# Patient Record
Sex: Female | Born: 1955
Health system: Southern US, Community
[De-identification: ages and names within clinical notes are randomized; demographics above are authoritative.]

## PROBLEM LIST (undated history)

## (undated) DIAGNOSIS — C50919 Malignant neoplasm of unspecified site of unspecified female breast: Secondary | ICD-10-CM

## (undated) DIAGNOSIS — K219 Gastro-esophageal reflux disease without esophagitis: Secondary | ICD-10-CM

## (undated) DIAGNOSIS — M5030 Other cervical disc degeneration, unspecified cervical region: Secondary | ICD-10-CM

## (undated) DIAGNOSIS — R251 Tremor, unspecified: Secondary | ICD-10-CM

## (undated) DIAGNOSIS — R0600 Dyspnea, unspecified: Secondary | ICD-10-CM

## (undated) DIAGNOSIS — Z972 Presence of dental prosthetic device (complete) (partial): Secondary | ICD-10-CM

## (undated) DIAGNOSIS — I499 Cardiac arrhythmia, unspecified: Secondary | ICD-10-CM

## (undated) DIAGNOSIS — E7800 Pure hypercholesterolemia, unspecified: Secondary | ICD-10-CM

## (undated) DIAGNOSIS — F32A Depression, unspecified: Secondary | ICD-10-CM

## (undated) DIAGNOSIS — F329 Major depressive disorder, single episode, unspecified: Secondary | ICD-10-CM

## (undated) DIAGNOSIS — F419 Anxiety disorder, unspecified: Secondary | ICD-10-CM

## (undated) DIAGNOSIS — Z8719 Personal history of other diseases of the digestive system: Secondary | ICD-10-CM

## (undated) DIAGNOSIS — G709 Myoneural disorder, unspecified: Secondary | ICD-10-CM

## (undated) DIAGNOSIS — R112 Nausea with vomiting, unspecified: Secondary | ICD-10-CM

## (undated) DIAGNOSIS — Z9889 Other specified postprocedural states: Secondary | ICD-10-CM

## (undated) DIAGNOSIS — I10 Essential (primary) hypertension: Secondary | ICD-10-CM

## (undated) DIAGNOSIS — C801 Malignant (primary) neoplasm, unspecified: Secondary | ICD-10-CM

## (undated) DIAGNOSIS — R519 Headache, unspecified: Secondary | ICD-10-CM

## (undated) DIAGNOSIS — M1990 Unspecified osteoarthritis, unspecified site: Secondary | ICD-10-CM

## (undated) DIAGNOSIS — E039 Hypothyroidism, unspecified: Secondary | ICD-10-CM

## (undated) DIAGNOSIS — D649 Anemia, unspecified: Secondary | ICD-10-CM

## (undated) HISTORY — PX: ABDOMINAL HYSTERECTOMY: SHX81

## (undated) HISTORY — PX: OTHER SURGICAL HISTORY: SHX169

## (undated) HISTORY — DX: Tremor, unspecified: R25.1

## (undated) HISTORY — DX: Anxiety disorder, unspecified: F41.9

## (undated) HISTORY — PX: WISDOM TOOTH EXTRACTION: SHX21

## (undated) HISTORY — PX: UPPER GI ENDOSCOPY: SHX6162

## (undated) HISTORY — PX: CARPAL TUNNEL RELEASE: SHX101

## (undated) HISTORY — DX: Other cervical disc degeneration, unspecified cervical region: M50.30

## (undated) HISTORY — PX: COLONOSCOPY: SHX174

## (undated) HISTORY — PX: EYE SURGERY: SHX253

---
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
## 1998-05-21 ENCOUNTER — Ambulatory Visit (HOSPITAL_COMMUNITY): Admission: RE | Admit: 1998-05-21 | Discharge: 1998-05-21 | Payer: Self-pay | Admitting: Gastroenterology

## 2001-05-02 ENCOUNTER — Ambulatory Visit (HOSPITAL_COMMUNITY): Admission: RE | Admit: 2001-05-02 | Discharge: 2001-05-02 | Payer: Self-pay | Admitting: *Deleted

## 2001-12-01 ENCOUNTER — Ambulatory Visit (HOSPITAL_COMMUNITY): Admission: RE | Admit: 2001-12-01 | Discharge: 2001-12-01 | Payer: Self-pay | Admitting: Gastroenterology

## 2003-11-24 HISTORY — PX: OTHER SURGICAL HISTORY: SHX169

## 2003-12-18 ENCOUNTER — Encounter: Admission: RE | Admit: 2003-12-18 | Discharge: 2003-12-18 | Payer: Self-pay | Admitting: Endocrinology

## 2004-04-01 ENCOUNTER — Encounter: Admission: RE | Admit: 2004-04-01 | Discharge: 2004-04-01 | Payer: Self-pay | Admitting: Specialist

## 2004-05-05 ENCOUNTER — Encounter: Admission: RE | Admit: 2004-05-05 | Discharge: 2004-05-05 | Payer: Self-pay | Admitting: Specialist

## 2004-06-13 ENCOUNTER — Encounter: Admission: RE | Admit: 2004-06-13 | Discharge: 2004-06-13 | Payer: Self-pay | Admitting: Specialist

## 2004-08-08 ENCOUNTER — Observation Stay (HOSPITAL_COMMUNITY): Admission: RE | Admit: 2004-08-08 | Discharge: 2004-08-09 | Payer: Self-pay | Admitting: Specialist

## 2004-08-08 ENCOUNTER — Encounter (INDEPENDENT_AMBULATORY_CARE_PROVIDER_SITE_OTHER): Payer: Self-pay | Admitting: Specialist

## 2005-04-02 ENCOUNTER — Encounter: Admission: RE | Admit: 2005-04-02 | Discharge: 2005-04-02 | Payer: Self-pay | Admitting: Specialist

## 2005-09-08 ENCOUNTER — Encounter: Admission: RE | Admit: 2005-09-08 | Discharge: 2005-09-08 | Payer: Self-pay | Admitting: Gastroenterology

## 2006-01-14 ENCOUNTER — Emergency Department (HOSPITAL_COMMUNITY): Admission: EM | Admit: 2006-01-14 | Discharge: 2006-01-14 | Payer: Self-pay | Admitting: Family Medicine

## 2006-09-28 ENCOUNTER — Encounter: Admission: RE | Admit: 2006-09-28 | Discharge: 2006-09-28 | Payer: Self-pay | Admitting: Specialist

## 2006-11-22 ENCOUNTER — Encounter: Admission: RE | Admit: 2006-11-22 | Discharge: 2006-11-22 | Payer: Self-pay | Admitting: Specialist

## 2006-11-23 HISTORY — PX: OTHER SURGICAL HISTORY: SHX169

## 2007-06-24 ENCOUNTER — Ambulatory Visit (HOSPITAL_BASED_OUTPATIENT_CLINIC_OR_DEPARTMENT_OTHER): Admission: RE | Admit: 2007-06-24 | Discharge: 2007-06-24 | Payer: Self-pay | Admitting: Specialist

## 2007-09-30 ENCOUNTER — Inpatient Hospital Stay (HOSPITAL_COMMUNITY): Admission: RE | Admit: 2007-09-30 | Discharge: 2007-10-04 | Payer: Self-pay | Admitting: Specialist

## 2008-07-04 ENCOUNTER — Encounter: Payer: Self-pay | Admitting: Pulmonary Disease

## 2008-07-26 ENCOUNTER — Ambulatory Visit: Payer: Self-pay | Admitting: Pulmonary Disease

## 2008-07-26 DIAGNOSIS — E039 Hypothyroidism, unspecified: Secondary | ICD-10-CM | POA: Insufficient documentation

## 2008-07-26 DIAGNOSIS — I10 Essential (primary) hypertension: Secondary | ICD-10-CM | POA: Insufficient documentation

## 2008-07-26 DIAGNOSIS — Z8669 Personal history of other diseases of the nervous system and sense organs: Secondary | ICD-10-CM | POA: Insufficient documentation

## 2008-07-26 DIAGNOSIS — R0602 Shortness of breath: Secondary | ICD-10-CM | POA: Insufficient documentation

## 2008-07-26 DIAGNOSIS — E785 Hyperlipidemia, unspecified: Secondary | ICD-10-CM | POA: Insufficient documentation

## 2008-08-03 ENCOUNTER — Ambulatory Visit: Payer: Self-pay | Admitting: Pulmonary Disease

## 2008-08-15 ENCOUNTER — Telehealth: Payer: Self-pay | Admitting: Pulmonary Disease

## 2008-08-28 ENCOUNTER — Ambulatory Visit: Payer: Self-pay

## 2008-08-28 ENCOUNTER — Encounter: Payer: Self-pay | Admitting: Pulmonary Disease

## 2008-09-03 ENCOUNTER — Ambulatory Visit: Payer: Self-pay | Admitting: Pulmonary Disease

## 2009-03-05 ENCOUNTER — Ambulatory Visit (HOSPITAL_COMMUNITY): Admission: RE | Admit: 2009-03-05 | Discharge: 2009-03-05 | Payer: Self-pay | Admitting: Specialist

## 2009-05-03 ENCOUNTER — Ambulatory Visit (HOSPITAL_BASED_OUTPATIENT_CLINIC_OR_DEPARTMENT_OTHER): Admission: RE | Admit: 2009-05-03 | Discharge: 2009-05-03 | Payer: Self-pay | Admitting: Specialist

## 2010-07-15 ENCOUNTER — Ambulatory Visit: Payer: Self-pay | Admitting: Cardiology

## 2010-07-16 ENCOUNTER — Encounter: Admission: RE | Admit: 2010-07-16 | Discharge: 2010-07-16 | Payer: Self-pay | Admitting: Cardiology

## 2010-07-16 ENCOUNTER — Ambulatory Visit: Payer: Self-pay | Admitting: Cardiology

## 2010-07-16 ENCOUNTER — Ambulatory Visit (HOSPITAL_COMMUNITY): Admission: RE | Admit: 2010-07-16 | Discharge: 2010-07-16 | Payer: Self-pay | Admitting: Cardiology

## 2010-11-18 ENCOUNTER — Ambulatory Visit
Admission: RE | Admit: 2010-11-18 | Discharge: 2010-11-18 | Payer: Self-pay | Source: Home / Self Care | Attending: Specialist | Admitting: Specialist

## 2010-12-14 ENCOUNTER — Encounter: Payer: Self-pay | Admitting: Gastroenterology

## 2010-12-23 NOTE — Assessment & Plan Note (Signed)
Summary: ov to discuss test results/mg   Referred by:  Dr. Adrian Prince PCP:  Dr. Adrian Prince  Chief Complaint:  Follow-up on ECHO and PFT.Marland Kitchen  History of Present Illness: The pt comes in today for f/u of her recent studies for evaluation of dyspnea.  Her pfts revealed no obstruction, mild restriction that I suspect is due to her obesity, and a decrease in dlco that corrected with alveolar volume adjustment.  Her cxr did show cardiomegaly, and mild increase in bronchovascular markings.  Her echo did show mild LAE, mild mitral regurgitation, normal lv fxn.  I have gone over the studies in great detail with her, and answered all of her questions.      Prior Medications Reviewed Using: Patient Recall  Current Allergies: No known allergies       Vital Signs:  Patient Profile:   55 Years Old Female Weight:      237.6 pounds O2 Sat:      98 % O2 treatment:    Room Air Temp:     98.1 degrees F oral Pulse rate:   74 / minute BP sitting:   114 / 80  (left arm)  Vitals Entered By: Michel Bickers CMA (September 03, 2008 1:37 PM)                 Physical Exam  General:     overweight female in nad      Impression & Recommendations:  Problem # 1:  DYSPNEA (ICD-786.05) I really cannot find anything obvious from a cardiopulmonary standpoint to explain her doe.  I really think it is likely related to her centripetal obesity and deconditioning.  At the same time, I have explained to the patient that testing does not always find the answer.  I have outlined two options.  The first is to take the next 2-3 mos and work on weight reduction and exercise program.  The other option is to proceed with formal cardiopulmonary stress testing.  The pt, at this point, would like to work on the former since nothing specific turned up on her testing.  However, she will contact us if things worsen or if she doesn't see improvement with appropriate behavioral changes.   Patient Instructions: 1)   work on weight loss and conditioning program.  Please call if your condition worsens, or if you do not see a response.   ]  Appended Document: Orders Update    Clinical Lists Changes  Orders: Added new Service order of Est. Patient Level III (16109) - Signed

## 2010-12-23 NOTE — Progress Notes (Signed)
Summary: LMTCBx2-results  Phone Note Call from Patient Call back at [phone]   Caller: Patient Call For: Mancel Lardizabal Summary of Call: results of pfts Initial call taken by: Rickard Patience,  August 15, 2008 10:19 AM  Follow-up for Phone Call        Pt had PFT's on 08-03-08 and would like results called to her as promised. Michel Bickers [facility]  August 15, 2008 10:29 AM  Additional Follow-up for Phone Call Additional follow up Details #1::        Let pt know that her breathing studies only show small lung volumes related to her weight.  Nothing to explain the degree of shortness of breath that she is describing.  I would like to do echo  to look at her mitral valve prolapse to see if there has been increased leaking??  Will set up echo with pcc who will call her. Additional Follow-up by: Barbaraann Share MD,  August 15, 2008 7:38 PM    Additional Follow-up for Phone Call Additional follow up Details #2::    Attempted TCB to notify pt of PFT results.  LMTCB with spouse. Cloyde Reams RN  August 16, 2008 9:25 AM  Attempted TCB.  Husband gave cell # [phone] attempted TC LMOM TCB. Cloyde Reams RN  August 16, 2008 5:11 PM   pt aware of results and knows she will be hearing from pcc soon Follow-up by: Philipp Deputy CMA,  August 17, 2008 9:53 AM

## 2010-12-23 NOTE — Assessment & Plan Note (Signed)
Summary: consult for dyspnea   Visit Type:  Initial Consult Referred by:  Dr. Adrian Prince PCP:  Dr. Adrian Prince  Chief Complaint:  SOB.  History of Present Illness: the patient is a 55 year old female who I've been asked to see for dyspnea. She is been experiencing dyspnea on exertion for the last 3-4 months, but does not feel that it is progressive in nature. She also has described some shortness of breath that can occur at rest with conversation. her shortness of breath is measured by dyspnea on exertion at approximately one block at a moderate pace. She will also get winded vacuuming one room of her house, or bringing groceries in from the car. She has an occasional cough which is primarily dry, but does not believe it is overly significant. She's not had significant lower extremity edema. The patient has never smoked, and denies childhood asthma. Overall, she is sedentary, and her weight has increased "only a few pounds" over the last one year. However, the patient admits that she never weighs herself. She denies any cardiac issues, and had a recent nuclear stress test last week that was apparently normal. She does have a history of mitral valve prolapse, but has not had a recent echocardiogram.     Current Allergies: No known allergies   Past Medical History:    Hyperlipidemia    Hypertension    Hypothyroidism    History of mitral valve prolapse  Past Surgical History:    Knee Arthroscopy    Total Knee Arthroplasty    Hysterectomy 1996    Neck and Back surgery 1994/2005       Family History:    Father had hx of heart dz  Social History:    Married, Actor    Never smoker    No ETOH   Risk Factors:  Tobacco use:  never   Review of Systems      See HPI   Vital Signs:  Patient Profile:   55 Years Old Female Weight:      224.50 pounds O2 Sat:      98 % O2 treatment:    Room Air Temp:     97.9 degrees F oral Pulse rate:   55 /  minute BP sitting:   130 / 82  (left arm)  Vitals Entered By: Vernie Murders (July 26, 2008 11:18 AM)                 Physical Exam  General:     obese female in no acute distress Eyes:     PERRLA and EOMI.   Nose:     patent without discharge Mouth:     clear Neck:     no JVD, thyromegaly, or lymphadenopathy. Lungs:     clear to auscultation Heart:     regular rate and rhythm, no MRG Abdomen:     soft and nontender, bowel sounds present Extremities:     no significant edema noted, pulses intact distally Neurologic:     alert and oriented, moves all 4 extremities.      Impression & Recommendations:  Problem # 1:  DYSPNEA (ICD-786.05) dyspnea of unknown eat origin. It is unclear whether the patient may have an occult cardiopulmonary issues, or whether this is related to her centripetal obesity and deconditioning. She does have a history of mitral valve prolapse, and I wonder if there has been increased leaking from this. I would like to start by getting  a plain chest x-ray, and also schedule her for full pulmonary function studies. I will see her back after the results are available.  Medications Added to Medication List This Visit: 1)  Bystolic 5 Mg Tabs (Nebivolol hcl) .Marland Kitchen.. 1 once daily   Patient Instructions: 1)  will schedule for breathing tests and cxr. 2)  will call with results.   ]

## 2010-12-23 NOTE — Miscellaneous (Signed)
Summary: Orders Update pft charges  Clinical Lists Changes  Orders: Added new Service order of Carbon Monoxide diffusing w/capacity (94720) - Signed Added new Service order of Lung Volumes (94240) - Signed Added new Service order of Spirometry (Pre & Post) (94060) - Signed 

## 2011-02-02 LAB — POCT I-STAT 4, (NA,K, GLUC, HGB,HCT)
Glucose, Bld: 100 mg/dL — ABNORMAL HIGH (ref 70–99)
HCT: 36 % (ref 36.0–46.0)
Hemoglobin: 12.2 g/dL (ref 12.0–15.0)
Potassium: 4.1 mEq/L (ref 3.5–5.1)
Sodium: 145 mEq/L (ref 135–145)

## 2011-02-03 ENCOUNTER — Other Ambulatory Visit: Payer: Self-pay | Admitting: Gastroenterology

## 2011-03-02 LAB — POCT I-STAT 4, (NA,K, GLUC, HGB,HCT)
Glucose, Bld: 96 mg/dL (ref 70–99)
HCT: 39 % (ref 36.0–46.0)
Hemoglobin: 13.3 g/dL (ref 12.0–15.0)
Potassium: 3.8 mEq/L (ref 3.5–5.1)
Sodium: 140 mEq/L (ref 135–145)

## 2011-04-07 NOTE — Op Note (Signed)
NAME:  Kathryn [name], TRICE NO.:  [id number]   MEDICAL RECORD NO.:  [id number]          PATIENT TYPE:  AMB   LOCATION:  [facility]                         FACILITY:  [facility]   PHYSICIAN:  Jene Every, M.D.    DATE OF BIRTH:  08/08/1956   DATE OF PROCEDURE:  06/24/2007  DATE OF DISCHARGE:                               OPERATIVE REPORT   PREOPERATIVE DIAGNOSIS:  Degenerative joint disease and degenerative  meniscus tear of the medial compartment, left knee.   POSTOPERATIVE DIAGNOSIS:  Degenerative joint disease and degenerative  meniscus tear of the medial compartment, left knee.   PROCEDURE PERFORMED:  Left knee arthroscopy, chondroplasty of the  patellofemoral sulcus, medial femoral condyle, lateral tibial plateau  and partial medial meniscectomy.   ANESTHESIA:  General.   SURGEON:  Jene Every, M.D.   ASSISTANT:  No assistant.   BRIEF HISTORY AND INDICATION:  This is a 55 year old with osteoarthritic  symptoms of the left knee.  Despite conservative treatment including  corticosteroid injection and activity modification, she had some  mechanical symptoms, indicating a possible meniscal pathology.  She was  indicated for arthroscopic debridement and discussed the risks and  benefits of the operation to include bleeding, infection, damage to  neurovascular structures, no change in symptoms, worsening symptoms,  need for repeat debridement and total knee arthroplasty in the future,  etc.   TECHNIQUE:  With the patient in supine position, after the induction of  adequate general anesthesia and 1 g of Kefzol, the left lower extremity  was prepped and draped in the usual sterile fashion.  A lateral  parapatellar portal and superomedial parapatellar portal were fashioned  with a #11 blade.  Ingress cannula was atraumatically placed.  Irrigant  was utilized to insufflate the joint.  Under direct visualization, a  medial parapatellar portal was fashioned with a #11 blade  after  localization with an 18-gauge needle, sparing the medial meniscus.  Inspection revealed extensive grade 3 changes of the patellofemoral  joint.  A shaver was introduced and utilized to perform a chondroplasty  of the patella and femoral sulcus.  There was normal tracking.  The  medial compartment revealed extensive grade 3 changes of the femoral  condyle.  There were loose cartilaginous flap tears which were debrided  with a 4.2 Cuda shaver.  As there was a degenerative tear of the  posterior third of the medial meniscus, a basket was introduced and  utilized to perform a partial medial meniscectomy to a stable base and  further contoured with a 4.2 Cuda shaver.  The remaining meniscus was  stable to probe palpation.  ACL had mild degenerative changes as well as  the PCL.  The lateral compartment revealed grade 3 changes of the tibial  plateau and the patella and of the medial femoral condyle.  Collene Mares was  introduced and utilized to perform a chondroplasty of the compartment  tear.  The meniscus was stable to probe palpation without tearing.  Gutters were unremarkable.  The knee was copiously lavaged and  reexamined the medial compartment; I felt that there was no specific  grade 4  changes to flexion and extension and visualization and probing  the femoral condyle and the tibial plateau.  Next then, the knee was  copiously lavaged and all instrumentation was removed.  Portals were  closed with 4-0 nylon simple sutures.  Marcaine 0.25% with epinephrine was infiltrated in the joint.  Wound was  dressed sterilely.  She was awoken without difficulty and transported to  the recovery room in satisfactory condition.   The patient tolerated the procedure well with no known complications.      Jene Every, M.D.  Electronically Signed     JB/MEDQ  D:  06/24/2007  T:  06/25/2007  Job:  [id number]

## 2011-04-07 NOTE — Op Note (Signed)
NAME:  Kathryn [name], KIEHN NO.:  [id number]   MEDICAL RECORD NO.:  [id number]          PATIENT TYPE:  AMB   LOCATION:  [facility]                         FACILITY:  [facility]   PHYSICIAN:  Jene Every, M.D.    DATE OF BIRTH:  07/19/1956   DATE OF PROCEDURE:  05/03/2009  DATE OF DISCHARGE:                               OPERATIVE REPORT   PREOPERATIVE DIAGNOSIS:  Cyclops lesion, right knee.   POSTOPERATIVE DIAGNOSIS:  Cyclops lesion, right knee.   PROCEDURE:  1. Examination under anesthesia.  2. Right knee arthroscopy.  3. Extensive debridement of scar tissue.  4. Synovectomy.   SURGEON:  Jene Every, M.D.   ANESTHESIA:  General.   BRIEF HISTORY/INDICATIONS:  This 55 year old, status post knee  replacement having locking, popping and giving.  Suspect a cyclops  lesion.  The patellofemoral joint x-ray showed no evidence of loosening.  There was no swelling.  She is indicated for arthroscopic evaluation and  debridement of the suspected cyclops lesion.  The risks and benefits  were discussion, including bleeding, infection, no change in symptoms,  worsening symptoms, need for repeat debridement or open debridement in  the future.   DESCRIPTION OF PROCEDURE:  With the patient in the supine position,  after being intubated and administered general anesthesia, and 2 grams  of Kefzol, the right lower extremity was prepped and draped in the usual  sterile fashion.  The superomedial portal was fashioned with a #11  blade.  The ingress cannula atraumatically placed, taking care not to  scratch the knee prosthesis.  A lateral and a medial parapatellar portal  were fashioned with a #11 blade.  This was in her pre-existing portal  markers.  Insertion of an arthroscopic camera, and a cannula for the  shaver.  Just prior to that, flexion and extension revealed some popping  in the subpatellar region.  Inspection of the suprapatellar pouch  revealed extensive fibrosis and scar  tissue. When I first introduced the  cannula, I got no fluid out of that.  There were plica noted there.  All  these were shaved with a 3.5 coude shaver.  The suprapatellar pouch, the  superior, medial and lateral aspects of the patella inferiorly, with  slight flexion and extension.  There were a couple of bands that seemed  to be inter-positioning between the prosthesis polyethylene insert and  the femoral component with flexion and extension.  This was debrided as  well.  The polyethylene insert was unremarkable.  I examined up inferior  to the patella.  Scar tissue was debrided and care was taken not to  injure the patellar tendon. I placed a #18 gauge needle beneath the  patellar tendon and stayed beneath that at all times.  The small lesion  in the inferior aspect seemed to be consistent with that, seen with a  cyclops-type lesion.  This was debrided.  Again meticulous care was  taken not to involve the patellar ligament.  After debridement flexion  and extension revealed no popping or crepitus noted.  The knee was  copiously irrigated.  We checked the gutters.  They  were unremarkable as  well.  All instrumentation was removed.  The portals were closed with #4-  0 nylon simple sutures.  Then 0.25% Marcaine with epinephrine was  infiltrated in the joint wounds.  The patient was dressed sterilely.   She was awoken without difficulty and was transported to the recovery  room in satisfactory condition.  The patient tolerated the procedure  well.  There were no complications.      Jene Every, M.D.  Electronically Signed     JB/MEDQ  D:  05/03/2009  T:  05/03/2009  Job:  [id number]

## 2011-04-07 NOTE — H&P (Signed)
NAME:  Kathryn [name], FARNER NO.:  [id number]   MEDICAL RECORD NO.:  [id number]         PATIENT TYPE:  LINP   LOCATION:                               FACILITY:  [facility]   PHYSICIAN:  Jene Every, M.D.    DATE OF BIRTH:  [birth year]   DATE OF ADMISSION:  09/30/2007  DATE OF DISCHARGE:                              HISTORY & PHYSICAL   CHIEF COMPLAINTS:  Right knee pain.   HISTORY:  Ms. Danko is well-known to our practice.  She has had a  longstanding history of off and on left knee pain.  She has undergone  multiple arthroscopies with short-term relief of her symptoms.  However,  over the past couple of months, she has noted worsening knee pain that  is disabling for her.  MRI study does show a complex tear of the  posterior horn of the medial meniscus as well as tricompartmental  degenerative changes.  We did treat the patient conservatively with  Hyalgan injections as well as a physical therapy, but unfortunately, she  had persistent pain, and it is felt at this time the patient will  benefit from a total knee arthroplasty.  The risks and benefits of this  were discussed with the patient.  She does wish to proceed.   MEDICAL HISTORY:  1. Gastroesophageal reflux disease.  2. Hypothyroidism.  3. Hypercholesterolemia.  4. Hypertension.   CURRENT MEDICATIONS:  1. Hydrocodone 5/325 p.r.n. pain.  2. Synthroid 125 mcg one p.o. daily.  3. Crestor 10 mg one p.o. daily.  4. Aciphex 20 mg one p.o. daily.  5. Bistolic 5 mg one p.o. daily.   ALLERGIES:  None.   PAST SURGICAL HISTORY:  1. Left knee arthroscopy times two.  2. Right knee arthroscopy in 2002.  3. Previous neck surgery in 1994.  4. Hysterectomy in 1996.  5. Right carpal tunnel release in 1998.  6. Left carpal tunnel release in 2000.  7. Lumbar decompression in 2005.   FAMILY HISTORY:  The patient's father is deceased at age 65 of coronary  artery disease.  Mother is 57.  She has history of diabetes as  well as  CVA.   SOCIAL HISTORY:  The patient is married.  She worse as a Print production planner.  She denies any tobacco or alcohol consumption.  She has two  children.  Husband will be her caregiver following surgery.  She lives  in a one-story home with three outdoor steps.  Primary care physician is  Dr. Adrian Prince.   REVIEW OF SYSTEMS:  GENERAL:  The patient denies any fever, chills,  night sweats or bleeding tendencies.  CNS: No blurred or double vision,  seizure, headache or paralysis.  RESPIRATORY: No shortness of breath,  productive cough or hemoptysis.  CARDIOVASCULAR:  No chest pain, angina  or orthopnea.  GU: No dysuria, hematuria or discharge.  GI: No nausea,  vomiting, diarrhea, constipation, melena or bloody stools.  MUSCULOSKELETAL:  As pertinent in the HPI.   PHYSICAL EXAMINATION:  VITAL SIGNS:  Pulse 70, respirations 12, BP  146/90 in the right arm.  GENERAL:  This well-nourished female seen  upright in no acute distress.  HEENT: Atraumatic, normocephalic.  Pupils  equal round and reactive to light.  EOMs intact.  NECK:  Supple.  No lymphadenopathy.  CHEST:  Clear to auscultation bilaterally.  No rhonchi, wheezes or  rales.  BREASTS/GU:  Not pertinent to HPI.  HEART:  Regular rate and rhythm without murmurs, gallops or rubs.  ABDOMEN:  Soft, nontender, nondistended.  Bowel sounds time four.  SKIN:  No rashes or lesions are noted.  LOWER EXTREMITY:  The patient has mild effusion in the right knee.  She  is tender along the medial joint line.  There is mild crepitus on exam.  She is also having pain in the left knee which shows mild diffusion with  tenderness in the medial joint line.   IMPRESSION:  End-stage tricompartmental arthritis, right knee.   PLAN:  The patient will be admitted to [facility] to undergo  right total knee arthroplasty.      Roma Schanz, P.A.      Jene Every, M.D.  Electronically Signed    CS/MEDQ  D:  09/23/2007   T:  09/23/2007  Job:  [id number]

## 2011-04-07 NOTE — Op Note (Signed)
NAMERISHIKA, MCCOLLOM NO.:  [id number]   MEDICAL RECORD NO.:  [id number]          PATIENT TYPE:  INP   LOCATION:  1616                         FACILITY:  [facility]   PHYSICIAN:  Jene Every, M.D.    DATE OF BIRTH:  26-Jul-1956   DATE OF PROCEDURE:  DATE OF DISCHARGE:                               OPERATIVE REPORT   PREOPERATIVE DIAGNOSIS:  Degenerative joint disease, right knee.   POSTOPERATIVE DIAGNOSIS:  Degenerative joint disease, right knee.   PROCEDURE:  Right total knee arthroplasty, DePuy rotating platform, 3  femur, 3 tibia, 12.5-mm insert, 35 patella.   ANESTHESIA:  General.   SURGEON:  Jene Every, M.D.   ASSISTANT:  Roma Schanz, P.A.   BRIEF HISTORY AND INDICATIONS:  This is a 55 year old female with end-  stage osteoarthrosis of the right knee status post multiple arthroscopic  debridements, persistent refractory pain.  Significant joint space  narrowing was noted medially and laterally.  Operative intervention was  indicated to replace the degenerated joint.  The risks and benefits were  discussed, including bleeding, infection, damage to neurovascular  structures, suboptimal range of motion, component failure, instability,  arthrofibrosis, need for manipulation and revision, DVT, PE, anesthetic  complications, etc.   TECHNIQUE:  With the patient in the supine position after induction of  adequate general anesthesia, 2 grams Kefzol, the right lower extremity  was prepped and draped and exsanguinated in the usual sterile fashion.  Thigh tourniquet inflated to 325 mmHg.   Midline anterior incision was made over the knee.  The subcutaneous  tissue was dissected.  Electrocautery was used to supplement hemostasis.  Median patellar arthrotomy was performed.  Clear synovial fluid was  evacuated.  The patella was everted and the knee was flexed.  Tricompartmental severe osteoporosis was noted with significant  osteophytic spurring.  It was  removed with the rongeur.  Step drill was  utilized to enter the femoral canal.  This was irrigated.  The T-cannula  was introduced.  A 5-degree right intramedullary guide was inserted.  The distal femoral cutting block was then applied.  10 mm was chosen.  The soft tissues were well-protected.  The oscillating saw was utilized  to perform the distal femoral cut.  We then sized the femur, exposing  the anterior cortex.  This sized to a 3.  This was then secured, pinned,  and the distal femoral cutting block was applied.  We used the  appropriate 3-degree rotation.  With this clamp in a curved  _crego_________, protecting the posterior elements, we performed the  anterior and posterior cuts and then the chamfer cuts.  The [facility] rongeur  was utilized to remove the residual osteophytes.  Prior to this, a  remnant of the ACL was removed and the portion of the anterolateral  remaining menisci.  Following this, we flexed the and subluxed the tibia  with a Crego and removed the remaining portion of the medial and lateral  menisci, cauterizing the geniculate.  There was extreme attenuation of  the popliteus tendon.  Next, the tibia was sized basically to a 3.  The  external  alignment guide was placed just medial to the tibial tubercle 4  mm off the defect which was laterally and parallel to the tibia and the  distal portion bisecting the ankle.  This was then pinned and again the  posterior elements well-protected with McHales.  We used an oscillating  saw to perform the tibial cut.  Next, the flexion/extension block was  evaluated.  I felt that it was optimal with the 12.5 mm spacer block in  extension and in flexion.  There was an equivalent gap noted and no  instability with varus valgus stressing at 90 or  at 0 degrees.  We then  returned to the tibia with the knee flexed.  We sized the tibia which  was optimal at the 3 in the appropriate rotation.  We tried a 4 which  seemed to be too large  and overhanging.  The 3 was then selected which  seemed to have good peripheral cortical fit in the appropriate rotation.  This was then pinned, and the central drill was utilized and then the  punch guide was utilized as well.  We then used the box cut guide for  the distal femur and lined it with the tibial tray as well.  She had a  slightly hypertrophic area on the medial femoral condyle.  We dissected  the notch in the intramedullary canal.  We pinned this, and then again  protecting the posterior elements using the small saw to perform the  anterior posterior cut, and then leaving the saw as protection, used an  oscillating saw to perform the cephalad caudad cut.  The block was then  removed.  This was rasped.  We put a trial femur in which fit  satisfactorily.  We put a trial insert in the tibia.  She had full  extension, full flexion.  She had no instability, no anterior drawer.  The patella was then everted and measured at 25 mm in thickness.  We  selected the 35.  We then used the patellar planing after removing  osteophytes from the patella and removed 8.5 mm thickness of the  patella.  The remaining thickness of the patella was 16.  We then placed  the plate, medializing the drill holes for the patella, drilling them.  We then trialed the patella and it was found to seat satisfactorily, and  with the other component, tracked appropriately with the tibia and the  femoral components.  A minor release of the lateral tissues was  performed.  Next, all of the trials were then removed and the posterior  area inspected.  There were small loose bodies noted in the posterior  aspect of the joint.  These were removed, approximately a centimeter in  diameter.  We irrigated the knee with pulsatile lavage.  Following this,  we placed down a clean drape, flexed the knee, and placed our McHale's,  and dried the bony surfaces.  Then, cement was mixed in the appropriate  fashion.  We then  injected it down to the tibia, pressing it into the  interstitium of the bone.  We placed the 3 tibial component and impacted  it in place, with redundant cement removed.  We placed the insert, and  then we placed the femoral component after cementing it, putting cement  on the runners and putting Gelfoam up into the intramedullary canal.  This was impacted in place and redundant cement removed.  We placed a  12.5 insert, inserted it and brought it out  into full extension, axial  load applied and redundant cement removed.  Until a curing of the cement  occurred, we clamped the patella with the patellar clamp, redundant  cement removed.  After appropriate curing of the cement, the insert was  then removed.  We removed all redundant cement with an osteotome and  pickups.  The wound copiously irrigated.  We then flexed the knee, used  a Crego, placed 12.5 permanent insert.  We had full extension, full  flexion, no evidence of instability in varus or valgus stressing at 0  and 30 degrees.  Good tracking of the patella was noted.  We placed a  Hemovac and brought it out through a lateral stab wound in the skin.  We  then copiously irrigated with pulsatile lavage.  We repaired the  parapatellar arthrotomy with #1 Vicryl interrupted figure-of-eight  sutures.  The subcutaneous tissue was reapproximated with 0 and 2-0  Vicryl simple sutures.  The skin was reapproximated with staples.  She  was closed in slight flexion.  After closure, we held the thigh up.  She  had 90 degrees against gravity.  Again, full extension, full flexion,  negative anterior drawer with knee in flexion.  The wound was dressed  sterilely and secured with an Ace bandage.  After 2 hours, the  tourniquet was released.  There was adequate vascularization of the  lower extremity appreciated.   The patient tolerated the procedure well.  There were no complications.      Jene Every, M.D.  Electronically Signed      JB/MEDQ  D:  09/30/2007  T:  09/30/2007  Job:  [id number]

## 2011-04-10 NOTE — Op Note (Signed)
NAME:  Kathryn [name], UNREIN NO.:  [id number]   MEDICAL RECORD NO.:  [id number]                   PATIENT TYPE:  AMB   LOCATION:  DAY                                  FACILITY:  [facility]   PHYSICIAN:  Jene Every, M.D.                 DATE OF BIRTH:  [birth year]   DATE OF PROCEDURE:  08/08/2004  DATE OF DISCHARGE:                                 OPERATIVE REPORT   PREOPERATIVE DIAGNOSIS:  Spinal stenosis, L4-5, left.   POSTOPERATIVE DIAGNOSES:  1.  Spinal stenosis, L4-5, left.  2.  Synovial cyst, lipoma, left L4-5 facet.   PROCEDURE PERFORMED:  Lateral recess decompression, partial medial  hemifacetectomy, L4-5.  Excision of synovial cyst, lipoma.  Foraminotomy of  L4-5.   ANESTHESIA:  General.   ASSISTANT:  Roma Schanz, P.A.   BRIEF HISTORY/INDICATIONS:  This is a 55 year old with left lower extremity  radicular pain in the L5 nerve root distribution and positive neural tension  signs and EHL weakness.  MRI indicating mild degenerative changes at L4-5  and L5-S1.  Due to the persistence of her symptoms, we obtained a flexion  and extension lateral CT myelogram which demonstrated in the lateral  position, L5 nerve root cut-off.  Operative intervention is indicated for  decompression of that lateral recess.  Risks and benefits were discussed,  including bleeding, infection, damage to vascular structures, CSF leakage,  epidural fibrosis, adjacent segment disease, need for fusion in the future,  no change in symptoms, etc.   TECHNIQUE:  After being placed in the supine position, after adequate  general anesthesia and 1 gm of Kefzol, she was placed prone on the Andrews  frame while all bony prominences were well padded.  The lumbar area was  prepped and draped in the usual sterile fashion.  An 18 gauge spinal needle  was utilized to localize the 4-5 interspace and confirmed with x-ray.  An  incision was made from the spinous process of L4-5.   Subcutaneous tissue was  dissected.  Electrocautery was utilized to achieve hemostasis.  The dorsal  lumbar fascia was identified and divided in line with the skin incision.  The paraspinous muscle was elevated from the lamina at L4-5.  The McCullough  retractor was placed.  The operating microscope was draped and brought into  the surgical field.  Noting first at the L4-5 facet was a fairly large mass  containing a synovial cyst and lipoma, this was removed with pituitary,  incorporating a good portion of the facet.  This was sent to pathology for  appropriate analysis.  Next, detached the ligamentum flavum on the cephalad  edge of 4 and the caudad edge of 5, utilizing a 2-3 mm Kerrison.  We  detached the cephalad edge of the ligamentum, utilizing this hemilaminotomy.  The caudad edge was performed as well, noting the lateral recess was  ligamentum flavum hypertrophy and a spur off the  medial portion of the  superior articulating facet of 5.  This was decompressed with a 2 mm and a 3  mm Kerrison, performing lateral recess decompression and a foraminotomy.  Bipolar electrocautery was then utilized to achieve hemostasis.  This is  felt to be the intervening pathology.  After the lateral recess was  decompressed and then mobilizing the L5 nerve root medially, there was an  extensive epidural venous plexus, and electrocautery was then utilized to  achieve hemostasis.  Full examination of the disk revealed no evidence of  herniated material.  I checked also beneath the thecal sac and as well in  the axilla of the 5 nerve root.  A probe was then placed in the foramen at 4  and 5 found to be widely patent following decompression.  There was good  excursion of the 5 root medial to the pedicle without compression.   The wound was then copiously irrigated with inspection being done.  No CSF  leakage or active bleeding.  Thrombin-soaked Gelfoam was placed in the  laminectomy defect.  Pelvic  retractors were removed.  Paraspinous muscles  were inspected.  No evidence of active bleeding.  Dorsal lumbar fascia  was  reapproximated with 0 Vicryl interrupted figure-of-eight suture.  Subcutaneous tissue was reapproximated with 2-0 Vicryl simple sutures.  The  skin was reapproximated with a 4-0 subcuticular Prolene.  The wound was  reinforced with Steri-Strips.  Sterile dressing was applied.  She was placed  supine on the hospital bed, extubated without difficulty, and transported to  the recovery room in satisfactory condition.   The patient tolerated the procedure well with no complications.  Minimal  blood loss.   SPECIMENS:  Synovial cyst from the left L4-5 facet.      JB/MEDQ  D:  08/08/2004  T:  08/08/2004  Job:  [id number]

## 2011-04-10 NOTE — Discharge Summary (Signed)
[name], [name] NO.:  [id number]   MEDICAL RECORD NO.:  [id number]          PATIENT TYPE:  INP   LOCATION:  1616                         FACILITY:  [facility]   PHYSICIAN:  Jene Every, M.D.    DATE OF BIRTH:  [birth year]   DATE OF ADMISSION:  09/30/2007  DATE OF DISCHARGE:  10/04/2007                               DISCHARGE SUMMARY   ADMISSION DIAGNOSES:  1. Degenerative joint disease right knee.  2. Gastroesophageal reflux disease.  3. Hypothyroidism.  4. Hypercholesterolemia.  5. Hypertension.   DISCHARGE DIAGNOSES:  1. Degenerative joint disease right knee.  2. Gastroesophageal reflux disease.  3. Hypothyroidism.  4. Hypercholesterolemia.  5. Hypertension.  6. Status post right total knee arthroplasty.  7. Asymptomatic postoperative anemia.   HISTORY OF PRESENT ILLNESS:  Ms. Kathryn [name] is a well-known patient to our  practice.  She has a longstanding history of off and on right knee pain.  She has undergone multiple arthroscopies with short-term relief of her  symptoms.  However, she has developed worsening pain over the past  several months.  MRI studies do show complex tear of the posterior horn  of the medial meniscus as well as tricompartmental degenerative changes.  We initially tried to treat this conservatively with Hyalgan as well as  physical therapy, but unfortunately the patient had disabling symptoms.  Therefore, we elected to proceed with a total knee arthroplasty.   CONSULTATIONS:  PT, OT, case management.   PROCEDURES:  The patient was taken to the OR on September 30, 2007 and  underwent a right total knee arthroplasty.  Surgeon Dr. Jene Every,  assistant Roma Schanz, P.A.C.  Anesthesia general.  Complications  none.   LABORATORY VALUES:  Preoperative H&H showed hemoglobin 12, hematocrit  35.  These were followed throughout the hospital course.  White cell  count remained normal.  Hemoglobin did drop to a level of 8.5,  hematocrit 25.5.  However, the patient remained asymptomatic.  At time  of discharge hemoglobin 8.8, hematocrit 25.4.  Coagulation studies done  preoperatively within normal range.  The patient was placed on Coumadin  postoperatively.  At time of discharge she was therapeutic with INR 2.  Routine chemistries done preoperatively were all within normal range,  slightly elevated glucose at 109.  These were monitored throughout the  hospital course.  Sodium, potassium remain normal at time of discharge.  Sodium 137, potassium 3.9, glucose slightly elevated at 110, BUN  decreased at 5, creatinine remained normal at 0.69.  Blood type is A  negative.  Preoperative EKG showed normal sinus rhythm, incomplete right  bundle branch block, nonspecific ST abnormality.  Preoperative chest x-  ray showed moderate cardiomegaly.  No active lung disease.   HOSPITAL COURSE:  The patient was admitted, taken to the OR, and  underwent the above stated procedure without difficulty.  When Hemovac  drain was placed intraoperatively she was transferred to the PACU.  Placed on PCA for pain relief and then transferred to the orthopedic  floor to continue postoperative care.  Postop day #1 the patient was  doing fairly well.  Pain  in lower extremity as expected.  No chest pain.  Coumadin was continued.  Calf soft, nontender with no evidence of DVT.  CPM was continued.  Postop day #2 the patient continued to do fairly  well.  Hemovac was removed. Hemoglobin was 9.8.  However, the patient  denied any nausea or dizziness.  Physical therapy was continued.  The  patient continued to do fairly well.  Hemoglobin did drop on postop day  #3 to 8.5, but again the patient was asymptomatic.  Incision was healing  well without evidence of infection.  Calves remained soft, nontender.  PT, OT was continued.  Coumadin was continued per pharmacy.  Currently  INR 1.7.  On postop day #5 the patient was doing well.  Pain was  controlled.   She was advancing well with her therapy.  Hemoglobin had  improved to 8.8, hematocrit 25.4.  Again the patient's vital signs were  stable.  She was afebrile.  She denied any nausea or dizziness.  Incision was clean, dry, and intact.  Motor neurovascularly intact to  the lower extremity, calf soft, nontender.  It was felt the patient was  stable to be discharged home.   DISPOSITION:  The patient discharged home with home health PT/OT.  She  is to follow with Dr. Shelle Iron in approximately 10-14 days, to change her  dressing daily, and to keep this area clean and dry.   ACTIVITY:  She is to increase activity slowly.  She is to walk with her  walker, continue to use her knee immobilizer until she can straight leg  raise to help her to go upstairs.  No driving for a week.  She may  shower in approximately 72 hours.  She is to elevate her right lower  extremity six times a day for 20 minutes at a time, to control swelling.   DISCHARGE MEDICATIONS:  Includes all home medications, Vitamin C 500 mg  daily, Coumadin 5 mg one daily, Vicodin 1-2 p.o. q.4-6 p.r.n. pain,  Robaxin 500 mg one p.o. q.6-8 p.r.n. spasm, iron supplement t.i.d.   Home health is to check PT/INR as well as hemoglobin.   DIET:  Is as tolerated.   CONDITION ON DISCHARGE:  Stable.   FINAL DIAGNOSIS:  Degenerative joint disease, status post right total  knee arthroplasty.      Roma Schanz, P.A.      Jene Every, M.D.  Electronically Signed    CS/MEDQ  D:  10/28/2007  T:  10/28/2007  Job:  [id number]

## 2011-04-10 NOTE — Procedures (Signed)
[facility]  Patient:    Kathryn [name], Kathryn [name] Visit Number: [id number] MRN: [id number]          Service Type: END Location: ENDO Attending Physician:  Orland Mustard Dictated by:   Llana Aliment. Randa Evens, M.D. Proc. Date: 11/25/01 Admit Date:  12/01/2001   CC:         Jeannett Senior A. Evlyn Kanner, M.D.   Procedure Report  PROCEDURE PERFORMED:  Colonoscopy.  ENDOSCOPIST:  Llana Aliment. Randa Evens, M.D.  MEDICATIONS USED:  Fentanyl 140 mcg, Versed 10 mg IV.  INSTRUMENT:  INDICATIONS:  Heme positive stool in a young woman with a positive family history of colon polyps.  DESCRIPTION OF PROCEDURE:  The procedure had been explained to the patient and consent obtained.  With the patient in the left lateral decubitus position, a digital exam was performed and the pediatric Olympus video colonoscope was inserted and advanced under direct visualization.  The prep was marginal.  We were able to see quite well.  A very small polyp possibly could have been missed.  We were able to advance to the cecum and the ileocecal valve.  The ileocecal valve and appendiceal orifice were seen well.  The scope was withdrawn.  The cecum, ascending colon, hepatic flexure, transverse colon, splenic flexure, descending and sigmoid colon were seen well. No polyps or other lesions were seen.  Scope withdrawn, there was no evidence of polyps in the rectum or any proctitis.  The patient tolerated the procedure well. ASSESSMENT:  Heme positive stool no obvious finding on colonoscopy in this woman with a positive family history of colon polyps.  PLAN:  Will see back in the office in two months and recheck her stool due to her family history, recommend repeating in five years. Dictated by:   Llana Aliment. Randa Evens, M.D. Attending Physician:  Orland Mustard DD:  12/01/01 TD:  12/01/01 Job: [id number]

## 2011-05-13 ENCOUNTER — Other Ambulatory Visit: Payer: Self-pay | Admitting: Gastroenterology

## 2011-09-01 LAB — BASIC METABOLIC PANEL
BUN: 11
BUN: 5 — ABNORMAL LOW
BUN: 7
BUN: 8
CO2: 26
CO2: 28
CO2: 29
CO2: 30
Calcium: 8.3 — ABNORMAL LOW
Calcium: 8.4
Calcium: 8.8
Calcium: 9.3
Chloride: 101
Chloride: 102
Chloride: 103
Chloride: 103
Creatinine, Ser: 0.69
Creatinine, Ser: 0.82
Creatinine, Ser: 0.86
Creatinine, Ser: 0.88
GFR calc Af Amer: >60
GFR calc Af Amer: >60
GFR calc Af Amer: >60
GFR calc Af Amer: >60
GFR calc non Af Amer: >60
GFR calc non Af Amer: >60
GFR calc non Af Amer: >60
GFR calc non Af Amer: >60
Glucose, Bld: 109 — ABNORMAL HIGH
Glucose, Bld: 110 — ABNORMAL HIGH
Glucose, Bld: 116 — ABNORMAL HIGH
Glucose, Bld: 149 — ABNORMAL HIGH
Potassium: 3.7
Potassium: 3.8
Potassium: 3.9
Potassium: 4
Sodium: 137
Sodium: 137
Sodium: 138
Sodium: 141

## 2011-09-01 LAB — CBC
HCT: 25.4 — ABNORMAL LOW
HCT: 25.5 — ABNORMAL LOW
HCT: 27.5 — ABNORMAL LOW
HCT: 28.9 — ABNORMAL LOW
Hemoglobin: 8.5 — ABNORMAL LOW
Hemoglobin: 8.8 — ABNORMAL LOW
Hemoglobin: 9.1 — ABNORMAL LOW
Hemoglobin: 9.8 — ABNORMAL LOW
MCHC: 33.2
MCHC: 33.4
MCHC: 33.8
MCHC: 34.6
MCV: 83.6
MCV: 84.7
MCV: 85.1
MCV: 85.3
Platelets: 183
Platelets: 199
Platelets: 202
Platelets: 256
RBC: 3.01 — ABNORMAL LOW
RBC: 3.04 — ABNORMAL LOW
RBC: 3.22 — ABNORMAL LOW
RBC: 3.4 — ABNORMAL LOW
RDW: 15.6 — ABNORMAL HIGH
RDW: 16.2 — ABNORMAL HIGH
RDW: 16.2 — ABNORMAL HIGH
RDW: 16.3 — ABNORMAL HIGH
WBC: 10.1
WBC: 6.4
WBC: 7.6
WBC: 9.4

## 2011-09-01 LAB — PROTIME-INR
INR: 1.1
INR: 1.5
INR: 1.7 — ABNORMAL HIGH
INR: 2 — ABNORMAL HIGH
Prothrombin Time: 14.1
Prothrombin Time: 18.2 — ABNORMAL HIGH
Prothrombin Time: 20.8 — ABNORMAL HIGH
Prothrombin Time: 22.9 — ABNORMAL HIGH

## 2011-09-01 LAB — ABO/RH: ABO/RH(D): A NEG

## 2011-09-01 LAB — TYPE AND SCREEN
ABO/RH(D): A NEG
Antibody Screen: NEGATIVE

## 2011-09-01 LAB — HEMOGLOBIN AND HEMATOCRIT, BLOOD
HCT: 35 — ABNORMAL LOW
Hemoglobin: 12

## 2011-09-07 LAB — POCT HEMOGLOBIN-HEMACUE
Hemoglobin: 13.8
Operator id: 268271

## 2011-11-26 ENCOUNTER — Other Ambulatory Visit: Payer: Self-pay | Admitting: Otolaryngology

## 2012-01-01 ENCOUNTER — Encounter (HOSPITAL_COMMUNITY): Payer: Self-pay | Admitting: Pharmacy Technician

## 2012-01-01 NOTE — H&P (Signed)
Chief Complaint:  left knee pain.  Subjective: Patient is admitted for left total knee arthroplasty.  Patient is a 56 y.o. female who has been seen by Dr. Shelle Iron for ongoing hip pain.  They have been followed and found to have continued progressive pain.  They have been treated conservatively in the past including medications.  Despite conservative measures, they continue to have pain.  X-rays show that the patient has bone on bone OA of the left knee.  It is felt that they would benefit from undergoing surgical intervention.  Risks and benefits have been discussed with the patient and they elect to proceed with surgery.  The patient has no contraindications to the upcoming procedure such as ongoing infection or progressive neurological disease.  Allergies: NKDA    Medications: Synthroid ( Tablet, Oral) Active. Sucralfate (1GM Tablet, Oral) Active. Bystolic (10MG  Tablet, Oral) Active. Pristiq (50MG  Tablet ER 24HR, Oral) Active. Norco (5-325MG  Tablet, 1 (one) Oral every 6-8 hours as needed for pain, Taken starting 11/09/2011) Active   Past Medical History: chronic Ulcerative Colitis Gastroesophageal Reflux Disease High blood pressure Hypercholesterolemia Hyperthyroidism  Past Surgical History:  Arthroscopic Knee Surgery - Left. and rt Neck Disc Surgery Hysterectomy (not due to cancer) - Complete Carpal Tunnel Surgery - Both Arthroscopic Shoulder Surgery - Left Discectomy. l- spine Total Knee Replacement - Right Arthroscopy of Knee. bilateral Arthroscopy of Shoulder. left Carpal Tunnel Repair. bilateral Hysterectomy. complete (non-cancerous) Spinal Fusion. neck and lower back Spinal Surgery Total Knee Replacement. right  Family History: Cerebrovascular Accident. mother Congestive Heart Failure. mother, father, grandmother mothers side and grandfather fathers side Heart Disease. grandfather mothers side Hypertension. mother Osteoporosis. mother  Social  History: Tobacco use. Never smoker. never smoker Alcohol use. current drinker; drinks wine; only occasionally per week Children. 2 Current work status. working full time Drug/Alcohol Rehab (Currently). no Exercise. Exercises never; does other Illicit drug use. no Living situation. live with spouse Marital status. married Most recent primary occupation. Funeral Director Number of flights of stairs before winded. 1 Pain Contract. no Previously in rehab. no Tobacco / smoke exposure. no  Review of Systems A comprehensive review of systems was negative.  Physical Exam:  Vitals: Pulse:76 Respirations:10 Blood Pressure:150/100  GENERAL: Patient is a 55 y.o. female, well-nourished, well-developed, no acute distress. Alert, oriented, cooperative. HENT:  Normocephalic, atraumatic. Pupils round and reactive. EOMs intact. NECK:  Supple, no bruits. CHEST:  Clear to anterior and posterior chest walls. No rhonchi, rales, wheezes. HEART:  Regular, rate and rhythm.  No murmurs.  S1 and S2 noted. ABDOMEN:  Soft, nontender, bowel sounds present. RECTAL/BREAST/GENITALIA:  Not done, not pertinent to present illness. EXTREMITIES:  Mild effusion left knee, ROM-10 to 90, TTP MJL, moderate crepitus on exam    Assessment/Plan: End stage arthritis, left knee  The patient is being admitted to [facility] to undergo a left total knee arthroplasty.  Surgery will be performed by Dr. Jene Every.  Risks and benefits have been discussed with the patient and they elect to proceed wth the procedure.

## 2012-01-05 ENCOUNTER — Ambulatory Visit (HOSPITAL_COMMUNITY)
Admission: RE | Admit: 2012-01-05 | Discharge: 2012-01-05 | Disposition: A | Discharge status: Home / Self Care | Payer: 59 | Source: Ambulatory Visit | Attending: Specialist | Admitting: Specialist

## 2012-01-05 ENCOUNTER — Encounter (HOSPITAL_COMMUNITY)
Admission: RE | Admit: 2012-01-05 | Discharge: 2012-01-05 | Disposition: A | Discharge status: Home / Self Care | Payer: 59 | Source: Ambulatory Visit | Attending: Specialist | Admitting: Specialist

## 2012-01-05 ENCOUNTER — Other Ambulatory Visit: Payer: Self-pay

## 2012-01-05 ENCOUNTER — Ambulatory Visit (HOSPITAL_COMMUNITY)
Admission: RE | Admit: 2012-01-05 | Discharge: 2012-01-05 | Disposition: A | Discharge status: Home / Self Care | Payer: 59 | Source: Ambulatory Visit | Attending: Otolaryngology | Admitting: Otolaryngology

## 2012-01-05 ENCOUNTER — Encounter (HOSPITAL_COMMUNITY): Payer: Self-pay

## 2012-01-05 DIAGNOSIS — I771 Stricture of artery: Secondary | ICD-10-CM | POA: Insufficient documentation

## 2012-01-05 DIAGNOSIS — M1710 Unilateral primary osteoarthritis, unspecified knee: Secondary | ICD-10-CM | POA: Insufficient documentation

## 2012-01-05 DIAGNOSIS — Z01812 Encounter for preprocedural laboratory examination: Secondary | ICD-10-CM | POA: Insufficient documentation

## 2012-01-05 DIAGNOSIS — Z01810 Encounter for preprocedural cardiovascular examination: Secondary | ICD-10-CM | POA: Insufficient documentation

## 2012-01-05 DIAGNOSIS — M25469 Effusion, unspecified knee: Secondary | ICD-10-CM | POA: Insufficient documentation

## 2012-01-05 HISTORY — DX: Essential (primary) hypertension: I10

## 2012-01-05 HISTORY — DX: Gastro-esophageal reflux disease without esophagitis: K21.9

## 2012-01-05 HISTORY — DX: Other specified postprocedural states: Z98.890

## 2012-01-05 HISTORY — DX: Other specified postprocedural states: R11.2

## 2012-01-05 HISTORY — DX: Personal history of other diseases of the digestive system: Z87.19

## 2012-01-05 HISTORY — DX: Pure hypercholesterolemia, unspecified: E78.00

## 2012-01-05 LAB — URINALYSIS, ROUTINE W REFLEX MICROSCOPIC
Bilirubin Urine: NEGATIVE
Glucose, UA: NEGATIVE mg/dL
Hgb urine dipstick: NEGATIVE
Ketones, ur: NEGATIVE mg/dL
Leukocytes, UA: NEGATIVE
Nitrite: NEGATIVE
Protein, ur: NEGATIVE mg/dL
Specific Gravity, Urine: 1.018 (ref 1.005–1.030)
Urobilinogen, UA: 0.2 mg/dL (ref 0.0–1.0)
pH: 6.5 (ref 5.0–8.0)

## 2012-01-05 LAB — CBC
HCT: 36 % (ref 36.0–46.0)
Hemoglobin: 11.8 g/dL — ABNORMAL LOW (ref 12.0–15.0)
MCH: 28.2 pg (ref 26.0–34.0)
MCHC: 32.8 g/dL (ref 30.0–36.0)
MCV: 86.1 fL (ref 78.0–100.0)
Platelets: 268 10*3/uL (ref 150–400)
RBC: 4.18 MIL/uL (ref 3.87–5.11)
RDW: 15 % (ref 11.5–15.5)
WBC: 5.7 10*3/uL (ref 4.0–10.5)

## 2012-01-05 LAB — DIFFERENTIAL
Basophils Absolute: 0 10*3/uL (ref 0.0–0.1)
Basophils Relative: 0 % (ref 0–1)
Eosinophils Absolute: 0.1 10*3/uL (ref 0.0–0.7)
Eosinophils Relative: 2 % (ref 0–5)
Lymphocytes Relative: 23 % (ref 12–46)
Lymphs Abs: 1.3 10*3/uL (ref 0.7–4.0)
Monocytes Absolute: 0.3 10*3/uL (ref 0.1–1.0)
Monocytes Relative: 6 % (ref 3–12)
Neutro Abs: 3.9 10*3/uL (ref 1.7–7.7)
Neutrophils Relative %: 69 % (ref 43–77)

## 2012-01-05 LAB — PROTIME-INR
INR: 0.91 (ref 0.00–1.49)
Prothrombin Time: 12.5 seconds (ref 11.6–15.2)

## 2012-01-05 LAB — SURGICAL PCR SCREEN
MRSA, PCR: NEGATIVE
Staphylococcus aureus: POSITIVE — AB

## 2012-01-05 NOTE — Patient Instructions (Addendum)
20 MAISEY DEANDRADE  01/05/2012   Your procedure is scheduled on: 01-14-2012  Report to [facility] at  0530 AM.  Call this number if you have problems the morning of surgery: [phone]   Remember:   Do not eat food or drink liquids:After Midnight.  Take these medicines the morning of surgery with A SIP OF WATER: synthroid. bystolic   Do not wear jewelry...  Do not wear lotions, powders, or perfumes. Do not wear deodorant.  .  Do not bring valuables to the hospital.  Contacts, dentures or bridgework may not be worn into surgery.  Leave suitcase in the car. After surgery it may be brought to your room.  For patients admitted to the hospital, checkout time is 11:00 AM the day of discharge.     Special Instructions: CHG Shower Use Special Wash: 1/2 bottle night before surgery and 1/2 bottle morning of surgery.neck down avoid private area, do not shave 2 days before showers   Please read over the following fact sheets that you were given: MRSA Information  Jasmine December Kiree Dejarnette rn [facility] pre op nurse phone number [phone] call if needed

## 2012-01-05 NOTE — H&P (Signed)
ok 

## 2012-01-05 NOTE — Pre-Procedure Instructions (Addendum)
bmet and liver fuction 12-25-11 dr Evlyn Kanner on chart Echo 07-16-10 dr Patty Sermons on chart

## 2012-01-13 NOTE — Anesthesia Preprocedure Evaluation (Signed)
Anesthesia Evaluation  Patient identified by MRN, date of birth, ID band Patient awake    Reviewed: Allergy & Precautions, H&P , NPO status , Patient's Chart, lab work & pertinent test results, reviewed documented beta blocker date and time   History of Anesthesia Complications (+) PONV  Airway Mallampati: II TM Distance: >3 FB Neck ROM: full    Dental No notable dental hx.    Pulmonary neg pulmonary ROS, shortness of breath and with exertion,  clear to auscultation  Pulmonary exam normal       Cardiovascular hypertension, Pt. on home beta blockers regular Normal    Neuro/Psych Negative Neurological ROS  Negative Psych ROS   GI/Hepatic negative GI ROS, Neg liver ROS, hiatal hernia, GERD-  ,  Endo/Other  Negative Endocrine ROSHypothyroidism   Renal/GU negative Renal ROS  Genitourinary negative   Musculoskeletal   Abdominal   Peds  Hematology negative hematology ROS (+)   Anesthesia Other Findings   Reproductive/Obstetrics negative OB ROS                           Anesthesia Physical Anesthesia Plan  ASA: II  Anesthesia Plan: General   Post-op Pain Management:    Induction: Intravenous  Airway Management Planned: Oral ETT  Additional Equipment:   Intra-op Plan:   Post-operative Plan: Extubation in OR  Informed Consent: I have reviewed the patients History and Physical, chart, labs and discussed the procedure including the risks, benefits and alternatives for the proposed anesthesia with the patient or authorized representative who has indicated his/her understanding and acceptance.   Dental Advisory Given  Plan Discussed with: CRNA and Surgeon  Anesthesia Plan Comments:         Anesthesia Quick Evaluation

## 2012-01-14 ENCOUNTER — Encounter (HOSPITAL_COMMUNITY): Payer: Self-pay | Admitting: Anesthesiology

## 2012-01-14 ENCOUNTER — Encounter (HOSPITAL_COMMUNITY)
Admission: RE | Disposition: A | Discharge status: Home Health | Payer: Self-pay | Source: Ambulatory Visit | Attending: Specialist

## 2012-01-14 ENCOUNTER — Inpatient Hospital Stay (HOSPITAL_COMMUNITY)
Admission: RE | Admit: 2012-01-14 | Discharge: 2012-01-17 | DRG: 470 | Disposition: A | Discharge status: Home Health | Payer: 59 | Source: Ambulatory Visit | Attending: Specialist | Admitting: Specialist

## 2012-01-14 ENCOUNTER — Encounter (HOSPITAL_COMMUNITY): Payer: Self-pay | Admitting: *Deleted

## 2012-01-14 ENCOUNTER — Inpatient Hospital Stay (HOSPITAL_COMMUNITY): Payer: 59

## 2012-01-14 ENCOUNTER — Inpatient Hospital Stay (HOSPITAL_COMMUNITY): Payer: 59 | Admitting: Anesthesiology

## 2012-01-14 DIAGNOSIS — Z981 Arthrodesis status: Secondary | ICD-10-CM

## 2012-01-14 DIAGNOSIS — E7800 Pure hypercholesterolemia, unspecified: Secondary | ICD-10-CM | POA: Diagnosis present

## 2012-01-14 DIAGNOSIS — D649 Anemia, unspecified: Secondary | ICD-10-CM | POA: Diagnosis not present

## 2012-01-14 DIAGNOSIS — K219 Gastro-esophageal reflux disease without esophagitis: Secondary | ICD-10-CM | POA: Diagnosis present

## 2012-01-14 DIAGNOSIS — M1710 Unilateral primary osteoarthritis, unspecified knee: Principal | ICD-10-CM | POA: Diagnosis present

## 2012-01-14 DIAGNOSIS — E039 Hypothyroidism, unspecified: Secondary | ICD-10-CM | POA: Diagnosis present

## 2012-01-14 DIAGNOSIS — Z96659 Presence of unspecified artificial knee joint: Secondary | ICD-10-CM

## 2012-01-14 DIAGNOSIS — I10 Essential (primary) hypertension: Secondary | ICD-10-CM | POA: Diagnosis present

## 2012-01-14 HISTORY — PX: TOTAL KNEE ARTHROPLASTY: SHX125

## 2012-01-14 LAB — TYPE AND SCREEN
ABO/RH(D): A NEG
Antibody Screen: NEGATIVE

## 2012-01-14 SURGERY — ARTHROPLASTY, KNEE, TOTAL
Anesthesia: General | Site: Knee | Laterality: Left | Wound class: Clean

## 2012-01-14 MED ORDER — MENTHOL 3 MG MT LOZG
1.0000 | LOZENGE | OROMUCOSAL | Status: DC | PRN
Start: 2012-01-14 — End: 2012-01-17
  Filled 2012-01-14: qty 9

## 2012-01-14 MED ORDER — CISATRACURIUM BESYLATE 2 MG/ML IV SOLN
INTRAVENOUS | Status: DC | PRN
  Administered 2012-01-14: 8 mg via INTRAVENOUS

## 2012-01-14 MED ORDER — OXYCODONE-ACETAMINOPHEN 5-325 MG PO TABS
1.0000 | ORAL_TABLET | ORAL | Status: DC
  Administered 2012-01-14: 1 via ORAL
  Administered 2012-01-14 – 2012-01-15 (×3): 2 via ORAL
  Filled 2012-01-14 (×2): qty 2
  Filled 2012-01-14: qty 1
  Filled 2012-01-14 (×2): qty 2

## 2012-01-14 MED ORDER — ROPIVACAINE HCL 5 MG/ML IJ SOLN
INTRAMUSCULAR | Status: DC | PRN
  Administered 2012-01-14: 30 mL

## 2012-01-14 MED ORDER — MIDAZOLAM HCL 5 MG/5ML IJ SOLN
INTRAMUSCULAR | Status: DC | PRN
  Administered 2012-01-14 (×2): 1 mg via INTRAVENOUS

## 2012-01-14 MED ORDER — DOCUSATE SODIUM 100 MG PO CAPS
100.0000 mg | ORAL_CAPSULE | Freq: Two times a day (BID) | ORAL | Status: DC
  Administered 2012-01-14 – 2012-01-17 (×5): 100 mg via ORAL
  Filled 2012-01-14 (×10): qty 1

## 2012-01-14 MED ORDER — BISACODYL 5 MG PO TBEC: 5.0000 mg | DELAYED_RELEASE_TABLET | Freq: Every day | ORAL | Status: DC | PRN

## 2012-01-14 MED ORDER — ONDANSETRON HCL 4 MG/2ML IJ SOLN
INTRAMUSCULAR | Status: DC | PRN
  Administered 2012-01-14: 4 mg via INTRAVENOUS

## 2012-01-14 MED ORDER — PROMETHAZINE HCL 25 MG/ML IJ SOLN: 6.2500 mg | INTRAMUSCULAR | Status: DC | PRN

## 2012-01-14 MED ORDER — POLYETHYLENE GLYCOL 3350 17 G PO PACK
17.0000 g | PACK | Freq: Every day | ORAL | Status: DC | PRN
  Filled 2012-01-14: qty 1

## 2012-01-14 MED ORDER — VENLAFAXINE HCL ER 75 MG PO CP24
75.0000 mg | ORAL_CAPSULE | Freq: Every day | ORAL | Status: DC
  Administered 2012-01-14 – 2012-01-16 (×3): 75 mg via ORAL
  Filled 2012-01-14 (×5): qty 1

## 2012-01-14 MED ORDER — HYDROMORPHONE HCL PF 1 MG/ML IJ SOLN
INTRAMUSCULAR | Status: DC | PRN
  Administered 2012-01-14 (×3): .4 mg via INTRAVENOUS

## 2012-01-14 MED ORDER — ACETAMINOPHEN 10 MG/ML IV SOLN
INTRAVENOUS | Status: DC | PRN
  Administered 2012-01-14: 1000 mg via INTRAVENOUS

## 2012-01-14 MED ORDER — LACTATED RINGERS IV SOLN: INTRAVENOUS | Status: DC

## 2012-01-14 MED ORDER — PHENOL 1.4 % MT LIQD: 1.0000 | OROMUCOSAL | Status: DC | PRN

## 2012-01-14 MED ORDER — SUCRALFATE 1 G PO TABS
1.0000 g | ORAL_TABLET | Freq: Four times a day (QID) | ORAL | Status: DC
  Administered 2012-01-14 – 2012-01-17 (×12): 1 g via ORAL
  Filled 2012-01-14 (×19): qty 1

## 2012-01-14 MED ORDER — LACTATED RINGERS IV SOLN
INTRAVENOUS | Status: DC
  Administered 2012-01-14 (×3): via INTRAVENOUS

## 2012-01-14 MED ORDER — CEFAZOLIN SODIUM-DEXTROSE 2-3 GM-% IV SOLR
2.0000 g | Freq: Four times a day (QID) | INTRAVENOUS | Status: AC
  Administered 2012-01-14 – 2012-01-15 (×3): 2 g via INTRAVENOUS
  Filled 2012-01-14 (×3): qty 50

## 2012-01-14 MED ORDER — DEXAMETHASONE SODIUM PHOSPHATE 4 MG/ML IJ SOLN
INTRAMUSCULAR | Status: DC | PRN
  Administered 2012-01-14: 10 mg via INTRAVENOUS

## 2012-01-14 MED ORDER — VENLAFAXINE HCL ER 75 MG PO CP24
75.0000 mg | ORAL_CAPSULE | Freq: Every day | ORAL | Status: DC
  Filled 2012-01-14: qty 1

## 2012-01-14 MED ORDER — EPHEDRINE SULFATE 50 MG/ML IJ SOLN
INTRAMUSCULAR | Status: DC | PRN
  Administered 2012-01-14: 10 mg via INTRAVENOUS
  Administered 2012-01-14: 5 mg via INTRAVENOUS
  Administered 2012-01-14: 10 mg via INTRAVENOUS
  Administered 2012-01-14: 5 mg via INTRAVENOUS

## 2012-01-14 MED ORDER — ACETAMINOPHEN 325 MG PO TABS
650.0000 mg | ORAL_TABLET | Freq: Four times a day (QID) | ORAL | Status: DC | PRN
  Administered 2012-01-15: 650 mg via ORAL
  Filled 2012-01-14: qty 2

## 2012-01-14 MED ORDER — HYDROMORPHONE HCL PF 1 MG/ML IJ SOLN
0.5000 mg | INTRAMUSCULAR | Status: DC | PRN
  Administered 2012-01-14 – 2012-01-16 (×6): 1 mg via INTRAVENOUS
  Filled 2012-01-14 (×6): qty 1

## 2012-01-14 MED ORDER — KETAMINE HCL 10 MG/ML IJ SOLN
INTRAMUSCULAR | Status: DC | PRN
  Administered 2012-01-14: 10 mg via INTRAVENOUS
  Administered 2012-01-14 (×2): 5 mg via INTRAVENOUS
  Administered 2012-01-14: 10 mg via INTRAVENOUS
  Administered 2012-01-14: 20 mg via INTRAVENOUS

## 2012-01-14 MED ORDER — NEBIVOLOL HCL 10 MG PO TABS
10.0000 mg | ORAL_TABLET | Freq: Every day | ORAL | Status: DC
  Administered 2012-01-15 – 2012-01-17 (×3): 10 mg via ORAL
  Filled 2012-01-14 (×4): qty 1

## 2012-01-14 MED ORDER — METHOCARBAMOL 500 MG PO TABS
500.0000 mg | ORAL_TABLET | Freq: Four times a day (QID) | ORAL | Status: DC | PRN
  Administered 2012-01-14 – 2012-01-17 (×7): 500 mg via ORAL
  Filled 2012-01-14 (×8): qty 1

## 2012-01-14 MED ORDER — HYDROMORPHONE HCL PF 1 MG/ML IJ SOLN
0.2500 mg | INTRAMUSCULAR | Status: DC | PRN
Start: 2012-01-14 — End: 2012-01-14

## 2012-01-14 MED ORDER — FENTANYL CITRATE 0.05 MG/ML IJ SOLN
INTRAMUSCULAR | Status: DC | PRN
  Administered 2012-01-14 (×2): 50 ug via INTRAVENOUS

## 2012-01-14 MED ORDER — LIDOCAINE HCL (CARDIAC) 20 MG/ML IV SOLN
INTRAVENOUS | Status: DC | PRN
  Administered 2012-01-14: 100 mg via INTRAVENOUS

## 2012-01-14 MED ORDER — ONDANSETRON HCL 4 MG/2ML IJ SOLN
4.0000 mg | Freq: Four times a day (QID) | INTRAMUSCULAR | Status: DC | PRN
  Administered 2012-01-15: 4 mg via INTRAVENOUS
  Filled 2012-01-14: qty 2

## 2012-01-14 MED ORDER — METOCLOPRAMIDE HCL 5 MG/ML IJ SOLN: 5.0000 mg | Freq: Three times a day (TID) | INTRAMUSCULAR | Status: DC | PRN

## 2012-01-14 MED ORDER — LEVOTHYROXINE SODIUM 175 MCG PO TABS
175.0000 ug | ORAL_TABLET | Freq: Every day | ORAL | Status: DC
  Administered 2012-01-15 – 2012-01-17 (×3): 175 ug via ORAL
  Filled 2012-01-14 (×4): qty 1

## 2012-01-14 MED ORDER — RIVAROXABAN 10 MG PO TABS
10.0000 mg | ORAL_TABLET | Freq: Every day | ORAL | Status: DC
  Administered 2012-01-15 – 2012-01-17 (×3): 10 mg via ORAL
  Filled 2012-01-14 (×4): qty 1

## 2012-01-14 MED ORDER — SUCCINYLCHOLINE CHLORIDE 20 MG/ML IJ SOLN
INTRAMUSCULAR | Status: DC | PRN
  Administered 2012-01-14: 100 mg via INTRAVENOUS

## 2012-01-14 MED ORDER — CEFAZOLIN SODIUM-DEXTROSE 2-3 GM-% IV SOLR
2.0000 g | Freq: Once | INTRAVENOUS | Status: AC
  Administered 2012-01-14: 2 g via INTRAVENOUS

## 2012-01-14 MED ORDER — BUPIVACAINE-EPINEPHRINE 0.5% -1:200000 IJ SOLN
INTRAMUSCULAR | Status: DC | PRN
  Administered 2012-01-14: 8 mL

## 2012-01-14 MED ORDER — DROPERIDOL 2.5 MG/ML IJ SOLN
INTRAMUSCULAR | Status: DC | PRN
  Administered 2012-01-14: 0.625 mg via INTRAVENOUS

## 2012-01-14 MED ORDER — ONDANSETRON HCL 4 MG PO TABS
4.0000 mg | ORAL_TABLET | Freq: Four times a day (QID) | ORAL | Status: DC | PRN
  Filled 2012-01-14: qty 1

## 2012-01-14 MED ORDER — METOCLOPRAMIDE HCL 10 MG PO TABS: 5.0000 mg | ORAL_TABLET | Freq: Three times a day (TID) | ORAL | Status: DC | PRN

## 2012-01-14 MED ORDER — ACETAMINOPHEN 650 MG RE SUPP: 650.0000 mg | Freq: Four times a day (QID) | RECTAL | Status: DC | PRN

## 2012-01-14 MED ORDER — DIPHENHYDRAMINE HCL 12.5 MG/5ML PO ELIX: 12.5000 mg | ORAL_SOLUTION | ORAL | Status: DC | PRN

## 2012-01-14 MED ORDER — SODIUM CHLORIDE 0.9 % IR SOLN
Status: DC | PRN
  Administered 2012-01-14: 08:00:00

## 2012-01-14 MED ORDER — SODIUM CHLORIDE 0.9 % IV SOLN
INTRAVENOUS | Status: DC
  Administered 2012-01-14: 15:00:00 via INTRAVENOUS
  Administered 2012-01-15 (×2): 20 mL/h via INTRAVENOUS
  Administered 2012-01-15: 01:00:00 via INTRAVENOUS

## 2012-01-14 MED ORDER — SUFENTANIL CITRATE 50 MCG/ML IV SOLN
INTRAVENOUS | Status: DC | PRN
  Administered 2012-01-14: 20 ug via INTRAVENOUS
  Administered 2012-01-14 (×3): 10 ug via INTRAVENOUS

## 2012-01-14 MED ORDER — PROPOFOL 10 MG/ML IV EMUL
INTRAVENOUS | Status: DC | PRN
  Administered 2012-01-14: 190 mg via INTRAVENOUS

## 2012-01-14 MED ORDER — SODIUM CHLORIDE 0.9 % IR SOLN
Status: DC | PRN
  Administered 2012-01-14: 3000 mL

## 2012-01-14 MED ORDER — METHOCARBAMOL 100 MG/ML IJ SOLN
500.0000 mg | Freq: Four times a day (QID) | INTRAVENOUS | Status: DC | PRN
  Administered 2012-01-15: 500 mg via INTRAVENOUS
  Filled 2012-01-14: qty 5

## 2012-01-14 SURGICAL SUPPLY — 61 items
BAG SPEC THK2 15X12 ZIP CLS (MISCELLANEOUS) ×1
BAG ZIPLOCK 12X15 (MISCELLANEOUS) ×2 IMPLANT
BANDAGE ELASTIC 4 VELCRO ST LF (GAUZE/BANDAGES/DRESSINGS) ×2 IMPLANT
BANDAGE ELASTIC 6 VELCRO ST LF (GAUZE/BANDAGES/DRESSINGS) ×2 IMPLANT
BANDAGE ESMARK 6X9 LF (GAUZE/BANDAGES/DRESSINGS) ×1 IMPLANT
BLADE SAG 18X100X1.27 (BLADE) ×2 IMPLANT
BLADE SAW SGTL 13.0X1.19X90.0M (BLADE) ×2 IMPLANT
BNDG CMPR 9X6 STRL LF SNTH (GAUZE/BANDAGES/DRESSINGS) ×1
BNDG ESMARK 6X9 LF (GAUZE/BANDAGES/DRESSINGS) ×2
CEMENT HV SMART SET (Cement) ×4 IMPLANT
CHLORAPREP W/TINT 26ML (MISCELLANEOUS) IMPLANT
CLOTH BEACON ORANGE TIMEOUT ST (SAFETY) ×2 IMPLANT
CUFF TOURN SGL QUICK 34 (TOURNIQUET CUFF) ×2
CUFF TRNQT CYL 34X4X40X1 (TOURNIQUET CUFF) ×1 IMPLANT
DECANTER SPIKE VIAL GLASS SM (MISCELLANEOUS) ×2 IMPLANT
DRAPE LG THREE QUARTER DISP (DRAPES) ×2 IMPLANT
DRAPE ORTHO SPLIT 77X108 STRL (DRAPES) ×4
DRAPE POUCH INSTRU U-SHP 10X18 (DRAPES) ×2 IMPLANT
DRAPE SURG ORHT 6 SPLT 77X108 (DRAPES) ×2 IMPLANT
DRAPE U-SHAPE 47X51 STRL (DRAPES) ×2 IMPLANT
DRSG ADAPTIC 3X8 NADH LF (GAUZE/BANDAGES/DRESSINGS) ×2 IMPLANT
DRSG PAD ABDOMINAL 8X10 ST (GAUZE/BANDAGES/DRESSINGS) ×2 IMPLANT
DURAPREP 26ML APPLICATOR (WOUND CARE) ×2 IMPLANT
ELECT REM PT RETURN 9FT ADLT (ELECTROSURGICAL) ×2
ELECTRODE REM PT RTRN 9FT ADLT (ELECTROSURGICAL) ×1 IMPLANT
EVACUATOR 1/8 PVC DRAIN (DRAIN) ×2 IMPLANT
FACESHIELD LNG OPTICON STERILE (SAFETY) ×10 IMPLANT
GLOVE BIO SURGEON STRL SZ 6.5 (GLOVE) ×2 IMPLANT
GLOVE BIOGEL PI IND STRL 8 (GLOVE) ×1 IMPLANT
GLOVE BIOGEL PI INDICATOR 8 (GLOVE) ×1
GLOVE ECLIPSE 6.5 STRL STRAW (GLOVE) ×2 IMPLANT
GLOVE INDICATOR 6.5 STRL GRN (GLOVE) ×2 IMPLANT
GLOVE SURG SS PI 8.0 STRL IVOR (GLOVE) ×4 IMPLANT
GOWN PREVENTION PLUS LG XLONG (DISPOSABLE) ×2 IMPLANT
GOWN PREVENTION PLUS XLARGE (GOWN DISPOSABLE) ×2 IMPLANT
GOWN STRL REIN XL XLG (GOWN DISPOSABLE) ×2 IMPLANT
HANDPIECE INTERPULSE COAX TIP (DISPOSABLE) ×2
IMMOBILIZER KNEE 20 (SOFTGOODS) ×2
IMMOBILIZER KNEE 20 THIGH 36 (SOFTGOODS) ×1 IMPLANT
KIT BASIN OR (CUSTOM PROCEDURE TRAY) ×2 IMPLANT
MANIFOLD NEPTUNE II (INSTRUMENTS) ×2 IMPLANT
NS IRRIG 1000ML POUR BTL (IV SOLUTION) ×1 IMPLANT
PACK TOTAL JOINT (CUSTOM PROCEDURE TRAY) ×2 IMPLANT
PADDING CAST COTTON 6X4 STRL (CAST SUPPLIES) ×2 IMPLANT
POSITIONER SURGICAL ARM (MISCELLANEOUS) ×2 IMPLANT
SET HNDPC FAN SPRY TIP SCT (DISPOSABLE) ×1 IMPLANT
SLEEVE SURGEON STRL (DRAPES) ×1 IMPLANT
SPONGE GAUZE 4X4 12PLY (GAUZE/BANDAGES/DRESSINGS) ×1 IMPLANT
SPONGE SURGIFOAM ABS GEL 100 (HEMOSTASIS) ×2 IMPLANT
STAPLER VISISTAT (STAPLE) ×2 IMPLANT
SUCTION FRAZIER 12FR DISP (SUCTIONS) ×2 IMPLANT
SUT BONE WAX W31G (SUTURE) ×2 IMPLANT
SUT VIC AB 1 CT1 27 (SUTURE) ×6
SUT VIC AB 1 CT1 27XBRD ANTBC (SUTURE) ×4 IMPLANT
SUT VIC AB 2-0 CT1 27 (SUTURE) ×6
SUT VIC AB 2-0 CT1 TAPERPNT 27 (SUTURE) ×3 IMPLANT
SYR 30ML LL (SYRINGE) ×2 IMPLANT
TOWER CARTRIDGE SMART MIX (DISPOSABLE) ×2 IMPLANT
TRAY FOLEY CATH 14FRSI W/METER (CATHETERS) ×2 IMPLANT
WATER STERILE IRR 1500ML POUR (IV SOLUTION) ×2 IMPLANT
WRAP KNEE MAXI GEL POST OP (GAUZE/BANDAGES/DRESSINGS) ×2 IMPLANT

## 2012-01-14 NOTE — Anesthesia Postprocedure Evaluation (Signed)
  Anesthesia Post-op Note  Patient: Kathryn [name]  Procedure(s) Performed: Procedure(s) (LRB): TOTAL KNEE ARTHROPLASTY (Left)  Patient Location: PACU  Anesthesia Type: GA combined with regional for post-op pain  Level of Consciousness: awake and alert   Airway and Oxygen Therapy: Patient Spontanous Breathing  Post-op Pain: mild  Post-op Assessment: Post-op Vital signs reviewed, Patient's Cardiovascular Status Stable, Respiratory Function Stable, Patent Airway and No signs of Nausea or vomiting  Post-op Vital Signs: stable  Complications: No apparent anesthesia complications

## 2012-01-14 NOTE — Preoperative (Signed)
Beta Blockers   Reason not to administer Beta Blockers:Not Applicable 

## 2012-01-14 NOTE — Transfer of Care (Signed)
Immediate Anesthesia Transfer of Care Note  Patient: Kathryn [name]  Procedure(s) Performed: Procedure(s) (LRB): TOTAL KNEE ARTHROPLASTY (Left)  Patient Location: PACU  Anesthesia Type: GA combined with regional for post-op pain  Level of Consciousness: awake, alert  and oriented  Airway & Oxygen Therapy: Patient Spontanous Breathing and Patient connected to face mask oxygen  Post-op Assessment: Report given to PACU RN and Post -op Vital signs reviewed and stable  Post vital signs: Reviewed and stable  Complications: No apparent anesthesia complications

## 2012-01-14 NOTE — Brief Op Note (Signed)
01/14/2012  9:50 AM  PATIENT:  Kathryn [name]  56 y.o. female  PRE-OPERATIVE DIAGNOSIS:  Degenerative Joint Disease on the Left Knee  POST-OPERATIVE DIAGNOSIS:  Degenerative Joint Disease on the Left knee  PROCEDURE:  Procedure(s) (LRB): TOTAL KNEE ARTHROPLASTY (Left)  SURGEON:  Surgeon(s) and Role:    * Javier Docker, MD - Primary  PHYSICIAN ASSISTANT:   ASSISTANTS: strader   ANESTHESIA:   general  EBL:  Total I/O In: 2000 [I.V.:2000] Out: 325 [Urine:275; Blood:50]  BLOOD ADMINISTERED:none  DRAINS: (1) Hemovact drain(s) in the 1 with  Suction Open   LOCAL MEDICATIONS USED:  MARCAINE     SPECIMEN:  No Specimen  DISPOSITION OF SPECIMEN:  N/A  COUNTS:  YES  TOURNIQUET:   Total Tourniquet Time Documented: Thigh (Left) - 101 minutes  DICTATION: .Other Dictation: Dictation Number [id number]  PLAN OF CARE: Admit to inpatient   PATIENT DISPOSITION:  PACU - hemodynamically stable.   Delay start of Pharmacological VTE agent (>24hrs) due to surgical blood loss or risk of bleeding: no

## 2012-01-14 NOTE — Anesthesia Procedure Notes (Addendum)
Anesthesia Regional Block:  Femoral nerve block  Pre-Anesthetic Checklist: ,, timeout performed, Correct Patient, Correct Site, Correct Laterality, Correct Procedure, Correct Position, site marked, Risks and benefits discussed,  Surgical consent,  Pre-op evaluation,  At surgeon's request and post-op pain management   Prep: chloraprep       Needles:  Injection technique: Single-shot  Needle Type: Stimiplex          Additional Needles:  Procedures: ultrasound guided Femoral nerve block Narrative:  Start time: 01/14/2012 7:05 AM End time: 01/14/2012 7:15 AM Injection made incrementally with aspirations every 5 mL.  Performed by: Personally  Anesthesiologist: Gaetano Hawthorne MD  Additional Notes: Patient tolerated the procedure well without complications  Femoral nerve block Procedure Name: Intubation Date/Time: 01/14/2012 7:31 AM Performed by: Lurlean Leyden, Revan Gendron L. Patient Re-evaluated:Patient Re-evaluated prior to inductionOxygen Delivery Method: Circle system utilized Preoxygenation: Pre-oxygenation with 100% oxygen Intubation Type: IV induction Ventilation: Mask ventilation without difficulty and Oral airway inserted - appropriate to patient size Laryngoscope Size: Miller and 2 Grade View: Grade I Tube type: Oral Tube size: 7.5 mm Number of attempts: 1 Airway Equipment and Method: Stylet Placement Confirmation: ETT inserted through vocal cords under direct vision,  breath sounds checked- equal and bilateral and positive ETCO2 Secured at: 22 cm Tube secured with: Tape Dental Injury: Teeth and Oropharynx as per pre-operative assessment

## 2012-01-14 NOTE — Interval H&P Note (Signed)
History and Physical Interval Note:  01/14/2012 7:18 AM  Kathryn [name]  has presented today for surgery, with the diagnosis of Degenerative Joint Disease on the Left  The various methods of treatment have been discussed with the patient and family. After consideration of risks, benefits and other options for treatment, the patient has consented to  Procedure(s) (LRB): TOTAL KNEE ARTHROPLASTY (Left) as a surgical intervention .  The patients' history has been reviewed, patient examined, no change in status, stable for surgery.  I have reviewed the patients' chart and labs.  Questions were answered to the patient's satisfaction.     Audon Heymann C

## 2012-01-15 DIAGNOSIS — M1710 Unilateral primary osteoarthritis, unspecified knee: Secondary | ICD-10-CM

## 2012-01-15 LAB — BASIC METABOLIC PANEL
BUN: 10 mg/dL (ref 6–23)
CO2: 29 mEq/L (ref 19–32)
Calcium: 8.5 mg/dL (ref 8.4–10.5)
Chloride: 97 mEq/L (ref 96–112)
Creatinine, Ser: 0.66 mg/dL (ref 0.50–1.10)
GFR calc Af Amer: >90 mL/min (ref >90)
GFR calc non Af Amer: >90 mL/min (ref >90)
Glucose, Bld: 145 mg/dL — ABNORMAL HIGH (ref 70–99)
Potassium: 3.9 mEq/L (ref 3.5–5.1)
Sodium: 135 mEq/L (ref 135–145)

## 2012-01-15 LAB — CBC
HCT: 28.3 % — ABNORMAL LOW (ref 36.0–46.0)
Hemoglobin: 9.3 g/dL — ABNORMAL LOW (ref 12.0–15.0)
MCH: 28.6 pg (ref 26.0–34.0)
MCHC: 32.9 g/dL (ref 30.0–36.0)
MCV: 87.1 fL (ref 78.0–100.0)
Platelets: 238 10*3/uL (ref 150–400)
RBC: 3.25 MIL/uL — ABNORMAL LOW (ref 3.87–5.11)
RDW: 14.8 % (ref 11.5–15.5)
WBC: 8.8 10*3/uL (ref 4.0–10.5)

## 2012-01-15 MED ORDER — OXYCODONE HCL 5 MG PO TABS
5.0000 mg | ORAL_TABLET | ORAL | Status: DC
  Administered 2012-01-15 – 2012-01-16 (×7): 10 mg via ORAL
  Administered 2012-01-16 (×3): 5 mg via ORAL
  Administered 2012-01-16: 10 mg via ORAL
  Administered 2012-01-16: 5 mg via ORAL
  Administered 2012-01-16: 10 mg via ORAL
  Administered 2012-01-17: 5 mg via ORAL
  Administered 2012-01-17 (×2): 10 mg via ORAL
  Administered 2012-01-17: 5 mg via ORAL
  Administered 2012-01-17: 10 mg via ORAL
  Filled 2012-01-15 (×2): qty 2
  Filled 2012-01-15 (×2): qty 1
  Filled 2012-01-15: qty 2
  Filled 2012-01-15: qty 1
  Filled 2012-01-15: qty 2
  Filled 2012-01-15: qty 1
  Filled 2012-01-15: qty 2
  Filled 2012-01-15: qty 1
  Filled 2012-01-15 (×3): qty 2
  Filled 2012-01-15 (×2): qty 1
  Filled 2012-01-15: qty 2
  Filled 2012-01-15: qty 1
  Filled 2012-01-15 (×3): qty 2

## 2012-01-15 MED FILL — Ropivacaine HCl Inj 5 MG/ML: INTRAMUSCULAR | Qty: 30 | Status: AC

## 2012-01-15 NOTE — Evaluation (Signed)
Physical Therapy Evaluation Patient Details Name: RENIA MIKELSON MRN: [id number] DOB: 04-12-1956 Today's Date: 01/15/2012  Problem List:  Patient Active Problem List  Diagnoses  . HYPOTHYROIDISM  . HYPERLIPIDEMIA  . HYPERTENSION  . DYSPNEA  . CARPAL TUNNEL SYNDROME, HX OF  . Osteoarthritis of knee    Past Medical History:  Past Medical History  Diagnosis Date  . Hypertension   . Elevated cholesterol   . GERD (gastroesophageal reflux disease)   . H/O hiatal hernia   . PONV (postoperative nausea and vomiting)     felt cut with one of past knee surgeries   Past Surgical History:  Past Surgical History  Procedure Date  . Right total knee replacement  2008  . Left knee arthroscopy  last done 2008    x 3  . Abdominal hysterectomy yrs ago  . Left shoulder arthroscopy yrs ago  . Carpal tunnel release yrs ago  . Lower back surgery 2005  . Cervical neck fusion yrs ago    C 3 and C 4     PT Assessment/Plan/Recommendation PT Assessment Clinical Impression Statement: Pt with L TKR presents with decreased L LE strength/ROM and ltd functional mobility.  Pt will benefit from skilled PT intervention to maximize IND for d/c home PT Recommendation/Assessment: Patient will need skilled PT in the acute care venue PT Problem List: Decreased strength;Decreased range of motion;Decreased activity tolerance;Decreased mobility;Decreased knowledge of use of DME;Pain PT Therapy Diagnosis : Difficulty walking PT Plan PT Frequency: 7X/week PT Treatment/Interventions: DME instruction;Gait training;Stair training;Functional mobility training;Therapeutic activities;Therapeutic exercise;Patient/family education PT Recommendation Recommendations for Other Services: OT consult Follow Up Recommendations: Home health PT Equipment Recommended: None recommended by PT PT Goals  Acute Rehab PT Goals PT Goal Formulation: With patient Time For Goal Achievement: 7 days Pt will go Supine/Side to Sit: with  supervision PT Goal: Supine/Side to Sit - Progress: Goal set today Pt will go Sit to Supine/Side: with supervision PT Goal: Sit to Supine/Side - Progress: Goal set today Pt will go Sit to Stand: with supervision PT Goal: Sit to Stand - Progress: Goal set today Pt will go Stand to Sit: with supervision PT Goal: Stand to Sit - Progress: Goal set today Pt will Ambulate: 51 - 150 feet;with supervision;with rolling walker PT Goal: Ambulate - Progress: Goal set today Pt will Go Up / Down Stairs: 3-5 stairs;with least restrictive assistive device;with min assist PT Goal: Up/Down Stairs - Progress: Goal set today  PT Evaluation Precautions/Restrictions  Precautions Precautions: Knee Required Braces or Orthoses: Yes Knee Immobilizer: Discontinue once straight leg raise with < 10 degree lag Restrictions Weight Bearing Restrictions: No Other Position/Activity Restrictions: WBAT Prior Functioning  Home Living Lives With: Spouse Receives Help From: Family Type of Home: House Home Layout: One level Home Access: Stairs to enter Entrance Stairs-Rails: Can reach both Entrance Stairs-Number of Steps: 3 Home Adaptive Equipment: Walker - rolling Prior Function Level of Independence: Independent with basic ADLs;Independent with gait;Independent with transfers Able to Take Stairs?: Yes Driving: Yes Cognition Cognition Arousal/Alertness: Awake/alert Overall Cognitive Status: Appears within functional limits for tasks assessed Orientation Level: Oriented X4 Sensation/Coordination Coordination Gross Motor Movements are Fluid and Coordinated: Yes Extremity Assessment RUE Assessment RUE Assessment: Within Functional Limits LUE Assessment LUE Assessment: Within Functional Limits RLE Assessment RLE Assessment: Within Functional Limits LLE Assessment LLE Assessment: Exceptions to WFL (2/5 quads, -10 - 40 AAROM) Mobility (including Balance) Bed Mobility Bed Mobility: Yes Supine to Sit: 3: Mod  assist Supine to Sit Details (indicate  cue type and reason): cues for use of UEs and R LE to self assist Transfers Transfers: Yes Sit to Stand: 4: Min assist;3: Mod assist;With upper extremity assist;From bed Sit to Stand Details (indicate cue type and reason): cues for use of UEs and for LE position Stand to Sit: 4: Min assist;With upper extremity assist;With armrests;To chair/3-in-1 Stand to Sit Details: cues for use of UEs and for LE position Ambulation/Gait Ambulation/Gait: Yes Ambulation/Gait Assistance: 4: Min assist Ambulation/Gait Assistance Details (indicate cue type and reason): cues for posture, sequence, and position from RW Ambulation Distance (Feet): 50 Feet Assistive device: Rolling walker Gait Pattern: Step-to pattern    Exercise  Total Joint Exercises Ankle Circles/Pumps: AROM;10 reps;Supine;Both Quad Sets: AROM;10 reps;Supine;Both Heel Slides: AAROM;10 reps;Left;Supine Straight Leg Raises: AAROM;10 reps;Supine;Left End of Session PT - End of Session Equipment Utilized During Treatment: Left lower extremity prosthesis Activity Tolerance: Patient tolerated treatment well Patient left: in chair;with call bell in reach;with family/visitor present Nurse Communication: Mobility status for transfers;Mobility status for ambulation General Behavior During Session: [facility] for tasks performed Cognition: [facility] for tasks performed  Maycie Luera 01/15/2012, 12:02 PM

## 2012-01-15 NOTE — Op Note (Signed)
Kathryn [name], Kathryn [name] NO.:  [id number]  MEDICAL RECORD NO.:  [id number]  LOCATION:  1617                         FACILITY:  [facility]  PHYSICIAN:  Kathryn [name], M.D.    DATE OF BIRTH:  29-May-1956  DATE OF PROCEDURE:  01/14/2012 DATE OF DISCHARGE:                              OPERATIVE REPORT   PREOPERATIVE DIAGNOSES:  Degenerative joint disease and end-stage osteoarthrosis of the left knee.  POSTOPERATIVE DIAGNOSES:  Degenerative joint disease and end-stage osteoarthrosis of the left knee.  PROCEDURE:  Left total knee arthroplasty.  ANESTHESIA:  General.  ASSISTANT:  Kathryn [name], P.A.  HISTORY:  This is a 56 year old with end-stage bone-on-bone arthritis of the left knee.  Fracture, rest, activity modification, corticosteroid injection, arthroscopic debridement, and bone-on-bone arthritis by x- rays was indicated for replacement of the degenerated joint to improve the ambulatory capacity.  Risks and benefits were discussed including bleeding, infection, damage to neurovascular structures, DVT, PE, anesthetic complications, need for revision, arthrofibrosis, and manipulation technique and placed in the supine position.  After induction of adequate general anesthesia, 2 g Kefzol to the left lower extremity.  The left lower extremity was prepped, draped, and exsanguinated in usual sterile fashion.  Thigh tourniquet inflated to 275 mmHg.  Knee flexed.  Midline incision made over the knee.  Full- thickness flaps and medial parapatellar arthrotomy.  Soft tissue elevated medially.  The patella was everted.  Tricompartmental osteoporosis was noted bone-on-bone.  Leksell was utilized and removed osteophytes and remnants of the medial lateral menisci to the ACL.  Step drill utilized to enter the femoral canal, 11 of this femur was utilized.  Oscillating saw performed.  We incised the distal femur to 4, she had 3 on the contralateral side.  __________ degrees of  external rotation.  The anterior, posterior, and chamfer cuts were performed. Soft tissue protected posteriorly at all times.  Attention turned towards the tibia was subluxed.  External alignment guide utilized bisecting the ankle parallel to the tibial shaft and on the high side which was laterally and oscillating saw was used to perform this cut. We sized the flexion extension gaps with a 10 and 12 and the 12 seemed to fit better.  She had equal flexion-extension gaps, however.  We turned our attention back towards completing the tibia and it was then subluxed and the keels were protected with soft tissues at all times and trialed with 3 maximized our surface area at the tibial tubercle, pinned, drilled centrally and punch guide utilized.  Then we turned our attention towards completing the femur.  Box cut was then performed after utilizing the jig in line with the femoral canal bisecting the condyles.  This was then performed and after this was removed we then put a trial femur, tibia, 10 mm and 12 mm insert.  The 12 fit better. We then everted the patella and measured at 24 to 5, 35 was used on the other side, we moved eight and a half to a 15, planed the patella and drilled our PEG holes utilizing the patella and placed a trial patella. Flexion, extension, and normal patellofemoral tracking.  All trials were utilized to protect posteriorly and we checked  posteriorly.  Popliteus was intact and cauterized the geniculate vessels.  No residual soft tissue interposing was noted, used pulsatile lavage and the joint flexed and the knee dried all surfaces, mixed with cement in proper fashion to the back table and injected into the proximal tibia, digitally pressurized the canal back at the 3 tray and redundant cement removed. We cemented the distal femur.  Redundant cement removed.  Placed the trial 10 and then a 12 allowing curing of the cement, cemented the patella and after curing of the  cement the 12 fit better with flexion, extension, stable in flexion, extension, negative anterior drawer.  Good stability, varus valgus stressing, 0 to 30 degrees.  Redundant cement was then removed meticulously.  Copiously irrigated the wound.  Placed a 12, begin full flexion, extension, good stability, varus valgus stressing, 0 to 30 degrees.  Good patellofemoral tracking.  Hemovac was placed and brought out through a lateral stab wound in the skin. Marcaine with epinephrine was infiltrated in the joint.  We used a permanent 12 insert.  Repaired the patellar arthrotomy, 1 Vicryl in a figure-of-eight sutures, subcutaneous with 0 and 2-0 Vicryl simple sutures.  Skin was reapproximated with staples.  Wound was dressed sterilely.  The flexion at gravity gets 90 degrees.  Good stability. Tourniquet was deflated and there was adequate vascularization in the lower extremity appreciated.  The patient tolerated the procedure well.  No complications.  TOURNIQUET TIME:  One hour and 40 minutes.     Kathryn [name], M.D.     Kathryn [name]  D:  01/14/2012  T:  01/15/2012  Job:  [id number]

## 2012-01-15 NOTE — Progress Notes (Signed)
01/15/12 Nursing 0630 Freddie Breech PA called re patient has reached limit on acetominophen with scheduled percocets. Order received to dc percocet and switch to oxy 5-10 mg q 3 hours

## 2012-01-15 NOTE — Progress Notes (Signed)
Subjective: 1 Day Post-Op Procedure(s) (LRB): TOTAL KNEE ARTHROPLASTY (Left) Patient reports pain as 5 on 0-10 scale.  Denies CP or SOB  Patient has complaints of pain as expected but otherwise is doing well  We will start therapy today. Plan is to go home after hospital stay.  Objective: Vital signs in last 24 hours: Temp:  [97.6 F (36.4 C)-98.5 F (36.9 C)] 97.9 F (36.6 C) (02/22 0639) Pulse Rate:  [60-69] 69  (02/22 0639) Resp:  [11-14] 14  (02/22 0639) BP: (94-138)/(56-82) 94/56 mmHg (02/22 0639) SpO2:  [95 %-100 %] 97 % (02/22 0639) Weight:  [100.245 kg (221 lb)] 100.245 kg (221 lb) (02/21 1249)  Intake/Output from previous day:  Intake/Output Summary (Last 24 hours) at 01/15/12 0806 Last data filed at 01/15/12 0715  Gross per 24 hour  Intake 3443.33 ml  Output   2085 ml  Net 1358.33 ml    Intake/Output this shift: Total I/O In: 133.3 [I.V.:133.3] Out: -   Labs: Results for orders placed during the hospital encounter of 01/14/12  TYPE AND SCREEN      Component Value Range   ABO/RH(D) A NEG     Antibody Screen NEG     Sample Expiration 01/17/2012    CBC      Component Value Range   WBC 8.8  4.0 - 10.5 (K/uL)   RBC 3.25 (*) 3.87 - 5.11 (MIL/uL)   Hemoglobin 9.3 (*) 12.0 - 15.0 (g/dL)   HCT 16.1 (*) 09.6 - 46.0 (%)   MCV 87.1  78.0 - 100.0 (fL)   MCH 28.6  26.0 - 34.0 (pg)   MCHC 32.9  30.0 - 36.0 (g/dL)   RDW 04.5  40.9 - 81.1 (%)   Platelets 238  150 - 400 (K/uL)  BASIC METABOLIC PANEL      Component Value Range   Sodium 135  135 - 145 (mEq/L)   Potassium 3.9  3.5 - 5.1 (mEq/L)   Chloride 97  96 - 112 (mEq/L)   CO2 29  19 - 32 (mEq/L)   Glucose, Bld 145 (*) 70 - 99 (mg/dL)   BUN 10  6 - 23 (mg/dL)   Creatinine, Ser 9.14  0.50 - 1.10 (mg/dL)   Calcium 8.5  8.4 - 78.2 (mg/dL)   GFR calc non Af Amer >90  >90 (mL/min)   GFR calc Af Amer >90  >90 (mL/min)    Exam - Neurologically intact Neurovascular intact Sensation intact  distally Dorsiflexion/Plantar flexion intact Incision: dressing C/D/I Compartment soft Dressing - clean Motor function intact - moving foot and toes well on exam.  Hemovac pulled without difficulty.  Assessment/Plan: 1 Day Post-Op Procedure(s) (LRB): TOTAL KNEE ARTHROPLASTY (Left)  Advance diet Up with therapy Past Medical History  Diagnosis Date  . Hypertension   . Elevated cholesterol   . GERD (gastroesophageal reflux disease)   . H/O hiatal hernia   . PONV (postoperative nausea and vomiting)     felt cut with one of past knee surgeries    DVT Prophylaxis - Xarelto Protocol Weight-Bearing as tolerated to left leg Will KVO IV Dressing change tomorrow D/c planning for Sunday No vaccines.  Michaeline Eckersley R. 01/15/2012, 8:06 AM

## 2012-01-15 NOTE — Progress Notes (Signed)
Physical Therapy Treatment Patient Details Name: Kathryn [name] MRN: [id number] DOB: 1956-05-10 Today's Date: 01/15/2012  PT Assessment/Plan  PT - Assessment/Plan Comments on Treatment Session: Pt with sudden onset pain and nausea limiting activity tolerance. PT Plan: Discharge plan remains appropriate PT Frequency: 7X/week Recommendations for Other Services: OT consult Follow Up Recommendations: Home health PT Equipment Recommended: None recommended by PT PT Goals  Acute Rehab PT Goals PT Goal Formulation: With patient Time For Goal Achievement: 7 days Pt will go Supine/Side to Sit: with supervision PT Goal: Supine/Side to Sit - Progress: Goal set today Pt will go Sit to Supine/Side: with supervision PT Goal: Sit to Supine/Side - Progress: Goal set today Pt will go Sit to Stand: with supervision PT Goal: Sit to Stand - Progress: Goal set today Pt will go Stand to Sit: with supervision PT Goal: Stand to Sit - Progress: Goal set today Pt will Ambulate: 51 - 150 feet;with supervision;with rolling walker PT Goal: Ambulate - Progress: Goal set today Pt will Go Up / Down Stairs: 3-5 stairs;with least restrictive assistive device;with min assist PT Goal: Up/Down Stairs - Progress: Goal set today  PT Treatment Precautions/Restrictions  Precautions Precautions: Knee Required Braces or Orthoses: Yes Knee Immobilizer: Discontinue once straight leg raise with < 10 degree lag Restrictions Weight Bearing Restrictions: No Other Position/Activity Restrictions: WBAT Mobility (including Balance) Bed Mobility Sit to Supine: 1: +2 Total assist Sit to Supine - Details (indicate cue type and reason): cues for sequence; increased assist secondary to pt pain level. - pt 60% Transfers Sit to Stand: 1: +2 Total assist Sit to Stand Details (indicate cue type and reason): pt 70% - increased assist  secondary to pain level - cues for use of UEs and for LE managemen Stand to Sit: 1: +2 Total  assist Stand to Sit Details: pt 70% - increased assist  secondary to pain level - cues for use of UEs and for LE managemen Ambulation/Gait Ambulation/Gait Assistance: 1: +2 Total assist Ambulation/Gait Assistance Details (indicate cue type and reason): chair to bed, unable to WB on L LE secondary to pain.  Cues for posture, position from RW Ambulation Distance (Feet): 3 Feet Assistive device: Rolling walker Gait Pattern: Step-to pattern    Exercise    End of Session PT - End of Session Equipment Utilized During Treatment: Left knee immobilizer Activity Tolerance: Patient limited by pain Patient left: in bed;with call bell in reach;with family/visitor present Nurse Communication: Mobility status for transfers;Mobility status for ambulation General Behavior During Session: [facility] for tasks performed Cognition: [facility] for tasks performed  Sylvester Salonga 01/15/2012, 1:41 PM

## 2012-01-16 LAB — CBC
HCT: 25.2 % — ABNORMAL LOW (ref 36.0–46.0)
Hemoglobin: 8.2 g/dL — ABNORMAL LOW (ref 12.0–15.0)
MCH: 28.8 pg (ref 26.0–34.0)
MCHC: 32.5 g/dL (ref 30.0–36.0)
MCV: 88.4 fL (ref 78.0–100.0)
Platelets: 201 10*3/uL (ref 150–400)
RBC: 2.85 MIL/uL — ABNORMAL LOW (ref 3.87–5.11)
RDW: 15.2 % (ref 11.5–15.5)
WBC: 8.2 10*3/uL (ref 4.0–10.5)

## 2012-01-16 NOTE — Evaluation (Signed)
Occupational Therapy Evaluation Patient Details Name: Kathryn [name] MRN: [id number] DOB: 31-Jan-1956 Today's Date: 01/16/2012  Problem List:  Patient Active Problem List  Diagnoses  . HYPOTHYROIDISM  . HYPERLIPIDEMIA  . HYPERTENSION  . DYSPNEA  . CARPAL TUNNEL SYNDROME, HX OF  . Osteoarthritis of knee    Past Medical History:  Past Medical History  Diagnosis Date  . Hypertension   . Elevated cholesterol   . GERD (gastroesophageal reflux disease)   . H/O hiatal hernia   . PONV (postoperative nausea and vomiting)     felt cut with one of past knee surgeries   Past Surgical History:  Past Surgical History  Procedure Date  . Right total knee replacement  2008  . Left knee arthroscopy  last done 2008    x 3  . Abdominal hysterectomy yrs ago  . Left shoulder arthroscopy yrs ago  . Carpal tunnel release yrs ago  . Lower back surgery 2005  . Cervical neck fusion yrs ago    C 3 and C 4     OT Assessment/Plan/Recommendation OT Assessment Clinical Impression Statement: This 56 year old female was admitted for L TKA.  She  was independent with ADLs prior to admission and needs min A currently.  Husband will assist her at home.  Reviewed bathroom transfers and pt/husband verbalize understanding.  No further OT needs at this time. OT Recommendation/Assessment: Patient does not need any further OT services OT Recommendation Follow Up Recommendations: No OT follow up Equipment Recommended: None recommended by OT OT Goals    OT Evaluation Precautions/Restrictions  Precautions Precautions: Knee Required Braces or Orthoses: Yes Knee Immobilizer: Discontinue once straight leg raise with < 10 degree lag Restrictions Weight Bearing Restrictions: No Other Position/Activity Restrictions: WBAT Prior Functioning Home Living Bathroom Shower/Tub: Walk-in shower Bathroom Toilet: Standard (has grab bar next to it) Prior Function Level of Independence: Independent with basic  ADLs;Independent with gait;Independent with transfers ADL ADL Grooming: Performed;Supervision/safety Where Assessed - Grooming: Standing at sink Upper Body Bathing: Simulated;Set up Where Assessed - Upper Body Bathing: Sitting, chair;Supported Lower Body Bathing: Simulated;Minimal assistance Where Assessed - Lower Body Bathing: Sit to stand from chair Upper Body Dressing: Simulated;Minimal assistance;Other (comment) (iv) Where Assessed - Upper Body Dressing: Sitting, chair;Supported Lower Body Dressing: Simulated;Minimal assistance Where Assessed - Lower Body Dressing: Sit to stand from chair Toilet Transfer: Performed;Minimal assistance Toilet Transfer Method: Proofreader: Comfort height toilet;Grab bars Toileting - Clothing Manipulation: Simulated;Minimal assistance;Other (comment) (min guard) Where Assessed - Toileting Clothing Manipulation: Standing Toileting - Hygiene: Performed;Set up Where Assessed - Toileting Hygiene: Sit on 3-in-1 or toilet Tub/Shower Transfer: Performed;Minimal assistance (min guard) Tub/Shower Transfer Method: Ambulating Equipment Used: Rolling walker Ambulation Related to ADLs: min guard ADL Comments: husband will assist.  Feels he can assist from standard toilet with grab bar Vision/Perception  Vision - History Baseline Vision: No visual deficits Cognition Cognition Overall Cognitive Status: Appears within functional limits for tasks assessed Orientation Level: Oriented X4 Sensation/Coordination   Extremity Assessment RUE Assessment RUE Assessment: Within Functional Limits LUE Assessment LUE Assessment: Within Functional Limits Mobility  Transfers Sit to Stand: 4: Min assist Stand to Sit: 4: Min assist Exercises   End of Session OT - End of Session Equipment Utilized During Treatment: Left knee immobilizer Activity Tolerance: Patient limited by fatigue Patient left: in chair;with call bell in reach;with  family/visitor present General Behavior During Session: [facility] for tasks performed Cognition: [facility] for tasks performed [employer], OTR/L [phone] 01/16/2012  Axil Copeman  01/16/2012, 1:57 PM

## 2012-01-16 NOTE — Progress Notes (Signed)
Physical Therapy Treatment Patient Details Name: Kathryn [name] MRN: [id number] DOB: 1956/05/29 Today's Date: 01/16/2012  PT Assessment/Plan  PT - Assessment/Plan Comments on Treatment Session: OOB deferred at pt request secondary to fatigue PT Plan: Discharge plan remains appropriate PT Frequency: 7X/week Recommendations for Other Services: OT consult Follow Up Recommendations: Home health PT Equipment Recommended: None recommended by OT PT Goals  Acute Rehab PT Goals Time For Goal Achievement: 7 days  PT Treatment Precautions/Restrictions  Precautions Precautions: Knee Required Braces or Orthoses: Yes Knee Immobilizer: Discontinue once straight leg raise with < 10 degree lag Restrictions Weight Bearing Restrictions: No Other Position/Activity Restrictions: WBAT Mobility (including Balance) Transfers Sit to Stand: 4: Min assist Stand to Sit: 4: Min assist    Exercise  Total Joint Exercises Ankle Circles/Pumps: AROM;20 reps;Supine;Both Quad Sets: AAROM;15 reps;Supine;Both Heel Slides: AAROM;Left;Supine;20 reps Straight Leg Raises: AAROM;20 reps;Supine;Left End of Session PT - End of Session Equipment Utilized During Treatment: Left knee immobilizer Activity Tolerance: Patient limited by fatigue;Patient limited by pain Patient left: in bed;with call bell in reach;with family/visitor present General Behavior During Session: [facility] for tasks performed Cognition: [facility] for tasks performed  Kathryn [name] 01/16/2012, 4:03 PM

## 2012-01-16 NOTE — Progress Notes (Signed)
Physical Therapy Treatment Patient Details Name: Kathryn [name] MRN: [id number] DOB: 19-Nov-1956 Today's Date: 01/16/2012  PT Assessment/Plan  PT - Assessment/Plan Comments on Treatment Session: OOB deferred at pt request secondary to fatigue PT Plan: Discharge plan remains appropriate PT Frequency: 7X/week Recommendations for Other Services: OT consult Follow Up Recommendations: Home health PT Equipment Recommended: None recommended by OT PT Goals  Acute Rehab PT Goals PT Goal Formulation: With patient Time For Goal Achievement: 7 days Pt will go Supine/Side to Sit: with supervision PT Goal: Supine/Side to Sit - Progress: Progressing toward goal Pt will go Sit to Supine/Side: with supervision PT Goal: Sit to Supine/Side - Progress: Progressing toward goal Pt will go Sit to Stand: with supervision PT Goal: Sit to Stand - Progress: Progressing toward goal Pt will go Stand to Sit: with supervision PT Goal: Stand to Sit - Progress: Progressing toward goal Pt will Ambulate: 51 - 150 feet;with supervision;with rolling walker PT Goal: Ambulate - Progress: Progressing toward goal Pt will Go Up / Down Stairs: 3-5 stairs;with least restrictive assistive device;with min assist PT Goal: Up/Down Stairs - Progress: Progressing toward goal  PT Treatment Precautions/Restrictions  Precautions Precautions: Knee Required Braces or Orthoses: Yes Knee Immobilizer: Discontinue once straight leg raise with < 10 degree lag Restrictions Weight Bearing Restrictions: No Other Position/Activity Restrictions: WBAT Mobility (including Balance) Bed Mobility Supine to Sit: 4: Min assist Supine to Sit Details (indicate cue type and reason): cues for sequence; assist with L LE Sit to Supine: 4: Min assist;HOB flat Sit to Supine - Details (indicate cue type and reason): cues for sequence, min assist for L LE Transfers Sit to Stand: 4: Min assist Sit to Stand Details (indicate cue type and reason): cues  for use of UEs and LE position Stand to Sit: 4: Min assist Stand to Sit Details: cues for use of UEs and LE position Ambulation/Gait Ambulation/Gait Assistance: 4: Min assist Ambulation/Gait Assistance Details (indicate cue type and reason): cues for sequence, posture and position from RW Ambulation Distance (Feet): 168 Feet Assistive device: Rolling walker Gait Pattern: Step-to pattern    Exercise  Total Joint Exercises Ankle Circles/Pumps: AROM;20 reps;Supine;Both Quad Sets: AAROM;15 reps;Supine;Both Heel Slides: AAROM;Left;Supine;20 reps Straight Leg Raises: AAROM;20 reps;Supine;Left End of Session PT - End of Session Equipment Utilized During Treatment: Left knee immobilizer Activity Tolerance: Patient tolerated treatment well Patient left: in bed;with call bell in reach;with family/visitor present Nurse Communication: Mobility status for transfers;Mobility status for ambulation General Behavior During Session: [facility] for tasks performed Cognition: [facility] for tasks performed  Kathryn [name] 01/16/2012, 4:18 PM

## 2012-01-16 NOTE — Progress Notes (Signed)
Physical Therapy Treatment Patient Details Name: Kathryn [name] MRN: [id number] DOB: Mar 25, 1956 Today's Date: 01/16/2012  PT Assessment/Plan  PT - Assessment/Plan Comments on Treatment Session: OOB deferred at pt request secondary to fatigue PT Plan: Discharge plan remains appropriate PT Frequency: 7X/week Recommendations for Other Services: OT consult Follow Up Recommendations: Home health PT Equipment Recommended: None recommended by OT PT Goals  Acute Rehab PT Goals PT Goal Formulation: With patient Time For Goal Achievement: 7 days Pt will go Supine/Side to Sit: with supervision PT Goal: Supine/Side to Sit - Progress: Progressing toward goal Pt will go Sit to Supine/Side: with supervision PT Goal: Sit to Supine/Side - Progress: Progressing toward goal Pt will go Sit to Stand: with supervision PT Goal: Sit to Stand - Progress: Progressing toward goal Pt will go Stand to Sit: with supervision PT Goal: Stand to Sit - Progress: Progressing toward goal Pt will Ambulate: 51 - 150 feet;with supervision;with rolling walker PT Goal: Ambulate - Progress: Progressing toward goal Pt will Go Up / Down Stairs: 3-5 stairs;with least restrictive assistive device;with min assist PT Goal: Up/Down Stairs - Progress: Progressing toward goal  PT Treatment Precautions/Restrictions  Precautions Precautions: Knee Required Braces or Orthoses: Yes Knee Immobilizer: Discontinue once straight leg raise with < 10 degree lag Restrictions Weight Bearing Restrictions: No Other Position/Activity Restrictions: WBAT Mobility (including Balance) Bed Mobility Supine to Sit: 4: Min assist Supine to Sit Details (indicate cue type and reason): cues for sequence; assist with L LE Transfers Sit to Stand: 4: Min assist Sit to Stand Details (indicate cue type and reason): cues for use of UEs and LE position Stand to Sit: 4: Min assist Stand to Sit Details: cues for use of UEs and LE  position Ambulation/Gait Ambulation/Gait Assistance: 4: Min assist Ambulation/Gait Assistance Details (indicate cue type and reason): cues for posture, sequence, and position from RW Ambulation Distance (Feet): 120 Feet Assistive device: Rolling walker Gait Pattern: Step-to pattern    End of Session PT - End of Session Equipment Utilized During Treatment: Left knee immobilizer Activity Tolerance: Patient tolerated treatment well Patient left: in chair;with call bell in reach;with family/visitor present Nurse Communication: Mobility status for transfers;Mobility status for ambulation General Behavior During Session: [facility] for tasks performed Cognition: [facility] for tasks performed  Kathryn [name] 01/16/2012, 4:11 PM

## 2012-01-16 NOTE — Progress Notes (Signed)
Subjective: 2 Days Post-Op Procedure(s) (LRB): TOTAL KNEE ARTHROPLASTY (Left) Patient reports pain as 3 on 0-10 scale.   Denies CP or SOB.  Voiding without difficulty. Positive flatus. Objective: Vital signs in last 24 hours: Temp:  [97 F (36.1 C)-99.1 F (37.3 C)] 98.1 F (36.7 C) (02/23 1610) Pulse Rate:  [71-77] 72  (02/23 0633) Resp:  [16] 16  (02/23 9604) BP: (106-126)/(67-80) 123/75 mmHg (02/23 0633) SpO2:  [94 %-99 %] 99 % (02/23 5409)  Intake/Output from previous day: 02/22 0701 - 02/23 0700 In: 1741 [P.O.:960; I.V.:779; IV Piggyback:2] Out: 950 [Urine:950] Intake/Output this shift:     Basename 01/16/12 0445 01/15/12 0418  HGB 8.2* 9.3*    Basename 01/16/12 0445 01/15/12 0418  WBC 8.2 8.8  RBC 2.85* 3.25*  HCT 25.2* 28.3*  PLT 201 238    Basename 01/15/12 0418  NA 135  K 3.9  CL 97  CO2 29  BUN 10  CREATININE 0.66  GLUCOSE 145*  CALCIUM 8.5   No results found for this basename: LABPT:2,INR:2 in the last 72 hours  Neurologically intact ABD soft Intact pulses distally Dorsiflexion/Plantar flexion intact No cellulitis present Compartment soft  Assessment/Plan: 2 Days Post-Op Procedure(s) (LRB): TOTAL KNEE ARTHROPLASTY (Left) Advance diet Up with therapy. moniter H/H. Doing well.  Aiana Nordquist C 01/16/2012, 7:37 AM

## 2012-01-17 LAB — CBC
HCT: 23.8 % — ABNORMAL LOW (ref 36.0–46.0)
Hemoglobin: 7.8 g/dL — ABNORMAL LOW (ref 12.0–15.0)
MCH: 28.7 pg (ref 26.0–34.0)
MCHC: 32.8 g/dL (ref 30.0–36.0)
MCV: 87.5 fL (ref 78.0–100.0)
Platelets: 178 10*3/uL (ref 150–400)
RBC: 2.72 MIL/uL — ABNORMAL LOW (ref 3.87–5.11)
RDW: 15 % (ref 11.5–15.5)
WBC: 7.3 10*3/uL (ref 4.0–10.5)

## 2012-01-17 MED ORDER — CENTRUM PO CHEW: 1.0000 | CHEWABLE_TABLET | Freq: Every day | ORAL | Status: AC

## 2012-01-17 MED ORDER — OXYCODONE HCL 5 MG PO TABS: 5.0000 mg | ORAL_TABLET | ORAL | Status: AC

## 2012-01-17 MED ORDER — RIVAROXABAN 10 MG PO TABS: 10.0000 mg | ORAL_TABLET | Freq: Every day | ORAL | Status: DC

## 2012-01-17 MED ORDER — METHOCARBAMOL 500 MG PO TABS: 500.0000 mg | ORAL_TABLET | Freq: Four times a day (QID) | ORAL | Status: AC | PRN

## 2012-01-17 NOTE — Progress Notes (Signed)
Patient discharged to home. DC instructions given with spouse at bedside. No concerns voiced. Left unit in wheelchair pushed by secretary. Left in good condition. Spouse is taking patient home. Vwilliams,rn.

## 2012-01-17 NOTE — Progress Notes (Addendum)
Physical Therapy Treatment Patient Details Name: Kathryn [name] MRN: [id number] DOB: Dec 01, 1955 Today's Date: 01/17/2012  PT Assessment/Plan  PT - Assessment/Plan Comments on Treatment Session: The patient is progressing well with gait, mobility and ther ex despite low Hgb levels toady (7.8 last reading).  She had no reports of lightheadedness with gait.   PT Plan: Discharge plan remains appropriate;Frequency remains appropriate PT Frequency: 7X/week Recommendations for Other Services:Follow Up Recommendations: Home health PT Equipment Recommended: None recommended by PT PT Goals  Acute Rehab PT Goals PT Goal: Supine/Side to Sit - Progress: Progressing toward goal PT Goal: Sit to Stand - Progress: Progressing toward goal PT Goal: Stand to Sit - Progress: Progressing toward goal PT Goal: Ambulate - Progress: Progressing toward goal  PT Treatment Precautions/Restrictions  Precautions Precautions: Knee Required Braces or Orthoses: Yes Knee Immobilizer: Discontinue once straight leg raise with < 10 degree lag Restrictions Weight Bearing Restrictions: Yes Other Position/Activity Restrictions: WBAT Mobility (including Balance) Bed Mobility Supine to Sit: 4: Min assist;HOB elevated (Comment degrees) (HOB 20 degrees) Supine to Sit Details (indicate cue type and reason): min assist of left leg only.  Verbal cues for 1/2 bridge technique Transfers Sit to Stand: 4: Min assist;With upper extremity assist;From bed;From elevated surface Sit to Stand Details (indicate cue type and reason): cues for safe hand placement.  Min assist to steady trunk an RW during transition Stand to Sit: 4: Min assist;With armrests;To elevated surface;To chair/3-in-1 Stand to Sit Details: cues to reach back for armrests.  Min assist to kick out left leg.   Ambulation/Gait Ambulation/Gait Assistance: 4: Min assist Ambulation/Gait Assistance Details (indicate cue type and reason): min guard assist for safety.     Ambulation Distance (Feet): 200 Feet Assistive device: Rolling walker Gait Pattern: Step-to pattern;Antalgic    Exercise  Total Joint Exercises Ankle Circles/Pumps: AROM;15 reps;Both Quad Sets: AROM;Left;10 reps;Limitations Quad Sets Limitations: 5 second holds Towel Squeeze: AROM;Both;10 reps Short Arc QuadBarbaraann [name];Left;10 reps Heel Slides: AAROM;10 reps;Left Hip ABduction/ADduction: AAROM;Left;10 reps Straight Leg Raises: AAROM;10 reps;Left End of Session PT - End of Session Equipment Utilized During Treatment: Left knee immobilizer;Gait belt Activity Tolerance: Patient tolerated treatment well Patient left: in chair;with call bell in reach General Behavior During Session: [facility] for tasks performed Cognition: [facility] for tasks performed  Kathryn [name] B. Jabier Deese, PT, DPT [phone] 01/17/2012, 11:11 AM

## 2012-01-17 NOTE — Progress Notes (Signed)
Subjective: 3 Days Post-Op Procedure(s) (LRB): TOTAL KNEE ARTHROPLASTY (Left) Patient reports pain as 5 on 0-10 scale.   Denies CP or SOB. Voiding without difficulty. Patient has complaints of knee pain as expected.  She denies any dizziness or nausea  Objective: Vital signs in last 24 hours: Temp:  [98.3 F (36.8 C)-98.4 F (36.9 C)] 98.4 F (36.9 C) (02/24 0618) Pulse Rate:  [71-76] 71  (02/24 0618) Resp:  [16] 16  (02/24 0618) BP: (113-123)/(73-76) 113/73 mmHg (02/24 0618) SpO2:  [92 %-99 %] 99 % (02/24 0618)  Intake/Output from previous day:  Intake/Output Summary (Last 24 hours) at 01/17/12 0724 Last data filed at 01/17/12 0500  Gross per 24 hour  Intake    680 ml  Output   1850 ml  Net  -1170 ml    Intake/Output this shift:    Labs: Results for orders placed during the hospital encounter of 01/14/12  TYPE AND SCREEN      Component Value Range   ABO/RH(D) A NEG     Antibody Screen NEG     Sample Expiration 01/17/2012    CBC      Component Value Range   WBC 8.8  4.0 - 10.5 (K/uL)   RBC 3.25 (*) 3.87 - 5.11 (MIL/uL)   Hemoglobin 9.3 (*) 12.0 - 15.0 (g/dL)   HCT 13.0 (*) 86.5 - 46.0 (%)   MCV 87.1  78.0 - 100.0 (fL)   MCH 28.6  26.0 - 34.0 (pg)   MCHC 32.9  30.0 - 36.0 (g/dL)   RDW 78.4  69.6 - 29.5 (%)   Platelets 238  150 - 400 (K/uL)  BASIC METABOLIC PANEL      Component Value Range   Sodium 135  135 - 145 (mEq/L)   Potassium 3.9  3.5 - 5.1 (mEq/L)   Chloride 97  96 - 112 (mEq/L)   CO2 29  19 - 32 (mEq/L)   Glucose, Bld 145 (*) 70 - 99 (mg/dL)   BUN 10  6 - 23 (mg/dL)   Creatinine, Ser 2.84  0.50 - 1.10 (mg/dL)   Calcium 8.5  8.4 - 13.2 (mg/dL)   GFR calc non Af Amer >90  >90 (mL/min)   GFR calc Af Amer >90  >90 (mL/min)  CBC      Component Value Range   WBC 8.2  4.0 - 10.5 (K/uL)   RBC 2.85 (*) 3.87 - 5.11 (MIL/uL)   Hemoglobin 8.2 (*) 12.0 - 15.0 (g/dL)   HCT 44.0 (*) 10.2 - 46.0 (%)   MCV 88.4  78.0 - 100.0 (fL)   MCH 28.8  26.0 - 34.0 (pg)     MCHC 32.5  30.0 - 36.0 (g/dL)   RDW 72.5  36.6 - 44.0 (%)   Platelets 201  150 - 400 (K/uL)  CBC      Component Value Range   WBC 7.3  4.0 - 10.5 (K/uL)   RBC 2.72 (*) 3.87 - 5.11 (MIL/uL)   Hemoglobin 7.8 (*) 12.0 - 15.0 (g/dL)   HCT 34.7 (*) 42.5 - 46.0 (%)   MCV 87.5  78.0 - 100.0 (fL)   MCH 28.7  26.0 - 34.0 (pg)   MCHC 32.8  30.0 - 36.0 (g/dL)   RDW 95.6  38.7 - 56.4 (%)   Platelets 178  150 - 400 (K/uL)    Exam - Neurologically intact Neurovascular intact Sensation intact distally Dorsiflexion/Plantar flexion intact Incision: no drainage Compartment soft Dressing/Incision - clean, dry Motor function intact -  moving foot and toes well on exam.   Assessment/Plan: 3 Days Post-Op Procedure(s) (LRB): TOTAL KNEE ARTHROPLASTY (Left)  Plan for discharge tomorrow Past Medical History  Diagnosis Date  . Hypertension   . Elevated cholesterol   . GERD (gastroesophageal reflux disease)   . H/O hiatal hernia   . PONV (postoperative nausea and vomiting)     felt cut with one of past knee surgeries    DVT Prophylaxis - Xarelto / Coumadin Protocol Weight-Bearing as tolerated to left leg Will repeat H/H in am if down or becomes symptomatic then will transfuse D/c O2 PT/OT  Nancie Bocanegra R. 01/17/2012, 7:24 AM

## 2012-01-17 NOTE — Progress Notes (Signed)
Physical Therapy Treatment Patient Details Name: Kathryn [name] MRN: [id number] DOB: 1955-12-15 Today's Date: 01/17/2012  PT Assessment/Plan  PT - Assessment/Plan Comments on Treatment Session: The patient successfully completed steps with husband educated on technique PT Plan: Discharge plan remains appropriate;Frequency remains appropriate PT Frequency: 7X/week Follow Up Recommendations: Home health PT Equipment Recommended: None recommended by PT PT Goals  Acute Rehab PT Goals PT Goal: Sit to Supine/Side - Progress: Progressing toward goal Pt will go Sit to Stand: with modified independence PT Goal: Sit to Stand - Progress: Updated due to goal met Pt will go Stand to Sit: with modified independence PT Goal: Stand to Sit - Progress: Updated due to goals met Pt will Ambulate: with modified independence;with rolling walker PT Goal: Ambulate - Progress: Updated due to goal met Pt will Go Up / Down Stairs: 3-5 stairs;with supervision;with rail(s) PT Goal: Up/Down Stairs - Progress: Updated due to goal met  PT Treatment Precautions/Restrictions  Precautions Precautions: Knee Required Braces or Orthoses: Yes Knee Immobilizer: Discontinue once straight leg raise with < 10 degree lag Restrictions Weight Bearing Restrictions: Yes Other Position/Activity Restrictions: WBAT Mobility (including Balance) Bed Mobility Sit to Supine: 4: Min assist;HOB flat;With rail Sit to Supine - Details (indicate cue type and reason): min assist to help her get her left leg into the bed.   Transfers Sit to Stand: 5: Supervision;From chair/3-in-1;With armrests Sit to Stand Details (indicate cue type and reason): supervision for safety Stand to Sit: 5: Supervision;With upper extremity assist;To bed;To elevated surface Stand to Sit Details: supervision for safety Ambulation/Gait Ambulation/Gait Assistance: 5: Supervision Ambulation/Gait Assistance Details (indicate cue type and reason): supervision for  safety Ambulation Distance (Feet): 150 Feet Assistive device: Rolling walker Gait Pattern: Step-to pattern;Antalgic Stairs: Yes Stairs Assistance: 4: Min assist Stairs Assistance Details (indicate cue type and reason): min assist for safety and balance  Stair Management Technique: Two rails;Step to pattern;Forwards Number of Stairs: 10  Height of Stairs: 6  (4 and 6" steps)    End of Session PT - End of Session Equipment Utilized During Treatment: Left knee immobilizer;Gait belt (RW) Activity Tolerance: Patient tolerated treatment well Patient left: in bed;with call bell in reach;with family/visitor present (husband) General Behavior During Session: [facility] for tasks performed Cognition: [facility] for tasks performed  Leola Fiore B. Ellarae Nevitt, PT, DPT [phone] 01/17/2012, 4:38 PM

## 2012-01-17 NOTE — Progress Notes (Signed)
CM spoke with pt concerning d/c planning. Per pt choice [facility] to provide  HHPT. Pt has DME present in room. Pt's husband to assist in home care. CM spoke with intake rep at [facility] at [phone], start of care is 01/19/12.   Leonie Green [phone]

## 2012-01-18 NOTE — Discharge Summary (Signed)
Patient ID: Kathryn [name] MRN: [id number] DOB/AGE: 1956/02/11 56 y.o.  Admit date: 01/14/2012 Discharge date: 01/18/2012  Admission Diagnoses:  Active Problems:  Osteoarthritis of knee  Past Medical History  Diagnosis Date  . Hypertension   . Elevated cholesterol   . GERD (gastroesophageal reflux disease)   . H/O hiatal hernia   . PONV (postoperative nausea and vomiting)     felt cut with one of past knee surgeries   Discharge Diagnoses:  Same s/p left TKA Asymptomatic post operative blood loss anemia   Surgeries: Procedure(s): TOTAL KNEE ARTHROPLASTY on 01/14/2012    Discharged Condition: Improved  Hospital Course: Kathryn [name] is an 56 y.o. female who was admitted 01/14/2012 for operative treatment of OA of the left knee. Patient has severe unremitting pain that affects sleep, daily activities, and work/hobbies. After pre-op clearance the patient was taken to the operating room on 01/14/2012 and underwent  Procedure(s): TOTAL KNEE ARTHROPLASTY.    Patient was given perioperative antibiotics: Anti-infectives     Start     Dose/Rate Route Frequency Ordered Stop   01/14/12 1400   ceFAZolin (ANCEF) IVPB 2 g/50 mL premix        2 g 100 mL/hr over 30 Minutes Intravenous Every 6 hours 01/14/12 1233 01/15/12 0144   01/14/12 0800   polymyxin B 500,000 Units, bacitracin 50,000 Units in sodium chloride irrigation 0.9 % 500 mL irrigation  Status:  Discontinued          As needed 01/14/12 0800 01/14/12 0951   01/14/12 0600   ceFAZolin (ANCEF) IVPB 2 g/50 mL premix        2 g 100 mL/hr over 30 Minutes Intravenous  Once 01/14/12 0550 01/14/12 0740           Patient was given sequential compression devices, early ambulation, and chemoprophylaxis to prevent DVT.  Patient benefited maximally from hospital stay and there were no complications.    Recent vital signs: Patient Vitals for the past 24 hrs:  BP Temp Temp src Pulse Resp SpO2  01/17/12 1435 - - - - - 96 %  01/17/12  1430 100/64 mmHg 98.1 F (36.7 C) Oral 74  16  88 %     Recent laboratory studies:  Basename 01/17/12 0441 01-20-12 0445  WBC 7.3 8.2  HGB 7.8* 8.2*  HCT 23.8* 25.2*  PLT 178 201  NA -- --  K -- --  CL -- --  CO2 -- --  BUN -- --  CREATININE -- --  GLUCOSE -- --  INR -- --  CALCIUM -- --     Discharge Medications:   Medication List  As of 01/18/2012 12:09 PM   STOP taking these medications         HYDROcodone-acetaminophen 5-325 MG per tablet         TAKE these medications         acetaminophen 325 MG tablet   Commonly known as: TYLENOL   Take 650 mg by mouth every 6 (six) hours as needed. Pain      desvenlafaxine 50 MG 24 hr tablet   Commonly known as: PRISTIQ   Take 50 mg by mouth at bedtime.      levothyroxine 175 MCG tablet   Commonly known as: SYNTHROID, LEVOTHROID   Take 175 mcg by mouth daily before breakfast.      methocarbamol 500 MG tablet   Commonly known as: ROBAXIN   Take 1 tablet (500 mg total) by mouth every 6 (  six) hours as needed.      multivitamin-iron-minerals-folic acid chewable tablet   Chew 1 tablet by mouth daily.      nebivolol 10 MG tablet   Commonly known as: BYSTOLIC   Take 10 mg by mouth daily before breakfast.      oxyCODONE 5 MG immediate release tablet   Commonly known as: Oxy IR/ROXICODONE   Take 1-2 tablets (5-10 mg total) by mouth every 3 (three) hours.      rivaroxaban 10 MG Tabs tablet   Commonly known as: XARELTO   Take 1 tablet (10 mg total) by mouth daily with breakfast.      sucralfate 1 G tablet   Commonly known as: CARAFATE   Take 1 g by mouth 4 (four) times daily.            Diagnostic Studies: Dg Chest 2 View  01/05/2012  *RADIOLOGY REPORT*  Clinical Data: Preoperative assessment for left knee replacement  CHEST - 2 VIEW  Comparison: 07/16/2010  Findings: Enlargement of cardiac silhouette. Tortuous aorta. Mediastinal contours and pulmonary vascularity otherwise normal. Lungs clear. No pleural effusion  or pneumothorax. Minimal end plate spur formation thoracic spine. Bones appear demineralized. Prior resection versus resorption of distal left clavicle unchanged since 2008.  IMPRESSION: Enlargement of cardiac silhouette. No acute abnormalities.  Original Report Authenticated By: Lollie Marrow, M.D.   Dg Knee 1-2 Views Left  01/05/2012  *RADIOLOGY REPORT*  Clinical Data: Preoperative assessment for left total knee replacement  LEFT KNEE - 1-2 VIEW  Comparison: None  Findings: Osseous demineralization. Diffuse joint space narrowing and marginal spur formation. Minimal lateral subluxation of tibia. Patellar spur at quadriceps tendon insertion. No acute fracture, dislocation or bone destruction. Knee joint effusion present.  IMPRESSION: Osseous demineralization. Tricompartmental osteoarthritic changes with associated knee joint effusion.  Original Report Authenticated By: Lollie Marrow, M.D.   X-ray Knee Left Port  01/14/2012  *RADIOLOGY REPORT*  Clinical Data: Postop left knee arthroplasty.  PORTABLE LEFT KNEE - 1-2 VIEW  Comparison: 01/05/2012.  Findings: There has been interval left knee arthroplasty.  Surgical drain and skin staples are in place.  Subcutaneous and joint air and fluid are noted.  IMPRESSION: Postoperative changes of left knee arthroplasty with expected postoperative findings.  Original Report Authenticated By: Reyes Ivan, M.D.    Disposition: Home-Health Care Svc  Discharge Orders    Future Orders Please Complete By Expires   Diet - low sodium heart healthy      Call MD / Call 911      Comments:   If you experience chest pain or shortness of breath, CALL 911 and be transported to the hospital emergency room.  If you develope a fever above 101 F, pus (white drainage) or increased drainage or redness at the wound, or calf pain, call your surgeon's office.   Constipation Prevention      Comments:   Drink plenty of fluids.  Prune juice may be helpful.  You may use a stool  softener, such as Colace (over the counter) 100 mg twice a day.  Use MiraLax (over the counter) for constipation as needed.   Increase activity slowly as tolerated      Weight Bearing as taught in Physical Therapy      Comments:   Use a walker or crutches as instructed.   Discharge instructions      Comments:   Use knee immobilizer until can SLR x 10 Elevate above heart level at least 6 x  a day for 20 minutes Daily dressing change  Keep wound clean and dry OK to shower        Follow-up Information    Follow up with BEANE,JEFFREY C, MD in 2 weeks.   Contact information:   [facility] [address] [place] [address] [phone]           Signed: Liam Graham. 01/18/2012, 12:09 PM

## 2012-01-25 ENCOUNTER — Encounter (HOSPITAL_COMMUNITY): Payer: Self-pay | Admitting: Specialist

## 2012-08-30 ENCOUNTER — Encounter: Payer: Self-pay | Admitting: Cardiology

## 2013-01-25 ENCOUNTER — Encounter: Payer: Self-pay | Admitting: Cardiology

## 2015-02-27 ENCOUNTER — Encounter: Payer: Self-pay | Admitting: Neurology

## 2015-02-27 ENCOUNTER — Ambulatory Visit (INDEPENDENT_AMBULATORY_CARE_PROVIDER_SITE_OTHER): Payer: 59 | Admitting: Neurology

## 2015-02-27 VITALS — BP 110/80 | HR 76 | Ht 66.0 in | Wt 230.0 lb

## 2015-02-27 DIAGNOSIS — F445 Conversion disorder with seizures or convulsions: Secondary | ICD-10-CM

## 2015-02-27 DIAGNOSIS — G4089 Other seizures: Secondary | ICD-10-CM

## 2015-02-27 DIAGNOSIS — F444 Conversion disorder with motor symptom or deficit: Secondary | ICD-10-CM

## 2015-02-27 NOTE — Progress Notes (Signed)
Subjective:   Kathryn [name] was seen in consultation in the movement disorder clinic at the request of Sheela Stack, MD.  The evaluation is for tremor.  This patient is accompanied in the office by her spouse who supplements the history.   The patient is a 59 y.o. right handed female with a history of tremor.  It has been going on for several months.  Its at rest and with use of the hands.   It is intermittent and comes multiple times throughout the day; it will last several minutes.   She will try to sit on her hands to hide it.  It is mostly the right hand.  If she looks at it, she cannot stop it.  No new medications that started with the onset.   There is a family hx of tremor in her mother but she didn't have a diagnosis.  The xanax does seem to help this.  Affected by caffeine:  Doesn't know (doesn't drink caffeine) Affected by alcohol:  Doesn't know (doesn't drink alcohol) Affected by stress:  Unknown (she thought so, but husband not so convinced) Affected by fatigue:  No. Spills soup if on spoon:  Yes.   (she thinks that she would but it is intermittent) Spills glass of liquid if full:  Yes.   Affects ADL's (tying shoes, brushing teeth, etc):  It would if she was shaking.  Pt states that she has had panic attacks for years and she brings in a video of these.   She thrashes around in these videos and pt states that she doesn't remember doing this (no recollection of the event) but her husband states that if he brings her a bag to hyperventilate in the bag, it lessens the event.  She will awaken with a headache.  Pt states that she will have a tingling that comes over her and up her arms and up her back and she will then know an event will happen.  She had to pull over driving one day as she knew that an event was going to come on.  The events happen up to two in one day and then may not happen for a month.  She has only had 3 this year.    No Known Allergies  Outpatient  Encounter Prescriptions as of 02/27/2015  Medication Sig  . acetaminophen (TYLENOL) 325 MG tablet Take 650 mg by mouth every 6 (six) hours as needed. Pain  . ALPRAZolam (XANAX) 0.25 MG tablet Take 0.25 mg by mouth 2 (two) times daily as needed.   Marland Kitchen CRANBERRY PO Take by mouth daily.  Marland Kitchen desvenlafaxine (PRISTIQ) 50 MG 24 hr tablet Take 50 mg by mouth at bedtime.  Marland Kitchen levothyroxine (SYNTHROID, LEVOTHROID) 175 MCG tablet Take 175 mcg by mouth daily before breakfast.  . metoprolol succinate (TOPROL-XL) 50 MG 24 hr tablet Take 50 mg by mouth daily. Take with or immediately following a meal.  . topiramate (TOPAMAX) 25 MG tablet Take 25 mg by mouth daily.   . vitamin B-12 (CYANOCOBALAMIN) 1000 MCG tablet Take 1,000 mcg by mouth daily.  . [DISCONTINUED] nebivolol (BYSTOLIC) 10 MG tablet Take 10 mg by mouth daily before breakfast.  . [DISCONTINUED] rivaroxaban (XARELTO) 10 MG TABS tablet Take 1 tablet (10 mg total) by mouth daily with breakfast.  . [DISCONTINUED] sucralfate (CARAFATE) 1 G tablet Take 1 g by mouth 4 (four) times daily.    Past Medical History  Diagnosis Date  . Hypertension   . Elevated cholesterol   .  GERD (gastroesophageal reflux disease)   . H/O hiatal hernia   . PONV (postoperative nausea and vomiting)     felt cut with one of past knee surgeries  . Tremor   . Anxiety     Past Surgical History  Procedure Laterality Date  . Right total knee replacement   2008  . Left knee arthroscopy   last done 2008    x 3  . Abdominal hysterectomy  yrs ago  . Left shoulder arthroscopy  yrs ago  . Carpal tunnel release  yrs ago  . Lower back surgery  2005  . Cervical neck fusion  yrs ago    C 3 and C 4   . Total knee arthroplasty  01/14/2012    Procedure: TOTAL KNEE ARTHROPLASTY;  Surgeon: Johnn Hai, MD;  Location: [facility];  Service: Orthopedics;  Laterality: Left;  preop femoral nerve block    History   Social History  . Marital Status: Married    Spouse Name: N/A  . Number of  Children: N/A  . Years of Education: N/A   Occupational History  . Not on file.   Social History Main Topics  . Smoking status: Never Smoker   . Smokeless tobacco: Never Used  . Alcohol Use: No  . Drug Use: No  . Sexual Activity: Not on file   Other Topics Concern  . Not on file   Social History Narrative    Family Status  Relation Status Death Age  . Mother Deceased     CHF  . Father Deceased     CHF  . Brother Alive     heart disease  . Brother Alive     heart disease  . Daughter Alive     healthy  . Daughter Alive     healthy    Review of Systems A complete 10 system ROS was obtained and was negative apart from what is mentioned.   Objective:   VITALS:   Filed Vitals:   02/27/15 0923  BP: 110/80  Pulse: 76  Height: 5\' 6"  (1.676 m)  Weight: 230 lb (104.327 kg)   Gen:  Appears stated age and in NAD. HEENT:  Normocephalic, atraumatic. The mucous membranes are moist. The superficial temporal arteries are without ropiness or tenderness. Cardiovascular: Regular rate and rhythm. Lungs: Clear to auscultation bilaterally. Neck: There are no carotid bruits noted bilaterally.  NEUROLOGICAL:  Orientation:  The patient is alert and oriented x 3.  Recent and remote memory are intact.  Attention span and concentration are normal.  Able to name objects and repeat without trouble.  Fund of knowledge is appropriate Cranial nerves: There is good facial symmetry. The pupils are equal round and reactive to light bilaterally. Fundoscopic exam reveals clear disc margins bilaterally. Extraocular muscles are intact and visual fields are full to confrontational testing. Speech is fluent and clear. Soft palate rises symmetrically and there is no tongue deviation. Hearing is intact to conversational tone. Tone: Tone is good throughout. Sensation: Sensation is intact to light touch and pinprick throughout (facial, trunk, extremities). Vibration is intact at the bilateral big toe.  There is no extinction with double simultaneous stimulation. There is no sensory dermatomal level identified. Coordination:  The patient has no dysdiadichokinesia or dysmetria. Motor: Strength is 5/5 in the bilateral upper and lower extremities.  Shoulder shrug is equal bilaterally.  There is no pronator drift.  There are no fasciculations noted. DTR's: Deep tendon reflexes are 2-2+/4 at the bilateral biceps,  triceps, brachioradialis, patella and achilles.  Plantar responses are downgoing bilaterally. Gait and Station: The patient is able to ambulate without difficulty. The patient is able to heel toe walk without any difficulty. The patient has some difficulty ambulating in a tandem fashion. The patient is able to stand in the Romberg position.   MOVEMENT EXAM: Tremor:  There is only tremor noted when pouring water from one glass to another and it was both hands.  She spills water.  There is no tremor with FNF.  No tremor with distraction procedures.     Assessment/Plan:   1.  Based on the video that the patient showed me, I suspect that she has psychogenic pseudoseizure.  She had very little tremor today, but psychogenic tremor may also be in the list of differential diagnosis.  -I did reassure the patient that I saw no evidence of any type of neurodegenerative tremor.  There was no evidence of Parkinson's disease.  I saw no evidence of essential tremor.  -I had a very lengthy discussion regarding psychogenic pseudoseizure.  Given that she had no recollection of the event and thrashed around in a nonphysiologic manner, I suspect that these do represent psychogenic pseudoseizure.  I talked to the patient and her husband about this.  They have noticed some help with having her hyperventilate and a bag when she has these events, but she does not even recall having the bag over her mouth.  These do not physically look like seizure.  We talked about the role that stress could play.  These often do not  arise from superficial stress.  We talked about the fact that these generally do not respond to oral medications and oftentimes require counseling.  Because patients are not aware of the nature of the events, however, we generally recommend outpatient follow seizure and safety, however.  This was discussed today.  Much greater than 50% of the 80 minute visit was spent in counseling.  Told her that she does not need f/u with a neurologist but should seek out a counselor/psychologist.  She admits to a lot of recent stress but these events started long before this.

## 2015-10-04 ENCOUNTER — Emergency Department (HOSPITAL_COMMUNITY)
Admission: EM | Admit: 2015-10-04 | Discharge: 2015-10-04 | Disposition: A | Discharge status: Home / Self Care | Payer: 59 | Attending: Emergency Medicine | Admitting: Emergency Medicine

## 2015-10-04 ENCOUNTER — Encounter (HOSPITAL_COMMUNITY): Payer: Self-pay | Admitting: Emergency Medicine

## 2015-10-04 DIAGNOSIS — M545 Low back pain, unspecified: Secondary | ICD-10-CM

## 2015-10-04 DIAGNOSIS — Z8719 Personal history of other diseases of the digestive system: Secondary | ICD-10-CM | POA: Diagnosis not present

## 2015-10-04 DIAGNOSIS — Z79899 Other long term (current) drug therapy: Secondary | ICD-10-CM | POA: Insufficient documentation

## 2015-10-04 DIAGNOSIS — F419 Anxiety disorder, unspecified: Secondary | ICD-10-CM | POA: Insufficient documentation

## 2015-10-04 DIAGNOSIS — I10 Essential (primary) hypertension: Secondary | ICD-10-CM | POA: Diagnosis not present

## 2015-10-04 DIAGNOSIS — Z8639 Personal history of other endocrine, nutritional and metabolic disease: Secondary | ICD-10-CM | POA: Diagnosis not present

## 2015-10-04 DIAGNOSIS — Z9889 Other specified postprocedural states: Secondary | ICD-10-CM | POA: Insufficient documentation

## 2015-10-04 MED ORDER — DIAZEPAM 5 MG PO TABS
5.0000 mg | ORAL_TABLET | Freq: Once | ORAL | Status: AC
  Administered 2015-10-04: 5 mg via ORAL
  Filled 2015-10-04: qty 1

## 2015-10-04 MED ORDER — KETOROLAC TROMETHAMINE 60 MG/2ML IM SOLN
60.0000 mg | Freq: Once | INTRAMUSCULAR | Status: AC
  Administered 2015-10-04: 60 mg via INTRAMUSCULAR
  Filled 2015-10-04: qty 2

## 2015-10-04 NOTE — ED Provider Notes (Signed)
CSN: [id number]     Arrival date & time 10/04/15  0532 History   First MD Initiated Contact with Patient 10/04/15 0602     Chief Complaint  Patient presents with  . Back Pain     (Consider location/radiation/quality/duration/timing/severity/associated sxs/prior Treatment) HPI Kathryn [name] is a 59 y.o. female with history of chronic low back pain comes in for evaluation of acute back pain. Patient reports over the past 3 weeks she has had worsening low back pain. She denies any injuries or trauma. She reports her discomfort is typical of her chronic low back pain is localized to her right lower back and buttock. She is tried multiple NSAIDs, hydrocodone without relief of her symptoms. Currently rates discomfort as 8.5/10. Pain characterized as sharp and stabbing. She denies any fevers, chills, numbness or weakness, urinary symptoms, pelvic pain, abdominal pain, dizziness, shortness of breath, chest pain, history of cancer. She reports she has a follow-up appointment with her PCP on Tuesday, but wanted to come to the ED today for pain relief.  Past Medical History  Diagnosis Date  . Hypertension   . Elevated cholesterol   . GERD (gastroesophageal reflux disease)   . H/O hiatal hernia   . PONV (postoperative nausea and vomiting)     felt cut with one of past knee surgeries  . Tremor   . Anxiety    Past Surgical History  Procedure Laterality Date  . Right total knee replacement   2008  . Left knee arthroscopy   last done 2008    x 3  . Abdominal hysterectomy  yrs ago  . Left shoulder arthroscopy  yrs ago  . Carpal tunnel release  yrs ago  . Lower back surgery  2005  . Cervical neck fusion  yrs ago    C 3 and C 4   . Total knee arthroplasty  01/14/2012    Procedure: TOTAL KNEE ARTHROPLASTY;  Surgeon: Johnn Hai, MD;  Location: [facility];  Service: Orthopedics;  Laterality: Left;  preop femoral nerve block   No family history on file. Social History  Substance Use Topics  .  Smoking status: Never Smoker   . Smokeless tobacco: Never Used  . Alcohol Use: No   OB History    No data available     Review of Systems A 10 point review of systems was completed and was negative except for pertinent positives and negatives as mentioned in the history of present illness     Allergies  Review of patient's allergies indicates no known allergies.  Home Medications   Prior to Admission medications   Medication Sig Start Date End Date Taking? Authorizing Provider  acetaminophen (TYLENOL) 325 MG tablet Take 650 mg by mouth every 6 (six) hours as needed for mild pain.    Yes Historical Provider, MD  ALPRAZolam (XANAX) 0.25 MG tablet Take 0.25 mg by mouth 2 (two) times daily as needed for anxiety.  12/31/14  Yes Historical Provider, MD  Aspirin-Salicylamide-Caffeine (BC HEADACHE POWDER PO) Take 1 Package by mouth daily as needed (pain headache).   Yes Historical Provider, MD  CRANBERRY PO Take 1 tablet by mouth daily.    Yes Historical Provider, MD  DULoxetine (CYMBALTA) 60 MG capsule Take 60 mg by mouth at bedtime.   Yes Historical Provider, MD  levothyroxine (SYNTHROID, LEVOTHROID) 175 MCG tablet Take 175 mcg by mouth. Every except Sunday   Yes Historical Provider, MD  metoprolol succinate (TOPROL-XL) 50 MG 24 hr tablet Take 50  mg by mouth daily. Take with or immediately following a meal.   Yes Historical Provider, MD  topiramate (TOPAMAX) 25 MG tablet Take 25 mg by mouth daily.  01/10/15  Yes Historical Provider, MD  vitamin B-12 (CYANOCOBALAMIN) 1000 MCG tablet Take 1,000 mcg by mouth daily.   Yes Historical Provider, MD   BP 139/79 mmHg  Pulse 70  Temp(Src) 97.2 F (36.2 C) (Oral)  Resp 20  SpO2 97% Physical Exam  Constitutional:  Awake, alert, nontoxic appearance.  HENT:  Head: Atraumatic.  Eyes: Right eye exhibits no discharge. Left eye exhibits no discharge.  Neck: Neck supple.  Cardiovascular: Normal rate, regular rhythm, normal heart sounds and intact  distal pulses.   Pulmonary/Chest: Effort normal. She exhibits no tenderness.  Abdominal: Soft. There is no tenderness. There is no rebound.  Musculoskeletal: She exhibits no tenderness.  Baseline ROM, no obvious new focal weakness. Tenderness diffusely throughout right paraspinal lumbar musculature as well as right buttock over SI joint. No redness, swelling, other lesions or deformities. No bony crepitus or bony tenderness.  Neurological:  Mental status and motor strength appears baseline for patient and situation. Sensation intact to light touch. Gait somewhat antalgic but no ataxia.  Skin: No rash noted.  Psychiatric: She has a normal mood and affect.  Nursing note and vitals reviewed.   ED Course  Procedures (including critical care time) Labs Review Labs Reviewed - No data to display  Imaging Review No results found. I have personally reviewed and evaluated these images and lab results as part of my medical decision-making.   EKG Interpretation None     Meds given in ED:  Medications  ketorolac (TORADOL) injection 60 mg (60 mg Intramuscular Given 10/04/15 0635)  diazepam (VALIUM) tablet 5 mg (5 mg Oral Given 10/04/15 0636)    New Prescriptions   No medications on file   Filed Vitals:   10/04/15 0540  BP: 139/79  Pulse: 70  Temp: 97.2 F (36.2 C)  Resp: 20   MDM  Shamrock is a 59 y.o. female who presents to the ED for back pain. Patient reports this is an acute exacerbation of her chronic back pain. Denies any other medical symptoms. No pathologic back pain red flags. States she has an appointment with her PCP on Tuesday, came today for pain relief. On arrival, patient appears well, is hemodynamically stable with normal vital signs. She has a nonfocal neuro exam. Remainder of physical exam is consistent with musculoskeletal back pain. No evidence of other acute or emergent pathology. Patient treated in the ED with IM Toradol, Valium. Discussed follow-up with  PCP for regularly scheduled appointment on Tuesday. Patient agrees with this plan. She voices no other questions or concerns at this time. Overall, patient appears well, nontoxic, hemodynamically stable and is appropriate for discharge.  Final diagnoses:  Right-sided low back pain without sciatica        Comer Locket, PA-C 10/04/15 [place], DO 10/04/15 0700

## 2015-10-04 NOTE — ED Notes (Signed)
Pt complains of chronic back pain for three weeks, has an appt with Dr Maxie Better on Tuesday but states she woke up with severe pain this am that is radiating down her right leg

## 2015-10-04 NOTE — ED Notes (Signed)
Pt c/o low back pain that is worse with movement. She denies recent injury. She reports this pain has been on going x few weeks and she has an appt with her PCP but this am it is unbearable.

## 2015-10-04 NOTE — Discharge Instructions (Signed)
You were treated in the ED today for your low back pain. It is important to follow up with your doctor for regularly scheduled appointment on Tuesday for reevaluation and further management of your symptoms. Return to ED for any new or worsening symptoms. This includes, but is not limited to, fevers, chills, numbness or weakness, loss of bowel or bladder function, difficulties walking.  Back Pain, Adult Back pain is very common in adults.The cause of back pain is rarely dangerous and the pain often gets better over time.The cause of your back pain may not be known. Some common causes of back pain include:  Strain of the muscles or ligaments supporting the spine.  Wear and tear (degeneration) of the spinal disks.  Arthritis.  Direct injury to the back. For many people, back pain may return. Since back pain is rarely dangerous, most people can learn to manage this condition on their own. HOME CARE INSTRUCTIONS Watch your back pain for any changes. The following actions may help to lessen any discomfort you are feeling:  Remain active. It is stressful on your back to sit or stand in one place for long periods of time. Do not sit, drive, or stand in one place for more than 30 minutes at a time. Take short walks on even surfaces as soon as you are able.Try to increase the length of time you walk each day.  Exercise regularly as directed by your health care provider. Exercise helps your back heal faster. It also helps avoid future injury by keeping your muscles strong and flexible.  Do not stay in bed.Resting more than 1-2 days can delay your recovery.  Pay attention to your body when you bend and lift. The most comfortable positions are those that put less stress on your recovering back. Always use proper lifting techniques, including:  Bending your knees.  Keeping the load close to your body.  Avoiding twisting.  Find a comfortable position to sleep. Use a firm mattress and lie on your  side with your knees slightly bent. If you lie on your back, put a pillow under your knees.  Avoid feeling anxious or stressed.Stress increases muscle tension and can worsen back pain.It is important to recognize when you are anxious or stressed and learn ways to manage it, such as with exercise.  Take medicines only as directed by your health care provider. Over-the-counter medicines to reduce pain and inflammation are often the most helpful.Your health care provider may prescribe muscle relaxant drugs.These medicines help dull your pain so you can more quickly return to your normal activities and healthy exercise.  Apply ice to the injured area:  Put ice in a plastic bag.  Place a towel between your skin and the bag.  Leave the ice on for 20 minutes, 2-3 times a day for the first 2-3 days. After that, ice and heat may be alternated to reduce pain and spasms.  Maintain a healthy weight. Excess weight puts extra stress on your back and makes it difficult to maintain good posture. SEEK MEDICAL CARE IF:  You have pain that is not relieved with rest or medicine.  You have increasing pain going down into the legs or buttocks.  You have pain that does not improve in one week.  You have night pain.  You lose weight.  You have a fever or chills. SEEK IMMEDIATE MEDICAL CARE IF:   You develop new bowel or bladder control problems.  You have unusual weakness or numbness in your arms  or legs.  You develop nausea or vomiting.  You develop abdominal pain.  You feel faint.   This information is not intended to replace advice given to you by your health care provider. Make sure you discuss any questions you have with your health care provider.   Document Released: 11/09/2005 Document Revised: 11/30/2014 Document Reviewed: 03/13/2014 Elsevier Interactive Patient Education [employer].

## 2015-11-24 HISTORY — PX: BREAST LUMPECTOMY: SHX2

## 2016-06-15 DIAGNOSIS — E784 Other hyperlipidemia: Secondary | ICD-10-CM | POA: Diagnosis not present

## 2016-06-15 DIAGNOSIS — E038 Other specified hypothyroidism: Secondary | ICD-10-CM | POA: Diagnosis not present

## 2016-06-15 DIAGNOSIS — R040 Epistaxis: Secondary | ICD-10-CM | POA: Diagnosis not present

## 2016-07-05 DIAGNOSIS — I10 Essential (primary) hypertension: Secondary | ICD-10-CM | POA: Diagnosis not present

## 2016-07-05 DIAGNOSIS — K529 Noninfective gastroenteritis and colitis, unspecified: Secondary | ICD-10-CM | POA: Diagnosis not present

## 2016-07-05 DIAGNOSIS — I4891 Unspecified atrial fibrillation: Secondary | ICD-10-CM | POA: Diagnosis not present

## 2016-07-05 DIAGNOSIS — E860 Dehydration: Secondary | ICD-10-CM | POA: Diagnosis not present

## 2016-07-05 DIAGNOSIS — E861 Hypovolemia: Secondary | ICD-10-CM | POA: Diagnosis not present

## 2016-07-05 DIAGNOSIS — I472 Ventricular tachycardia: Secondary | ICD-10-CM | POA: Diagnosis not present

## 2016-07-05 DIAGNOSIS — E876 Hypokalemia: Secondary | ICD-10-CM | POA: Diagnosis not present

## 2016-07-05 DIAGNOSIS — I499 Cardiac arrhythmia, unspecified: Secondary | ICD-10-CM | POA: Diagnosis not present

## 2016-07-05 DIAGNOSIS — D509 Iron deficiency anemia, unspecified: Secondary | ICD-10-CM | POA: Diagnosis not present

## 2016-07-05 DIAGNOSIS — R509 Fever, unspecified: Secondary | ICD-10-CM | POA: Diagnosis not present

## 2016-07-05 DIAGNOSIS — I480 Paroxysmal atrial fibrillation: Secondary | ICD-10-CM | POA: Diagnosis not present

## 2016-07-05 DIAGNOSIS — F329 Major depressive disorder, single episode, unspecified: Secondary | ICD-10-CM | POA: Diagnosis not present

## 2016-07-05 DIAGNOSIS — I471 Supraventricular tachycardia: Secondary | ICD-10-CM | POA: Diagnosis not present

## 2016-07-05 DIAGNOSIS — N390 Urinary tract infection, site not specified: Secondary | ICD-10-CM | POA: Diagnosis not present

## 2016-07-05 DIAGNOSIS — N3000 Acute cystitis without hematuria: Secondary | ICD-10-CM | POA: Diagnosis not present

## 2016-07-05 DIAGNOSIS — E039 Hypothyroidism, unspecified: Secondary | ICD-10-CM | POA: Diagnosis not present

## 2016-07-05 DIAGNOSIS — R197 Diarrhea, unspecified: Secondary | ICD-10-CM | POA: Diagnosis not present

## 2016-07-05 DIAGNOSIS — E785 Hyperlipidemia, unspecified: Secondary | ICD-10-CM | POA: Diagnosis not present

## 2016-07-05 DIAGNOSIS — I517 Cardiomegaly: Secondary | ICD-10-CM | POA: Diagnosis not present

## 2016-07-05 DIAGNOSIS — A419 Sepsis, unspecified organism: Secondary | ICD-10-CM | POA: Diagnosis not present

## 2016-07-05 DIAGNOSIS — E872 Acidosis: Secondary | ICD-10-CM | POA: Diagnosis not present

## 2016-07-06 DIAGNOSIS — K529 Noninfective gastroenteritis and colitis, unspecified: Secondary | ICD-10-CM | POA: Diagnosis not present

## 2016-07-06 DIAGNOSIS — A419 Sepsis, unspecified organism: Secondary | ICD-10-CM | POA: Diagnosis not present

## 2016-07-06 DIAGNOSIS — E861 Hypovolemia: Secondary | ICD-10-CM | POA: Diagnosis not present

## 2016-07-06 DIAGNOSIS — N390 Urinary tract infection, site not specified: Secondary | ICD-10-CM | POA: Diagnosis not present

## 2016-07-06 DIAGNOSIS — I471 Supraventricular tachycardia: Secondary | ICD-10-CM | POA: Diagnosis not present

## 2016-07-06 DIAGNOSIS — I517 Cardiomegaly: Secondary | ICD-10-CM | POA: Diagnosis not present

## 2016-07-06 DIAGNOSIS — R197 Diarrhea, unspecified: Secondary | ICD-10-CM | POA: Diagnosis not present

## 2016-07-06 DIAGNOSIS — E876 Hypokalemia: Secondary | ICD-10-CM | POA: Diagnosis not present

## 2016-07-07 DIAGNOSIS — E876 Hypokalemia: Secondary | ICD-10-CM | POA: Diagnosis not present

## 2016-07-07 DIAGNOSIS — E861 Hypovolemia: Secondary | ICD-10-CM | POA: Diagnosis not present

## 2016-07-07 DIAGNOSIS — N390 Urinary tract infection, site not specified: Secondary | ICD-10-CM | POA: Diagnosis not present

## 2016-07-07 DIAGNOSIS — A419 Sepsis, unspecified organism: Secondary | ICD-10-CM | POA: Diagnosis not present

## 2016-07-07 DIAGNOSIS — K529 Noninfective gastroenteritis and colitis, unspecified: Secondary | ICD-10-CM | POA: Diagnosis not present

## 2016-07-07 DIAGNOSIS — I471 Supraventricular tachycardia: Secondary | ICD-10-CM | POA: Diagnosis not present

## 2016-07-08 DIAGNOSIS — N390 Urinary tract infection, site not specified: Secondary | ICD-10-CM | POA: Diagnosis not present

## 2016-07-08 DIAGNOSIS — I499 Cardiac arrhythmia, unspecified: Secondary | ICD-10-CM | POA: Diagnosis not present

## 2016-07-08 DIAGNOSIS — I472 Ventricular tachycardia: Secondary | ICD-10-CM | POA: Diagnosis not present

## 2016-07-08 DIAGNOSIS — I471 Supraventricular tachycardia: Secondary | ICD-10-CM | POA: Diagnosis not present

## 2016-07-08 DIAGNOSIS — A419 Sepsis, unspecified organism: Secondary | ICD-10-CM | POA: Diagnosis not present

## 2016-10-22 ENCOUNTER — Other Ambulatory Visit: Payer: Self-pay | Admitting: Endocrinology

## 2016-10-22 DIAGNOSIS — Z1231 Encounter for screening mammogram for malignant neoplasm of breast: Secondary | ICD-10-CM

## 2016-10-23 DIAGNOSIS — N6452 Nipple discharge: Secondary | ICD-10-CM | POA: Diagnosis not present

## 2016-10-23 DIAGNOSIS — Z6839 Body mass index (BMI) 39.0-39.9, adult: Secondary | ICD-10-CM | POA: Diagnosis not present

## 2016-10-23 DIAGNOSIS — C801 Malignant (primary) neoplasm, unspecified: Secondary | ICD-10-CM

## 2016-10-23 HISTORY — DX: Malignant (primary) neoplasm, unspecified: C80.1

## 2016-10-27 ENCOUNTER — Other Ambulatory Visit: Payer: Self-pay | Admitting: Endocrinology

## 2016-10-27 DIAGNOSIS — N6452 Nipple discharge: Secondary | ICD-10-CM

## 2016-11-02 ENCOUNTER — Ambulatory Visit
Admission: RE | Admit: 2016-11-02 | Discharge: 2016-11-02 | Disposition: A | Discharge status: Home / Self Care | Payer: BLUE CROSS/BLUE SHIELD | Source: Ambulatory Visit | Attending: Endocrinology | Admitting: Endocrinology

## 2016-11-02 ENCOUNTER — Other Ambulatory Visit: Payer: Self-pay | Admitting: Endocrinology

## 2016-11-02 DIAGNOSIS — N6452 Nipple discharge: Secondary | ICD-10-CM

## 2016-11-02 DIAGNOSIS — IMO0002 Reserved for concepts with insufficient information to code with codable children: Secondary | ICD-10-CM

## 2016-11-02 DIAGNOSIS — R229 Localized swelling, mass and lump, unspecified: Principal | ICD-10-CM

## 2016-11-02 DIAGNOSIS — N6313 Unspecified lump in the right breast, lower outer quadrant: Secondary | ICD-10-CM | POA: Diagnosis not present

## 2016-11-03 ENCOUNTER — Other Ambulatory Visit: Payer: Self-pay | Admitting: Endocrinology

## 2016-11-03 DIAGNOSIS — R229 Localized swelling, mass and lump, unspecified: Principal | ICD-10-CM

## 2016-11-03 DIAGNOSIS — IMO0002 Reserved for concepts with insufficient information to code with codable children: Secondary | ICD-10-CM

## 2016-11-04 ENCOUNTER — Ambulatory Visit
Admission: RE | Admit: 2016-11-04 | Discharge: 2016-11-04 | Disposition: A | Discharge status: Home / Self Care | Payer: BLUE CROSS/BLUE SHIELD | Source: Ambulatory Visit | Attending: Endocrinology | Admitting: Endocrinology

## 2016-11-04 DIAGNOSIS — R229 Localized swelling, mass and lump, unspecified: Principal | ICD-10-CM

## 2016-11-04 DIAGNOSIS — N6313 Unspecified lump in the right breast, lower outer quadrant: Secondary | ICD-10-CM | POA: Diagnosis not present

## 2016-11-04 DIAGNOSIS — IMO0002 Reserved for concepts with insufficient information to code with codable children: Secondary | ICD-10-CM

## 2016-11-04 DIAGNOSIS — D241 Benign neoplasm of right breast: Secondary | ICD-10-CM | POA: Diagnosis not present

## 2016-11-04 DIAGNOSIS — N62 Hypertrophy of breast: Secondary | ICD-10-CM | POA: Diagnosis not present

## 2016-11-04 DIAGNOSIS — N6310 Unspecified lump in the right breast, unspecified quadrant: Secondary | ICD-10-CM | POA: Diagnosis not present

## 2016-11-23 HISTORY — PX: BREAST EXCISIONAL BIOPSY: SUR124

## 2016-11-25 ENCOUNTER — Other Ambulatory Visit: Payer: Self-pay | Admitting: General Surgery

## 2016-11-25 DIAGNOSIS — N6310 Unspecified lump in the right breast, unspecified quadrant: Secondary | ICD-10-CM | POA: Diagnosis not present

## 2016-12-01 ENCOUNTER — Other Ambulatory Visit: Payer: Self-pay | Admitting: General Surgery

## 2016-12-01 DIAGNOSIS — N6310 Unspecified lump in the right breast, unspecified quadrant: Secondary | ICD-10-CM

## 2016-12-10 ENCOUNTER — Encounter (HOSPITAL_BASED_OUTPATIENT_CLINIC_OR_DEPARTMENT_OTHER): Payer: Self-pay | Admitting: *Deleted

## 2016-12-14 ENCOUNTER — Ambulatory Visit
Admission: RE | Admit: 2016-12-14 | Discharge: 2016-12-14 | Disposition: A | Discharge status: Home / Self Care | Payer: BLUE CROSS/BLUE SHIELD | Source: Ambulatory Visit | Attending: General Surgery | Admitting: General Surgery

## 2016-12-14 ENCOUNTER — Other Ambulatory Visit: Payer: Self-pay

## 2016-12-14 ENCOUNTER — Encounter (HOSPITAL_BASED_OUTPATIENT_CLINIC_OR_DEPARTMENT_OTHER)
Admission: RE | Admit: 2016-12-14 | Discharge: 2016-12-14 | Disposition: A | Discharge status: Home / Self Care | Payer: BLUE CROSS/BLUE SHIELD | Source: Ambulatory Visit | Attending: General Surgery | Admitting: General Surgery

## 2016-12-14 DIAGNOSIS — Z9889 Other specified postprocedural states: Secondary | ICD-10-CM | POA: Diagnosis not present

## 2016-12-14 DIAGNOSIS — N6452 Nipple discharge: Secondary | ICD-10-CM | POA: Diagnosis not present

## 2016-12-14 DIAGNOSIS — N6011 Diffuse cystic mastopathy of right breast: Secondary | ICD-10-CM | POA: Diagnosis not present

## 2016-12-14 DIAGNOSIS — I4891 Unspecified atrial fibrillation: Secondary | ICD-10-CM | POA: Diagnosis not present

## 2016-12-14 DIAGNOSIS — E7800 Pure hypercholesterolemia, unspecified: Secondary | ICD-10-CM | POA: Diagnosis not present

## 2016-12-14 DIAGNOSIS — Z90710 Acquired absence of both cervix and uterus: Secondary | ICD-10-CM | POA: Diagnosis not present

## 2016-12-14 DIAGNOSIS — K219 Gastro-esophageal reflux disease without esophagitis: Secondary | ICD-10-CM | POA: Diagnosis not present

## 2016-12-14 DIAGNOSIS — Z8249 Family history of ischemic heart disease and other diseases of the circulatory system: Secondary | ICD-10-CM | POA: Diagnosis not present

## 2016-12-14 DIAGNOSIS — E039 Hypothyroidism, unspecified: Secondary | ICD-10-CM | POA: Diagnosis not present

## 2016-12-14 DIAGNOSIS — N62 Hypertrophy of breast: Secondary | ICD-10-CM | POA: Diagnosis not present

## 2016-12-14 DIAGNOSIS — I10 Essential (primary) hypertension: Secondary | ICD-10-CM | POA: Diagnosis not present

## 2016-12-14 DIAGNOSIS — I4510 Unspecified right bundle-branch block: Secondary | ICD-10-CM | POA: Diagnosis not present

## 2016-12-14 DIAGNOSIS — M1990 Unspecified osteoarthritis, unspecified site: Secondary | ICD-10-CM | POA: Diagnosis not present

## 2016-12-14 DIAGNOSIS — N6081 Other benign mammary dysplasias of right breast: Secondary | ICD-10-CM | POA: Diagnosis not present

## 2016-12-14 DIAGNOSIS — F419 Anxiety disorder, unspecified: Secondary | ICD-10-CM | POA: Diagnosis not present

## 2016-12-14 DIAGNOSIS — F329 Major depressive disorder, single episode, unspecified: Secondary | ICD-10-CM | POA: Diagnosis not present

## 2016-12-14 DIAGNOSIS — Z8261 Family history of arthritis: Secondary | ICD-10-CM | POA: Diagnosis not present

## 2016-12-14 DIAGNOSIS — Z8371 Family history of colonic polyps: Secondary | ICD-10-CM | POA: Diagnosis not present

## 2016-12-14 DIAGNOSIS — N6310 Unspecified lump in the right breast, unspecified quadrant: Secondary | ICD-10-CM

## 2016-12-14 DIAGNOSIS — Z79899 Other long term (current) drug therapy: Secondary | ICD-10-CM | POA: Diagnosis not present

## 2016-12-14 DIAGNOSIS — Z823 Family history of stroke: Secondary | ICD-10-CM | POA: Diagnosis not present

## 2016-12-14 DIAGNOSIS — R928 Other abnormal and inconclusive findings on diagnostic imaging of breast: Secondary | ICD-10-CM | POA: Diagnosis present

## 2016-12-14 DIAGNOSIS — Z90722 Acquired absence of ovaries, bilateral: Secondary | ICD-10-CM | POA: Diagnosis not present

## 2016-12-14 DIAGNOSIS — D241 Benign neoplasm of right breast: Secondary | ICD-10-CM | POA: Diagnosis not present

## 2016-12-14 NOTE — Progress Notes (Signed)
Pt arrived for EKG. Also gave pt Boost drink with instruction to refrigerate when home and drink DOS no later than 0415. Pt verbalized understanding.

## 2016-12-15 ENCOUNTER — Encounter (HOSPITAL_BASED_OUTPATIENT_CLINIC_OR_DEPARTMENT_OTHER)
Admission: RE | Disposition: A | Discharge status: Home / Self Care | Payer: Self-pay | Source: Ambulatory Visit | Attending: General Surgery

## 2016-12-15 ENCOUNTER — Ambulatory Visit
Admission: RE | Admit: 2016-12-15 | Discharge: 2016-12-15 | Disposition: A | Discharge status: Home / Self Care | Payer: BLUE CROSS/BLUE SHIELD | Source: Ambulatory Visit | Attending: General Surgery | Admitting: General Surgery

## 2016-12-15 ENCOUNTER — Ambulatory Visit (HOSPITAL_BASED_OUTPATIENT_CLINIC_OR_DEPARTMENT_OTHER)
Admission: RE | Admit: 2016-12-15 | Discharge: 2016-12-15 | Disposition: A | Discharge status: Home / Self Care | Payer: BLUE CROSS/BLUE SHIELD | Source: Ambulatory Visit | Attending: General Surgery | Admitting: General Surgery

## 2016-12-15 ENCOUNTER — Ambulatory Visit (HOSPITAL_BASED_OUTPATIENT_CLINIC_OR_DEPARTMENT_OTHER): Payer: BLUE CROSS/BLUE SHIELD | Admitting: Anesthesiology

## 2016-12-15 ENCOUNTER — Encounter (HOSPITAL_BASED_OUTPATIENT_CLINIC_OR_DEPARTMENT_OTHER): Payer: Self-pay

## 2016-12-15 DIAGNOSIS — N6011 Diffuse cystic mastopathy of right breast: Secondary | ICD-10-CM | POA: Diagnosis not present

## 2016-12-15 DIAGNOSIS — Z79899 Other long term (current) drug therapy: Secondary | ICD-10-CM | POA: Insufficient documentation

## 2016-12-15 DIAGNOSIS — Z90722 Acquired absence of ovaries, bilateral: Secondary | ICD-10-CM | POA: Insufficient documentation

## 2016-12-15 DIAGNOSIS — I10 Essential (primary) hypertension: Secondary | ICD-10-CM | POA: Diagnosis not present

## 2016-12-15 DIAGNOSIS — F419 Anxiety disorder, unspecified: Secondary | ICD-10-CM | POA: Diagnosis not present

## 2016-12-15 DIAGNOSIS — Z8261 Family history of arthritis: Secondary | ICD-10-CM | POA: Insufficient documentation

## 2016-12-15 DIAGNOSIS — N6310 Unspecified lump in the right breast, unspecified quadrant: Secondary | ICD-10-CM

## 2016-12-15 DIAGNOSIS — K219 Gastro-esophageal reflux disease without esophagitis: Secondary | ICD-10-CM | POA: Insufficient documentation

## 2016-12-15 DIAGNOSIS — F329 Major depressive disorder, single episode, unspecified: Secondary | ICD-10-CM | POA: Insufficient documentation

## 2016-12-15 DIAGNOSIS — K449 Diaphragmatic hernia without obstruction or gangrene: Secondary | ICD-10-CM | POA: Diagnosis not present

## 2016-12-15 DIAGNOSIS — E7800 Pure hypercholesterolemia, unspecified: Secondary | ICD-10-CM | POA: Insufficient documentation

## 2016-12-15 DIAGNOSIS — Z823 Family history of stroke: Secondary | ICD-10-CM | POA: Insufficient documentation

## 2016-12-15 DIAGNOSIS — Z90710 Acquired absence of both cervix and uterus: Secondary | ICD-10-CM | POA: Insufficient documentation

## 2016-12-15 DIAGNOSIS — M1990 Unspecified osteoarthritis, unspecified site: Secondary | ICD-10-CM | POA: Insufficient documentation

## 2016-12-15 DIAGNOSIS — I4510 Unspecified right bundle-branch block: Secondary | ICD-10-CM | POA: Insufficient documentation

## 2016-12-15 DIAGNOSIS — E039 Hypothyroidism, unspecified: Secondary | ICD-10-CM | POA: Insufficient documentation

## 2016-12-15 DIAGNOSIS — N6452 Nipple discharge: Secondary | ICD-10-CM | POA: Diagnosis not present

## 2016-12-15 DIAGNOSIS — N6081 Other benign mammary dysplasias of right breast: Secondary | ICD-10-CM | POA: Diagnosis not present

## 2016-12-15 DIAGNOSIS — Z8249 Family history of ischemic heart disease and other diseases of the circulatory system: Secondary | ICD-10-CM | POA: Insufficient documentation

## 2016-12-15 DIAGNOSIS — Z9889 Other specified postprocedural states: Secondary | ICD-10-CM | POA: Diagnosis not present

## 2016-12-15 DIAGNOSIS — N62 Hypertrophy of breast: Secondary | ICD-10-CM | POA: Insufficient documentation

## 2016-12-15 DIAGNOSIS — I4891 Unspecified atrial fibrillation: Secondary | ICD-10-CM | POA: Insufficient documentation

## 2016-12-15 DIAGNOSIS — D241 Benign neoplasm of right breast: Secondary | ICD-10-CM | POA: Diagnosis not present

## 2016-12-15 DIAGNOSIS — Z8371 Family history of colonic polyps: Secondary | ICD-10-CM | POA: Insufficient documentation

## 2016-12-15 HISTORY — DX: Hypothyroidism, unspecified: E03.9

## 2016-12-15 HISTORY — PX: BREAST LUMPECTOMY WITH RADIOACTIVE SEED LOCALIZATION: SHX6424

## 2016-12-15 SURGERY — BREAST LUMPECTOMY WITH RADIOACTIVE SEED LOCALIZATION
Anesthesia: General | Site: Breast | Laterality: Right

## 2016-12-15 MED ORDER — LACTATED RINGERS IV SOLN
INTRAVENOUS | Status: DC
  Administered 2016-12-15 (×2): via INTRAVENOUS

## 2016-12-15 MED ORDER — LIDOCAINE 2% (20 MG/ML) 5 ML SYRINGE
INTRAMUSCULAR | Status: DC | PRN
  Administered 2016-12-15: 100 mg via INTRAVENOUS

## 2016-12-15 MED ORDER — OXYCODONE HCL 5 MG/5ML PO SOLN: 5.0000 mg | Freq: Once | ORAL | Status: DC | PRN

## 2016-12-15 MED ORDER — ONDANSETRON HCL 4 MG/2ML IJ SOLN
INTRAMUSCULAR | Status: AC
  Filled 2016-12-15: qty 2

## 2016-12-15 MED ORDER — HYDROMORPHONE HCL 1 MG/ML IJ SOLN: 0.2500 mg | INTRAMUSCULAR | Status: DC | PRN

## 2016-12-15 MED ORDER — GABAPENTIN 300 MG PO CAPS
300.0000 mg | ORAL_CAPSULE | ORAL | Status: AC
  Administered 2016-12-15: 300 mg via ORAL

## 2016-12-15 MED ORDER — LACTATED RINGERS IV SOLN: INTRAVENOUS | Status: DC

## 2016-12-15 MED ORDER — EPHEDRINE 5 MG/ML INJ
INTRAVENOUS | Status: AC
  Filled 2016-12-15: qty 10

## 2016-12-15 MED ORDER — BUPIVACAINE-EPINEPHRINE (PF) 0.5% -1:200000 IJ SOLN
INTRAMUSCULAR | Status: AC
  Filled 2016-12-15: qty 30

## 2016-12-15 MED ORDER — DEXAMETHASONE SODIUM PHOSPHATE 4 MG/ML IJ SOLN
INTRAMUSCULAR | Status: DC | PRN
  Administered 2016-12-15: 10 mg via INTRAVENOUS

## 2016-12-15 MED ORDER — MEPERIDINE HCL 25 MG/ML IJ SOLN: 6.2500 mg | INTRAMUSCULAR | Status: DC | PRN

## 2016-12-15 MED ORDER — SCOPOLAMINE 1 MG/3DAYS TD PT72
1.0000 | MEDICATED_PATCH | Freq: Once | TRANSDERMAL | Status: AC | PRN
  Administered 2016-12-15: 1 via TRANSDERMAL

## 2016-12-15 MED ORDER — FENTANYL CITRATE (PF) 100 MCG/2ML IJ SOLN
50.0000 ug | INTRAMUSCULAR | Status: DC | PRN
  Administered 2016-12-15: 100 ug via INTRAVENOUS

## 2016-12-15 MED ORDER — ONDANSETRON HCL 4 MG/2ML IJ SOLN
INTRAMUSCULAR | Status: DC | PRN
  Administered 2016-12-15: 4 mg via INTRAVENOUS

## 2016-12-15 MED ORDER — PROPOFOL 10 MG/ML IV BOLUS
INTRAVENOUS | Status: DC | PRN
  Administered 2016-12-15: 200 mg via INTRAVENOUS

## 2016-12-15 MED ORDER — EPHEDRINE SULFATE-NACL 50-0.9 MG/10ML-% IV SOSY
PREFILLED_SYRINGE | INTRAVENOUS | Status: DC | PRN
  Administered 2016-12-15 (×2): 10 mg via INTRAVENOUS

## 2016-12-15 MED ORDER — GABAPENTIN 300 MG PO CAPS
ORAL_CAPSULE | ORAL | Status: AC
  Filled 2016-12-15: qty 1

## 2016-12-15 MED ORDER — GLYCOPYRROLATE 0.2 MG/ML IV SOSY
PREFILLED_SYRINGE | INTRAVENOUS | Status: DC | PRN
  Administered 2016-12-15: .2 mg via INTRAVENOUS

## 2016-12-15 MED ORDER — ACETAMINOPHEN 500 MG PO TABS
1000.0000 mg | ORAL_TABLET | ORAL | Status: AC
  Administered 2016-12-15: 1000 mg via ORAL

## 2016-12-15 MED ORDER — TRAMADOL HCL 50 MG PO TABS: 50.0000 mg | ORAL_TABLET | Freq: Four times a day (QID) | ORAL | 1 refills | Status: DC | PRN

## 2016-12-15 MED ORDER — CEFAZOLIN SODIUM-DEXTROSE 2-4 GM/100ML-% IV SOLN
INTRAVENOUS | Status: AC
  Filled 2016-12-15: qty 100

## 2016-12-15 MED ORDER — BUPIVACAINE HCL (PF) 0.25 % IJ SOLN
INTRAMUSCULAR | Status: AC
  Filled 2016-12-15: qty 30

## 2016-12-15 MED ORDER — DEXAMETHASONE SODIUM PHOSPHATE 10 MG/ML IJ SOLN
INTRAMUSCULAR | Status: AC
  Filled 2016-12-15: qty 1

## 2016-12-15 MED ORDER — CEFAZOLIN SODIUM-DEXTROSE 2-4 GM/100ML-% IV SOLN
2.0000 g | INTRAVENOUS | Status: AC
  Administered 2016-12-15: 2 g via INTRAVENOUS

## 2016-12-15 MED ORDER — MIDAZOLAM HCL 2 MG/2ML IJ SOLN
INTRAMUSCULAR | Status: AC
  Filled 2016-12-15: qty 2

## 2016-12-15 MED ORDER — BUPIVACAINE HCL (PF) 0.25 % IJ SOLN
INTRAMUSCULAR | Status: DC | PRN
  Administered 2016-12-15: 20 mL

## 2016-12-15 MED ORDER — FENTANYL CITRATE (PF) 100 MCG/2ML IJ SOLN
INTRAMUSCULAR | Status: AC
  Filled 2016-12-15: qty 2

## 2016-12-15 MED ORDER — OXYCODONE HCL 5 MG PO TABS: 5.0000 mg | ORAL_TABLET | Freq: Once | ORAL | Status: DC | PRN

## 2016-12-15 MED ORDER — LIDOCAINE 2% (20 MG/ML) 5 ML SYRINGE
INTRAMUSCULAR | Status: AC
  Filled 2016-12-15: qty 5

## 2016-12-15 MED ORDER — SCOPOLAMINE 1 MG/3DAYS TD PT72
MEDICATED_PATCH | TRANSDERMAL | Status: AC
  Filled 2016-12-15: qty 1

## 2016-12-15 MED ORDER — PROMETHAZINE HCL 25 MG/ML IJ SOLN: 6.2500 mg | INTRAMUSCULAR | Status: DC | PRN

## 2016-12-15 MED ORDER — MIDAZOLAM HCL 2 MG/2ML IJ SOLN
1.0000 mg | INTRAMUSCULAR | Status: DC | PRN
  Administered 2016-12-15: 2 mg via INTRAVENOUS

## 2016-12-15 MED ORDER — ACETAMINOPHEN 500 MG PO TABS
ORAL_TABLET | ORAL | Status: AC
  Filled 2016-12-15: qty 2

## 2016-12-15 MED ORDER — PROPOFOL 10 MG/ML IV BOLUS
INTRAVENOUS | Status: AC
  Filled 2016-12-15: qty 20

## 2016-12-15 SURGICAL SUPPLY — 46 items
ADH SKN CLS APL DERMABOND .7 (GAUZE/BANDAGES/DRESSINGS) ×1
BINDER BREAST XXLRG (GAUZE/BANDAGES/DRESSINGS) ×1 IMPLANT
BLADE SURG 15 STRL LF DISP TIS (BLADE) ×1 IMPLANT
BLADE SURG 15 STRL SS (BLADE) ×2
CHLORAPREP W/TINT 26ML (MISCELLANEOUS) ×2 IMPLANT
CLIP TI WIDE RED SMALL 6 (CLIP) ×1 IMPLANT
COVER BACK TABLE 60X90IN (DRAPES) ×2 IMPLANT
COVER MAYO STAND STRL (DRAPES) ×2 IMPLANT
COVER PROBE W GEL 5X96 (DRAPES) ×2 IMPLANT
DERMABOND ADVANCED (GAUZE/BANDAGES/DRESSINGS) ×1
DERMABOND ADVANCED .7 DNX12 (GAUZE/BANDAGES/DRESSINGS) ×1 IMPLANT
DEVICE DUBIN W/COMP PLATE 8390 (MISCELLANEOUS) ×2 IMPLANT
DRAPE LAPAROSCOPIC ABDOMINAL (DRAPES) ×2 IMPLANT
DRAPE UTILITY XL STRL (DRAPES) ×2 IMPLANT
ELECT COATED BLADE 2.86 ST (ELECTRODE) ×2 IMPLANT
ELECT REM PT RETURN 9FT ADLT (ELECTROSURGICAL) ×2
ELECTRODE REM PT RTRN 9FT ADLT (ELECTROSURGICAL) ×1 IMPLANT
GLOVE BIO SURGEON STRL SZ7 (GLOVE) ×4 IMPLANT
GLOVE BIOGEL PI IND STRL 7.0 (GLOVE) IMPLANT
GLOVE BIOGEL PI IND STRL 7.5 (GLOVE) ×1 IMPLANT
GLOVE BIOGEL PI INDICATOR 7.0 (GLOVE) ×1
GLOVE BIOGEL PI INDICATOR 7.5 (GLOVE) ×2
GLOVE ECLIPSE 6.5 STRL STRAW (GLOVE) ×1 IMPLANT
GLOVE SURG SYN 7.5  E (GLOVE) ×1
GLOVE SURG SYN 7.5 E (GLOVE) ×1 IMPLANT
GLOVE SURG SYN 7.5 PF PI (GLOVE) IMPLANT
GOWN STRL REUS W/ TWL LRG LVL3 (GOWN DISPOSABLE) ×2 IMPLANT
GOWN STRL REUS W/TWL LRG LVL3 (GOWN DISPOSABLE) ×6
HEMOSTAT ARISTA ABSORB 3G PWDR (MISCELLANEOUS) ×1 IMPLANT
KIT MARKER MARGIN INK (KITS) ×2 IMPLANT
NDL HYPO 25X1 1.5 SAFETY (NEEDLE) ×1 IMPLANT
NEEDLE HYPO 25X1 1.5 SAFETY (NEEDLE) ×2 IMPLANT
NS IRRIG 1000ML POUR BTL (IV SOLUTION) ×1 IMPLANT
PACK BASIN DAY SURGERY FS (CUSTOM PROCEDURE TRAY) ×2 IMPLANT
PENCIL BUTTON HOLSTER BLD 10FT (ELECTRODE) ×2 IMPLANT
SLEEVE SCD COMPRESS KNEE MED (MISCELLANEOUS) ×2 IMPLANT
SPONGE LAP 4X18 X RAY DECT (DISPOSABLE) ×2 IMPLANT
STRIP CLOSURE SKIN 1/2X4 (GAUZE/BANDAGES/DRESSINGS) ×2 IMPLANT
SUT MON AB 5-0 PS2 18 (SUTURE) ×1 IMPLANT
SUT VIC AB 2-0 SH 27 (SUTURE) ×2
SUT VIC AB 2-0 SH 27XBRD (SUTURE) ×1 IMPLANT
SUT VIC AB 3-0 SH 27 (SUTURE) ×2
SUT VIC AB 3-0 SH 27X BRD (SUTURE) ×1 IMPLANT
SYR CONTROL 10ML LL (SYRINGE) ×2 IMPLANT
TOWEL OR 17X24 6PK STRL BLUE (TOWEL DISPOSABLE) ×2 IMPLANT
TOWEL OR NON WOVEN STRL DISP B (DISPOSABLE) ×2 IMPLANT

## 2016-12-15 NOTE — Anesthesia Preprocedure Evaluation (Addendum)
Anesthesia Evaluation  Patient identified by MRN, date of birth, ID band Patient awake    Reviewed: Allergy & Precautions, NPO status , Patient's Chart, lab work & pertinent test results, reviewed documented beta blocker date and time   History of Anesthesia Complications (+) PONV and history of anesthetic complications  Airway Mallampati: III  TM Distance: >3 FB Neck ROM: Full    Dental  (+) Chipped, Dental Advisory Given,    Pulmonary neg pulmonary ROS,    breath sounds clear to auscultation       Cardiovascular hypertension, Pt. on home beta blockers  Rhythm:Regular Rate:Normal     Neuro/Psych Anxiety negative neurological ROS     GI/Hepatic Neg liver ROS, hiatal hernia, GERD  ,  Endo/Other  Hypothyroidism   Renal/GU negative Renal ROS  negative genitourinary   Musculoskeletal  (+) Arthritis , Osteoarthritis,    Abdominal   Peds negative pediatric ROS (+)  Hematology negative hematology ROS (+)   Anesthesia Other Findings   Reproductive/Obstetrics negative OB ROS                            EKG: normal sinus rhythm.  Anesthesia Physical Anesthesia Plan  ASA: II  Anesthesia Plan: General   Post-op Pain Management:    Induction: Intravenous  Airway Management Planned: LMA  Additional Equipment:   Intra-op Plan:   Post-operative Plan: Extubation in OR  Informed Consent: I have reviewed the patients History and Physical, chart, labs and discussed the procedure including the risks, benefits and alternatives for the proposed anesthesia with the patient or authorized representative who has indicated his/her understanding and acceptance.   Dental advisory given  Plan Discussed with: CRNA  Anesthesia Plan Comments:         Anesthesia Quick Evaluation

## 2016-12-15 NOTE — Anesthesia Procedure Notes (Signed)
Procedure Name: LMA Insertion Date/Time: 12/15/2016 7:39 AM Performed by: Lyndee Leo Pre-anesthesia Checklist: Patient identified, Emergency Drugs available, Suction available and Patient being monitored Patient Re-evaluated:Patient Re-evaluated prior to inductionOxygen Delivery Method: Circle system utilized Preoxygenation: Pre-oxygenation with 100% oxygen Intubation Type: IV induction Ventilation: Mask ventilation without difficulty LMA: LMA inserted LMA Size: 3.0 Number of attempts: 1 Airway Equipment and Method: Bite block Placement Confirmation: positive ETCO2 Tube secured with: Tape Dental Injury: Teeth and Oropharynx as per pre-operative assessment

## 2016-12-15 NOTE — Interval H&P Note (Signed)
History and Physical Interval Note:  12/15/2016 7:19 AM  Kathryn [name]  has presented today for surgery, with the diagnosis of RIGHT BREAST MASS X2  The various methods of treatment have been discussed with the patient and family. After consideration of risks, benefits and other options for treatment, the patient has consented to  Procedure(s): RADIOACTIVE SEED X 2 GUIDED EXCISIONAL BREAST BIOPSY (Right) as a surgical intervention .  The patient's history has been reviewed, patient examined, no change in status, stable for surgery.  I have reviewed the patient's chart and labs.  Questions were answered to the patient's satisfaction.     Shalisa Mcquade

## 2016-12-15 NOTE — Op Note (Signed)
Preoperative diagnoses: Right breast nipple discharge, mammographic abnormality times two Postoperative diagnosis: Same as above Procedure:rightbreast seed guided excisional biopsy times two Surgeon: Dr. Serita Grammes Anesthesia: Gen. Estimated blood loss: Minimal Complications: None Drains: None Specimens:Right breast medial tissue marked with paint and one seed, right breast retreoareolar tissue with one seed marked with paint Sponge and needle count correct at completion Disposition to recovery stable  Indications: This is a 44 yof who presented with spontaneous right nipple discharge. She underwent mm that showed two separate lesions and one is a papilloma and the other was alh. We discussed removing both lesions with seed guidance as well as central ducts.  Both seeds were placed prior to beginning  Procedure: After informed consent was obtained she was then taken to the operating room. She was given antibiotics. Sequential compression devices were on her legs. She was placed under general anesthesia without complication. Her rightbreast was then prepped and draped in the standard sterile surgical fashion. A surgical timeout was then performed.  I located the radioactive seeds with the neoprobe. I infiltrated marcaine in the periareolar area and then made a periareolar incision to hide the scar. I then used the neoprobe to guide the excision of the first lateral seed and surrounding tissue.This was confirmed by the neoprobe. This was then taken for mammogram which confirmed removal of  one clip and the seed.  I located the other seed in the retroareolar breast and removed this as well. this was all the central ductal tissue. She did not have discharge today. This was confirmed by radiology. This was then sent to pathology. Hemostasis was observed.I closed the breast tissue with a 2-0 Vicryl. The dermis was closed with 3-0 Vicryl and the skin with 5-0 Monocryl.Dermabond and  steristrips were placed on the incision. A breast binder was placed. She was transferred to recovery stable

## 2016-12-15 NOTE — Discharge Instructions (Signed)
[facility] °Office Phone Number [phone] ° °POST OP INSTRUCTIONS ° °Always review your discharge instruction sheet given to you by the facility where your surgery was performed. ° °IF YOU HAVE DISABILITY OR FAMILY LEAVE FORMS, YOU MUST BRING THEM TO THE OFFICE FOR PROCESSING.  DO NOT GIVE THEM TO YOUR DOCTOR. ° °1. A prescription for pain medication may be given to you upon discharge.  Take your pain medication as prescribed, if needed.  If narcotic pain medicine is not needed, then you may take acetaminophen (Tylenol), naprosyn (Alleve) or ibuprofen (Advil) as needed. °2. Take your usually prescribed medications unless otherwise directed °3. If you need a refill on your pain medication, please contact your pharmacy.  They will contact our office to request authorization.  Prescriptions will not be filled after 5pm or on week-ends. °4. You should eat very light the first 24 hours after surgery, such as soup, crackers, pudding, etc.  Resume your normal diet the day after surgery. °5. Most patients will experience some swelling and bruising in the breast.  Ice packs and a good support bra will help.  Wear the breast binder provided or a sports bra for 72 hours day and night.  After that wear a sports bra during the day until you return to the office. Swelling and bruising can take several days to resolve.  °6. It is common to experience some constipation if taking pain medication after surgery.  Increasing fluid intake and taking a stool softener will usually help or prevent this problem from occurring.  A mild laxative (Milk of Magnesia or Miralax) should be taken according to package directions if there are no bowel movements after 48 hours. °7. Unless discharge instructions indicate otherwise, you may remove your bandages 48 hours after surgery and you may shower at that time.  You may have steri-strips (small skin tapes) in place directly over the incision.  These strips should be left on the  skin for 7-10 days and will come off on their own.  If your surgeon used skin glue on the incision, you may shower in 24 hours.  The glue will flake off over the next 2-3 weeks.  Any sutures or staples will be removed at the office during your follow-up visit. °8. ACTIVITIES:  You may resume regular daily activities (gradually increasing) beginning the next day.  Wearing a good support bra or sports bra minimizes pain and swelling.  You may have sexual intercourse when it is comfortable. °a. You may drive when you no longer are taking prescription pain medication, you can comfortably wear a seatbelt, and you can safely maneuver your car and apply brakes. °b. RETURN TO WORK:  ______________________________________________________________________________________ °9. You should see your doctor in the office for a follow-up appointment approximately two weeks after your surgery.  Your doctor’s nurse will typically make your follow-up appointment when she calls you with your pathology report.  Expect your pathology report 3-4 business days after your surgery.  You may call to check if you do not hear from us after three days. °10. OTHER INSTRUCTIONS: _______________________________________________________________________________________________ _____________________________________________________________________________________________________________________________________ °_____________________________________________________________________________________________________________________________________ °_____________________________________________________________________________________________________________________________________ ° °WHEN TO CALL DR WAKEFIELD: °1. Fever over 101.0 °2. Nausea and/or vomiting. °3. Extreme swelling or bruising. °4. Continued bleeding from incision. °5. Increased pain, redness, or drainage from the incision. ° °The clinic staff is available to answer your questions during regular  business hours.  Please don’t hesitate to call and ask to speak to one of the nurses for clinical concerns.  If   you have a medical emergency, go to the nearest emergency room or call 911.  A surgeon from [facility] is always on call at the hospital. ° °For further questions, please visit [email or web address] ° ° ° °Post Anesthesia Home Care Instructions ° °Activity: °Get plenty of rest for the remainder of the day. A responsible adult should stay with you for 24 hours following the procedure.  °For the next 24 hours, DO NOT: °-Drive a car °-Operate machinery °-Drink alcoholic beverages °-Take any medication unless instructed by your physician °-Make any legal decisions or sign important papers. ° °Meals: °Start with liquid foods such as gelatin or soup. Progress to regular foods as tolerated. Avoid greasy, spicy, heavy foods. If nausea and/or vomiting occur, drink only clear liquids until the nausea and/or vomiting subsides. Call your physician if vomiting continues. ° °Special Instructions/Symptoms: °Your throat may feel dry or sore from the anesthesia or the breathing tube placed in your throat during surgery. If this causes discomfort, gargle with warm salt water. The discomfort should disappear within 24 hours. ° °If you had a scopolamine patch placed behind your ear for the management of post- operative nausea and/or vomiting: ° °1. The medication in the patch is effective for 72 hours, after which it should be removed.  Wrap patch in a tissue and discard in the trash. Wash hands thoroughly with soap and water. °2. You may remove the patch earlier than 72 hours if you experience unpleasant side effects which may include dry mouth, dizziness or visual disturbances. °3. Avoid touching the patch. Wash your hands with soap and water after contact with the patch. °  ° °

## 2016-12-15 NOTE — Transfer of Care (Signed)
Immediate Anesthesia Transfer of Care Note  Patient: Kathryn [name]  Procedure(s) Performed: Procedure(s): RADIOACTIVE SEED X 2 GUIDED EXCISIONAL BREAST BIOPSY (Right)  Patient Location: PACU  Anesthesia Type:General  Level of Consciousness: awake, oriented, sedated and patient cooperative  Airway & Oxygen Therapy: Patient Spontanous Breathing and Patient connected to face mask oxygen  Post-op Assessment: Report given to RN and Post -op Vital signs reviewed and stable  Post vital signs: Reviewed and stable  Last Vitals:  Vitals:   12/15/16 0637  BP: (!) 148/86  Pulse: 73  Resp: 18  Temp: 36.7 C    Last Pain:  Vitals:   12/15/16 0637  TempSrc: Oral         Complications: No apparent anesthesia complications

## 2016-12-15 NOTE — Anesthesia Postprocedure Evaluation (Addendum)
Anesthesia Post Note  Patient: Kathryn [name]  Procedure(s) Performed: Procedure(s) (LRB): RADIOACTIVE SEED X 2 GUIDED EXCISIONAL BREAST BIOPSY (Right)  Patient location during evaluation: PACU Anesthesia Type: General Level of consciousness: awake and alert Pain management: pain level controlled Vital Signs Assessment: post-procedure vital signs reviewed and stable Respiratory status: spontaneous breathing, nonlabored ventilation, respiratory function stable and patient connected to nasal cannula oxygen Cardiovascular status: blood pressure returned to baseline and stable Postop Assessment: no signs of nausea or vomiting Anesthetic complications: no       Last Vitals:  Vitals:   12/15/16 0915 12/15/16 0941  BP:  (!) 165/97  Pulse: 75 83  Resp: 17 18  Temp:  37.1 C    Last Pain:  Vitals:   12/15/16 0941  TempSrc:   PainSc: 0-No pain                 [name]

## 2016-12-15 NOTE — H&P (Signed)
61 yof referred by Dr [name] for new right breast masses. she had spontaneous right nipple discharge that is mostly clear and was once possibly bloody. she has not had a mm in some time. no family history or no personal history. she underwent mm that shows b density breasts. compression views retroareolar show a discrete oval nodule with mild duct ectasia present. the left is negative. US showed a dilated duct with intraductal mass at 12 oclock measuring 4x5x3 mm. in 7 oclock retroareolar area there is 7x4x7 mm mass. [place] right axilla negative. US guided biopsy of both of these is done showing papilloma in both and alh in the 7 oclock lesion. she is here with her husband to discuss options.   Past Surgical History Illene Regulus, CMA; 11/25/2016 3:28 PM) Breast Biopsy  Right. Hysterectomy (not due to cancer) - Complete  Knee Surgery  Bilateral. Oral Surgery  Shoulder Surgery  Left. Spinal Surgery - Lower Back  Spinal Surgery - Neck   Diagnostic Studies History Illene Regulus, CMA; 11/25/2016 3:28 PM) Colonoscopy  5-10 years ago Mammogram  within last year Pap Smear  >5 years ago  Allergies Illene Regulus, CMA; 11/25/2016 3:29 PM) No Known Drug Allergies 11/25/2016  Medication History (Alisha Spillers, CMA; 11/25/2016 3:32 PM) Tylenol (325MG  Tablet, Oral) Active. ALPRAZolam (0.25MG  Tablet, Oral) Active. BC Headache Powder (650-32-195MG  Packet, Oral) Active. Cranberry Active. DULoxetine HCl (60MG  Capsule DR Part, Oral) Active. Levothyroxine Sodium (175MCG Tablet, Oral) Active. Metoprolol Succinate ER (50MG  Tablet ER 24HR, Oral) Active. Topiramate (25MG  Tablet, Oral) Active. Vitamin B12 (100MCG Tablet, Oral) Active. Medications Reconciled  Social History Illene Regulus, CMA; 11/25/2016 3:28 PM) Caffeine use  Carbonated beverages, Tea. No alcohol use  No drug use  Tobacco use  Never smoker.  Family History Illene Regulus, CMA; 11/25/2016 3:28  PM) Arthritis  Brother, Father, Mother. Cerebrovascular Accident  Brother, Mother. Colon Polyps  Mother. Heart Disease  Father, Mother. Hypertension  Brother, Father, Mother.  Pregnancy / Birth History Illene Regulus, CMA; 11/25/2016 3:28 PM) Age at menarche  25 years. Age of menopause  64-50 Gravida  2 Maternal age  50-20 Para  2  Other Problems Illene Regulus, CMA; 11/25/2016 3:28 PM) Anxiety Disorder  Arthritis  Atrial Fibrillation  Back Pain  Depression  Gastroesophageal Reflux Disease  High blood pressure  Hypercholesterolemia  Migraine Headache  Oophorectomy  Bilateral. Thyroid Disease     Review of Systems (Alisha Spillers CMA; 11/25/2016 3:28 PM) General Present- Appetite Loss and Fatigue. Not Present- Chills, Fever, Night Sweats, Weight Gain and Weight Loss. Skin Not Present- Change in Wart/Mole, Dryness, Hives, Jaundice, New Lesions, Non-Healing Wounds, Rash and Ulcer. HEENT Present- Nose Bleed, Ringing in the Ears and Wears glasses/contact lenses. Not Present- Earache, Hearing Loss, Hoarseness, Oral Ulcers, Seasonal Allergies, Sinus Pain, Sore Throat, Visual Disturbances and Yellow Eyes. Respiratory Not Present- Bloody sputum, Chronic Cough, Difficulty Breathing, Snoring and Wheezing. Breast Present- Breast Pain and Nipple Discharge. Not Present- Breast Mass and Skin Changes. Cardiovascular Present- Palpitations and Rapid Heart Rate. Not Present- Chest Pain, Difficulty Breathing Lying Down, Leg Cramps, Shortness of Breath and Swelling of Extremities. Gastrointestinal Not Present- Abdominal Pain, Bloating, Bloody Stool, Change in Bowel Habits, Chronic diarrhea, Constipation, Difficulty Swallowing, Excessive gas, Gets full quickly at meals, Hemorrhoids, Indigestion, Nausea, Rectal Pain and Vomiting. Female Genitourinary Present- Urgency. Not Present- Frequency, Nocturia, Painful Urination and Pelvic Pain. Musculoskeletal Present- Back Pain, Joint Pain  and Joint Stiffness. Not Present- Muscle Pain, Muscle Weakness and Swelling of Extremities. Neurological Present-  Headaches. Not Present- Decreased Memory, Fainting, Numbness, Seizures, Tingling, Tremor, Trouble walking and Weakness. Psychiatric Present- Anxiety and Depression. Not Present- Bipolar, Change in Sleep Pattern, Fearful and Frequent crying. Endocrine Not Present- Cold Intolerance, Excessive Hunger, Hair Changes, Heat Intolerance, Hot flashes and New Diabetes. Hematology Not Present- Blood Thinners, Easy Bruising, Excessive bleeding, Gland problems, HIV and Persistent Infections.  Vitals (Alisha Spillers CMA; 11/25/2016 3:29 PM) 11/25/2016 3:29 PM Weight: 223 lb Height: 66in Body Surface Area: 2.09 m Body Mass Index: 35.99 kg/m  Pulse: 74 (Regular)  BP: 142/80 (Sitting, Left Arm, Standard)       Physical Exam Rolm Bookbinder MD; 11/25/2016 8:03 PM) General Mental Status-Alert. Orientation-Oriented X3.  Chest and Lung Exam Chest and lung exam reveals -on auscultation, normal breath sounds, no adventitious sounds and normal vocal resonance.  Breast Nipples-No Discharge. Breast Lump-No Palpable Breast Mass.  Cardiovascular Cardiovascular examination reveals -normal heart sounds, regular rate and rhythm with no murmurs.  Lymphatic Head & Neck  General Head & Neck Lymphatics: Bilateral - Description - Normal. Axillary  General Axillary Region: Bilateral - Description - Normal. Note: no Farrell adenopathy   Assessment & Plan Rolm Bookbinder MD; 11/25/2016 8:04 PM) BREAST MASS, RIGHT (N63.10) Story: right breast seed x2 excisional biopsy she has pathologic dc (although cannot elicit today) with two separate lesions I think seed guided excision of both is best plan. we discussed upgrade of either lesion on final path. discussed seed placement, risks, recovery. will proceed asap

## 2016-12-16 ENCOUNTER — Encounter (HOSPITAL_BASED_OUTPATIENT_CLINIC_OR_DEPARTMENT_OTHER): Payer: Self-pay | Admitting: General Surgery

## 2017-01-05 DIAGNOSIS — E538 Deficiency of other specified B group vitamins: Secondary | ICD-10-CM | POA: Diagnosis not present

## 2017-01-05 DIAGNOSIS — D509 Iron deficiency anemia, unspecified: Secondary | ICD-10-CM | POA: Diagnosis not present

## 2017-01-05 DIAGNOSIS — I10 Essential (primary) hypertension: Secondary | ICD-10-CM | POA: Diagnosis not present

## 2017-01-05 DIAGNOSIS — E784 Other hyperlipidemia: Secondary | ICD-10-CM | POA: Diagnosis not present

## 2017-01-05 DIAGNOSIS — E038 Other specified hypothyroidism: Secondary | ICD-10-CM | POA: Diagnosis not present

## 2017-01-05 DIAGNOSIS — N6452 Nipple discharge: Secondary | ICD-10-CM | POA: Diagnosis not present

## 2017-01-05 DIAGNOSIS — Z0000 Encounter for general adult medical examination without abnormal findings: Secondary | ICD-10-CM | POA: Diagnosis not present

## 2017-01-08 ENCOUNTER — Telehealth: Payer: Self-pay

## 2017-01-08 NOTE — Telephone Encounter (Signed)
SENT TO SCHEDULING 

## 2017-01-20 ENCOUNTER — Telehealth: Payer: Self-pay | Admitting: Oncology

## 2017-01-20 ENCOUNTER — Encounter: Payer: Self-pay | Admitting: Oncology

## 2017-01-20 NOTE — Telephone Encounter (Signed)
Pt returned call to schedule an appt. Appt has been scheduled for the pt to see Dr. Jana Hakim on 3/20 at 4pm. Pt agreed to the appt date and time. Demographics verified. Letter mailed to the pt.

## 2017-02-03 ENCOUNTER — Other Ambulatory Visit: Payer: BLUE CROSS/BLUE SHIELD

## 2017-02-08 ENCOUNTER — Other Ambulatory Visit: Payer: Self-pay | Admitting: Adult Health

## 2017-02-08 DIAGNOSIS — N6099 Unspecified benign mammary dysplasia of unspecified breast: Secondary | ICD-10-CM

## 2017-02-09 ENCOUNTER — Other Ambulatory Visit (HOSPITAL_BASED_OUTPATIENT_CLINIC_OR_DEPARTMENT_OTHER): Payer: BLUE CROSS/BLUE SHIELD

## 2017-02-09 ENCOUNTER — Ambulatory Visit (HOSPITAL_BASED_OUTPATIENT_CLINIC_OR_DEPARTMENT_OTHER): Payer: BLUE CROSS/BLUE SHIELD | Admitting: Oncology

## 2017-02-09 DIAGNOSIS — Z90710 Acquired absence of both cervix and uterus: Secondary | ICD-10-CM

## 2017-02-09 DIAGNOSIS — Z79810 Long term (current) use of selective estrogen receptor modulators (SERMs): Secondary | ICD-10-CM

## 2017-02-09 DIAGNOSIS — N6099 Unspecified benign mammary dysplasia of unspecified breast: Secondary | ICD-10-CM | POA: Diagnosis not present

## 2017-02-09 DIAGNOSIS — D241 Benign neoplasm of right breast: Secondary | ICD-10-CM

## 2017-02-09 DIAGNOSIS — N6091 Unspecified benign mammary dysplasia of right breast: Secondary | ICD-10-CM

## 2017-02-09 LAB — COMPREHENSIVE METABOLIC PANEL
ALT: 17 U/L (ref 0–55)
AST: 15 U/L (ref 5–34)
Albumin: 3.8 g/dL (ref 3.5–5.0)
Alkaline Phosphatase: 85 U/L (ref 40–150)
Anion Gap: 11 mEq/L (ref 3–11)
BUN: 12.3 mg/dL (ref 7.0–26.0)
CO2: 26 mEq/L (ref 22–29)
Calcium: 9.6 mg/dL (ref 8.4–10.4)
Chloride: 105 mEq/L (ref 98–109)
Creatinine: 0.9 mg/dL (ref 0.6–1.1)
EGFR: 73 mL/min/{1.73_m2} — ABNORMAL LOW (ref >90)
Glucose: 103 mg/dl (ref 70–140)
Potassium: 3.9 mEq/L (ref 3.5–5.1)
Sodium: 142 mEq/L (ref 136–145)
Total Bilirubin: <0.22 mg/dL (ref 0.20–1.20)
Total Protein: 7 g/dL (ref 6.4–8.3)

## 2017-02-09 LAB — CBC WITH DIFFERENTIAL/PLATELET
BASO%: 0.7 % (ref 0.0–2.0)
Basophils Absolute: 0 10*3/uL (ref 0.0–0.1)
EOS%: 1.9 % (ref 0.0–7.0)
Eosinophils Absolute: 0.1 10*3/uL (ref 0.0–0.5)
HCT: 39 % (ref 34.8–46.6)
HGB: 13.3 g/dL (ref 11.6–15.9)
LYMPH%: 20.7 % (ref 14.0–49.7)
MCH: 30 pg (ref 25.1–34.0)
MCHC: 34 g/dL (ref 31.5–36.0)
MCV: 88 fL (ref 79.5–101.0)
MONO#: 0.3 10*3/uL (ref 0.1–0.9)
MONO%: 5 % (ref 0.0–14.0)
NEUT#: 4.6 10*3/uL (ref 1.5–6.5)
NEUT%: 71.7 % (ref 38.4–76.8)
Platelets: 267 10*3/uL (ref 145–400)
RBC: 4.43 10*6/uL (ref 3.70–5.45)
RDW: 14.1 % (ref 11.2–14.5)
WBC: 6.5 10*3/uL (ref 3.9–10.3)
lymph#: 1.3 10*3/uL (ref 0.9–3.3)

## 2017-02-09 MED ORDER — TAMOXIFEN CITRATE 20 MG PO TABS: 20.0000 mg | ORAL_TABLET | Freq: Every day | ORAL | 12 refills | Status: DC

## 2017-02-09 NOTE — Progress Notes (Signed)
[facility]  Telephone:(336) [phone] Fax:(336) [phone]     ID: Kathryn [name] DOB: Sep 19, 1956  MR#: [id number]  SNK#:[id number]  Patient Care Team: [name], MD as PCP - General (Endocrinology) Chauncey Cruel, MD as Consulting Physician (Oncology) Rolm Bookbinder, MD as Consulting Physician (General Surgery) Kristeen Miss, MD as Consulting Physician (Neurosurgery) Laurence Spates, MD as Consulting Physician (Gastroenterology) Chauncey Cruel, MD OTHER MD:  CHIEF COMPLAINT: atypical lobular hyperplasia  CURRENT TREATMENT: Prophylactic tamoxifen   HISTORY OF PRESENT ILLNESS:: Kathryn [name] noted a discharge from her right nipple approximately December 2016. Sometime in November 2017 it became more bloody in appearance. Note that the patient had not had a mammogram for more than 10 years.  She was referred to the breast Center where on 11/02/2016 she underwent bilateral diagnostic mammography with tomography and right breast ultrasonography. The breast density was category B. In the retroareolar right breast there was a discrete circumscribed oval nodule. There was also mild duct ectasia on the right behind the nipple. There were nosuspicious calcificationsin either breast in the left breast was unremarkable. On physical exam there was a small amount of clear discharge elicited from the right nipple. Ultrasound showed a dilated duct in the right breast 12:00 position as well as an intraductal mass, and a second intraductal mass in a dilated duct at the & oclock position>  Biopsy of these masses was obtained 11/04/2016, and this showed ([id number]) intraductal papilloma at 7:00 and atypical lobular hyperplasia with papilloma at the 12:00 position.  Accordingly on 12/15/2016 the patient proceeded to right lumpectomy 2, the more lateral sample showing ductal papilloma, with no evidence of malignancy, and the right retroareolar sample showing ductal papilloma with focal  atypical lobular hyperplasia  The patient's subsequent history is as detailed below  INTERVAL HISTORY: Kathryn [name] was evaluated in the breast clinic 02/09/2017 accompanied by her husband Kathryn [name].  REVIEW OF SYSTEMS: She has occasional headaches but no real problems with migraines. She denies any glaucoma, cataracts or other eye problems. She had a history of atrial fibrillation remotely, in the setting of a urinary tract infection, but cardiology workup at that time she tells me was negative. She does have a history of reflux problems and hypothyroidism. She has multiple joint pains particularly in her back and knees and of course is status post neck and low back as well as knee surgery. Aside from these issues a detailed review of systems today was noncontributory  PAST MEDICAL HISTORY: Past Medical History:  Diagnosis Date  . Anxiety   . Elevated cholesterol   . GERD (gastroesophageal reflux disease)   . H/O hiatal hernia   . Hypertension   . Hypothyroidism   . PONV (postoperative nausea and vomiting)    felt cut with one of past knee surgeries  . Tremor     PAST SURGICAL HISTORY: Past Surgical History:  Procedure Laterality Date  . ABDOMINAL HYSTERECTOMY  yrs ago  . BREAST LUMPECTOMY WITH RADIOACTIVE SEED LOCALIZATION Right 12/15/2016   Procedure: RADIOACTIVE SEED X 2 GUIDED EXCISIONAL BREAST BIOPSY;  Surgeon: Rolm Bookbinder, MD;  Location: [facility];  Service: General;  Laterality: Right;  . CARPAL TUNNEL RELEASE  yrs ago  . cervical neck fusion  yrs ago   C 3 and C 4   . left knee arthroscopy   last done 2008   x 3  . left shoulder arthroscopy  yrs ago  . lower back surgery  2005  . right total knee replacement  2008  . TOTAL KNEE ARTHROPLASTY  01/14/2012   Procedure: TOTAL KNEE ARTHROPLASTY;  Surgeon: Johnn Hai, MD;  Location: [facility];  Service: Orthopedics;  Laterality: Left;  preop femoral nerve block    FAMILY HISTORY No family history on file.    The patient's father died of the age of 23 and the patient's mother at the age of 47, both with congestive heart failure. The patient had 2 brothers, no sisters. There is no history of breast or ovarian cancer in the family to her knowledge.  GYNECOLOGIC HISTORY:  No LMP recorded. Patient has had a hysterectomy. Menarche age 37, first live birth age 47, she is [place] P2; she underwent hysterectomy with bilateral salpingo-oophorectomy at age 61. She did not use hormone replacement.  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 Their daughter telemetry lives in [place] and is studying to be an or an; their daughter Almyra Free lives in [place] and is currently not employed. The patient has 5 grandchildren. She attends the [employer]:    HEALTH MAINTENANCE: Social History  Substance Use Topics  . Smoking status: Never Smoker  . Smokeless tobacco: Never Used  . Alcohol use No     Colonoscopy: [facility]  PAP:  Bone density: Remote; normal   No Known Allergies  Current Outpatient Prescriptions  Medication Sig Dispense Refill  . acetaminophen (TYLENOL) 325 MG tablet Take 650 mg by mouth every 6 (six) hours as needed for mild pain.     Marland Kitchen ALPRAZolam (XANAX) 0.25 MG tablet Take 0.25 mg by mouth 2 (two) times daily as needed for anxiety.     . Aspirin-Salicylamide-Caffeine (BC HEADACHE POWDER PO) Take 1 Package by mouth daily as needed (pain headache).    . CRANBERRY PO Take 1 tablet by mouth daily.     . DULoxetine (CYMBALTA) 60 MG capsule Take 60 mg by mouth at bedtime.    Marland Kitchen levothyroxine (SYNTHROID, LEVOTHROID) 175 MCG tablet Take 175 mcg by mouth. Every except Sunday    . metoprolol succinate (TOPROL-XL) 50 MG 24 hr tablet Take 50 mg by mouth daily. Take with or immediately following a meal.    . vitamin B-12 (CYANOCOBALAMIN) 1000 MCG tablet Take 1,000 mcg by mouth daily.     No current  facility-administered medications for this visit.     OBJECTIVE: Middle-aged white woman who appears stated age 61:   02/09/17 1537  BP: (!) 155/92  Pulse: 76  Resp: 18  Temp: 98 F (36.7 C)     Body mass index is 36.41 kg/m.    ECOG FS:1 - Symptomatic but completely ambulatory  Ocular: Sclerae unicteric, pupils equal, round and reactive to light Ear-nose-throat: Oropharynx clear and moist Lymphatic: No cervical or supraclavicular adenopathy Lungs no rales or rhonchi, good excursion bilaterally Heart regular rate and rhythm, no murmur appreciated Abd soft, nontender, positive bowel sounds MSK no focal spinal tenderness, no joint edema Neuro: non-focal, well-oriented, appropriate affect Breasts: The right breast is status post recent lumpectomy. The cosmetic result is excellent. There is no suspicious mass. There are no skin or nipple changes of concern. The right axilla is benign. The left breast is unremarkable   LAB RESULTS:  CMP     Component Value Date/Time   NA 142 02/09/2017 1502   K 3.9 02/09/2017 1502   CL 97 01/15/2012 0418   CO2 26 02/09/2017 1502  GLUCOSE 103 02/09/2017 1502   BUN 12.3 02/09/2017 1502   CREATININE 0.9 02/09/2017 1502   CALCIUM 9.6 02/09/2017 1502   PROT 7.0 02/09/2017 1502   ALBUMIN 3.8 02/09/2017 1502   AST 15 02/09/2017 1502   ALT 17 02/09/2017 1502   ALKPHOS 85 02/09/2017 1502   BILITOT <0.22 02/09/2017 1502   GFRNONAA >90 01/15/2012 0418   GFRAA >90 01/15/2012 0418    INo results found for: SPEP, UPEP  Lab Results  Component Value Date   WBC 6.5 02/09/2017   NEUTROABS 4.6 02/09/2017   HGB 13.3 02/09/2017   HCT 39.0 02/09/2017   MCV 88.0 02/09/2017   PLT 267 02/09/2017      Chemistry      Component Value Date/Time   NA 142 02/09/2017 1502   K 3.9 02/09/2017 1502   CL 97 01/15/2012 0418   CO2 26 02/09/2017 1502   BUN 12.3 02/09/2017 1502   CREATININE 0.9 02/09/2017 1502      Component Value Date/Time   CALCIUM  9.6 02/09/2017 1502   ALKPHOS 85 02/09/2017 1502   AST 15 02/09/2017 1502   ALT 17 02/09/2017 1502   BILITOT <0.22 02/09/2017 1502       No results found for: LABCA2  No components found for: LABCA125  No results for input(s): INR in the last 168 hours.  Urinalysis    Component Value Date/Time   COLORURINE YELLOW 01/05/2012 0926   APPEARANCEUR CLEAR 01/05/2012 0926   LABSPEC 1.018 01/05/2012 0926   PHURINE 6.5 01/05/2012 0926   GLUCOSEU NEGATIVE 01/05/2012 0926   HGBUR NEGATIVE 01/05/2012 0926   BILIRUBINUR NEGATIVE 01/05/2012 0926   KETONESUR NEGATIVE 01/05/2012 0926   PROTEINUR NEGATIVE 01/05/2012 0926   UROBILINOGEN 0.2 01/05/2012 0926   NITRITE NEGATIVE 01/05/2012 0926   LEUKOCYTESUR NEGATIVE 01/05/2012 0926     STUDIES: Breast density on 11/02/2016 mammogram was category B  ELIGIBLE FOR AVAILABLE RESEARCH PROTOCOL: no  ASSESSMENT: 61 y.o. [place] woman status post right retroareolar lumpectomy 12/15/2016 for ductal papilloma with focal atypical lobular hyperplasia  (a) a second area of the right breast excised the same day showed only ductal papilloma.  PLAN: We spent the better part of today's hour-long appointment discussing the biology and specifics of the patient's tumor.We discussed the meaning of atypical lobular hyperplasia namely that there is too much growth or tumor many cells growing in the breast milk gland, called a lobule, and that the cells are not entirely normal.   There is no sharp boundary line between atypical lobular hyperplasia and lobular carcinoma in situ. There is also currently quite a bit of controversy whether lobular carcinoma in situ itself represents cancer or only a marker of risk. Atypical lobular hyperplasia certainly is not a cancer and may never become a cancer.  What we do know is that patients who have this are at increased risk of developing breast cancer in either breast. That risk may approach 1-2% per year.  Accordingly if Kathryn [name] lives another 20 years, which would be average, she could have a 20-40% chance of developing breast cancer (and of course that means a 60-80% chance of not developing another breast cancer  2 ways of lowering that risk are not recommended. One is bilateral mastectomies. The other one is yearly MRI. Current standard of care still says that atypical lobular hyperplasia is not an indication for either of these. With regards to MRI in particular, quite aside from cost and false positive considerations, Karole does not  have dense breasts and therefore would expect yearly mammography with tomography should be adequately sensitive in her situation to find any breast cancer that might develop at a very early stage  The options that she can more reasonably consider our anastrozole or tamoxifen. Either of these if taken for 5 years would cut the risk of developing breast cancer in half. Both are very inexpensive  We therefore discussed the possible toxicities side effects and complications of these agents in great detail.  Because Tacoya took birth control pills for many years remotely with no complications, and because she is status post hysterectomy, tamoxifen may be the better option for her. That was her choice. She understands also that if she does take tamoxifen she will have the option of using vaginal estrogens as needed to take care of vaginal atrophy problems  In short the plan is for her to start tamoxifen now. She will see me in approximately 3 months. If she tolerates the tamoxifen well the plan will be to continue that for 5 years and I would see her on a yearly basis until completed.  She knows to call for any problems that may develop before her return visit here.   Zyasia has a good understanding of the overall plan. She agrees with it. She knows the goal of treatment in her case is cure. She will call with any problems that may develop before her next visit  here.  Chauncey Cruel, MD   02/09/2017 4:36 PM Medical Oncology and Hematology [facility] [address] [place], [place] [address] Tel. [phone]    Fax. [phone]

## 2017-04-23 NOTE — Addendum Note (Signed)
Addendum  created 04/23/17 7622 by [name], MD   Sign clinical note

## 2017-06-10 ENCOUNTER — Ambulatory Visit (HOSPITAL_BASED_OUTPATIENT_CLINIC_OR_DEPARTMENT_OTHER): Payer: BLUE CROSS/BLUE SHIELD | Admitting: Oncology

## 2017-06-10 VITALS — BP 155/73 | HR 70 | Temp 98.5°F | Resp 18 | Ht 66.0 in | Wt 228.6 lb

## 2017-06-10 DIAGNOSIS — N6091 Unspecified benign mammary dysplasia of right breast: Secondary | ICD-10-CM

## 2017-06-10 DIAGNOSIS — Z79810 Long term (current) use of selective estrogen receptor modulators (SERMs): Secondary | ICD-10-CM

## 2017-06-10 NOTE — Progress Notes (Signed)
[facility]  Telephone:(336) [phone] Fax:(336) [phone]     ID: Kathryn [name] DOB: January 29, 1956  MR#: [id number]  GGY#:[id number]  Patient Care Team: [name], MD as PCP - General (Endocrinology) Angeldejesus Callaham, Virgie Dad, MD as Consulting Physician (Oncology) Rolm Bookbinder, MD as Consulting Physician (General Surgery) Kristeen Miss, MD as Consulting Physician (Neurosurgery) Laurence Spates, MD as Consulting Physician (Gastroenterology) Chauncey Cruel, MD OTHER MD:  CHIEF COMPLAINT: atypical lobular hyperplasia  CURRENT TREATMENT: Prophylactic tamoxifen   HISTORY OF PRESENT ILLNESS:: From the original intake note:  Kathryn [name] noted a discharge from her right nipple approximately December 2016. Sometime in November 2017 it became more bloody in appearance. Note that the patient had not had a mammogram for more than 10 years.  She was referred to the breast Center where on 11/02/2016 she underwent bilateral diagnostic mammography with tomography and right breast ultrasonography. The breast density was category B. In the retroareolar right breast there was a discrete circumscribed oval nodule. There was also mild duct ectasia on the right behind the nipple. There were nosuspicious calcificationsin either breast in the left breast was unremarkable. On physical exam there was a small amount of clear discharge elicited from the right nipple. Ultrasound showed a dilated duct in the right breast 12:00 position as well as an intraductal mass, and a second intraductal mass in a dilated duct at the & oclock position>  Biopsy of these masses was obtained 11/04/2016, and this showed ([id number]) intraductal papilloma at 7:00 and atypical lobular hyperplasia with papilloma at the 12:00 position.  Accordingly on 12/15/2016 the patient proceeded to right lumpectomy 2, the more lateral sample showing ductal papilloma, with no evidence of malignancy, and the right retroareolar sample  showing ductal papilloma with focal atypical lobular hyperplasia  The patient's subsequent history is as detailed below  INTERVAL HISTORY: Kathryn [name] returns today to the high-risk clinic for follow-up, accompanied by her husband Mortimer Fries. At the last visit we discussed anti-estrogens and she decided to start tamoxifen prophylactically. The point of today's visit is is to assess tolerance.  She is doing very well with tamoxifen. She does have hot flashes, which come unpredictably. They don't like her up at night however. "I can deal with it". There has been no vaginal wetness. Currently she is obtaining the drug at no cost.   REVIEW OF SYSTEMS: She does her housework but does not otherwise exercise. They do a lot of traveling to the beach to visit family and grandchildren. She likes walking at the beach. She noticed a little discomfort in the medial aspect of her left breast and she wanted me to check that today. A detailed review of systems today was otherwise stable.  PAST MEDICAL HISTORY: Past Medical History:  Diagnosis Date  . Anxiety   . Elevated cholesterol   . GERD (gastroesophageal reflux disease)   . H/O hiatal hernia   . Hypertension   . Hypothyroidism   . PONV (postoperative nausea and vomiting)    felt cut with one of past knee surgeries  . Tremor     PAST SURGICAL HISTORY: Past Surgical History:  Procedure Laterality Date  . ABDOMINAL HYSTERECTOMY  yrs ago  . BREAST LUMPECTOMY WITH RADIOACTIVE SEED LOCALIZATION Right 12/15/2016   Procedure: RADIOACTIVE SEED X 2 GUIDED EXCISIONAL BREAST BIOPSY;  Surgeon: Rolm Bookbinder, MD;  Location: [facility];  Service: General;  Laterality: Right;  . CARPAL TUNNEL RELEASE  yrs ago  . cervical neck fusion  yrs ago  C 3 and C 4   . left knee arthroscopy   last done 2008   x 3  . left shoulder arthroscopy  yrs ago  . lower back surgery  2005  . right total knee replacement   2008  . TOTAL KNEE ARTHROPLASTY  01/14/2012     Procedure: TOTAL KNEE ARTHROPLASTY;  Surgeon: Johnn Hai, MD;  Location: [facility];  Service: Orthopedics;  Laterality: Left;  preop femoral nerve block    FAMILY HISTORY No family history on file.  The patient's father died of the age of 64 and the patient's mother at the age of 71, both with congestive heart failure. The patient had 2 brothers, no sisters. There is no history of breast or ovarian cancer in the family to her knowledge.  GYNECOLOGIC HISTORY:  No LMP recorded. Patient has had a hysterectomy. Menarche age 26, first live birth age 58, she is [place] P2; she underwent hysterectomy with bilateral salpingo-oophorectomy at age 57. She did not use hormone replacement.  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 Their daughter telemetry lives in [place] and is studying to be an or an; their daughter Almyra Free lives in [place] and is currently not employed. The patient has 5 grandchildren. She attends the [employer]:    HEALTH MAINTENANCE: Social History  Substance Use Topics  . Smoking status: Never Smoker  . Smokeless tobacco: Never Used  . Alcohol use No     Colonoscopy: [facility]  PAP:  Bone density: Remote; normal   No Known Allergies  Current Outpatient Prescriptions  Medication Sig Dispense Refill  . acetaminophen (TYLENOL) 325 MG tablet Take 650 mg by mouth every 6 (six) hours as needed for mild pain.     Marland Kitchen ALPRAZolam (XANAX) 0.25 MG tablet Take 0.25 mg by mouth 2 (two) times daily as needed for anxiety.     . Aspirin-Salicylamide-Caffeine (BC HEADACHE POWDER PO) Take 1 Package by mouth daily as needed (pain headache).    . CRANBERRY PO Take 1 tablet by mouth daily.     . DULoxetine (CYMBALTA) 60 MG capsule Take 60 mg by mouth at bedtime.    Marland Kitchen levothyroxine (SYNTHROID, LEVOTHROID) 175 MCG tablet Take 175 mcg by mouth. Every except Sunday    .  metoprolol succinate (TOPROL-XL) 50 MG 24 hr tablet Take 50 mg by mouth daily. Take with or immediately following a meal.    . vitamin B-12 (CYANOCOBALAMIN) 1000 MCG tablet Take 1,000 mcg by mouth daily.     No current facility-administered medications for this visit.     OBJECTIVE: Middle-aged white woman Who appears well  Vitals:   06/10/17 1352  BP: (!) 155/73  Pulse: 70  Resp: 18  Temp: 98.5 F (36.9 C)     Body mass index is 36.9 kg/m.    ECOG FS:0 - Asymptomatic  Sclerae unicteric, pupils round and equal Oropharynx clear and moist No cervical or supraclavicular adenopathy Lungs no rales or rhonchi Heart regular rate and rhythm Abd soft, nontender, positive bowel sounds MSK no focal spinal tenderness, no upper extremity lymphedema Neuro: nonfocal, well oriented, appropriate affect Breasts: The right breast has undergone lumpectomy. The cosmetic result is excellent and the incision is practically invisible. There is some fullness as expected in the surgical area, but no erythema dehiscence or distortion of the breast contour. There are no suspicious masses.  The right axilla is benign. In the left breast area where she had a little bit of discomfort recently I do not palpate any abnormality. The left axilla is also benign.   LAB RESULTS:  CMP     Component Value Date/Time   NA 142 02/09/2017 1502   K 3.9 02/09/2017 1502   CL 97 01/15/2012 0418   CO2 26 02/09/2017 1502   GLUCOSE 103 02/09/2017 1502   BUN 12.3 02/09/2017 1502   CREATININE 0.9 02/09/2017 1502   CALCIUM 9.6 02/09/2017 1502   PROT 7.0 02/09/2017 1502   ALBUMIN 3.8 02/09/2017 1502   AST 15 02/09/2017 1502   ALT 17 02/09/2017 1502   ALKPHOS 85 02/09/2017 1502   BILITOT <0.22 02/09/2017 1502   GFRNONAA >90 01/15/2012 0418   GFRAA >90 01/15/2012 0418    INo results found for: SPEP, UPEP  Lab Results  Component Value Date   WBC 6.5 02/09/2017   NEUTROABS 4.6 02/09/2017   HGB 13.3 02/09/2017   HCT  39.0 02/09/2017   MCV 88.0 02/09/2017   PLT 267 02/09/2017      Chemistry      Component Value Date/Time   NA 142 02/09/2017 1502   K 3.9 02/09/2017 1502   CL 97 01/15/2012 0418   CO2 26 02/09/2017 1502   BUN 12.3 02/09/2017 1502   CREATININE 0.9 02/09/2017 1502      Component Value Date/Time   CALCIUM 9.6 02/09/2017 1502   ALKPHOS 85 02/09/2017 1502   AST 15 02/09/2017 1502   ALT 17 02/09/2017 1502   BILITOT <0.22 02/09/2017 1502       No results found for: LABCA2  No components found for: LABCA125  No results for input(s): INR in the last 168 hours.  Urinalysis    Component Value Date/Time   COLORURINE YELLOW 01/05/2012 0926   APPEARANCEUR CLEAR 01/05/2012 0926   LABSPEC 1.018 01/05/2012 0926   PHURINE 6.5 01/05/2012 0926   GLUCOSEU NEGATIVE 01/05/2012 0926   HGBUR NEGATIVE 01/05/2012 0926   BILIRUBINUR NEGATIVE 01/05/2012 0926   KETONESUR NEGATIVE 01/05/2012 0926   PROTEINUR NEGATIVE 01/05/2012 0926   UROBILINOGEN 0.2 01/05/2012 0926   NITRITE NEGATIVE 01/05/2012 0926   LEUKOCYTESUR NEGATIVE 01/05/2012 0926     STUDIES: Breast density on 11/02/2016 mammogram was category B  ELIGIBLE FOR AVAILABLE RESEARCH PROTOCOL: no  ASSESSMENT: 61 y.o. [place] woman status post right retroareolar lumpectomy 12/15/2016 for ductal papilloma with focal atypical lobular hyperplasia  (a) a second area of the right breast excised the same day showed only ductal papilloma.  (1) started prophylactic tamoxifen March 2018  (2) followed with yearly mammography with tomography, next due December 2018  PLAN: Kathryn [name] Is tolerating tamoxifen remarkably well and the plan will be to continue that for a total of 5 years.  Last year she appropriately had a diagnostic mammogram but she tells me that ended up costing her $800. She would get the screening one for free. She would get tomography either way. Accordingly I am setting her up for screening mammography in December or  January, with tomography.   She sees Dr. Forde Dandy in January and July. I'm going to see her in April on a yearly basis until she completes her 5 years of follow-up   Chauncey Cruel, MD   06/10/2017 2:12 PM Medical Oncology and Hematology [facility] [place], [place] [address] Tel. [phone]    Fax. [phone]

## 2017-06-11 DIAGNOSIS — Z1389 Encounter for screening for other disorder: Secondary | ICD-10-CM | POA: Diagnosis not present

## 2017-06-11 DIAGNOSIS — E038 Other specified hypothyroidism: Secondary | ICD-10-CM | POA: Diagnosis not present

## 2017-06-11 DIAGNOSIS — I10 Essential (primary) hypertension: Secondary | ICD-10-CM | POA: Diagnosis not present

## 2017-06-11 DIAGNOSIS — I480 Paroxysmal atrial fibrillation: Secondary | ICD-10-CM | POA: Diagnosis not present

## 2017-06-11 DIAGNOSIS — E784 Other hyperlipidemia: Secondary | ICD-10-CM | POA: Diagnosis not present

## 2017-10-08 DIAGNOSIS — M545 Low back pain: Secondary | ICD-10-CM | POA: Diagnosis not present

## 2017-10-08 DIAGNOSIS — M5416 Radiculopathy, lumbar region: Secondary | ICD-10-CM | POA: Diagnosis not present

## 2017-10-08 DIAGNOSIS — M5136 Other intervertebral disc degeneration, lumbar region: Secondary | ICD-10-CM | POA: Diagnosis not present

## 2017-11-01 ENCOUNTER — Ambulatory Visit: Payer: BLUE CROSS/BLUE SHIELD

## 2017-11-05 ENCOUNTER — Ambulatory Visit: Payer: BLUE CROSS/BLUE SHIELD

## 2017-11-08 DIAGNOSIS — M5136 Other intervertebral disc degeneration, lumbar region: Secondary | ICD-10-CM | POA: Diagnosis not present

## 2017-11-09 DIAGNOSIS — M5431 Sciatica, right side: Secondary | ICD-10-CM | POA: Diagnosis not present

## 2017-11-09 DIAGNOSIS — M4316 Spondylolisthesis, lumbar region: Secondary | ICD-10-CM | POA: Diagnosis not present

## 2017-11-09 DIAGNOSIS — M5137 Other intervertebral disc degeneration, lumbosacral region: Secondary | ICD-10-CM | POA: Diagnosis not present

## 2017-11-10 DIAGNOSIS — N6091 Unspecified benign mammary dysplasia of right breast: Secondary | ICD-10-CM | POA: Diagnosis not present

## 2017-11-10 DIAGNOSIS — E538 Deficiency of other specified B group vitamins: Secondary | ICD-10-CM | POA: Diagnosis not present

## 2017-11-10 DIAGNOSIS — I10 Essential (primary) hypertension: Secondary | ICD-10-CM | POA: Diagnosis not present

## 2017-11-10 DIAGNOSIS — E7849 Other hyperlipidemia: Secondary | ICD-10-CM | POA: Diagnosis not present

## 2017-11-10 DIAGNOSIS — I480 Paroxysmal atrial fibrillation: Secondary | ICD-10-CM | POA: Diagnosis not present

## 2017-11-10 DIAGNOSIS — E038 Other specified hypothyroidism: Secondary | ICD-10-CM | POA: Diagnosis not present

## 2017-11-30 ENCOUNTER — Ambulatory Visit
Admission: RE | Admit: 2017-11-30 | Discharge: 2017-11-30 | Disposition: A | Discharge status: Home / Self Care | Payer: BLUE CROSS/BLUE SHIELD | Source: Ambulatory Visit | Attending: Adult Health | Admitting: Adult Health

## 2017-11-30 DIAGNOSIS — Z1231 Encounter for screening mammogram for malignant neoplasm of breast: Secondary | ICD-10-CM | POA: Diagnosis not present

## 2017-11-30 DIAGNOSIS — N6091 Unspecified benign mammary dysplasia of right breast: Secondary | ICD-10-CM

## 2017-12-07 DIAGNOSIS — M961 Postlaminectomy syndrome, not elsewhere classified: Secondary | ICD-10-CM | POA: Diagnosis not present

## 2017-12-07 DIAGNOSIS — M5136 Other intervertebral disc degeneration, lumbar region: Secondary | ICD-10-CM | POA: Diagnosis not present

## 2017-12-23 DIAGNOSIS — M47816 Spondylosis without myelopathy or radiculopathy, lumbar region: Secondary | ICD-10-CM | POA: Diagnosis not present

## 2018-02-02 DIAGNOSIS — M961 Postlaminectomy syndrome, not elsewhere classified: Secondary | ICD-10-CM | POA: Diagnosis not present

## 2018-02-02 DIAGNOSIS — M47816 Spondylosis without myelopathy or radiculopathy, lumbar region: Secondary | ICD-10-CM | POA: Diagnosis not present

## 2018-02-02 DIAGNOSIS — M4316 Spondylolisthesis, lumbar region: Secondary | ICD-10-CM | POA: Diagnosis not present

## 2018-02-02 DIAGNOSIS — M5136 Other intervertebral disc degeneration, lumbar region: Secondary | ICD-10-CM | POA: Diagnosis not present

## 2018-03-01 ENCOUNTER — Telehealth: Payer: Self-pay | Admitting: Oncology

## 2018-03-01 ENCOUNTER — Ambulatory Visit: Payer: BLUE CROSS/BLUE SHIELD | Admitting: Oncology

## 2018-03-01 NOTE — Telephone Encounter (Signed)
Patient called to cancel she is out of town she will schedule once she is back in town

## 2018-03-15 DIAGNOSIS — M545 Low back pain: Secondary | ICD-10-CM | POA: Diagnosis not present

## 2018-03-15 DIAGNOSIS — S32000A Wedge compression fracture of unspecified lumbar vertebra, initial encounter for closed fracture: Secondary | ICD-10-CM | POA: Diagnosis not present

## 2018-03-15 DIAGNOSIS — M5136 Other intervertebral disc degeneration, lumbar region: Secondary | ICD-10-CM | POA: Diagnosis not present

## 2018-03-22 DIAGNOSIS — M859 Disorder of bone density and structure, unspecified: Secondary | ICD-10-CM | POA: Diagnosis not present

## 2018-03-22 DIAGNOSIS — M545 Low back pain: Secondary | ICD-10-CM | POA: Diagnosis not present

## 2018-03-22 DIAGNOSIS — M5136 Other intervertebral disc degeneration, lumbar region: Secondary | ICD-10-CM | POA: Diagnosis not present

## 2018-03-22 DIAGNOSIS — M4316 Spondylolisthesis, lumbar region: Secondary | ICD-10-CM | POA: Diagnosis not present

## 2018-04-05 DIAGNOSIS — M5416 Radiculopathy, lumbar region: Secondary | ICD-10-CM | POA: Diagnosis not present

## 2018-04-05 DIAGNOSIS — M48061 Spinal stenosis, lumbar region without neurogenic claudication: Secondary | ICD-10-CM | POA: Diagnosis not present

## 2018-04-05 DIAGNOSIS — M519 Unspecified thoracic, thoracolumbar and lumbosacral intervertebral disc disorder: Secondary | ICD-10-CM | POA: Diagnosis not present

## 2018-04-05 DIAGNOSIS — D3502 Benign neoplasm of left adrenal gland: Secondary | ICD-10-CM | POA: Diagnosis not present

## 2018-04-19 DIAGNOSIS — D3500 Benign neoplasm of unspecified adrenal gland: Secondary | ICD-10-CM | POA: Diagnosis not present

## 2018-04-26 DIAGNOSIS — M5136 Other intervertebral disc degeneration, lumbar region: Secondary | ICD-10-CM | POA: Diagnosis not present

## 2018-04-26 DIAGNOSIS — M419 Scoliosis, unspecified: Secondary | ICD-10-CM | POA: Diagnosis not present

## 2018-04-26 DIAGNOSIS — S32000A Wedge compression fracture of unspecified lumbar vertebra, initial encounter for closed fracture: Secondary | ICD-10-CM | POA: Diagnosis not present

## 2018-04-26 DIAGNOSIS — D3502 Benign neoplasm of left adrenal gland: Secondary | ICD-10-CM | POA: Diagnosis not present

## 2018-05-02 ENCOUNTER — Other Ambulatory Visit: Payer: Self-pay | Admitting: Oncology

## 2018-06-02 DIAGNOSIS — D3500 Benign neoplasm of unspecified adrenal gland: Secondary | ICD-10-CM | POA: Diagnosis not present

## 2018-06-07 DIAGNOSIS — M4316 Spondylolisthesis, lumbar region: Secondary | ICD-10-CM | POA: Diagnosis not present

## 2018-06-07 DIAGNOSIS — M419 Scoliosis, unspecified: Secondary | ICD-10-CM | POA: Diagnosis not present

## 2018-06-07 DIAGNOSIS — M5136 Other intervertebral disc degeneration, lumbar region: Secondary | ICD-10-CM | POA: Diagnosis not present

## 2018-06-07 DIAGNOSIS — S32000A Wedge compression fracture of unspecified lumbar vertebra, initial encounter for closed fracture: Secondary | ICD-10-CM | POA: Diagnosis not present

## 2018-06-13 DIAGNOSIS — Z13820 Encounter for screening for osteoporosis: Secondary | ICD-10-CM | POA: Diagnosis not present

## 2018-09-07 DIAGNOSIS — M4726 Other spondylosis with radiculopathy, lumbar region: Secondary | ICD-10-CM | POA: Diagnosis not present

## 2018-09-07 DIAGNOSIS — S32010D Wedge compression fracture of first lumbar vertebra, subsequent encounter for fracture with routine healing: Secondary | ICD-10-CM | POA: Diagnosis not present

## 2018-09-07 DIAGNOSIS — M4316 Spondylolisthesis, lumbar region: Secondary | ICD-10-CM | POA: Diagnosis not present

## 2018-09-07 DIAGNOSIS — M48062 Spinal stenosis, lumbar region with neurogenic claudication: Secondary | ICD-10-CM | POA: Diagnosis not present

## 2018-09-19 ENCOUNTER — Other Ambulatory Visit: Payer: Self-pay | Admitting: Neurological Surgery

## 2018-09-19 DIAGNOSIS — S32010D Wedge compression fracture of first lumbar vertebra, subsequent encounter for fracture with routine healing: Secondary | ICD-10-CM

## 2018-09-29 ENCOUNTER — Ambulatory Visit
Admission: RE | Admit: 2018-09-29 | Discharge: 2018-09-29 | Disposition: A | Discharge status: Home / Self Care | Payer: BLUE CROSS/BLUE SHIELD | Source: Ambulatory Visit | Attending: Neurological Surgery | Admitting: Neurological Surgery

## 2018-09-29 DIAGNOSIS — M5126 Other intervertebral disc displacement, lumbar region: Secondary | ICD-10-CM | POA: Diagnosis not present

## 2018-09-29 DIAGNOSIS — S32010D Wedge compression fracture of first lumbar vertebra, subsequent encounter for fracture with routine healing: Secondary | ICD-10-CM

## 2018-09-29 MED ORDER — GADOBENATE DIMEGLUMINE 529 MG/ML IV SOLN
20.0000 mL | Freq: Once | INTRAVENOUS | Status: AC | PRN
  Administered 2018-09-29: 20 mL via INTRAVENOUS

## 2018-10-25 DIAGNOSIS — S32010G Wedge compression fracture of first lumbar vertebra, subsequent encounter for fracture with delayed healing: Secondary | ICD-10-CM | POA: Diagnosis not present

## 2018-10-25 DIAGNOSIS — S32010A Wedge compression fracture of first lumbar vertebra, initial encounter for closed fracture: Secondary | ICD-10-CM | POA: Diagnosis not present

## 2018-10-25 DIAGNOSIS — X58XXXA Exposure to other specified factors, initial encounter: Secondary | ICD-10-CM | POA: Diagnosis not present

## 2018-10-25 DIAGNOSIS — Y999 Unspecified external cause status: Secondary | ICD-10-CM | POA: Diagnosis not present

## 2018-10-25 DIAGNOSIS — Y929 Unspecified place or not applicable: Secondary | ICD-10-CM | POA: Diagnosis not present

## 2018-10-25 DIAGNOSIS — Y939 Activity, unspecified: Secondary | ICD-10-CM | POA: Diagnosis not present

## 2018-11-09 DIAGNOSIS — S32010G Wedge compression fracture of first lumbar vertebra, subsequent encounter for fracture with delayed healing: Secondary | ICD-10-CM | POA: Diagnosis not present

## 2018-12-14 DIAGNOSIS — S32010D Wedge compression fracture of first lumbar vertebra, subsequent encounter for fracture with routine healing: Secondary | ICD-10-CM | POA: Diagnosis not present

## 2019-05-10 ENCOUNTER — Other Ambulatory Visit (HOSPITAL_COMMUNITY): Payer: Self-pay | Admitting: Neurological Surgery

## 2019-05-10 ENCOUNTER — Other Ambulatory Visit: Payer: Self-pay | Admitting: Neurological Surgery

## 2019-05-10 DIAGNOSIS — M47819 Spondylosis without myelopathy or radiculopathy, site unspecified: Secondary | ICD-10-CM

## 2019-05-15 MED ORDER — SODIUM CHLORIDE 0.9 % IV SOLN: 4.0000 mg | Freq: Four times a day (QID) | INTRAVENOUS | Status: DC | PRN

## 2019-05-31 ENCOUNTER — Ambulatory Visit (HOSPITAL_COMMUNITY)
Admission: RE | Admit: 2019-05-31 | Discharge: 2019-05-31 | Disposition: A | Discharge status: Home / Self Care | Payer: Medicare Other | Source: Ambulatory Visit | Attending: Neurological Surgery | Admitting: Neurological Surgery

## 2019-05-31 ENCOUNTER — Other Ambulatory Visit: Payer: Self-pay

## 2019-05-31 DIAGNOSIS — M48061 Spinal stenosis, lumbar region without neurogenic claudication: Secondary | ICD-10-CM | POA: Diagnosis not present

## 2019-05-31 DIAGNOSIS — M47814 Spondylosis without myelopathy or radiculopathy, thoracic region: Secondary | ICD-10-CM | POA: Insufficient documentation

## 2019-05-31 DIAGNOSIS — M2578 Osteophyte, vertebrae: Secondary | ICD-10-CM | POA: Insufficient documentation

## 2019-05-31 DIAGNOSIS — M47812 Spondylosis without myelopathy or radiculopathy, cervical region: Secondary | ICD-10-CM | POA: Diagnosis not present

## 2019-05-31 DIAGNOSIS — M4712 Other spondylosis with myelopathy, cervical region: Secondary | ICD-10-CM | POA: Diagnosis not present

## 2019-05-31 DIAGNOSIS — M5126 Other intervertebral disc displacement, lumbar region: Secondary | ICD-10-CM | POA: Insufficient documentation

## 2019-05-31 DIAGNOSIS — M47819 Spondylosis without myelopathy or radiculopathy, site unspecified: Secondary | ICD-10-CM

## 2019-05-31 DIAGNOSIS — M4316 Spondylolisthesis, lumbar region: Secondary | ICD-10-CM | POA: Insufficient documentation

## 2019-05-31 DIAGNOSIS — M545 Low back pain: Secondary | ICD-10-CM | POA: Diagnosis not present

## 2019-05-31 MED ORDER — LIDOCAINE HCL (PF) 1 % IJ SOLN
5.0000 mL | Freq: Once | INTRAMUSCULAR | Status: AC
  Administered 2019-05-31: 5 mL via INTRADERMAL

## 2019-05-31 MED ORDER — DIAZEPAM 5 MG PO TABS
5.0000 mg | ORAL_TABLET | Freq: Once | ORAL | Status: AC
  Administered 2019-05-31: 07:00:00 5 mg via ORAL
  Filled 2019-05-31: qty 1

## 2019-05-31 MED ORDER — ONDANSETRON HCL 4 MG/2ML IJ SOLN: 4.0000 mg | Freq: Four times a day (QID) | INTRAMUSCULAR | Status: DC | PRN

## 2019-05-31 MED ORDER — OXYCODONE-ACETAMINOPHEN 5-325 MG PO TABS: 1.0000 | ORAL_TABLET | ORAL | Status: DC | PRN

## 2019-05-31 MED ORDER — DEXAMETHASONE 4 MG PO TABS
4.0000 mg | ORAL_TABLET | Freq: Once | ORAL | Status: AC
  Administered 2019-05-31: 09:00:00 4 mg via ORAL

## 2019-05-31 MED ORDER — OXYCODONE-ACETAMINOPHEN 5-325 MG PO TABS
ORAL_TABLET | ORAL | Status: AC
  Administered 2019-05-31: 09:00:00 2
  Filled 2019-05-31: qty 2

## 2019-05-31 MED ORDER — DEXAMETHASONE 4 MG PO TABS
ORAL_TABLET | ORAL | Status: AC
  Filled 2019-05-31: qty 1

## 2019-05-31 MED ORDER — IOHEXOL 300 MG/ML  SOLN
10.0000 mL | Freq: Once | INTRAMUSCULAR | Status: AC | PRN
  Administered 2019-05-31: 8 mL via INTRATHECAL

## 2019-05-31 NOTE — Discharge Instructions (Signed)
Myelogram and Lumbar Puncture Discharge Instructions  1. Go home and rest quietly for the next 24 hours.  It is important to lie flat for the next 24 hours.  Get up only to go to the restroom.  You may lie in the bed or on a couch on your back, your stomach, your left side or your right side.  You may have one pillow under your head.  You may have pillows between your knees while you are on your side or under your knees while you are on your back.  2. DO NOT drive today.  Recline the seat as far back as it will go, while still wearing your seat belt, on the way home.  3. You may get up to go to the bathroom as needed.  You may sit up for 10 minutes to eat.  You may resume your normal diet and medications unless otherwise indicated.  4. The incidence of headache, nausea, or vomiting is about 5% (one in 20 patients).  If you develop a headache, lie flat and drink plenty of fluids until the headache goes away.  Caffeinated beverages may be helpful.  If you develop severe nausea and vomiting or a headache that does not go away with flat bed rest, call your Dr   5. You may resume normal activities after your 24 hours of bed rest is over; however, do not exert yourself strongly or do any heavy lifting tomorrow.  6. Call your physician for a follow-up appointment.  The results of your myelogram will be sent directly to your physician by the following day.  7. If you have any questions or if complications develop after you arrive home, call your Dr .  Discharge instructions have been explained to the patient.  The patient, or the person responsible for the patient, fully understands these instructions.  Myelogram  A myelogram is an imaging test. This test checks for problems in the spinal cord and the places where nerves attach to the spinal cord (nerve roots). A dye (contrast material) is put into your spine before the X-ray. This provides a clearer image for your doctor to see. You may need this test  if you have a spinal cord problem that cannot be diagnosed with other imaging tests. You may also have this test to check your spine after surgery. Tell a doctor about:  Any allergies you have, especially to iodine.  All medicines you are taking, including vitamins, herbs, eye drops, creams, and over-the-counter medicines.  Any problems you or family members have had with anesthetic medicines or dye.  Any blood disorders you have.  Any surgeries you have had.  Any medical conditions you have or have had, including asthma.  Whether you are pregnant or may be pregnant. What are the risks? Generally, this is a safe procedure. However, problems may occur, including:  Infection.  Bleeding.  Allergic reaction to medicines or dyes.  Damage to your spinal cord or nerves.  Leaking of spinal fluid. This can cause a headache.  Damage to kidneys.  Seizures. This is rare. What happens before the procedure?  Follow instructions from your doctor about what you cannot eat or drink. You may be asked to drink more fluids.  Ask your doctor about changing or stopping your normal medicines. This is important if you take diabetes medicines or blood thinners.  Plan to have someone take you home from the hospital or clinic.  If you will be going home right after the procedure,  plan to have someone with you for 24 hours. What happens during the procedure?  You will lie face down on a table.  Your doctor will find the best injection site on your spine. This is most often in the lower back.  This area will be washed with soap.  You will be given a medicine to numb the area (local anesthetic).  Your doctor will place a long needle into the space around your spinal cord.  A sample of spinal fluid may be taken. This may be sent to the lab for testing.  The dye will be injected into the space around your spinal cord.  The exam table may be tilted. This helps the dye flow up or down your  spine.  The X-ray will take images of your spinal cord.  A bandage (dressing) may be placed over the area where the dye was injected. The procedure may vary among doctors and hospitals. What can I expect after the procedure?  You may be monitored until you leave the hospital or clinic. This includes checking your blood pressure, heart rate, breathing rate, and blood oxygen level.  You may feel sore at the injection site. You may have a mild headache.  You will be told to lie flat with your head raised (elevated). This lowers the risk of a headache.  It is up to you to get the results of your procedure. Ask your doctor, or the department that is doing the procedure, when your results will be ready. Follow these instructions at home:   Rest as told by your doctor. Lie flat with your head slightly elevated.  Do not bend, lift, or do hard work for 24-48 hours, or as told by your doctor.  Take over-the-counter and prescription medicines only as told by your doctor.  Take care of your bandage as told by your doctor.  Drink enough fluid to keep your pee (urine) pale yellow.  Bathe or shower as told by your doctor. Contact a doctor if:  You have a fever.  You have a headache that lasts longer than 24 hours.  You feel sick to your stomach (nauseous).  You vomit.  Your neck is stiff.  Your legs feel numb.  You cannot pee.  You cannot poop (no bowel movement).  You have a rash.  You are itchy or sneezing. Get help right away if:  You have new symptoms or your symptoms get worse.  You have a seizure.  You have trouble breathing. Summary  A myelogram is an imaging test that checks for problems in the spinal cord and the places where nerves attach to the spinal cord (nerve roots).  Before the procedure, follow instructions from your doctor. You will be told what not to eat or drink, or what medicines to change or stop.  After the procedure, you will be told to lie  flat with your head raised (elevated). This will lower your risk of a headache.  Do not bend, lift, or do any hard work for 24-48 hours, or as told by your doctor.  Contact a doctor if you have a stiff neck or numb legs. Get help right away if your symptoms get worse, or you have a seizure or trouble breathing. This information is not intended to replace advice given to you by your health care provider. Make sure you discuss any questions you have with your health care provider. Document Released: 08/18/2008 Document Revised: 01/18/2019 Document Reviewed: 01/19/2019 Elsevier Patient Education  2020 Elsevier  Inc. ° °

## 2019-05-31 NOTE — Progress Notes (Signed)
Dr Ellene Route called back and was informed of the meds (Xanax and Cymbalta) the pt took last night. States that is ok.

## 2019-05-31 NOTE — Progress Notes (Signed)
Discharge instructions reviewed with pt and her husband (via Telephone) voices understanding.

## 2019-05-31 NOTE — Procedures (Signed)
Kathryn [name] is a 63 year old individual who has had significant spondylitic disease in the lumbar spine with a degenerative spondylolisthesis.  She also has had an L1 compression fracture secondary to osteoporosis.  She has had cervical spondylosis but has recently developed generalized weakness and pain throughout her body which is gotten progressively worse.  Because of the various areas of involvement including the cervical and lumbar regions I suggested a total myelogram be performed.  Significant claustrophobia and cannot tolerate MRIs very well.  Procedure Pre op Dx: Cervical thoracic and lumbar spondylosis with myelopathy Post op Dx: Same Procedure: Total myelogram Surgeon: Ellene Route Puncture level: L2-3 Fluid color: Clear colorless Injection: Isovue-300, 7 mL Findings: Severe lumbar stenosis at L4-5 with spondylolisthesis.  Moderate least severe cervical spondylosis and stenosis further evaluation with CT scanning throughout the entire spinal canal

## 2019-05-31 NOTE — Progress Notes (Signed)
Attempted to call Dr Ellene Route no answer. Spoke with Elvera Lennox informed her that the pt took Xaxax and Cymbalta last night.

## 2019-06-09 DIAGNOSIS — M4316 Spondylolisthesis, lumbar region: Secondary | ICD-10-CM | POA: Diagnosis not present

## 2019-06-12 ENCOUNTER — Other Ambulatory Visit: Payer: Self-pay | Admitting: Neurological Surgery

## 2019-06-29 NOTE — Pre-Procedure Instructions (Signed)
Kathryn [name]  06/29/2019      [facility] - [place], [place] - [address] [address] [address] [place] [address] Phone: [phone] Fax: [phone]    Your procedure is scheduled on Aug. 11  Report to [facility] Entrance A at 5:30 A.M.  Call this number if you have problems the morning of surgery:  [phone]   Remember:  Do not eat or drink after midnight.      Take these medicines the morning of surgery with A SIP OF WATER :              Tylenol if needed              Alprazolam (xanax) if needed              Duloxetine (cymbalta)             levothryoxine (synthroid)             Metoprolol (toprol-xl)              Tamoxifen (nolvadex)              Tramadol if needed              7 days prior to surgery STOP taking any Aspirin (unless otherwise instructed by your surgeon), Aleve, Naproxen, Ibuprofen, Motrin, Advil, Goody's, BC's, all herbal medications, fish oil, and all vitamins.    Do not wear jewelry, make-up or nail polish.  Do not wear lotions, powders, or perfumes, or deodorant.  Do not shave 48 hours prior to surgery.  Men may shave face and neck.  Do not bring valuables to the hospital.  [facility] is not responsible for any belongings or valuables.  Contacts, dentures or bridgework may not be worn into surgery.  Leave your suitcase in the car.  After surgery it may be brought to your room.  For patients admitted to the hospital, discharge time will be determined by your treatment team.  Patients discharged the day of surgery will not be allowed to drive home.    Special instructions:   [facility]- Preparing For Surgery  Before surgery, you can play an important role. Because skin is not sterile, your skin needs to be as free of germs as possible. You can reduce the number of germs on your skin by washing with CHG (chlorahexidine gluconate) Soap before surgery.  CHG is an antiseptic cleaner  which kills germs and bonds with the skin to continue killing germs even after washing.    Oral Hygiene is also important to reduce your risk of infection.  Remember - BRUSH YOUR TEETH THE MORNING OF SURGERY WITH YOUR REGULAR TOOTHPASTE  Please do not use if you have an allergy to CHG or antibacterial soaps. If your skin becomes reddened/irritated stop using the CHG.  Do not shave (including legs and underarms) for at least 48 hours prior to first CHG shower. It is OK to shave your face.  Please follow these instructions carefully.   1. Shower the NIGHT BEFORE SURGERY and the MORNING OF SURGERY with CHG.   2. If you chose to wash your hair, wash your hair first as usual with your normal shampoo.  3. After you shampoo, rinse your hair and body thoroughly to remove the shampoo.  4. Use CHG as you would any other liquid soap. You can apply CHG directly to the skin and wash gently with a  scrungie or a clean washcloth.   5. Apply the CHG Soap to your body ONLY FROM THE NECK DOWN.  Do not use on open wounds or open sores. Avoid contact with your eyes, ears, mouth and genitals (private parts). Wash Face and genitals (private parts)  with your normal soap.  6. Wash thoroughly, paying special attention to the area where your surgery will be performed.  7. Thoroughly rinse your body with warm water from the neck down.  8. DO NOT shower/wash with your normal soap after using and rinsing off the CHG Soap.  9. Pat yourself dry with a CLEAN TOWEL.  10. Wear CLEAN PAJAMAS to bed the night before surgery, wear comfortable clothes the morning of surgery  11. Place CLEAN SHEETS on your bed the night of your first shower and DO NOT SLEEP WITH PETS.    Day of Surgery:  Do not apply any deodorants/lotions.  Please wear clean clothes to the hospital/surgery center.   Remember to brush your teeth WITH YOUR REGULAR TOOTHPASTE.    Please read over the following fact sheets that you were  given. Coughing and Deep Breathing, MRSA Information and Surgical Site Infection Prevention

## 2019-06-30 ENCOUNTER — Other Ambulatory Visit: Payer: Self-pay

## 2019-06-30 ENCOUNTER — Other Ambulatory Visit (HOSPITAL_COMMUNITY)
Admission: RE | Admit: 2019-06-30 | Discharge: 2019-06-30 | Disposition: A | Discharge status: Home / Self Care | Payer: Medicare Other | Source: Ambulatory Visit | Attending: Neurological Surgery | Admitting: Neurological Surgery

## 2019-06-30 ENCOUNTER — Encounter (HOSPITAL_COMMUNITY): Payer: Self-pay

## 2019-06-30 ENCOUNTER — Encounter (HOSPITAL_COMMUNITY)
Admission: RE | Admit: 2019-06-30 | Discharge: 2019-06-30 | Disposition: A | Discharge status: Home / Self Care | Payer: Medicare Other | Source: Ambulatory Visit | Attending: Neurological Surgery | Admitting: Neurological Surgery

## 2019-06-30 DIAGNOSIS — Z20828 Contact with and (suspected) exposure to other viral communicable diseases: Secondary | ICD-10-CM | POA: Diagnosis not present

## 2019-06-30 DIAGNOSIS — M4316 Spondylolisthesis, lumbar region: Secondary | ICD-10-CM | POA: Insufficient documentation

## 2019-06-30 DIAGNOSIS — Z01818 Encounter for other preprocedural examination: Secondary | ICD-10-CM | POA: Insufficient documentation

## 2019-06-30 HISTORY — DX: Depression, unspecified: F32.A

## 2019-06-30 HISTORY — DX: Presence of dental prosthetic device (complete) (partial): Z97.2

## 2019-06-30 LAB — CBC
HCT: 40 % (ref 36.0–46.0)
Hemoglobin: 12.9 g/dL (ref 12.0–15.0)
MCH: 30.7 pg (ref 26.0–34.0)
MCHC: 32.3 g/dL (ref 30.0–36.0)
MCV: 95.2 fL (ref 80.0–100.0)
Platelets: 224 10*3/uL (ref 150–400)
RBC: 4.2 MIL/uL (ref 3.87–5.11)
RDW: 14.3 % (ref 11.5–15.5)
WBC: 5.7 10*3/uL (ref 4.0–10.5)
nRBC: 0 % (ref 0.0–0.2)

## 2019-06-30 LAB — SURGICAL PCR SCREEN
MRSA, PCR: NEGATIVE
Staphylococcus aureus: POSITIVE — AB

## 2019-06-30 LAB — BASIC METABOLIC PANEL
Anion gap: 9 (ref 5–15)
BUN: 13 mg/dL (ref 8–23)
CO2: 29 mmol/L (ref 22–32)
Calcium: 9.2 mg/dL (ref 8.9–10.3)
Chloride: 103 mmol/L (ref 98–111)
Creatinine, Ser: 0.96 mg/dL (ref 0.44–1.00)
GFR calc Af Amer: >60 mL/min (ref >60)
GFR calc non Af Amer: >60 mL/min (ref >60)
Glucose, Bld: 99 mg/dL (ref 70–99)
Potassium: 4.3 mmol/L (ref 3.5–5.1)
Sodium: 141 mmol/L (ref 135–145)

## 2019-06-30 LAB — TYPE AND SCREEN
ABO/RH(D): A NEG
Antibody Screen: NEGATIVE

## 2019-06-30 LAB — ABO/RH: ABO/RH(D): A NEG

## 2019-06-30 LAB — SARS CORONAVIRUS 2 (TAT 6-24 HRS): SARS Coronavirus 2: NEGATIVE

## 2019-06-30 NOTE — Progress Notes (Signed)
Patient denies shortness of breath, fever, cough and chest pain at PAT appointment  PCP - Dr [name] Cardiologist - Denies  Chest x-ray - Denies EKG - 06/30/19 Stress Test - 06/2008 ECHO - 06/2010 Cardiac Cath - Denies  Anesthesia review:   7 days prior to surgery STOP taking any Aspirin (unless otherwise instructed by your surgeon), Aleve, Naproxen, Ibuprofen, Motrin, Advil, Goody's, BC's, all herbal medications, fish oil, and all vitamins.   Coronavirus Screening Have you or your husband experienced the following symptoms:  Cough yes/no: No Fever (>100.66F)  yes/no: No Runny nose yes/no: No Sore throat yes/no: No Difficulty breathing/shortness of breath  yes/no: No  Have you or your husband traveled in the last 14 days and where? yes/no: No

## 2019-07-03 NOTE — Anesthesia Preprocedure Evaluation (Addendum)
Anesthesia Evaluation  Patient identified by MRN, date of birth, ID band Patient awake    Reviewed: Allergy & Precautions, NPO status , Patient's Chart, lab work & pertinent test results, reviewed documented beta blocker date and time   History of Anesthesia Complications (+) PONV and history of anesthetic complications  Airway Mallampati: III  TM Distance: >3 FB Neck ROM: Full    Dental no notable dental hx. (+) Dental Advisory Given, Missing, Chipped,    Pulmonary neg pulmonary ROS,    Pulmonary exam normal        Cardiovascular hypertension, Pt. on home beta blockers and Pt. on medications Normal cardiovascular exam     Neuro/Psych PSYCHIATRIC DISORDERS Anxiety Depression tremor    GI/Hepatic Neg liver ROS, hiatal hernia, GERD  Medicated and Controlled,  Endo/Other  Hypothyroidism   Renal/GU negative Renal ROS  negative genitourinary   Musculoskeletal  (+) Arthritis , Osteoarthritis,    Abdominal   Peds negative pediatric ROS (+)  Hematology negative hematology ROS (+)   Anesthesia Other Findings   Reproductive/Obstetrics negative OB ROS                           EKG: normal sinus rhythm.  Anesthesia Physical  Anesthesia Plan  ASA: II  Anesthesia Plan: General   Post-op Pain Management:    Induction: Intravenous  PONV Risk Score and Plan: 4 or greater and Ondansetron, Dexamethasone, Scopolamine patch - Pre-op and Diphenhydramine  Airway Management Planned: Oral ETT  Additional Equipment:   Intra-op Plan:   Post-operative Plan: Extubation in OR  Informed Consent: I have reviewed the patients History and Physical, chart, labs and discussed the procedure including the risks, benefits and alternatives for the proposed anesthesia with the patient or authorized representative who has indicated his/her understanding and acceptance.     Dental advisory given  Plan  Discussed with: CRNA, Anesthesiologist and Surgeon  Anesthesia Plan Comments:        Anesthesia Quick Evaluation

## 2019-07-04 ENCOUNTER — Inpatient Hospital Stay (HOSPITAL_COMMUNITY)
Admission: RE | Admit: 2019-07-04 | Discharge: 2019-07-05 | DRG: 455 | Disposition: A | Discharge status: Home / Self Care | Payer: Medicare Other | Attending: Neurological Surgery | Admitting: Neurological Surgery

## 2019-07-04 ENCOUNTER — Encounter (HOSPITAL_COMMUNITY)
Admission: RE | Disposition: A | Discharge status: Home / Self Care | Payer: Self-pay | Source: Home / Self Care | Attending: Neurological Surgery

## 2019-07-04 ENCOUNTER — Inpatient Hospital Stay (HOSPITAL_COMMUNITY): Payer: Medicare Other | Admitting: Physician Assistant

## 2019-07-04 ENCOUNTER — Inpatient Hospital Stay (HOSPITAL_COMMUNITY): Payer: Medicare Other | Admitting: Anesthesiology

## 2019-07-04 ENCOUNTER — Inpatient Hospital Stay (HOSPITAL_COMMUNITY): Payer: Medicare Other

## 2019-07-04 ENCOUNTER — Other Ambulatory Visit: Payer: Self-pay

## 2019-07-04 ENCOUNTER — Encounter (HOSPITAL_COMMUNITY): Payer: Self-pay | Admitting: *Deleted

## 2019-07-04 DIAGNOSIS — Z419 Encounter for procedure for purposes other than remedying health state, unspecified: Secondary | ICD-10-CM

## 2019-07-04 DIAGNOSIS — Z96652 Presence of left artificial knee joint: Secondary | ICD-10-CM | POA: Diagnosis not present

## 2019-07-04 DIAGNOSIS — M5416 Radiculopathy, lumbar region: Secondary | ICD-10-CM | POA: Diagnosis present

## 2019-07-04 DIAGNOSIS — Z79890 Hormone replacement therapy (postmenopausal): Secondary | ICD-10-CM | POA: Diagnosis not present

## 2019-07-04 DIAGNOSIS — K449 Diaphragmatic hernia without obstruction or gangrene: Secondary | ICD-10-CM | POA: Diagnosis not present

## 2019-07-04 DIAGNOSIS — Z79891 Long term (current) use of opiate analgesic: Secondary | ICD-10-CM

## 2019-07-04 DIAGNOSIS — M48061 Spinal stenosis, lumbar region without neurogenic claudication: Secondary | ICD-10-CM | POA: Diagnosis not present

## 2019-07-04 DIAGNOSIS — F329 Major depressive disorder, single episode, unspecified: Secondary | ICD-10-CM | POA: Diagnosis not present

## 2019-07-04 DIAGNOSIS — E7800 Pure hypercholesterolemia, unspecified: Secondary | ICD-10-CM | POA: Diagnosis present

## 2019-07-04 DIAGNOSIS — K219 Gastro-esophageal reflux disease without esophagitis: Secondary | ICD-10-CM | POA: Diagnosis present

## 2019-07-04 DIAGNOSIS — Z79899 Other long term (current) drug therapy: Secondary | ICD-10-CM | POA: Diagnosis not present

## 2019-07-04 DIAGNOSIS — M4316 Spondylolisthesis, lumbar region: Secondary | ICD-10-CM | POA: Diagnosis present

## 2019-07-04 DIAGNOSIS — E039 Hypothyroidism, unspecified: Secondary | ICD-10-CM | POA: Diagnosis not present

## 2019-07-04 DIAGNOSIS — F419 Anxiety disorder, unspecified: Secondary | ICD-10-CM | POA: Diagnosis present

## 2019-07-04 DIAGNOSIS — Z981 Arthrodesis status: Secondary | ICD-10-CM | POA: Diagnosis not present

## 2019-07-04 DIAGNOSIS — Z90710 Acquired absence of both cervix and uterus: Secondary | ICD-10-CM | POA: Diagnosis not present

## 2019-07-04 DIAGNOSIS — I10 Essential (primary) hypertension: Secondary | ICD-10-CM | POA: Diagnosis not present

## 2019-07-04 SURGERY — POSTERIOR LUMBAR FUSION 1 LEVEL
Anesthesia: General | Site: Back

## 2019-07-04 MED ORDER — FENTANYL CITRATE (PF) 100 MCG/2ML IJ SOLN
25.0000 ug | INTRAMUSCULAR | Status: DC | PRN
  Administered 2019-07-04 (×2): 25 ug via INTRAVENOUS

## 2019-07-04 MED ORDER — MIDAZOLAM HCL 2 MG/2ML IJ SOLN
INTRAMUSCULAR | Status: AC
  Filled 2019-07-04: qty 2

## 2019-07-04 MED ORDER — POLYETHYLENE GLYCOL 3350 17 G PO PACK: 17.0000 g | PACK | Freq: Every day | ORAL | Status: DC | PRN

## 2019-07-04 MED ORDER — MENTHOL 3 MG MT LOZG: 1.0000 | LOZENGE | OROMUCOSAL | Status: DC | PRN

## 2019-07-04 MED ORDER — EPHEDRINE SULFATE-NACL 50-0.9 MG/10ML-% IV SOSY
PREFILLED_SYRINGE | INTRAVENOUS | Status: DC | PRN
  Administered 2019-07-04 (×2): 10 mg via INTRAVENOUS

## 2019-07-04 MED ORDER — SUCCINYLCHOLINE CHLORIDE 200 MG/10ML IV SOSY
PREFILLED_SYRINGE | INTRAVENOUS | Status: DC | PRN
  Administered 2019-07-04: 120 mg via INTRAVENOUS

## 2019-07-04 MED ORDER — PHENOL 1.4 % MT LIQD: 1.0000 | OROMUCOSAL | Status: DC | PRN

## 2019-07-04 MED ORDER — BUPIVACAINE HCL (PF) 0.5 % IJ SOLN
INTRAMUSCULAR | Status: AC
  Filled 2019-07-04: qty 30

## 2019-07-04 MED ORDER — DEXAMETHASONE SODIUM PHOSPHATE 10 MG/ML IJ SOLN
INTRAMUSCULAR | Status: DC | PRN
  Administered 2019-07-04: 10 mg via INTRAVENOUS

## 2019-07-04 MED ORDER — FENTANYL CITRATE (PF) 100 MCG/2ML IJ SOLN
INTRAMUSCULAR | Status: DC | PRN
  Administered 2019-07-04: 50 ug via INTRAVENOUS
  Administered 2019-07-04: 100 ug via INTRAVENOUS
  Administered 2019-07-04: 50 ug via INTRAVENOUS

## 2019-07-04 MED ORDER — MORPHINE SULFATE (PF) 2 MG/ML IV SOLN: 2.0000 mg | INTRAVENOUS | Status: DC | PRN

## 2019-07-04 MED ORDER — SODIUM CHLORIDE 0.9 % IV SOLN
INTRAVENOUS | Status: DC | PRN
  Administered 2019-07-04: 500 mL

## 2019-07-04 MED ORDER — OXYCODONE-ACETAMINOPHEN 5-325 MG PO TABS
1.0000 | ORAL_TABLET | ORAL | Status: DC | PRN
  Administered 2019-07-04 – 2019-07-05 (×4): 1 via ORAL
  Filled 2019-07-04 (×2): qty 1
  Filled 2019-07-04: qty 2
  Filled 2019-07-04: qty 1

## 2019-07-04 MED ORDER — FENTANYL CITRATE (PF) 100 MCG/2ML IJ SOLN
INTRAMUSCULAR | Status: AC
  Filled 2019-07-04: qty 2

## 2019-07-04 MED ORDER — ONDANSETRON HCL 4 MG PO TABS: 4.0000 mg | ORAL_TABLET | Freq: Four times a day (QID) | ORAL | Status: DC | PRN

## 2019-07-04 MED ORDER — ROCURONIUM BROMIDE 10 MG/ML (PF) SYRINGE
PREFILLED_SYRINGE | INTRAVENOUS | Status: DC | PRN
  Administered 2019-07-04: 50 mg via INTRAVENOUS
  Administered 2019-07-04: 10 mg via INTRAVENOUS

## 2019-07-04 MED ORDER — OMEPRAZOLE MAGNESIUM 20 MG PO TBEC: 20.0000 mg | DELAYED_RELEASE_TABLET | Freq: Every day | ORAL | Status: DC

## 2019-07-04 MED ORDER — ACETAMINOPHEN 500 MG PO TABS
1000.0000 mg | ORAL_TABLET | Freq: Once | ORAL | Status: AC
  Administered 2019-07-04: 07:00:00 1000 mg via ORAL
  Filled 2019-07-04: qty 2

## 2019-07-04 MED ORDER — PROMETHAZINE HCL 25 MG/ML IJ SOLN: 6.2500 mg | INTRAMUSCULAR | Status: DC | PRN

## 2019-07-04 MED ORDER — LEVOTHYROXINE SODIUM 75 MCG PO TABS
175.0000 ug | ORAL_TABLET | ORAL | Status: DC
  Administered 2019-07-05: 175 ug via ORAL
  Filled 2019-07-04: qty 1

## 2019-07-04 MED ORDER — KETOROLAC TROMETHAMINE 15 MG/ML IJ SOLN
7.5000 mg | Freq: Four times a day (QID) | INTRAMUSCULAR | Status: DC
  Administered 2019-07-04 – 2019-07-05 (×3): 7.5 mg via INTRAVENOUS
  Filled 2019-07-04 (×3): qty 1

## 2019-07-04 MED ORDER — LACTATED RINGERS IV SOLN: INTRAVENOUS | Status: DC

## 2019-07-04 MED ORDER — ACETAMINOPHEN 650 MG RE SUPP: 650.0000 mg | RECTAL | Status: DC | PRN

## 2019-07-04 MED ORDER — DEXAMETHASONE SODIUM PHOSPHATE 10 MG/ML IJ SOLN
INTRAMUSCULAR | Status: AC
  Filled 2019-07-04: qty 1

## 2019-07-04 MED ORDER — PHENYLEPHRINE 40 MCG/ML (10ML) SYRINGE FOR IV PUSH (FOR BLOOD PRESSURE SUPPORT)
PREFILLED_SYRINGE | INTRAVENOUS | Status: AC
  Filled 2019-07-04: qty 10

## 2019-07-04 MED ORDER — ALBUMIN HUMAN 5 % IV SOLN
INTRAVENOUS | Status: DC | PRN
  Administered 2019-07-04: 10:00:00 via INTRAVENOUS

## 2019-07-04 MED ORDER — FLEET ENEMA 7-19 GM/118ML RE ENEM: 1.0000 | ENEMA | Freq: Once | RECTAL | Status: DC | PRN

## 2019-07-04 MED ORDER — MIDAZOLAM HCL 5 MG/5ML IJ SOLN
INTRAMUSCULAR | Status: DC | PRN
  Administered 2019-07-04: 2 mg via INTRAVENOUS

## 2019-07-04 MED ORDER — CEFAZOLIN SODIUM-DEXTROSE 2-4 GM/100ML-% IV SOLN
2.0000 g | INTRAVENOUS | Status: AC
  Administered 2019-07-04: 2 g via INTRAVENOUS
  Filled 2019-07-04: qty 100

## 2019-07-04 MED ORDER — ONDANSETRON HCL 4 MG/2ML IJ SOLN
INTRAMUSCULAR | Status: DC | PRN
  Administered 2019-07-04: 4 mg via INTRAVENOUS

## 2019-07-04 MED ORDER — METOPROLOL SUCCINATE ER 50 MG PO TB24
50.0000 mg | ORAL_TABLET | Freq: Every day | ORAL | Status: DC
  Administered 2019-07-05: 09:00:00 50 mg via ORAL
  Filled 2019-07-04: qty 1

## 2019-07-04 MED ORDER — PHENYLEPHRINE 40 MCG/ML (10ML) SYRINGE FOR IV PUSH (FOR BLOOD PRESSURE SUPPORT)
PREFILLED_SYRINGE | INTRAVENOUS | Status: DC | PRN
  Administered 2019-07-04 (×4): 80 ug via INTRAVENOUS

## 2019-07-04 MED ORDER — DOCUSATE SODIUM 100 MG PO CAPS
100.0000 mg | ORAL_CAPSULE | Freq: Two times a day (BID) | ORAL | Status: DC
  Administered 2019-07-04 – 2019-07-05 (×2): 100 mg via ORAL
  Filled 2019-07-04 (×2): qty 1

## 2019-07-04 MED ORDER — LIDOCAINE 2% (20 MG/ML) 5 ML SYRINGE
INTRAMUSCULAR | Status: DC | PRN
  Administered 2019-07-04: 100 mg via INTRAVENOUS

## 2019-07-04 MED ORDER — SCOPOLAMINE 1 MG/3DAYS TD PT72
1.0000 | MEDICATED_PATCH | TRANSDERMAL | Status: DC
  Administered 2019-07-04: 1.5 mg via TRANSDERMAL
  Filled 2019-07-04: qty 1

## 2019-07-04 MED ORDER — KETAMINE HCL 10 MG/ML IJ SOLN
INTRAMUSCULAR | Status: DC | PRN
  Administered 2019-07-04: 10 mg via INTRAVENOUS
  Administered 2019-07-04: 30 mg via INTRAVENOUS

## 2019-07-04 MED ORDER — FENTANYL CITRATE (PF) 250 MCG/5ML IJ SOLN
INTRAMUSCULAR | Status: AC
  Filled 2019-07-04: qty 5

## 2019-07-04 MED ORDER — THROMBIN 5000 UNITS EX SOLR
CUTANEOUS | Status: AC
  Filled 2019-07-04: qty 5000

## 2019-07-04 MED ORDER — SODIUM CHLORIDE 0.9% FLUSH: 3.0000 mL | INTRAVENOUS | Status: DC | PRN

## 2019-07-04 MED ORDER — SENNA 8.6 MG PO TABS
1.0000 | ORAL_TABLET | Freq: Two times a day (BID) | ORAL | Status: DC
  Administered 2019-07-04 – 2019-07-05 (×2): 8.6 mg via ORAL
  Filled 2019-07-04 (×2): qty 1

## 2019-07-04 MED ORDER — PROPOFOL 10 MG/ML IV BOLUS
INTRAVENOUS | Status: DC | PRN
  Administered 2019-07-04: 200 mg via INTRAVENOUS

## 2019-07-04 MED ORDER — SODIUM CHLORIDE 0.9 % IV SOLN
INTRAVENOUS | Status: DC | PRN
  Administered 2019-07-04: 30 ug/min via INTRAVENOUS

## 2019-07-04 MED ORDER — CHLORHEXIDINE GLUCONATE CLOTH 2 % EX PADS: 6.0000 | MEDICATED_PAD | Freq: Once | CUTANEOUS | Status: DC

## 2019-07-04 MED ORDER — TRAMADOL HCL 50 MG PO TABS: 50.0000 mg | ORAL_TABLET | Freq: Two times a day (BID) | ORAL | Status: DC | PRN

## 2019-07-04 MED ORDER — ACETAMINOPHEN 325 MG PO TABS: 650.0000 mg | ORAL_TABLET | ORAL | Status: DC | PRN

## 2019-07-04 MED ORDER — CELECOXIB 200 MG PO CAPS
400.0000 mg | ORAL_CAPSULE | Freq: Once | ORAL | Status: AC
  Administered 2019-07-04: 07:00:00 400 mg via ORAL
  Filled 2019-07-04: qty 2

## 2019-07-04 MED ORDER — CEFAZOLIN SODIUM-DEXTROSE 2-4 GM/100ML-% IV SOLN
2.0000 g | Freq: Three times a day (TID) | INTRAVENOUS | Status: AC
  Administered 2019-07-04 – 2019-07-05 (×2): 2 g via INTRAVENOUS
  Filled 2019-07-04 (×2): qty 100

## 2019-07-04 MED ORDER — ROCURONIUM BROMIDE 10 MG/ML (PF) SYRINGE
PREFILLED_SYRINGE | INTRAVENOUS | Status: AC
  Filled 2019-07-04: qty 10

## 2019-07-04 MED ORDER — 0.9 % SODIUM CHLORIDE (POUR BTL) OPTIME
TOPICAL | Status: DC | PRN
  Administered 2019-07-04: 1000 mL

## 2019-07-04 MED ORDER — DIMENHYDRINATE 50 MG PO TABS
50.0000 mg | ORAL_TABLET | Freq: Three times a day (TID) | ORAL | Status: DC | PRN
  Filled 2019-07-04: qty 1

## 2019-07-04 MED ORDER — ONDANSETRON HCL 4 MG/2ML IJ SOLN
INTRAMUSCULAR | Status: AC
  Filled 2019-07-04: qty 2

## 2019-07-04 MED ORDER — METHOCARBAMOL 500 MG PO TABS
500.0000 mg | ORAL_TABLET | Freq: Four times a day (QID) | ORAL | Status: DC | PRN
  Administered 2019-07-04: 500 mg via ORAL
  Filled 2019-07-04: qty 1

## 2019-07-04 MED ORDER — ALPRAZOLAM 0.25 MG PO TABS: 0.2500 mg | ORAL_TABLET | Freq: Two times a day (BID) | ORAL | Status: DC | PRN

## 2019-07-04 MED ORDER — SUCCINYLCHOLINE CHLORIDE 200 MG/10ML IV SOSY
PREFILLED_SYRINGE | INTRAVENOUS | Status: AC
  Filled 2019-07-04: qty 10

## 2019-07-04 MED ORDER — ONDANSETRON HCL 4 MG/2ML IJ SOLN: 4.0000 mg | Freq: Four times a day (QID) | INTRAMUSCULAR | Status: DC | PRN

## 2019-07-04 MED ORDER — THROMBIN 5000 UNITS EX SOLR
OROMUCOSAL | Status: DC | PRN
  Administered 2019-07-04: 5 mL via TOPICAL

## 2019-07-04 MED ORDER — BUPIVACAINE HCL (PF) 0.5 % IJ SOLN
INTRAMUSCULAR | Status: DC | PRN
  Administered 2019-07-04: 5 mL

## 2019-07-04 MED ORDER — LIDOCAINE-EPINEPHRINE 1 %-1:100000 IJ SOLN
INTRAMUSCULAR | Status: AC
  Filled 2019-07-04: qty 1

## 2019-07-04 MED ORDER — LIDOCAINE-EPINEPHRINE 1 %-1:100000 IJ SOLN
INTRAMUSCULAR | Status: DC | PRN
  Administered 2019-07-04: 5 mL

## 2019-07-04 MED ORDER — LACTATED RINGERS IV SOLN
INTRAVENOUS | Status: DC | PRN
  Administered 2019-07-04 (×2): via INTRAVENOUS

## 2019-07-04 MED ORDER — SODIUM CHLORIDE 0.9% FLUSH
3.0000 mL | Freq: Two times a day (BID) | INTRAVENOUS | Status: DC
  Administered 2019-07-04: 3 mL via INTRAVENOUS

## 2019-07-04 MED ORDER — PROPOFOL 10 MG/ML IV BOLUS
INTRAVENOUS | Status: AC
  Filled 2019-07-04: qty 20

## 2019-07-04 MED ORDER — DULOXETINE HCL 30 MG PO CPEP
60.0000 mg | ORAL_CAPSULE | Freq: Every day | ORAL | Status: DC
  Administered 2019-07-04: 60 mg via ORAL
  Filled 2019-07-04: qty 2

## 2019-07-04 MED ORDER — ACETAMINOPHEN 325 MG PO TABS: 650.0000 mg | ORAL_TABLET | Freq: Four times a day (QID) | ORAL | Status: DC | PRN

## 2019-07-04 MED ORDER — TAMOXIFEN CITRATE 10 MG PO TABS
20.0000 mg | ORAL_TABLET | Freq: Every day | ORAL | Status: DC
  Administered 2019-07-04: 21:00:00 20 mg via ORAL
  Filled 2019-07-04 (×2): qty 2

## 2019-07-04 MED ORDER — SUGAMMADEX SODIUM 200 MG/2ML IV SOLN
INTRAVENOUS | Status: DC | PRN
  Administered 2019-07-04: 200 mg via INTRAVENOUS

## 2019-07-04 MED ORDER — METHOCARBAMOL 1000 MG/10ML IJ SOLN
500.0000 mg | Freq: Four times a day (QID) | INTRAVENOUS | Status: DC | PRN
  Filled 2019-07-04: qty 5

## 2019-07-04 MED ORDER — EPHEDRINE 5 MG/ML INJ
INTRAVENOUS | Status: AC
  Filled 2019-07-04: qty 10

## 2019-07-04 MED ORDER — ALUM & MAG HYDROXIDE-SIMETH 200-200-20 MG/5ML PO SUSP: 30.0000 mL | Freq: Four times a day (QID) | ORAL | Status: DC | PRN

## 2019-07-04 MED ORDER — LIDOCAINE 2% (20 MG/ML) 5 ML SYRINGE
INTRAMUSCULAR | Status: AC
  Filled 2019-07-04: qty 5

## 2019-07-04 MED ORDER — PANTOPRAZOLE SODIUM 40 MG PO TBEC: 40.0000 mg | DELAYED_RELEASE_TABLET | Freq: Every day | ORAL | Status: DC

## 2019-07-04 MED ORDER — KETAMINE HCL 50 MG/5ML IJ SOSY
PREFILLED_SYRINGE | INTRAMUSCULAR | Status: AC
  Filled 2019-07-04: qty 5

## 2019-07-04 MED ORDER — BISACODYL 10 MG RE SUPP: 10.0000 mg | Freq: Every day | RECTAL | Status: DC | PRN

## 2019-07-04 SURGICAL SUPPLY — 66 items
ADH SKN CLS APL DERMABOND .7 (GAUZE/BANDAGES/DRESSINGS) ×1
APL SRG 60D 8 XTD TIP BNDBL (TIP)
BAG DECANTER FOR FLEXI CONT (MISCELLANEOUS) ×2 IMPLANT
BASKET BONE COLLECTION (BASKET) ×2 IMPLANT
BLADE CLIPPER SURG (BLADE) IMPLANT
BONE CANC CHIPS 20CC PCAN1/4 (Bone Implant) ×2 IMPLANT
BUR MATCHSTICK NEURO 3.0 LAGG (BURR) ×2 IMPLANT
CAGE PLIF 8X9X23-12 LUMBAR (Cage) ×2 IMPLANT
CANISTER SUCT 3000ML PPV (MISCELLANEOUS) ×2 IMPLANT
CHIPS CANC BONE 20CC PCAN1/4 (Bone Implant) ×1 IMPLANT
CONT SPEC 4OZ CLIKSEAL STRL BL (MISCELLANEOUS) ×2 IMPLANT
COVER BACK TABLE 60X90IN (DRAPES) ×2 IMPLANT
COVER WAND RF STERILE (DRAPES) ×2 IMPLANT
DECANTER SPIKE VIAL GLASS SM (MISCELLANEOUS) ×2 IMPLANT
DERMABOND ADVANCED (GAUZE/BANDAGES/DRESSINGS) ×1
DERMABOND ADVANCED .7 DNX12 (GAUZE/BANDAGES/DRESSINGS) ×1 IMPLANT
DEVICE DISSECT PLASMABLAD 3.0S (MISCELLANEOUS) ×1 IMPLANT
DRAPE C-ARM 42X72 X-RAY (DRAPES) ×4 IMPLANT
DRAPE HALF SHEET 40X57 (DRAPES) IMPLANT
DRAPE LAPAROTOMY 100X72X124 (DRAPES) ×2 IMPLANT
DURAPREP 26ML APPLICATOR (WOUND CARE) ×2 IMPLANT
DURASEAL APPLICATOR TIP (TIP) IMPLANT
DURASEAL SPINE SEALANT 3ML (MISCELLANEOUS) IMPLANT
ELECT REM PT RETURN 9FT ADLT (ELECTROSURGICAL) ×2
ELECTRODE REM PT RTRN 9FT ADLT (ELECTROSURGICAL) ×1 IMPLANT
GAUZE 4X4 16PLY RFD (DISPOSABLE) IMPLANT
GAUZE SPONGE 4X4 12PLY STRL (GAUZE/BANDAGES/DRESSINGS) ×2 IMPLANT
GLOVE BIOGEL PI IND STRL 7.0 (GLOVE) IMPLANT
GLOVE BIOGEL PI IND STRL 8.5 (GLOVE) ×2 IMPLANT
GLOVE BIOGEL PI INDICATOR 7.0 (GLOVE) ×3
GLOVE BIOGEL PI INDICATOR 8.5 (GLOVE) ×2
GLOVE ECLIPSE 8.5 STRL (GLOVE) ×4 IMPLANT
GLOVE SURG SS PI 7.0 STRL IVOR (GLOVE) ×2 IMPLANT
GOWN STRL REUS W/ TWL LRG LVL3 (GOWN DISPOSABLE) IMPLANT
GOWN STRL REUS W/ TWL XL LVL3 (GOWN DISPOSABLE) IMPLANT
GOWN STRL REUS W/TWL 2XL LVL3 (GOWN DISPOSABLE) ×4 IMPLANT
GOWN STRL REUS W/TWL LRG LVL3 (GOWN DISPOSABLE) ×4
GOWN STRL REUS W/TWL XL LVL3 (GOWN DISPOSABLE)
GRAFT BNE CANC CHIPS 1-8 20CC (Bone Implant) IMPLANT
HEMOSTAT POWDER KIT SURGIFOAM (HEMOSTASIS) ×1 IMPLANT
KIT BASIN OR (CUSTOM PROCEDURE TRAY) ×2 IMPLANT
KIT INFUSE X SMALL 1.4CC (Orthopedic Implant) ×1 IMPLANT
KIT TURNOVER KIT B (KITS) ×2 IMPLANT
MILL MEDIUM DISP (BLADE) ×2 IMPLANT
NDL SPNL 18GX3.5 QUINCKE PK (NEEDLE) IMPLANT
NEEDLE HYPO 22GX1.5 SAFETY (NEEDLE) ×2 IMPLANT
NEEDLE SPNL 18GX3.5 QUINCKE PK (NEEDLE) ×4 IMPLANT
NS IRRIG 1000ML POUR BTL (IV SOLUTION) ×2 IMPLANT
PACK LAMINECTOMY NEURO (CUSTOM PROCEDURE TRAY) ×2 IMPLANT
PATTIES SURGICAL .5 X1 (DISPOSABLE) ×2 IMPLANT
PLASMABLADE 3.0S (MISCELLANEOUS) ×2
ROD RELINE-O LORD 5.5X35MM (Rod) ×2 IMPLANT
SCREW LOCK RELINE 5.5 TULIP (Screw) ×4 IMPLANT
SCREW RELINE-O POLY 6.5X45 (Screw) ×4 IMPLANT
SPONGE LAP 4X18 RFD (DISPOSABLE) IMPLANT
SPONGE SURGIFOAM ABS GEL 100 (HEMOSTASIS) ×2 IMPLANT
SUT PROLENE 6 0 BV (SUTURE) IMPLANT
SUT VIC AB 1 CT1 18XBRD ANBCTR (SUTURE) ×1 IMPLANT
SUT VIC AB 1 CT1 8-18 (SUTURE) ×2
SUT VIC AB 2-0 CP2 18 (SUTURE) ×2 IMPLANT
SUT VIC AB 3-0 SH 8-18 (SUTURE) ×3 IMPLANT
SYR 3ML LL SCALE MARK (SYRINGE) ×8 IMPLANT
TOWEL GREEN STERILE (TOWEL DISPOSABLE) ×2 IMPLANT
TOWEL GREEN STERILE FF (TOWEL DISPOSABLE) ×2 IMPLANT
TRAY FOLEY MTR SLVR 16FR STAT (SET/KITS/TRAYS/PACK) ×2 IMPLANT
WATER STERILE IRR 1000ML POUR (IV SOLUTION) ×2 IMPLANT

## 2019-07-04 NOTE — Progress Notes (Signed)
Patient ID: Kathryn [name], female   DOB: 01/19/1956, 63 y.o.   MRN: [id number] All signs are stable Patient is awake and alert She is ambulated already Her back feels reasonably comfortable She is pleased with the relief she is experiencing Stable

## 2019-07-04 NOTE — Progress Notes (Signed)
Orthopedic Tech Progress Note Patient Details:  Kathryn [name] 1956-11-18 [id number] RN said patient has brace Patient ID: Kathryn [name], female   DOB: [birth year], 63 y.o.   MRN: [id number]   Kathryn [name] 07/04/2019, 2:08 PM

## 2019-07-04 NOTE — Anesthesia Postprocedure Evaluation (Signed)
Anesthesia Post Note  Patient: Kathryn [name]  Procedure(s) Performed: Lumbar four-five-Posterior lumbar interbody fusion (N/A Back)     Patient location during evaluation: PACU Anesthesia Type: General Level of consciousness: sedated Pain management: pain level controlled Vital Signs Assessment: post-procedure vital signs reviewed and stable Respiratory status: spontaneous breathing and respiratory function stable Cardiovascular status: stable Postop Assessment: no apparent nausea or vomiting Anesthetic complications: no    Last Vitals:  Vitals:   07/04/19 1225 07/04/19 1250  BP: 117/61 118/66  Pulse: 68 68  Resp: 16 16  Temp:  36.8 C  SpO2: 95% 98%    Last Pain:  Vitals:   07/04/19 1250  TempSrc:   PainSc: 4     LLE Motor Response: Purposeful movement;No tremor;Responds to commands;Responds to sound (07/04/19 1250) LLE Sensation: Full sensation;No numbness;No pain;No tingling (07/04/19 1250) RLE Motor Response: Purposeful movement;No tremor;Responds to commands;Responds to sound (07/04/19 1250) RLE Sensation: Full sensation;No pain;No numbness;No tingling (07/04/19 1250)      [place]

## 2019-07-04 NOTE — Evaluation (Signed)
Physical Therapy Evaluation Patient Details Name: Kathryn [name] MRN: [id number] DOB: May 29, 1956 Today's Date: 07/04/2019   History of Present Illness  Pt is a 63 y/o female s/p L4-5 PLIF. PMH includes R breast cancer, HTN, tremor, anxiety, and L TKA.   Clinical Impression  Patient is s/p above surgery resulting in the deficits listed below (see PT Problem List). Pt requiring min guard A for mobility tasks this session. Educated pt about back precautions and generalized walking program. Patient will benefit from skilled PT to increase their independence and safety with mobility (while adhering to their precautions) to allow discharge to the venue listed below.     Follow Up Recommendations No PT follow up;Supervision for mobility/OOB    Equipment Recommendations  None recommended by PT    Recommendations for Other Services       Precautions / Restrictions Precautions Precautions: Back Precaution Booklet Issued: Yes (comment) Precaution Comments: Reviewed back precautions with pt.  Required Braces or Orthoses: Spinal Brace Spinal Brace: Lumbar corset;Applied in sitting position Restrictions Weight Bearing Restrictions: No      Mobility  Bed Mobility Overal bed mobility: Needs Assistance Bed Mobility: Rolling;Sidelying to Sit;Sit to Sidelying Rolling: Supervision Sidelying to sit: Supervision     Sit to sidelying: Supervision General bed mobility comments: Supervision for safety. Heavy use of bed rails. Cues for sequencing when using log roll technique.   Transfers Overall transfer level: Needs assistance Equipment used: None Transfers: Sit to/from Stand Sit to Stand: Min guard         General transfer comment: Min guard for steadying assist. Increased time required to stand.   Ambulation/Gait Ambulation/Gait assistance: Min guard Gait Distance (Feet): 200 Feet Assistive device: IV Pole Gait Pattern/deviations: Step-through pattern;Decreased stride length Gait  velocity: Decreased   General Gait Details: Slow, guarded gait. Min guard for safety. No overt LOB noted. Educated about generalized walking program to perform at home.   Stairs            Wheelchair Mobility    Modified Rankin (Stroke Patients Only)       Balance Overall balance assessment: Needs assistance Sitting-balance support: No upper extremity supported;Feet supported Sitting balance-Leahy Scale: Fair     Standing balance support: No upper extremity supported;During functional activity Standing balance-Leahy Scale: Fair                               Pertinent Vitals/Pain Pain Assessment: Faces Faces Pain Scale: Hurts little more Pain Location: back  Pain Descriptors / Indicators: Aching;Operative site guarding Pain Intervention(s): Limited activity within patient's tolerance;Monitored during session;Repositioned    Home Living Family/patient expects to be discharged to:: Private residence Living Arrangements: Spouse/significant other Available Help at Discharge: Family;Available 24 hours/day Type of Home: House Home Access: Stairs to enter Entrance Stairs-Rails: Right;Left;Can reach both Entrance Stairs-Number of Steps: 3 Home Layout: One level Home Equipment: Shower seat - built in;Toilet riser;Walker - 2 wheels      Prior Function Level of Independence: Independent               Hand Dominance        Extremity/Trunk Assessment   Upper Extremity Assessment Upper Extremity Assessment: Defer to OT evaluation    Lower Extremity Assessment Lower Extremity Assessment: Generalized weakness    Cervical / Trunk Assessment Cervical / Trunk Assessment: Other exceptions Cervical / Trunk Exceptions: s/p PLIF  Communication   Communication: No difficulties  Cognition Arousal/Alertness:  Awake/alert Behavior During Therapy: WFL for tasks assessed/performed Overall Cognitive Status: Within Functional Limits for tasks assessed                                         General Comments General comments (skin integrity, edema, etc.): Pt's husband present throughout session.     Exercises     Assessment/Plan    PT Assessment Patient needs continued PT services  PT Problem List Decreased strength;Decreased mobility;Decreased knowledge of precautions;Decreased balance;Pain       PT Treatment Interventions Gait training;Stair training;Functional mobility training;Therapeutic exercise;Therapeutic activities;Balance training;Patient/family education    PT Goals (Current goals can be found in the Care Plan section)  Acute Rehab PT Goals Patient Stated Goal: to go home PT Goal Formulation: With patient Time For Goal Achievement: 07/18/19 Potential to Achieve Goals: Good    Frequency Min 5X/week   Barriers to discharge        Co-evaluation               AM-PAC PT "6 Clicks" Mobility  Outcome Measure Help needed turning from your back to your side while in a flat bed without using bedrails?: A Little Help needed moving from lying on your back to sitting on the side of a flat bed without using bedrails?: A Little Help needed moving to and from a bed to a chair (including a wheelchair)?: A Little Help needed standing up from a chair using your arms (e.g., wheelchair or bedside chair)?: A Little Help needed to walk in hospital room?: A Little Help needed climbing 3-5 steps with a railing? : A Little 6 Click Score: 18    End of Session Equipment Utilized During Treatment: Gait belt Activity Tolerance: Patient tolerated treatment well Patient left: in bed;with call bell/phone within reach;with family/visitor present Nurse Communication: Mobility status PT Visit Diagnosis: Other abnormalities of gait and mobility (R26.89);Pain Pain - part of body: (back)    Time: 7846-9629 PT Time Calculation (min) (ACUTE ONLY): 20 min   Charges:   PT Evaluation $PT Eval Low Complexity: [place], PT, DPT  Acute Rehabilitation Services  Pager: [phone] Office: [phone]   Rudean Hitt 07/04/2019, 3:18 PM

## 2019-07-04 NOTE — H&P (Signed)
Kathryn [name] is an 63 y.o. female.   Chief Complaint: Back and bilateral leg pain, spondylolisthesis HPI: Kathryn [name] is a 63 year old individual whose had significant problems with back pain and lower extremity pain over the years.  She has had a progressive spondylolisthesis that has been treated conservatively however recently she has had difficulty walking difficulty maintaining her balance and has evidence of bilateral foot drops.  She notes that the pain has become increasingly intolerable also mostly involving the buttocks and lower extremities.  She has been advised previously about surgical decompression and arthrodesis at L4-L5 and at this point she is agreeable to proceeding.  She has had previous issues with her back including a compression fracture of the L1 vertebrae that was treated with an acrylic balloon kyphoplasty.  Past Medical History:  Diagnosis Date  . Anxiety   . Cancer ([place]) 10/2016   Breast right  . Depression   . Elevated cholesterol   . GERD (gastroesophageal reflux disease)   . H/O hiatal hernia   . Hypertension   . Hypothyroidism   . PONV (postoperative nausea and vomiting)    felt cut with one of past knee surgeries  . Tremor    patient states "tremor has gotten better" on 06/30/19  . Wears partial dentures    upper    Past Surgical History:  Procedure Laterality Date  . ABDOMINAL HYSTERECTOMY  yrs ago  . BREAST EXCISIONAL BIOPSY Right 11/2016   ALH and papillomas  . BREAST LUMPECTOMY WITH RADIOACTIVE SEED LOCALIZATION Right 12/15/2016   Procedure: RADIOACTIVE SEED X 2 GUIDED EXCISIONAL BREAST BIOPSY;  Surgeon: Rolm Bookbinder, MD;  Location: [facility];  Service: General;  Laterality: Right;  . CARPAL TUNNEL RELEASE Bilateral yrs ago   x 2  . cervical neck fusion  yrs ago   C 3 and C 4   . COLONOSCOPY    . left knee arthroscopy   last done 2008   x 3  . left shoulder arthroscopy  yrs ago  . lower back surgery  2005  . right  total knee replacement   2008  . TOTAL KNEE ARTHROPLASTY  01/14/2012   Procedure: TOTAL KNEE ARTHROPLASTY;  Surgeon: Johnn Hai, MD;  Location: [facility];  Service: Orthopedics;  Laterality: Left;  preop femoral nerve block  . UPPER GI ENDOSCOPY    . WISDOM TOOTH EXTRACTION      History reviewed. No pertinent family history. Social History:  reports that she has never smoked. She has never used smokeless tobacco. She reports current alcohol use. She reports that she does not use drugs.  Allergies: No Known Allergies  Medications Prior to Admission  Medication Sig Dispense Refill  . acetaminophen (TYLENOL) 325 MG tablet Take 650 mg by mouth every 6 (six) hours as needed for mild pain.     Marland Kitchen ALPRAZolam (XANAX) 0.25 MG tablet Take 0.25 mg by mouth 2 (two) times daily as needed for anxiety or sleep.     Marland Kitchen dimenhyDRINATE (MOTION SICKNESS PO) Take 1 tablet by mouth daily with breakfast.     . DULoxetine (CYMBALTA) 60 MG capsule Take 60 mg by mouth at bedtime.    Marland Kitchen levothyroxine (SYNTHROID, LEVOTHROID) 175 MCG tablet Take 175 mcg by mouth See admin instructions. Take 175 mcg by mouth daily before breakfast except Sunday    . metoprolol succinate (TOPROL-XL) 50 MG 24 hr tablet Take 50 mg by mouth daily. Take with or immediately following a meal.    .  omeprazole (PRILOSEC OTC) 20 MG tablet Take 20 mg by mouth daily.    . tamoxifen (NOLVADEX) 20 MG tablet TAKE 1 TABLET BY MOUTH DAILY (Patient taking differently: Take 20 mg by mouth daily. ) 90 tablet 12  . traMADol (ULTRAM) 50 MG tablet Take 50 mg by mouth 2 (two) times daily as needed for moderate pain.       No results found for this or any previous visit (from the past 48 hour(s)). No results found.  Review of Systems  Constitutional: Negative.   HENT: Negative.   Eyes: Negative.   Respiratory: Negative.   Cardiovascular: Negative.   Gastrointestinal: Negative.   Genitourinary: Negative.   Musculoskeletal: Positive for back pain.   Skin: Negative.   Neurological: Positive for tingling and focal weakness.  Endo/Heme/Allergies: Negative.   Psychiatric/Behavioral: Negative.     Blood pressure (!) 148/94, pulse 78, temperature 98.4 F (36.9 C), temperature source Oral, resp. rate 20, height 5\' 6"  (1.676 m), weight 101.6 kg, SpO2 99 %. Physical Exam  Constitutional: She is oriented to person, place, and time. She appears well-developed and well-nourished.  HENT:  Head: Normocephalic and atraumatic.  Eyes: Pupils are equal, round, and reactive to light. Conjunctivae and EOM are normal.  Neck: Normal range of motion. Neck supple.  Cardiovascular: Normal rate and regular rhythm.  Respiratory: Effort normal and breath sounds normal.  GI: Soft. Bowel sounds are normal.  Neurological: She is alert and oriented to person, place, and time. No cranial nerve deficit. She exhibits normal muscle tone. Coordination normal.  Absent patellar and Achilles reflexes bilaterally positive straight leg raising at 30 degrees Patrick's maneuver is negative bilaterally.  Skin: Skin is warm and dry.  Psychiatric: She has a normal mood and affect. Her behavior is normal. Judgment and thought content normal.     Assessment/Plan Spondylolisthesis L4-L5 with severe stenosis lumbar radiculopathy.  Plan: Decompression and fusion L4-L5.  Earleen Newport, MD 07/04/2019, 7:51 AM

## 2019-07-04 NOTE — Op Note (Signed)
Date of operation: 07/04/2019 Preoperative diagnosis: L4-L5 spondylolisthesis with lumbar radiculopathy, neurogenic claudication Postoperative diagnosis: Same Procedure: Bilateral laminotomies L4-L5 with decompression of the L4 and L5 nerve roots posterior lumbar interbody arthrodesis with local autograft allograft infuse and peek spacers.  Posterior lateral arthrodesis with local autograft allograft and infuse.  Pedicle screw fixation L4-L5.  Surgeon Kristeen Miss first Assistant Dr. Deatra Ina anesthesia General endotracheal. Indications: Patient is a 63 year old female who has had significant back and leg pain with weakness.  She has an advanced spondylolisthesis at L4-L5 has been getting progressively worse.  Is been advised regarding surgical decompression and stabilization.  Procedure: Patient was brought to the operating room supine on a stretcher.  After the smooth induction of general endotracheal anesthesia, she was carefully turned prone.  The back was prepped with alcohol DuraPrep and draped in a sterile fashion.  Midline incision was created and carried down to the lumbodorsal fascia.  Dissection was carried out on either side of midline to expose the spinous process and laminar arch of L4 and L5.  Was identified positively with a radiograph.  Then the large laminotomy was created removing the inferior margin lamina of L4 out to and including the entirety of the facet at L4-L5 this was first done on the left side where it appeared that there had been previous surgery.  Once this facet was removed epidural fibrosis was encountered and this was carefully dissected to expose the common dural tube superiorly the path of the L4 nerve root was decompressed inferiorly the path of the L5 nerve root was decompressed.  Ultimately the disc space was isolated.  Once this had occurred then attention was turned to the opposite side where the same process was carried out here there was not much epidural  fibrosis.  The L4 and L5 nerve roots were similarly decompressed using combination of curettes and rongeurs.  High-speed drill was also used and then when the disc space was isolated a discectomy was performed bilaterally at L4-L5 removing substantial quantities of severely degenerated and desiccated disc material.  Interspace was then opened to allow distraction of the disc space and further cleaning of the degenerated disc material once all the disc had been removed the interspace was sized for appropriately sized spacers.  It was felt that a 8 x 9 x 23 mm spacer with 12 degrees lordosis would fit best into this interval.  This was filled with autograft allograft and infuse and packed into the interspace a total of 16 cc of bone graft was also packed into the interspace.  Once the space was filled with autograft allograft and infuse and the 2 spacers were set pedicle entry sites were chosen at L4 and L5 6.5 x 45 mm screws were placed in L4 and L5 and connected with precontoured 35 mm rods and a neutral construct.  Once this was torqued into final position lateral gutters had been filled with a total of 6 cc of additional autograft allograft and infuse after being decorticated and then hemostasis was checked final radiographs were obtained in AP and lateral projection and showed good reduction of the spondylolisthesis.  The pads of the L4 and L5 nerve root were checked for clearance and when adequate the retractors were removed hemostasis was checked in the soft tissues and the lumbodorsal fascia was closed with #1 Vicryl interrupted fashion 2-0 Vicryl was used in subcutaneous tissues and 3-0 Vicryl subcuticularly.  Dermabond was placed on the skin.  Patient was returned to recovery room  in stable condition for further postoperative monitoring.  Blood loss was estimated at 450 cc

## 2019-07-04 NOTE — Anesthesia Procedure Notes (Signed)
Procedure Name: Intubation Date/Time: 07/04/2019 7:57 AM Performed by: Jenne Campus, CRNA Pre-anesthesia Checklist: Patient identified, Emergency Drugs available, Suction available and Patient being monitored Patient Re-evaluated:Patient Re-evaluated prior to induction Oxygen Delivery Method: Circle System Utilized Preoxygenation: Pre-oxygenation with 100% oxygen Induction Type: IV induction Ventilation: Mask ventilation without difficulty Laryngoscope Size: Miller and 3 Grade View: Grade II Tube type: Oral Tube size: 7.5 mm Number of attempts: 1 Airway Equipment and Method: Stylet and Oral airway Placement Confirmation: ETT inserted through vocal cords under direct vision,  positive ETCO2 and breath sounds checked- equal and bilateral Secured at: 21 cm Tube secured with: Tape Dental Injury: Teeth and Oropharynx as per pre-operative assessment

## 2019-07-04 NOTE — Transfer of Care (Signed)
Immediate Anesthesia Transfer of Care Note  Patient: Kathryn [name]  Procedure(s) Performed: Lumbar four-five-Posterior lumbar interbody fusion (N/A Back)  Patient Location: PACU  Anesthesia Type:General  Level of Consciousness: awake, sedated and patient cooperative  Airway & Oxygen Therapy: Patient Spontanous Breathing and Patient connected to face mask oxygen  Post-op Assessment: Report given to RN and Post -op Vital signs reviewed and stable  Post vital signs: Reviewed  Last Vitals:  Vitals Value Taken Time  BP 120/87 07/04/19 1141  Temp    Pulse 69 07/04/19 1144  Resp 15 07/04/19 1144  SpO2 96 % 07/04/19 1144  Vitals shown include unvalidated device data.  Last Pain:  Vitals:   07/04/19 0641  TempSrc:   PainSc: 7       Patients Stated Pain Goal: 3 (50/56/97 9480)  Complications: No apparent anesthesia complications

## 2019-07-05 LAB — CBC
HCT: 31 % — ABNORMAL LOW (ref 36.0–46.0)
Hemoglobin: 9.7 g/dL — ABNORMAL LOW (ref 12.0–15.0)
MCH: 30.2 pg (ref 26.0–34.0)
MCHC: 31.3 g/dL (ref 30.0–36.0)
MCV: 96.6 fL (ref 80.0–100.0)
Platelets: 190 10*3/uL (ref 150–400)
RBC: 3.21 MIL/uL — ABNORMAL LOW (ref 3.87–5.11)
RDW: 14.3 % (ref 11.5–15.5)
WBC: 10.3 10*3/uL (ref 4.0–10.5)
nRBC: 0 % (ref 0.0–0.2)

## 2019-07-05 LAB — BASIC METABOLIC PANEL
Anion gap: 11 (ref 5–15)
BUN: 15 mg/dL (ref 8–23)
CO2: 25 mmol/L (ref 22–32)
Calcium: 8.2 mg/dL — ABNORMAL LOW (ref 8.9–10.3)
Chloride: 100 mmol/L (ref 98–111)
Creatinine, Ser: 0.97 mg/dL (ref 0.44–1.00)
GFR calc Af Amer: >60 mL/min (ref >60)
GFR calc non Af Amer: >60 mL/min (ref >60)
Glucose, Bld: 127 mg/dL — ABNORMAL HIGH (ref 70–99)
Potassium: 4.5 mmol/L (ref 3.5–5.1)
Sodium: 136 mmol/L (ref 135–145)

## 2019-07-05 MED ORDER — METHOCARBAMOL 500 MG PO TABS: 500.0000 mg | ORAL_TABLET | Freq: Four times a day (QID) | ORAL | 3 refills | Status: DC | PRN

## 2019-07-05 MED ORDER — OXYCODONE-ACETAMINOPHEN 5-325 MG PO TABS: 1.0000 | ORAL_TABLET | ORAL | 0 refills | Status: DC | PRN

## 2019-07-05 NOTE — Progress Notes (Signed)
Physical Therapy Treatment Patient Details Name: Kathryn [name] MRN: [id number] DOB: 1956/11/11 Today's Date: 07/05/2019    History of Present Illness Pt is a 63 y/o female s/p L4-5 PLIF. PMH includes R breast cancer, HTN, tremor, anxiety, and L TKA.     PT Comments    Pt progressing towards physical therapy goals. Was able to perform transfers and ambulation with gross modified independence and no AD. Pt was educated on precautions, brace wearing schedule, recommended activity progression, and car transfer. Pt was also instructed on stair negotiation and completed with light supervision. She anticipates d/c home today. Will sign off at this time as pt has met acute PT goals. If needs change, please reconsult.     Follow Up Recommendations  No PT follow up;Supervision for mobility/OOB     Equipment Recommendations  None recommended by PT    Recommendations for Other Services       Precautions / Restrictions Precautions Precautions: Back Precaution Booklet Issued: Yes (comment) Precaution Comments: Reviewed back precautions with pt.  Required Braces or Orthoses: Spinal Brace Spinal Brace: Lumbar corset;Applied in sitting position Restrictions Weight Bearing Restrictions: No    Mobility  Bed Mobility Overal bed mobility: Needs Assistance Bed Mobility: Sit to Sidelying;Rolling Rolling: Supervision       Sit to sidelying: Supervision General bed mobility comments: Pt received exiting bathroom without staff present.   Transfers Overall transfer level: Modified independent Equipment used: None Transfers: Sit to/from Stand Sit to Stand: Supervision         General transfer comment: Pt demonstrated proper hand placement on seated surface for safety.   Ambulation/Gait Ambulation/Gait assistance: Modified independent (Device/Increase time) Gait Distance (Feet): 250 Feet Assistive device: None Gait Pattern/deviations: Step-through pattern;Decreased stride length Gait  velocity: Decreased Gait velocity interpretation: <1.8 ft/sec, indicate of risk for recurrent falls General Gait Details: Guarded but generally steady and without overt LOB.    Stairs Stairs: Yes Stairs assistance: Supervision Stair Management: Two rails;Step to pattern;Forwards Number of Stairs: 3 General stair comments: Light supervision for safety.    Wheelchair Mobility    Modified Rankin (Stroke Patients Only)       Balance Overall balance assessment: Needs assistance Sitting-balance support: No upper extremity supported;Feet supported Sitting balance-Leahy Scale: Fair Sitting balance - Comments: no LOB with donning socks   Standing balance support: No upper extremity supported;During functional activity Standing balance-Leahy Scale: Fair                              Cognition Arousal/Alertness: Awake/alert Behavior During Therapy: WFL for tasks assessed/performed Overall Cognitive Status: Within Functional Limits for tasks assessed                                        Exercises      General Comments        Pertinent Vitals/Pain Pain Assessment: Faces Faces Pain Scale: Hurts little more Pain Location: back  Pain Descriptors / Indicators: Aching;Operative site guarding Pain Intervention(s): Monitored during session;Repositioned    Home Living Family/patient expects to be discharged to:: Private residence Living Arrangements: Spouse/significant other Available Help at Discharge: Family;Available 24 hours/day Type of Home: House Home Access: Stairs to enter Entrance Stairs-Rails: Right;Left;Can reach both Home Layout: One level Home Equipment: Shower seat - built in;Toilet riser;Walker - 2 wheels      Prior Function  Level of Independence: Independent      Comments: husband manages housekeeping, shopping and laundry   PT Goals (current goals can now be found in the care plan section) Acute Rehab PT Goals Patient Stated  Goal: to go home PT Goal Formulation: With patient Time For Goal Achievement: 07/18/19 Potential to Achieve Goals: Good Progress towards PT goals: Progressing toward goals    Frequency    Min 5X/week      PT Plan Current plan remains appropriate    Co-evaluation              AM-PAC PT "6 Clicks" Mobility   Outcome Measure  Help needed turning from your back to your side while in a flat bed without using bedrails?: A Little Help needed moving from lying on your back to sitting on the side of a flat bed without using bedrails?: A Little Help needed moving to and from a bed to a chair (including a wheelchair)?: A Little Help needed standing up from a chair using your arms (e.g., wheelchair or bedside chair)?: A Little Help needed to walk in hospital room?: A Little Help needed climbing 3-5 steps with a railing? : A Little 6 Click Score: 18    End of Session Equipment Utilized During Treatment: Gait belt Activity Tolerance: Patient tolerated treatment well Patient left: in bed;with call bell/phone within reach;with family/visitor present Nurse Communication: Mobility status PT Visit Diagnosis: Other abnormalities of gait and mobility (R26.89);Pain Pain - part of body: (back)     Time: 0375-4360 PT Time Calculation (min) (ACUTE ONLY): 12 min  Charges:  $Gait Training: 8-22 mins                     Rolinda Roan, PT, DPT Acute Rehabilitation Services Pager: [phone] Office: [phone]    Thelma Comp 07/05/2019, 10:13 AM

## 2019-07-05 NOTE — Discharge Instructions (Signed)

## 2019-07-05 NOTE — Progress Notes (Signed)
All education completed with pt verbalizing and/or demonstrating understanding. No further OT needs.  07/05/19 0900  OT Visit Information  Last OT Received On 07/05/19  Assistance Needed +1  History of Present Illness Pt is a 63 y/o female s/p L4-5 PLIF. PMH includes R breast cancer, HTN, tremor, anxiety, and L TKA.   Precautions  Precautions Back  Precaution Booklet Issued Yes (comment)  Precaution Comments Reviewed back precautions with pt.   Required Braces or Orthoses Spinal Brace  Spinal Brace Lumbar corset;Applied in sitting position  Restrictions  Weight Bearing Restrictions No  Home Living  Family/patient expects to be discharged to: Private residence  Living Arrangements Spouse/significant other  Available Help at Discharge Family;Available 24 hours/day  Type of Home House  Home Access Stairs to enter  Entrance Stairs-Number of Steps 3  Entrance Stairs-Rails Right;Left;Can reach both  Home Layout One level  Medical laboratory scientific officer seat - built in;Toilet riser;Walker - 2 wheels  Prior Function  Level of Independence Independent  Comments husband manages housekeeping, shopping and Merchandiser, retail No difficulties  Pain Assessment  Pain Assessment Faces  Faces Pain Scale 4  Pain Location back   Pain Descriptors / Indicators Aching;Operative site guarding  Pain Intervention(s) Monitored during session;Repositioned  Cognition  Arousal/Alertness Awake/alert  Behavior During Therapy WFL for tasks assessed/performed  Overall Cognitive Status Within Functional Limits for tasks assessed  Upper Extremity Assessment  Upper Extremity Assessment Overall WFL for tasks assessed  Lower Extremity Assessment  Lower Extremity Assessment Defer to PT evaluation  Cervical / Trunk Assessment  Cervical / Trunk Assessment Other exceptions  Cervical / Trunk Exceptions s/p PLIF  ADL  Overall ADL's  Modified  independent  General ADL Comments pt able to cross foot over opposite knee to wash/dress feet, educated in two cup method for tooth brushing and use of wash cloth instead of bending over sink, educated in use of long handled bath spong for back and toilet tongs to avoid twisting with pericare as needed  Vision- History  Patient Visual Report No change from baseline  Bed Mobility  Overal bed mobility Needs Assistance  Bed Mobility Sit to Sidelying;Rolling  Rolling Supervision  Sit to sidelying Supervision  General bed mobility comments Supervision for safety. Heavy use of bed rails. Cues for sequencing when using log roll technique.   Transfers  Overall transfer level Needs assistance  Equipment used None  Transfers Sit to/from Stand  Sit to Stand Supervision  General transfer comment increased time, guarded  Balance  Overall balance assessment Needs assistance  Sitting-balance support No upper extremity supported;Feet supported  Sitting balance-Leahy Scale Good  Sitting balance - Comments no LOB with donning socks  Standing balance support No upper extremity supported;During functional activity  Standing balance-Leahy Scale Fair  OT - End of Session  Equipment Utilized During Treatment Gait belt;Back brace  Activity Tolerance Patient tolerated treatment well  Patient left in bed;with call bell/phone within reach  OT Assessment  OT Recommendation/Assessment Patient does not need any further OT services  OT Visit Diagnosis Pain;Other abnormalities of gait and mobility (R26.89)  AM-PAC OT "6 Clicks" Daily Activity Outcome Measure (Version 2)  Help from another person eating meals? 4  Help from another person taking care of personal grooming? 4  Help from another person toileting, which includes using toliet, bedpan, or urinal? 4  Help from another person bathing (including washing, rinsing, drying)? 4  Help from another person  to put on and taking off regular upper body clothing? 4   Help from another person to put on and taking off regular lower body clothing? 4  6 Click Score 24  OT Recommendation  Follow Up Recommendations No OT follow up  OT Equipment None recommended by OT  Acute Rehab OT Goals  Patient Stated Goal to go home  OT Time Calculation  OT Start Time (ACUTE ONLY) 0840  OT Stop Time (ACUTE ONLY) 0854  OT Time Calculation (min) 14 min  OT General Charges  $OT Visit 1 Visit  OT Evaluation  $OT Eval Low Complexity 1 Low  Written Expression  Dominant Hand Right  Nestor Lewandowsky, OTR/L Acute Rehabilitation Services Pager: [phone] Office: [phone]

## 2019-07-05 NOTE — Discharge Summary (Signed)
Physician Discharge Summary  Patient ID: Kathryn [name] MRN: [id number] DOB/AGE: 1956/05/18 63 y.o.  Admit date: 07/04/2019 Discharge date: 07/05/2019  Admission Diagnoses: Lumbar spondylolisthesis L4-L5 with radiculopathy lumbar stenosis  Discharge Diagnoses: Lumbar spondylolisthesis L4-L5.  Lumbar radiculopathy.  Lumbar stenosis. Active Problems:   Spondylolisthesis of lumbar region   Spondylolisthesis at L4-L5 level   Discharged Condition: good  Hospital Course: Patient was admitted to undergo surgical decompression and stabilization which he tolerated well.  Consults: None  Significant Diagnostic Studies: None  Treatments: surgery: Posterior decompression and fusion L4-L5 with posterior lumbar interbody arthrodesis.  Discharge Exam: Blood pressure 127/67, pulse 75, temperature 97.6 F (36.4 C), temperature source Oral, resp. rate 18, height 5\' 6"  (1.676 m), weight 101.6 kg, SpO2 100 %. Incision is clean and dry.  Station and gait are intact.  Disposition: Discharge disposition: 01-Home or Self Care       Discharge Instructions    Call MD for:  redness, tenderness, or signs of infection (pain, swelling, redness, odor or green/yellow discharge around incision site)   Complete by: As directed    Call MD for:  severe uncontrolled pain   Complete by: As directed    Call MD for:  temperature >100.4   Complete by: As directed    Diet - low sodium heart healthy   Complete by: As directed    Discharge instructions   Complete by: As directed    Okay to shower. Do not apply salves or appointments to incision. No heavy lifting with the upper extremities greater than 15 pounds. May resume driving when not requiring pain medication and patient feels comfortable with doing so.   Incentive spirometry RT   Complete by: As directed    Increase activity slowly   Complete by: As directed      Allergies as of 07/05/2019   No Known Allergies     Medication List    TAKE these  medications   acetaminophen 325 MG tablet Commonly known as: TYLENOL Take 650 mg by mouth every 6 (six) hours as needed for mild pain.   ALPRAZolam 0.25 MG tablet Commonly known as: XANAX Take 0.25 mg by mouth 2 (two) times daily as needed for anxiety or sleep.   DULoxetine 60 MG capsule Commonly known as: CYMBALTA Take 60 mg by mouth at bedtime.   levothyroxine 175 MCG tablet Commonly known as: SYNTHROID Take 175 mcg by mouth See admin instructions. Take 175 mcg by mouth daily before breakfast except Sunday   methocarbamol 500 MG tablet Commonly known as: ROBAXIN Take 1 tablet (500 mg total) by mouth every 6 (six) hours as needed for muscle spasms.   metoprolol succinate 50 MG 24 hr tablet Commonly known as: TOPROL-XL Take 50 mg by mouth daily. Take with or immediately following a meal.   MOTION SICKNESS PO Take 1 tablet by mouth daily with breakfast.   omeprazole 20 MG tablet Commonly known as: PRILOSEC OTC Take 20 mg by mouth daily.   oxyCODONE-acetaminophen 5-325 MG tablet Commonly known as: PERCOCET/ROXICET Take 1-2 tablets by mouth every 3 (three) hours as needed for moderate pain or severe pain.   tamoxifen 20 MG tablet Commonly known as: NOLVADEX TAKE 1 TABLET BY MOUTH DAILY   traMADol 50 MG tablet Commonly known as: ULTRAM Take 50 mg by mouth 2 (two) times daily as needed for moderate pain.        Signed: Earleen Newport 07/05/2019, 9:36 AM

## 2019-07-05 NOTE — Plan of Care (Signed)
Pt doing well. Pt and husband given D/C instructions with verbal understanding. Rx's were sent to pharmacy by MD. Pt's IV was removed prior to D/C. Pt's incision is clean and dry with no sign of infection. Pt D/C'd home via wheelchair per MD order. Pt is stable @ D/C and has no other needs at this time. Holli Humbles, RN

## 2019-07-20 DIAGNOSIS — M4316 Spondylolisthesis, lumbar region: Secondary | ICD-10-CM | POA: Diagnosis not present

## 2019-08-18 ENCOUNTER — Other Ambulatory Visit: Payer: Self-pay | Admitting: Oncology

## 2019-08-23 DIAGNOSIS — Z6836 Body mass index (BMI) 36.0-36.9, adult: Secondary | ICD-10-CM | POA: Diagnosis not present

## 2019-08-23 DIAGNOSIS — M4316 Spondylolisthesis, lumbar region: Secondary | ICD-10-CM | POA: Diagnosis not present

## 2019-10-02 ENCOUNTER — Other Ambulatory Visit: Payer: Self-pay | Admitting: Endocrinology

## 2019-10-02 DIAGNOSIS — Z1231 Encounter for screening mammogram for malignant neoplasm of breast: Secondary | ICD-10-CM

## 2019-10-12 DIAGNOSIS — D3500 Benign neoplasm of unspecified adrenal gland: Secondary | ICD-10-CM | POA: Diagnosis not present

## 2019-10-25 DIAGNOSIS — E039 Hypothyroidism, unspecified: Secondary | ICD-10-CM | POA: Diagnosis not present

## 2019-10-25 DIAGNOSIS — M5416 Radiculopathy, lumbar region: Secondary | ICD-10-CM | POA: Diagnosis not present

## 2019-10-25 DIAGNOSIS — I480 Paroxysmal atrial fibrillation: Secondary | ICD-10-CM | POA: Diagnosis not present

## 2019-10-25 DIAGNOSIS — E785 Hyperlipidemia, unspecified: Secondary | ICD-10-CM | POA: Diagnosis not present

## 2019-10-30 DIAGNOSIS — M5416 Radiculopathy, lumbar region: Secondary | ICD-10-CM | POA: Diagnosis not present

## 2019-10-31 DIAGNOSIS — M5126 Other intervertebral disc displacement, lumbar region: Secondary | ICD-10-CM | POA: Diagnosis not present

## 2019-10-31 DIAGNOSIS — M5416 Radiculopathy, lumbar region: Secondary | ICD-10-CM | POA: Diagnosis not present

## 2019-10-31 DIAGNOSIS — M48061 Spinal stenosis, lumbar region without neurogenic claudication: Secondary | ICD-10-CM | POA: Diagnosis not present

## 2019-11-01 DIAGNOSIS — M48062 Spinal stenosis, lumbar region with neurogenic claudication: Secondary | ICD-10-CM | POA: Diagnosis not present

## 2019-11-02 ENCOUNTER — Other Ambulatory Visit: Payer: Self-pay | Admitting: Neurological Surgery

## 2019-11-07 DIAGNOSIS — M5116 Intervertebral disc disorders with radiculopathy, lumbar region: Secondary | ICD-10-CM | POA: Diagnosis not present

## 2019-11-13 NOTE — Pre-Procedure Instructions (Signed)
Kathryn [name]  11/13/2019      [facility] - [place] - [address] [place] [address] [place] [address] Phone: [phone] Fax: [phone]    Your procedure is scheduled on November 21, 2019.  Report to [facility] Entrance "A" at 530 AM.  Call this number if you have problems the morning of surgery:  [phone]   Remember:  Do not eat or drink after midnight.    Take these medicines the morning of surgery with A SIP OF WATER  Tylenol-if needed Alprazolam (xanax)-if needed Levothyroxine (synthroid) Metoprolol Succinate (Toprol-XL) Omeprazole (prilosec) Oxycodone-acetaminophen (percocet)-if needed  7 days prior to surgery STOP taking any Aspirin (unless otherwise instructed by your surgeon), Aleve, Naproxen, Ibuprofen, Motrin, Advil, Goody's, BC's, all herbal medications, fish oil, and all vitamins    Day of surgery:  Do not wear jewelry, make-up or nail polish.  Do not wear lotions, powders, or perfumes, or deodorant.  Do not shave 48 hours prior to surgery.    Do not bring valuables to the hospital.  [facility] is not responsible for any belongings or valuables.  IF you are a smoker, DO NOT Smoke 24 hours prior to surgery   IF you wear a CPAP at night please bring your mask, tubing, and machine the morning of surgery    Remember that you must have someone to transport you home after your surgery, and remain with you for 24 hours if you are discharged the same day.  Contacts, dentures or bridgework may not be worn into surgery.  Leave your suitcase in the car.  After surgery it may be brought to your room.  For patients admitted to the hospital, discharge time will be determined by your treatment team.  Patients discharged the day of surgery will not be allowed to drive home.    [facility]- Preparing For Surgery  Before surgery, you can play an important role. Because skin is not sterile,  your skin needs to be as free of germs as possible. You can reduce the number of germs on your skin by washing with CHG (chlorahexidine gluconate) Soap before surgery.  CHG is an antiseptic cleaner which kills germs and bonds with the skin to continue killing germs even after washing.    Oral Hygiene is also important to reduce your risk of infection.  Remember - BRUSH YOUR TEETH THE MORNING OF SURGERY WITH YOUR REGULAR TOOTHPASTE  Please do not use if you have an allergy to CHG or antibacterial soaps. If your skin becomes reddened/irritated stop using the CHG.  Do not shave (including legs and underarms) for at least 48 hours prior to first CHG shower. It is OK to shave your face.  Please follow these instructions carefully.   1. Shower the NIGHT BEFORE SURGERY and the MORNING OF SURGERY with CHG.   2. If you chose to wash your hair, wash your hair first as usual with your normal shampoo.  3. After you shampoo, rinse your hair and body thoroughly to remove the shampoo.  4. Use CHG as you would any other liquid soap. You can apply CHG directly to the skin and wash gently with a scrungie or a clean washcloth.   5. Apply the CHG Soap to your body ONLY FROM THE NECK DOWN.  Do not use on open wounds or open sores. Avoid contact with your eyes, ears, mouth and genitals (private parts). Wash Face and genitals (private  parts)  with your normal soap.  6. Wash thoroughly, paying special attention to the area where your surgery will be performed.  7. Thoroughly rinse your body with warm water from the neck down.  8. DO NOT shower/wash with your normal soap after using and rinsing off the CHG Soap.  9. Pat yourself dry with a CLEAN TOWEL.  10. Wear CLEAN PAJAMAS to bed the night before surgery, wear comfortable clothes the morning of surgery  11. Place CLEAN SHEETS on your bed the night of your first shower and DO NOT SLEEP WITH PETS.  Day of Surgery: Shower as above Do not apply any  deodorants/lotions.  Please wear clean clothes to the hospital/surgery center.   Remember to brush your teeth WITH YOUR REGULAR TOOTHPASTE.   Please read over the following fact sheets that you were given.

## 2019-11-14 ENCOUNTER — Inpatient Hospital Stay (HOSPITAL_COMMUNITY)
Admission: RE | Admit: 2019-11-14 | Discharge: 2019-11-14 | Disposition: A | Discharge status: Home / Self Care | Payer: Medicare Other | Source: Ambulatory Visit

## 2019-11-14 DIAGNOSIS — M5416 Radiculopathy, lumbar region: Secondary | ICD-10-CM | POA: Diagnosis not present

## 2019-11-14 NOTE — Progress Notes (Addendum)
Called and spoke with patient.  She was unaware she had a preadmission appointment today.  I advised I would have our scheduler contact her to reschedule her PAT appointment.

## 2019-11-16 ENCOUNTER — Other Ambulatory Visit: Payer: Self-pay

## 2019-11-16 ENCOUNTER — Encounter (HOSPITAL_COMMUNITY): Payer: Self-pay

## 2019-11-16 ENCOUNTER — Encounter (HOSPITAL_COMMUNITY)
Admission: RE | Admit: 2019-11-16 | Discharge: 2019-11-16 | Disposition: A | Discharge status: Home / Self Care | Payer: Medicare Other | Source: Ambulatory Visit | Attending: Neurological Surgery | Admitting: Neurological Surgery

## 2019-11-16 DIAGNOSIS — Z01812 Encounter for preprocedural laboratory examination: Secondary | ICD-10-CM | POA: Insufficient documentation

## 2019-11-16 LAB — TYPE AND SCREEN
ABO/RH(D): A NEG
Antibody Screen: NEGATIVE

## 2019-11-16 LAB — CBC
HCT: 37.2 % (ref 36.0–46.0)
Hemoglobin: 11.6 g/dL — ABNORMAL LOW (ref 12.0–15.0)
MCH: 27.5 pg (ref 26.0–34.0)
MCHC: 31.2 g/dL (ref 30.0–36.0)
MCV: 88.2 fL (ref 80.0–100.0)
Platelets: 295 10*3/uL (ref 150–400)
RBC: 4.22 MIL/uL (ref 3.87–5.11)
RDW: 16.4 % — ABNORMAL HIGH (ref 11.5–15.5)
WBC: 7.5 10*3/uL (ref 4.0–10.5)
nRBC: 0 % (ref 0.0–0.2)

## 2019-11-16 LAB — BASIC METABOLIC PANEL
Anion gap: 12 (ref 5–15)
BUN: 18 mg/dL (ref 8–23)
CO2: 23 mmol/L (ref 22–32)
Calcium: 9 mg/dL (ref 8.9–10.3)
Chloride: 104 mmol/L (ref 98–111)
Creatinine, Ser: 1 mg/dL (ref 0.44–1.00)
GFR calc Af Amer: >60 mL/min (ref >60)
GFR calc non Af Amer: 59 mL/min — ABNORMAL LOW (ref >60)
Glucose, Bld: 110 mg/dL — ABNORMAL HIGH (ref 70–99)
Potassium: 3.6 mmol/L (ref 3.5–5.1)
Sodium: 139 mmol/L (ref 135–145)

## 2019-11-16 LAB — SURGICAL PCR SCREEN
MRSA, PCR: NEGATIVE
Staphylococcus aureus: POSITIVE — AB

## 2019-11-16 MED ORDER — CHLORHEXIDINE GLUCONATE CLOTH 2 % EX PADS: 6.0000 | MEDICATED_PAD | Freq: Once | CUTANEOUS | Status: DC

## 2019-11-16 NOTE — Progress Notes (Signed)
PCP - s  [employer] - na     Chest x-ray - na EKG - 8/20       Sleep Study - yes CPAP - no       Aspirin Instructions:stop     COVID TEST- for sat   Anesthesia review: review ekg  Patient denies shortness of breath, fever, cough and chest pain at PAT appointment   All instructions explained to the patient, with a verbal understanding of the material. Patient agrees to go over the instructions while at home for a better understanding. Patient also instructed to self quarantine after being tested for COVID-19. The opportunity to ask questions was provided.

## 2019-11-18 ENCOUNTER — Other Ambulatory Visit (HOSPITAL_COMMUNITY)
Admission: RE | Admit: 2019-11-18 | Discharge: 2019-11-18 | Disposition: A | Discharge status: Home / Self Care | Payer: Medicare Other | Source: Ambulatory Visit | Attending: Neurological Surgery | Admitting: Neurological Surgery

## 2019-11-18 DIAGNOSIS — E785 Hyperlipidemia, unspecified: Secondary | ICD-10-CM | POA: Diagnosis not present

## 2019-11-18 DIAGNOSIS — F329 Major depressive disorder, single episode, unspecified: Secondary | ICD-10-CM | POA: Diagnosis not present

## 2019-11-18 DIAGNOSIS — Z79890 Hormone replacement therapy (postmenopausal): Secondary | ICD-10-CM | POA: Diagnosis not present

## 2019-11-18 DIAGNOSIS — M4856XA Collapsed vertebra, not elsewhere classified, lumbar region, initial encounter for fracture: Secondary | ICD-10-CM | POA: Diagnosis not present

## 2019-11-18 DIAGNOSIS — K219 Gastro-esophageal reflux disease without esophagitis: Secondary | ICD-10-CM | POA: Diagnosis not present

## 2019-11-18 DIAGNOSIS — E039 Hypothyroidism, unspecified: Secondary | ICD-10-CM | POA: Diagnosis not present

## 2019-11-18 DIAGNOSIS — M4316 Spondylolisthesis, lumbar region: Secondary | ICD-10-CM | POA: Diagnosis not present

## 2019-11-18 DIAGNOSIS — Z01812 Encounter for preprocedural laboratory examination: Secondary | ICD-10-CM | POA: Insufficient documentation

## 2019-11-18 DIAGNOSIS — M48061 Spinal stenosis, lumbar region without neurogenic claudication: Secondary | ICD-10-CM | POA: Diagnosis not present

## 2019-11-18 DIAGNOSIS — K449 Diaphragmatic hernia without obstruction or gangrene: Secondary | ICD-10-CM | POA: Diagnosis not present

## 2019-11-18 DIAGNOSIS — F419 Anxiety disorder, unspecified: Secondary | ICD-10-CM | POA: Diagnosis not present

## 2019-11-18 DIAGNOSIS — Z981 Arthrodesis status: Secondary | ICD-10-CM | POA: Diagnosis not present

## 2019-11-18 DIAGNOSIS — M1990 Unspecified osteoarthritis, unspecified site: Secondary | ICD-10-CM | POA: Diagnosis present

## 2019-11-18 DIAGNOSIS — Z79899 Other long term (current) drug therapy: Secondary | ICD-10-CM | POA: Diagnosis not present

## 2019-11-18 DIAGNOSIS — M48062 Spinal stenosis, lumbar region with neurogenic claudication: Secondary | ICD-10-CM | POA: Diagnosis not present

## 2019-11-18 DIAGNOSIS — R251 Tremor, unspecified: Secondary | ICD-10-CM | POA: Diagnosis not present

## 2019-11-18 DIAGNOSIS — M5116 Intervertebral disc disorders with radiculopathy, lumbar region: Secondary | ICD-10-CM | POA: Diagnosis not present

## 2019-11-18 DIAGNOSIS — Z20828 Contact with and (suspected) exposure to other viral communicable diseases: Secondary | ICD-10-CM | POA: Diagnosis not present

## 2019-11-18 DIAGNOSIS — I10 Essential (primary) hypertension: Secondary | ICD-10-CM | POA: Diagnosis not present

## 2019-11-18 DIAGNOSIS — Z96651 Presence of right artificial knee joint: Secondary | ICD-10-CM | POA: Diagnosis not present

## 2019-11-18 LAB — SARS CORONAVIRUS 2 (TAT 6-24 HRS): SARS Coronavirus 2: NEGATIVE

## 2019-11-20 NOTE — Anesthesia Preprocedure Evaluation (Addendum)
Anesthesia Evaluation  Patient identified by MRN, date of birth, ID band Patient awake    Reviewed: Allergy & Precautions, H&P , NPO status , Patient's Chart, lab work & pertinent test results, reviewed documented beta blocker date and time   History of Anesthesia Complications (+) PONV and history of anesthetic complications  Airway Mallampati: III  TM Distance: >3 FB Neck ROM: Full    Dental no notable dental hx. (+) Dental Advisory Given, Missing, Chipped,    Pulmonary neg pulmonary ROS,    Pulmonary exam normal        Cardiovascular hypertension, Pt. on home beta blockers and Pt. on medications Normal cardiovascular exam     Neuro/Psych PSYCHIATRIC DISORDERS Anxiety Depression tremor    GI/Hepatic Neg liver ROS, hiatal hernia, GERD  Medicated and Controlled,  Endo/Other  Hypothyroidism   Renal/GU negative Renal ROS  negative genitourinary   Musculoskeletal  (+) Arthritis , Osteoarthritis,    Abdominal   Peds negative pediatric ROS (+)  Hematology negative hematology ROS (+)   Anesthesia Other Findings   Reproductive/Obstetrics negative OB ROS                             EKG: normal sinus rhythm.  Anesthesia Physical  Anesthesia Plan  ASA: II  Anesthesia Plan: General   Post-op Pain Management:    Induction: Intravenous  PONV Risk Score and Plan: 4 or greater and Ondansetron, Dexamethasone, Scopolamine patch - Pre-op and Diphenhydramine  Airway Management Planned: Oral ETT  Additional Equipment:   Intra-op Plan:   Post-operative Plan: Extubation in OR  Informed Consent: I have reviewed the patients History and Physical, chart, labs and discussed the procedure including the risks, benefits and alternatives for the proposed anesthesia with the patient or authorized representative who has indicated his/her understanding and acceptance.     Dental advisory  given  Plan Discussed with: CRNA, Anesthesiologist and Surgeon  Anesthesia Plan Comments:         Anesthesia Quick Evaluation

## 2019-11-21 ENCOUNTER — Inpatient Hospital Stay (HOSPITAL_COMMUNITY): Payer: Medicare Other | Admitting: Anesthesiology

## 2019-11-21 ENCOUNTER — Other Ambulatory Visit: Payer: Self-pay

## 2019-11-21 ENCOUNTER — Inpatient Hospital Stay (HOSPITAL_COMMUNITY): Payer: Medicare Other | Admitting: Physician Assistant

## 2019-11-21 ENCOUNTER — Encounter (HOSPITAL_COMMUNITY): Payer: Self-pay | Admitting: Neurological Surgery

## 2019-11-21 ENCOUNTER — Inpatient Hospital Stay (HOSPITAL_COMMUNITY)
Admission: RE | Admit: 2019-11-21 | Discharge: 2019-11-22 | DRG: 460 | Disposition: A | Discharge status: Home / Self Care | Payer: Medicare Other | Attending: Neurological Surgery | Admitting: Neurological Surgery

## 2019-11-21 ENCOUNTER — Inpatient Hospital Stay (HOSPITAL_COMMUNITY): Payer: Medicare Other

## 2019-11-21 ENCOUNTER — Encounter (HOSPITAL_COMMUNITY)
Admission: RE | Disposition: A | Discharge status: Home / Self Care | Payer: Self-pay | Source: Home / Self Care | Attending: Neurological Surgery

## 2019-11-21 DIAGNOSIS — K449 Diaphragmatic hernia without obstruction or gangrene: Secondary | ICD-10-CM | POA: Diagnosis present

## 2019-11-21 DIAGNOSIS — Z981 Arthrodesis status: Secondary | ICD-10-CM | POA: Diagnosis not present

## 2019-11-21 DIAGNOSIS — F419 Anxiety disorder, unspecified: Secondary | ICD-10-CM | POA: Diagnosis present

## 2019-11-21 DIAGNOSIS — M48061 Spinal stenosis, lumbar region without neurogenic claudication: Secondary | ICD-10-CM | POA: Diagnosis present

## 2019-11-21 DIAGNOSIS — M4856XA Collapsed vertebra, not elsewhere classified, lumbar region, initial encounter for fracture: Secondary | ICD-10-CM | POA: Diagnosis present

## 2019-11-21 DIAGNOSIS — M4316 Spondylolisthesis, lumbar region: Secondary | ICD-10-CM | POA: Diagnosis present

## 2019-11-21 DIAGNOSIS — I10 Essential (primary) hypertension: Secondary | ICD-10-CM | POA: Diagnosis present

## 2019-11-21 DIAGNOSIS — R251 Tremor, unspecified: Secondary | ICD-10-CM | POA: Diagnosis present

## 2019-11-21 DIAGNOSIS — Z20828 Contact with and (suspected) exposure to other viral communicable diseases: Secondary | ICD-10-CM | POA: Diagnosis present

## 2019-11-21 DIAGNOSIS — E039 Hypothyroidism, unspecified: Secondary | ICD-10-CM | POA: Diagnosis present

## 2019-11-21 DIAGNOSIS — Z79899 Other long term (current) drug therapy: Secondary | ICD-10-CM | POA: Diagnosis not present

## 2019-11-21 DIAGNOSIS — K219 Gastro-esophageal reflux disease without esophagitis: Secondary | ICD-10-CM | POA: Diagnosis present

## 2019-11-21 DIAGNOSIS — F329 Major depressive disorder, single episode, unspecified: Secondary | ICD-10-CM | POA: Diagnosis present

## 2019-11-21 DIAGNOSIS — M5116 Intervertebral disc disorders with radiculopathy, lumbar region: Principal | ICD-10-CM | POA: Diagnosis present

## 2019-11-21 DIAGNOSIS — M5416 Radiculopathy, lumbar region: Secondary | ICD-10-CM | POA: Diagnosis present

## 2019-11-21 DIAGNOSIS — Z419 Encounter for procedure for purposes other than remedying health state, unspecified: Secondary | ICD-10-CM

## 2019-11-21 DIAGNOSIS — Z96651 Presence of right artificial knee joint: Secondary | ICD-10-CM | POA: Diagnosis present

## 2019-11-21 DIAGNOSIS — M1990 Unspecified osteoarthritis, unspecified site: Secondary | ICD-10-CM | POA: Diagnosis present

## 2019-11-21 DIAGNOSIS — Z79890 Hormone replacement therapy (postmenopausal): Secondary | ICD-10-CM | POA: Diagnosis not present

## 2019-11-21 HISTORY — PX: ANTERIOR LAT LUMBAR FUSION: SHX1168

## 2019-11-21 SURGERY — ANTERIOR LATERAL LUMBAR FUSION 2 LEVELS
Anesthesia: General | Site: Spine Lumbar

## 2019-11-21 MED ORDER — SUCCINYLCHOLINE CHLORIDE 200 MG/10ML IV SOSY
PREFILLED_SYRINGE | INTRAVENOUS | Status: AC
  Filled 2019-11-21: qty 10

## 2019-11-21 MED ORDER — SODIUM CHLORIDE 0.9% FLUSH: 3.0000 mL | INTRAVENOUS | Status: DC | PRN

## 2019-11-21 MED ORDER — ACETAMINOPHEN 325 MG PO TABS: 650.0000 mg | ORAL_TABLET | ORAL | Status: DC | PRN

## 2019-11-21 MED ORDER — ACETAMINOPHEN 160 MG/5ML PO SOLN: 325.0000 mg | ORAL | Status: DC | PRN

## 2019-11-21 MED ORDER — BUPIVACAINE HCL (PF) 0.5 % IJ SOLN
INTRAMUSCULAR | Status: DC | PRN
  Administered 2019-11-21: 5 mL

## 2019-11-21 MED ORDER — FENTANYL CITRATE (PF) 100 MCG/2ML IJ SOLN
25.0000 ug | INTRAMUSCULAR | Status: DC | PRN
  Administered 2019-11-21 (×4): 50 ug via INTRAVENOUS

## 2019-11-21 MED ORDER — MEPERIDINE HCL 25 MG/ML IJ SOLN: 6.2500 mg | INTRAMUSCULAR | Status: DC | PRN

## 2019-11-21 MED ORDER — ONDANSETRON HCL 4 MG/2ML IJ SOLN: 4.0000 mg | Freq: Four times a day (QID) | INTRAMUSCULAR | Status: DC | PRN

## 2019-11-21 MED ORDER — KETAMINE HCL 50 MG/5ML IJ SOSY
PREFILLED_SYRINGE | INTRAMUSCULAR | Status: AC
  Filled 2019-11-21: qty 5

## 2019-11-21 MED ORDER — FLEET ENEMA 7-19 GM/118ML RE ENEM: 1.0000 | ENEMA | Freq: Once | RECTAL | Status: DC | PRN

## 2019-11-21 MED ORDER — FENTANYL CITRATE (PF) 250 MCG/5ML IJ SOLN
INTRAMUSCULAR | Status: AC
  Filled 2019-11-21: qty 5

## 2019-11-21 MED ORDER — THROMBIN 5000 UNITS EX SOLR
CUTANEOUS | Status: AC
  Filled 2019-11-21: qty 5000

## 2019-11-21 MED ORDER — SENNA 8.6 MG PO TABS
1.0000 | ORAL_TABLET | Freq: Two times a day (BID) | ORAL | Status: DC
  Administered 2019-11-21 – 2019-11-22 (×3): 8.6 mg via ORAL
  Filled 2019-11-21 (×3): qty 1

## 2019-11-21 MED ORDER — OXYCODONE HCL 5 MG PO TABS
ORAL_TABLET | ORAL | Status: AC
  Filled 2019-11-21: qty 1

## 2019-11-21 MED ORDER — FENTANYL CITRATE (PF) 100 MCG/2ML IJ SOLN
INTRAMUSCULAR | Status: AC
  Filled 2019-11-21: qty 2

## 2019-11-21 MED ORDER — METHOCARBAMOL 500 MG PO TABS
500.0000 mg | ORAL_TABLET | Freq: Four times a day (QID) | ORAL | Status: DC | PRN
  Administered 2019-11-21: 500 mg via ORAL
  Filled 2019-11-21 (×2): qty 1

## 2019-11-21 MED ORDER — OXYCODONE HCL 5 MG/5ML PO SOLN: 5.0000 mg | Freq: Once | ORAL | Status: AC | PRN

## 2019-11-21 MED ORDER — OXYCODONE HCL 5 MG PO TABS
5.0000 mg | ORAL_TABLET | Freq: Once | ORAL | Status: AC | PRN
  Administered 2019-11-21: 5 mg via ORAL

## 2019-11-21 MED ORDER — PHENYLEPHRINE 40 MCG/ML (10ML) SYRINGE FOR IV PUSH (FOR BLOOD PRESSURE SUPPORT)
PREFILLED_SYRINGE | INTRAVENOUS | Status: AC
  Filled 2019-11-21: qty 10

## 2019-11-21 MED ORDER — SODIUM CHLORIDE 0.9% FLUSH
3.0000 mL | Freq: Two times a day (BID) | INTRAVENOUS | Status: DC
  Administered 2019-11-21: 3 mL via INTRAVENOUS

## 2019-11-21 MED ORDER — KETOROLAC TROMETHAMINE 15 MG/ML IJ SOLN
7.5000 mg | Freq: Four times a day (QID) | INTRAMUSCULAR | Status: AC
  Administered 2019-11-21 – 2019-11-22 (×4): 7.5 mg via INTRAVENOUS
  Filled 2019-11-21 (×3): qty 1

## 2019-11-21 MED ORDER — LIDOCAINE-EPINEPHRINE 1 %-1:100000 IJ SOLN
INTRAMUSCULAR | Status: DC | PRN
  Administered 2019-11-21: 5 mL

## 2019-11-21 MED ORDER — ACETAMINOPHEN 325 MG PO TABS: 325.0000 mg | ORAL_TABLET | ORAL | Status: DC | PRN

## 2019-11-21 MED ORDER — ACETAMINOPHEN 10 MG/ML IV SOLN
INTRAVENOUS | Status: DC | PRN
  Administered 2019-11-21: 1000 mg via INTRAVENOUS

## 2019-11-21 MED ORDER — METOPROLOL SUCCINATE ER 50 MG PO TB24
50.0000 mg | ORAL_TABLET | Freq: Every day | ORAL | Status: DC
  Administered 2019-11-22: 50 mg via ORAL
  Filled 2019-11-21: qty 1

## 2019-11-21 MED ORDER — ONDANSETRON HCL 4 MG/2ML IJ SOLN
INTRAMUSCULAR | Status: AC
  Filled 2019-11-21: qty 2

## 2019-11-21 MED ORDER — LACTATED RINGERS IV SOLN: INTRAVENOUS | Status: DC | PRN

## 2019-11-21 MED ORDER — KETOROLAC TROMETHAMINE 15 MG/ML IJ SOLN
INTRAMUSCULAR | Status: AC
  Filled 2019-11-21: qty 1

## 2019-11-21 MED ORDER — BUPIVACAINE HCL (PF) 0.5 % IJ SOLN
INTRAMUSCULAR | Status: AC
  Filled 2019-11-21: qty 30

## 2019-11-21 MED ORDER — POLYETHYLENE GLYCOL 3350 17 G PO PACK: 17.0000 g | PACK | Freq: Every day | ORAL | Status: DC | PRN

## 2019-11-21 MED ORDER — PROPOFOL 10 MG/ML IV BOLUS
INTRAVENOUS | Status: AC
  Filled 2019-11-21: qty 20

## 2019-11-21 MED ORDER — PANTOPRAZOLE SODIUM 40 MG PO TBEC
40.0000 mg | DELAYED_RELEASE_TABLET | Freq: Every day | ORAL | Status: DC
  Administered 2019-11-21 – 2019-11-22 (×2): 40 mg via ORAL
  Filled 2019-11-21 (×2): qty 1

## 2019-11-21 MED ORDER — LACTATED RINGERS IV SOLN: INTRAVENOUS | Status: DC

## 2019-11-21 MED ORDER — LEVOTHYROXINE SODIUM 75 MCG PO TABS
175.0000 ug | ORAL_TABLET | ORAL | Status: DC
  Administered 2019-11-22: 175 ug via ORAL
  Filled 2019-11-21: qty 1

## 2019-11-21 MED ORDER — KETAMINE HCL 10 MG/ML IJ SOLN
INTRAMUSCULAR | Status: DC | PRN
  Administered 2019-11-21: 10 mg via INTRAVENOUS
  Administered 2019-11-21: 20 mg via INTRAVENOUS
  Administered 2019-11-21 (×2): 10 mg via INTRAVENOUS

## 2019-11-21 MED ORDER — SUCCINYLCHOLINE CHLORIDE 200 MG/10ML IV SOSY
PREFILLED_SYRINGE | INTRAVENOUS | Status: DC | PRN
  Administered 2019-11-21: 160 mg via INTRAVENOUS

## 2019-11-21 MED ORDER — METHOCARBAMOL 1000 MG/10ML IJ SOLN
500.0000 mg | Freq: Four times a day (QID) | INTRAVENOUS | Status: DC | PRN
  Filled 2019-11-21: qty 5

## 2019-11-21 MED ORDER — CEFAZOLIN SODIUM-DEXTROSE 2-4 GM/100ML-% IV SOLN
2.0000 g | INTRAVENOUS | Status: AC
  Administered 2019-11-21: 08:00:00 2 g via INTRAVENOUS
  Filled 2019-11-21: qty 100

## 2019-11-21 MED ORDER — CEFAZOLIN SODIUM-DEXTROSE 2-4 GM/100ML-% IV SOLN
2.0000 g | Freq: Three times a day (TID) | INTRAVENOUS | Status: AC
  Administered 2019-11-21 (×2): 2 g via INTRAVENOUS
  Filled 2019-11-21 (×2): qty 100

## 2019-11-21 MED ORDER — DULOXETINE HCL 30 MG PO CPEP
60.0000 mg | ORAL_CAPSULE | Freq: Every day | ORAL | Status: DC
  Administered 2019-11-21: 60 mg via ORAL
  Filled 2019-11-21: qty 2

## 2019-11-21 MED ORDER — PHENOL 1.4 % MT LIQD: 1.0000 | OROMUCOSAL | Status: DC | PRN

## 2019-11-21 MED ORDER — PROPOFOL 10 MG/ML IV BOLUS
INTRAVENOUS | Status: DC | PRN
  Administered 2019-11-21: 25 mg via INTRAVENOUS
  Administered 2019-11-21: 200 mg via INTRAVENOUS

## 2019-11-21 MED ORDER — METHOCARBAMOL 500 MG PO TABS
ORAL_TABLET | ORAL | Status: AC
  Filled 2019-11-21: qty 1

## 2019-11-21 MED ORDER — GLYCOPYRROLATE PF 0.2 MG/ML IJ SOSY
PREFILLED_SYRINGE | INTRAMUSCULAR | Status: DC | PRN
  Administered 2019-11-21: .2 mg via INTRAVENOUS

## 2019-11-21 MED ORDER — LIDOCAINE 2% (20 MG/ML) 5 ML SYRINGE
INTRAMUSCULAR | Status: AC
  Filled 2019-11-21: qty 5

## 2019-11-21 MED ORDER — FENTANYL CITRATE (PF) 250 MCG/5ML IJ SOLN
INTRAMUSCULAR | Status: DC | PRN
  Administered 2019-11-21 (×4): 50 ug via INTRAVENOUS
  Administered 2019-11-21: 100 ug via INTRAVENOUS

## 2019-11-21 MED ORDER — DEXAMETHASONE SODIUM PHOSPHATE 10 MG/ML IJ SOLN
INTRAMUSCULAR | Status: AC
  Filled 2019-11-21: qty 1

## 2019-11-21 MED ORDER — ACETAMINOPHEN 650 MG RE SUPP: 650.0000 mg | RECTAL | Status: DC | PRN

## 2019-11-21 MED ORDER — MIDAZOLAM HCL 2 MG/2ML IJ SOLN
INTRAMUSCULAR | Status: AC
  Filled 2019-11-21: qty 2

## 2019-11-21 MED ORDER — ONDANSETRON HCL 4 MG/2ML IJ SOLN
INTRAMUSCULAR | Status: DC | PRN
  Administered 2019-11-21: 4 mg via INTRAVENOUS

## 2019-11-21 MED ORDER — DEXAMETHASONE SODIUM PHOSPHATE 10 MG/ML IJ SOLN
INTRAMUSCULAR | Status: DC | PRN
  Administered 2019-11-21: 10 mg via INTRAVENOUS

## 2019-11-21 MED ORDER — OXYCODONE-ACETAMINOPHEN 5-325 MG PO TABS
1.0000 | ORAL_TABLET | ORAL | Status: DC | PRN
  Administered 2019-11-21 – 2019-11-22 (×4): 2 via ORAL
  Filled 2019-11-21 (×4): qty 2

## 2019-11-21 MED ORDER — ONDANSETRON HCL 4 MG PO TABS: 4.0000 mg | ORAL_TABLET | Freq: Four times a day (QID) | ORAL | Status: DC | PRN

## 2019-11-21 MED ORDER — ALBUMIN HUMAN 5 % IV SOLN: INTRAVENOUS | Status: DC | PRN

## 2019-11-21 MED ORDER — ALUM & MAG HYDROXIDE-SIMETH 200-200-20 MG/5ML PO SUSP: 30.0000 mL | Freq: Four times a day (QID) | ORAL | Status: DC | PRN

## 2019-11-21 MED ORDER — 0.9 % SODIUM CHLORIDE (POUR BTL) OPTIME
TOPICAL | Status: DC | PRN
  Administered 2019-11-21: 1000 mL

## 2019-11-21 MED ORDER — EPHEDRINE SULFATE-NACL 50-0.9 MG/10ML-% IV SOSY
PREFILLED_SYRINGE | INTRAVENOUS | Status: DC | PRN
  Administered 2019-11-21 (×2): 10 mg via INTRAVENOUS
  Administered 2019-11-21: 5 mg via INTRAVENOUS
  Administered 2019-11-21: 10 mg via INTRAVENOUS
  Administered 2019-11-21 (×3): 5 mg via INTRAVENOUS

## 2019-11-21 MED ORDER — ONDANSETRON HCL 4 MG/2ML IJ SOLN: 4.0000 mg | Freq: Once | INTRAMUSCULAR | Status: DC | PRN

## 2019-11-21 MED ORDER — BISACODYL 10 MG RE SUPP: 10.0000 mg | Freq: Every day | RECTAL | Status: DC | PRN

## 2019-11-21 MED ORDER — LIDOCAINE 2% (20 MG/ML) 5 ML SYRINGE
INTRAMUSCULAR | Status: DC | PRN
  Administered 2019-11-21: 80 mg via INTRAVENOUS

## 2019-11-21 MED ORDER — TAMOXIFEN CITRATE 10 MG PO TABS
20.0000 mg | ORAL_TABLET | Freq: Every day | ORAL | Status: DC
  Administered 2019-11-21: 20 mg via ORAL
  Filled 2019-11-21 (×2): qty 2

## 2019-11-21 MED ORDER — THROMBIN 5000 UNITS EX SOLR
OROMUCOSAL | Status: DC | PRN
  Administered 2019-11-21: 5 mL via TOPICAL

## 2019-11-21 MED ORDER — DOCUSATE SODIUM 100 MG PO CAPS
100.0000 mg | ORAL_CAPSULE | Freq: Two times a day (BID) | ORAL | Status: DC
  Administered 2019-11-21 – 2019-11-22 (×3): 100 mg via ORAL
  Filled 2019-11-21 (×3): qty 1

## 2019-11-21 MED ORDER — MIDAZOLAM HCL 5 MG/5ML IJ SOLN
INTRAMUSCULAR | Status: DC | PRN
  Administered 2019-11-21: 2 mg via INTRAVENOUS

## 2019-11-21 MED ORDER — PROPOFOL 500 MG/50ML IV EMUL
INTRAVENOUS | Status: DC | PRN
  Administered 2019-11-21: 50 ug/kg/min via INTRAVENOUS

## 2019-11-21 MED ORDER — MENTHOL 3 MG MT LOZG: 1.0000 | LOZENGE | OROMUCOSAL | Status: DC | PRN

## 2019-11-21 MED ORDER — LIDOCAINE-EPINEPHRINE 1 %-1:100000 IJ SOLN
INTRAMUSCULAR | Status: AC
  Filled 2019-11-21: qty 1

## 2019-11-21 MED ORDER — GLYCOPYRROLATE PF 0.2 MG/ML IJ SOSY
PREFILLED_SYRINGE | INTRAMUSCULAR | Status: AC
  Filled 2019-11-21: qty 1

## 2019-11-21 MED ORDER — MORPHINE SULFATE (PF) 2 MG/ML IV SOLN
2.0000 mg | INTRAVENOUS | Status: DC | PRN
  Administered 2019-11-21: 4 mg via INTRAVENOUS
  Filled 2019-11-21: qty 2

## 2019-11-21 MED ORDER — ROCURONIUM BROMIDE 10 MG/ML (PF) SYRINGE
PREFILLED_SYRINGE | INTRAVENOUS | Status: AC
  Filled 2019-11-21: qty 10

## 2019-11-21 MED ORDER — ACETAMINOPHEN 10 MG/ML IV SOLN
INTRAVENOUS | Status: AC
  Filled 2019-11-21: qty 100

## 2019-11-21 MED ORDER — ALPRAZOLAM 0.25 MG PO TABS: 0.2500 mg | ORAL_TABLET | Freq: Two times a day (BID) | ORAL | Status: DC | PRN

## 2019-11-21 MED ORDER — PHENYLEPHRINE HCL-NACL 10-0.9 MG/250ML-% IV SOLN
INTRAVENOUS | Status: DC | PRN
  Administered 2019-11-21: 15 ug/min via INTRAVENOUS

## 2019-11-21 MED ORDER — OMEPRAZOLE MAGNESIUM 20 MG PO TBEC: 20.0000 mg | DELAYED_RELEASE_TABLET | Freq: Every day | ORAL | Status: DC

## 2019-11-21 SURGICAL SUPPLY — 52 items
ADH SKN CLS APL DERMABOND .7 (GAUZE/BANDAGES/DRESSINGS) ×1
BLADE CLIPPER SURG (BLADE) IMPLANT
BOLT SPNL LRG 45X5.5XPLAT NS (Screw) IMPLANT
BONE MATRIX OSTEOCEL PRO LRG (Bone Implant) ×1 IMPLANT
BONE MATRIX OSTEOCEL PRO MED (Bone Implant) ×1 IMPLANT
COVER WAND RF STERILE (DRAPES) ×2 IMPLANT
DERMABOND ADVANCED (GAUZE/BANDAGES/DRESSINGS) ×1
DERMABOND ADVANCED .7 DNX12 (GAUZE/BANDAGES/DRESSINGS) ×1 IMPLANT
DRAPE C-ARM 42X72 X-RAY (DRAPES) ×2 IMPLANT
DRAPE C-ARMOR (DRAPES) ×2 IMPLANT
DRAPE LAPAROTOMY 100X72X124 (DRAPES) ×2 IMPLANT
DURAPREP 26ML APPLICATOR (WOUND CARE) ×2 IMPLANT
ELECT REM PT RETURN 9FT ADLT (ELECTROSURGICAL) ×2
ELECTRODE REM PT RTRN 9FT ADLT (ELECTROSURGICAL) ×1 IMPLANT
GAUZE 4X4 16PLY RFD (DISPOSABLE) IMPLANT
GLOVE BIOGEL PI IND STRL 7.0 (GLOVE) IMPLANT
GLOVE BIOGEL PI IND STRL 7.5 (GLOVE) IMPLANT
GLOVE BIOGEL PI IND STRL 8.5 (GLOVE) ×1 IMPLANT
GLOVE BIOGEL PI INDICATOR 7.0 (GLOVE) ×2
GLOVE BIOGEL PI INDICATOR 7.5 (GLOVE) ×2
GLOVE BIOGEL PI INDICATOR 8.5 (GLOVE) ×1
GLOVE ECLIPSE 8.5 STRL (GLOVE) ×2 IMPLANT
GLOVE SURG SS PI 7.0 STRL IVOR (GLOVE) ×4 IMPLANT
GOWN STRL REUS W/ TWL LRG LVL3 (GOWN DISPOSABLE) IMPLANT
GOWN STRL REUS W/ TWL XL LVL3 (GOWN DISPOSABLE) ×1 IMPLANT
GOWN STRL REUS W/TWL 2XL LVL3 (GOWN DISPOSABLE) ×2 IMPLANT
GOWN STRL REUS W/TWL LRG LVL3 (GOWN DISPOSABLE)
GOWN STRL REUS W/TWL XL LVL3 (GOWN DISPOSABLE) ×2
HEMOSTAT POWDER KIT SURGIFOAM (HEMOSTASIS) ×1 IMPLANT
KIT BASIN OR (CUSTOM PROCEDURE TRAY) ×2 IMPLANT
KIT DILATOR XLIF 5 (KITS) ×1 IMPLANT
KIT SURGICAL ACCESS MAXCESS 4 (KITS) ×1 IMPLANT
KIT TURNOVER KIT B (KITS) ×2 IMPLANT
MODULE NVM5 NEXT GEN EMG (NEEDLE) ×1 IMPLANT
MODULUS XLW 10X22X45MM 10 DEG (Cage) ×1 IMPLANT
MODULUS XLW 12X22X50MM 10DEG (Spine Construct) ×1 IMPLANT
NDL HYPO 25X1 1.5 SAFETY (NEEDLE) ×1 IMPLANT
NEEDLE HYPO 25X1 1.5 SAFETY (NEEDLE) ×2 IMPLANT
NS IRRIG 1000ML POUR BTL (IV SOLUTION) ×2 IMPLANT
PACK LAMINECTOMY NEURO (CUSTOM PROCEDURE TRAY) ×2 IMPLANT
PLATE 2H 10MM (Plate) ×1 IMPLANT
PLATE DECADE XLIP 2H SZ12 (Plate) ×1 IMPLANT
SCREW 45MM (Screw) ×8 IMPLANT
SPONGE LAP 4X18 RFD (DISPOSABLE) IMPLANT
SUT VIC AB 2-0 CP2 18 (SUTURE) ×2 IMPLANT
SUT VIC AB 3-0 SH 8-18 (SUTURE) ×2 IMPLANT
SUT VIC AB 4-0 RB1 18 (SUTURE) ×1 IMPLANT
TAPE CLOTH 4X10 WHT NS (GAUZE/BANDAGES/DRESSINGS) ×2 IMPLANT
TOWEL GREEN STERILE (TOWEL DISPOSABLE) ×2 IMPLANT
TOWEL GREEN STERILE FF (TOWEL DISPOSABLE) ×2 IMPLANT
TRAY FOLEY MTR SLVR 16FR STAT (SET/KITS/TRAYS/PACK) ×2 IMPLANT
WATER STERILE IRR 1000ML POUR (IV SOLUTION) ×2 IMPLANT

## 2019-11-21 NOTE — Progress Notes (Signed)
Orthopedic Tech Progress Note Patient Details:  Kathryn [name] [birth year] [id number] RN said patient has brace Patient ID: Jeffie Pollock, female   DOB: [birth year], 63 y.o.   MRN: [id number]   Janit Pagan 11/21/2019, 12:37 PM

## 2019-11-21 NOTE — Anesthesia Procedure Notes (Signed)
Procedure Name: Intubation Date/Time: 11/21/2019 7:45 AM Performed by: Renato Shin, CRNA Pre-anesthesia Checklist: Patient identified, Emergency Drugs available, Suction available and Patient being monitored Patient Re-evaluated:Patient Re-evaluated prior to induction Oxygen Delivery Method: Circle system utilized Preoxygenation: Pre-oxygenation with 100% oxygen Induction Type: IV induction Ventilation: Mask ventilation without difficulty Laryngoscope Size: Miller and 2 Grade View: Grade I Tube type: Oral Tube size: 7.0 mm Number of attempts: 1 Airway Equipment and Method: Stylet and Oral airway Placement Confirmation: ETT inserted through vocal cords under direct vision,  positive ETCO2 and breath sounds checked- equal and bilateral Secured at: 21 cm Tube secured with: Tape Dental Injury: Teeth and Oropharynx as per pre-operative assessment

## 2019-11-21 NOTE — Op Note (Signed)
Date of surgery: 11/21/2019 Preoperative diagnosis: Chronic left lumbar radiculopathy secondary to herniated nucleus pulposus L2-3 and L3-4.  History of decompression and fusion L4-5 for spondylolisthesis.  History of compression fracture of L1. Postoperative diagnosis: Same Procedure: Anterolateral decompression L2-3 and L3-4 using XLIF technique with lateral fixation allograft arthrodesis with ostia cell lateral plate fixation [id number] and L3-4, fluoroscopic guidance, intraoperative neural monitoring. Surgeon: Kristeen Miss Anesthesia: General endotracheal Indication: Kathryn [name] is a 63 year old individual who is had severe left-sided radicular pain now for several months time is been refractory to conservative care and she has known spondylitic disease at L2-3 and L3-4 which is worsened some by MRI and myelogram studies done over the last several months.  She has been advised regarding surgical decompression using an anterolateral technique for an indirect decompression on the left side at L2-3 and L3-4.  Procedure: Patient was brought to the operating room supine on the stretcher.  After the smooth induction of general endotracheal anesthesia she was carefully turned into the right lateral decubitus position.  Orthogonal positioning was checked with fluoroscopy and then the patient was taped into position and the bony prominences were appropriately secured and padded.  EMG monitoring electrodes were placed in the major groups of the lower extremities from L2 down to S1.  Then fluoroscopic guidance was used to mark an entry site on the left lateral aspect of the patient's skin into the L2-3 and L3-4 interspaces.  A common incision was made for this purpose.  After prepping with alcohol DuraPrep and draping in a sterile fashion incision was made in the lateral aspect of the chosen area and a posterior flank incision was also made to admit a finger into the retroperitoneal space.  Then once identifying the  retroperitoneal space digitally a second probe was passed through the lateral incision below the 12th rib to enter into the L3-4 interspace.  EMG monitoring was performed to make sure no elements of the lumbar plexus were nearby and when verified a K wire was passed into the lateral aspect of the disc space.  A series of dilators was then passed over this area again monitoring all the time to make sure no elements of the lumbar plexus were involved 160 mm retractor was then placed over this and dilated initially to allow visualization of the lateral aspect of the disc space posteriorly and perform some neural stimulation to make sure that no elements of the lumbar plexus were placed nearby a shim was then placed into the lateral aspect of the disc space.  The retractor was secured to the operating table with a clamp.  The interspace was then cleared of some soft tissues and entered with a #15 blade and a combination of curettes and rongeurs was used to evacuate a substantial quantity of severely degenerated desiccated disc material.  The contralateral portion of the annular ligament was opened with a large Cobb elevator in both directions and then a further series of dilators was used to increase the size of the space and free up degenerated disc material and endplate articular cartilage from either endplate at L3 and L4.  Ultimately the space was dilated to 12 mm height and was felt that a 12 mm tall 22 mm AP dimension spacer with 10 degrees of lordosis measuring 50 mGy meters in width would fit best into this interval this was filled with osteocell.  It was placed into the interspace under direct visualization again checking EMG monitoring to make sure no hyperactivity was noted  once the spacer was placed with the bone graft in place a lateral plate was affixed this was a 12 mm tall lateral plate with a 50 mm screw inferiorly and a 45 mm screws superiorly and L4 and L3 respectively.  Once this was secured final  radiographs were obtained in AP and lateral projection and attention was turned to L2-3.  At L2-3 a similar process was carried out and shim was placed in the interspace again using EMG monitoring all the time the lateral aspect of the disc space was cleared and entered with a 15 blade and a combination of curettes and rongeurs was used to remove substantial quantities of degenerated disc material in the end of the spacer was placed here also this 1 measured 10 mm in height 22 mm in AP dimension and 45 mm in width across the disc space.  It was filled with ostia cell and ostia cell was placed into the interval along the vertebral Burrell ends to make sure there was adequate allograft all around the spacer.  The spacer was then placed under fluoroscopic guidance and countersunk slightly.  A 10 mm tall lateral plate was then fitted over this and secured with 245 mm long screws.  These were all tightened in a neutral construct again EMG monitoring was performed during the entirety of this procedure and no hyperactivity was noted.  With this the retractors were removed and final radiographs were obtained in AP and lateral projection when verified hemostasis was checked and the wounds the retractor was removed and the fascia was closed with 2-0 Vicryl in interrupted fashion then 3-0 and 4-0 Vicryl was used in the subcuticular tissues.  Dermabond was placed on the skin.  Blood loss was estimated at less than 100 cc.  Patient was returned to recovery room in stable condition.

## 2019-11-21 NOTE — H&P (Addendum)
Kathryn [name] is an 63 y.o. female.   Chief Complaint: Back and left leg pain history of spondylolisthesis status post fusion of lumbar spine HPI: Kathryn [name] is a 63 year old individual who has had significant left lower extremity pain developed over the last several months time.  Has become increasingly refractory to conservative treatment and recent epidural injection did nothing to alleviate the pain and she has been in severe distress with back and left leg pain for the past 2 months time.  She has a known spondylolisthesis that underwent surgical decompression and fusion a little over a year ago.  She recovered well from that but soon developed some increasing back pain off to the left side worsened she had adjacent level spondylosis with significant lateral recess stenosis and will extraforaminal bulge of the disc at L3-4 and also some moderate degenerative changes at L2-3 the changes at L2-3 seems to have worsened in the process at L3-4 is stable by most recent study however given the nature of the stenosis on the left side it is likely that both levels are contributing to her ongoing back left lower extremity pain that radiates down the anterior border of the thigh and has significant weakness in the quadriceps muscle on the left side.  After careful consideration of her options I discussed doing an anterolateral decompression on the left side to decompress the L2-3 and L3-4 levels.  Lateral plate fixation will be employed.  Past Medical History:  Diagnosis Date  . Anxiety   . Cancer ([place]) 10/2016   Breast right  . Depression   . Elevated cholesterol   . GERD (gastroesophageal reflux disease)   . H/O hiatal hernia   . Hypertension   . Hypothyroidism   . PONV (postoperative nausea and vomiting)    felt cut with one of past knee surgeries  . Tremor    patient states "tremor has gotten better" on 06/30/19  . Wears partial dentures    upper    Past Surgical History:  Procedure  Laterality Date  . ABDOMINAL HYSTERECTOMY  yrs ago  . BREAST EXCISIONAL BIOPSY Right 11/2016   ALH and papillomas  . BREAST LUMPECTOMY WITH RADIOACTIVE SEED LOCALIZATION Right 12/15/2016   Procedure: RADIOACTIVE SEED X 2 GUIDED EXCISIONAL BREAST BIOPSY;  Surgeon: Rolm Bookbinder, MD;  Location: [facility];  Service: General;  Laterality: Right;  . CARPAL TUNNEL RELEASE Bilateral yrs ago   x 2  . cervical neck fusion  yrs ago   C 3 and C 4   . COLONOSCOPY    . left knee arthroscopy   last done 2008   x 3  . left shoulder arthroscopy  yrs ago  . lower back surgery  2005  . right total knee replacement   2008  . TOTAL KNEE ARTHROPLASTY  01/14/2012   Procedure: TOTAL KNEE ARTHROPLASTY;  Surgeon: Johnn Hai, MD;  Location: [facility];  Service: Orthopedics;  Laterality: Left;  preop femoral nerve block  . UPPER GI ENDOSCOPY    . WISDOM TOOTH EXTRACTION      History reviewed. No pertinent family history. Social History:  reports that she has never smoked. She has never used smokeless tobacco. She reports current alcohol use. She reports that she does not use drugs.  Allergies: No Known Allergies  Medications Prior to Admission  Medication Sig Dispense Refill  . acetaminophen (TYLENOL) 325 MG tablet Take 650 mg by mouth every 6 (six) hours as needed for mild pain.     Marland Kitchen  ALPRAZolam (XANAX) 0.25 MG tablet Take 0.25 mg by mouth 2 (two) times daily as needed for sleep.     . calcium carbonate (OSCAL) 1500 (600 Ca) MG TABS tablet Take 600 mg of elemental calcium by mouth daily.    Marland Kitchen dimenhyDRINATE (MOTION SICKNESS PO) Take 1 tablet by mouth daily.     . DULoxetine (CYMBALTA) 60 MG capsule Take 60 mg by mouth at bedtime.    Marland Kitchen levothyroxine (SYNTHROID, LEVOTHROID) 175 MCG tablet Take 175 mcg by mouth daily. EXCEPTt : Sunday    . metoprolol succinate (TOPROL-XL) 50 MG 24 hr tablet Take 50 mg by mouth daily. Take with or immediately following a meal.    . omeprazole (PRILOSEC OTC)  20 MG tablet Take 20 mg by mouth daily.    Marland Kitchen oxyCODONE-acetaminophen (PERCOCET) 10-325 MG tablet Take 1 tablet by mouth every 6 (six) hours as needed for pain.     . tamoxifen (NOLVADEX) 20 MG tablet TAKE 1 TABLET BY MOUTH DAILY (Patient taking differently: Take 20 mg by mouth at bedtime. ) 90 tablet 12  . methocarbamol (ROBAXIN) 500 MG tablet Take 1 tablet (500 mg total) by mouth every 6 (six) hours as needed for muscle spasms. (Patient not taking: Reported on 11/08/2019) 40 tablet 3  . oxyCODONE-acetaminophen (PERCOCET/ROXICET) 5-325 MG tablet Take 1-2 tablets by mouth every 3 (three) hours as needed for moderate pain or severe pain. (Patient not taking: Reported on 11/08/2019) 30 tablet 0    No results found for this or any previous visit (from the past 48 hour(s)). No results found.  Review of Systems  Constitutional: Negative.   HENT: Negative.   Eyes: Negative.   Respiratory: Negative.   Cardiovascular: Negative.   Gastrointestinal: Negative.   Endocrine: Negative.   Genitourinary: Negative.   Musculoskeletal: Positive for back pain and gait problem.  Skin: Negative.   Hematological: Negative.   Psychiatric/Behavioral: Negative.     Blood pressure (!) 183/95, pulse 66, temperature 98.3 F (36.8 C), temperature source Oral, resp. rate 17, height 5\' 6"  (1.676 m), weight 101 kg, SpO2 96 %. Physical Exam  Constitutional: She is oriented to person, place, and time. She appears well-developed and well-nourished.  Morbid obesity  HENT:  Head: Normocephalic and atraumatic.  Eyes: Pupils are equal, round, and reactive to light. Conjunctivae and EOM are normal.  Cardiovascular: Normal rate and regular rhythm.  Respiratory: Effort normal and breath sounds normal.  GI: Soft. Bowel sounds are normal.  Musculoskeletal:     Cervical back: Normal range of motion and neck supple.     Comments: Positive straight leg raising at 15 degrees on the left lower extremity Patrick's maneuver is  negative bilaterally marked tenderness to palpation percussion lumbar spine  Neurological: She is alert and oriented to person, place, and time.  Absent deep tendon reflexes in the left patellae and Achilles straight leg raising is markedly positive on the left side at 15 degrees absent on the right side to 60 degrees Patrick's maneuver is negative bilaterally cranial nerve examination is within limits of normal  Skin: Skin is warm and dry.  Psychiatric: She has a normal mood and affect. Her behavior is normal. Judgment and thought content normal.     Assessment/Plan Spondylosis and stenosis at L2-3 and L3-4 with left lumbar radiculopathy status post decompression and fusion L4-L5.  Plan anterolateral decompression L2-3 and L3-4 with lateral plate fixation.  Earleen Newport, MD 11/21/2019, 7:36 AM

## 2019-11-21 NOTE — Transfer of Care (Signed)
Immediate Anesthesia Transfer of Care Note  Patient: Kathryn [name]  Procedure(s) Performed: Lumbar two-three Lumbar three-four Anterolateral lumbar interbody fusion with lateral plate (N/A Spine Lumbar)  Patient Location: PACU  Anesthesia Type:General  Level of Consciousness: awake, drowsy and patient cooperative  Airway & Oxygen Therapy: Patient Spontanous Breathing and Patient connected to face mask oxygen  Post-op Assessment: Report given to RN and Post -op Vital signs reviewed and stable  Post vital signs: Reviewed and stable  Last Vitals:  Vitals Value Taken Time  BP 106/82 11/21/19 1028  Temp    Pulse 66 11/21/19 1029  Resp 28 11/21/19 1029  SpO2 100 % 11/21/19 1029  Vitals shown include unvalidated device data.  Last Pain:  Vitals:   11/21/19 0622  TempSrc:   PainSc: 8       Patients Stated Pain Goal: 3 ([id number])  Complications: No apparent anesthesia complications

## 2019-11-21 NOTE — Anesthesia Postprocedure Evaluation (Signed)
Anesthesia Post Note  Patient: Kathryn [name]  Procedure(s) Performed: Lumbar two-three Lumbar three-four Anterolateral lumbar interbody fusion with lateral plate (N/A Spine Lumbar)     Patient location during evaluation: PACU Anesthesia Type: General Level of consciousness: awake and alert Pain management: pain level controlled Vital Signs Assessment: post-procedure vital signs reviewed and stable Respiratory status: spontaneous breathing, nonlabored ventilation, respiratory function stable and patient connected to nasal cannula oxygen Cardiovascular status: blood pressure returned to baseline and stable Postop Assessment: no apparent nausea or vomiting Anesthetic complications: no    Last Vitals:  Vitals:   11/21/19 1529 11/21/19 1529  BP: 129/69 129/69  Pulse: 67 67  Resp: 18 18  Temp: 36.8 C 36.8 C  SpO2: 97% 97%    Last Pain:  Vitals:   11/21/19 1620  TempSrc:   PainSc: 5    Pain Goal: Patients Stated Pain Goal: 3 (11/21/19 1530)                 Khori Rosevear

## 2019-11-22 ENCOUNTER — Encounter: Payer: Self-pay | Admitting: *Deleted

## 2019-11-22 MED ORDER — OXYCODONE-ACETAMINOPHEN 5-325 MG PO TABS: 1.0000 | ORAL_TABLET | ORAL | 0 refills | Status: DC | PRN

## 2019-11-22 MED ORDER — METHOCARBAMOL 500 MG PO TABS: 500.0000 mg | ORAL_TABLET | Freq: Four times a day (QID) | ORAL | 3 refills | Status: DC | PRN

## 2019-11-22 NOTE — Evaluation (Signed)
Physical Therapy Evaluation and Discharge  Patient Details Name: Kathryn [name] MRN: [id number] DOB: 05-05-1956 Today's Date: 11/22/2019   History of Present Illness  Pt is a 63 y/o female with L LE pain, s/p anterolateral decompression and lateral plate fixation X33443, L3-4. PMH: anxiety, breast CA, depression, HTN.   Clinical Impression  Patient evaluated by Physical Therapy with no further acute PT needs identified. All education has been completed and the patient has no further questions. Pt was able to demonstrate transfers and ambulation with gross supervision for safety to modified independence with RW. Pt was educated on precautions, brace application/wearing schedule, appropriate activity progression, and car transfer. See below for any follow-up Physical Therapy or equipment needs. PT is signing off. Thank you for this referral.     Follow Up Recommendations No PT follow up;Supervision for mobility/OOB    Equipment Recommendations  None recommended by PT    Recommendations for Other Services       Precautions / Restrictions Precautions Precautions: Back Precaution Booklet Issued: Yes (comment) Precaution Comments: reviewed precautions with pt, min cueing to adhere  Required Braces or Orthoses: Spinal Brace Spinal Brace: Lumbar corset;Applied in sitting position Restrictions Weight Bearing Restrictions: No      Mobility  Bed Mobility Overal bed mobility: Needs Assistance Bed Mobility: Rolling;Sidelying to Sit;Sit to Sidelying Rolling: Modified independent (Device/Increase time) Sidelying to sit: Supervision     Sit to sidelying: Min assist General bed mobility comments: HOB flat and no use of rails to simulate home environment. Assist required for LE elevation up into bed at end of session.   Transfers Overall transfer level: Needs assistance Equipment used: Rolling walker (2 wheeled) Transfers: Sit to/from Stand Sit to Stand: Supervision;Modified independent  (Device/Increase time)         General transfer comment: Pt demonstrated proper hand placement on seated surface for safety. No assist required.   Ambulation/Gait Ambulation/Gait assistance: Modified independent (Device/Increase time) Gait Distance (Feet): 400 Feet Assistive device: Rolling walker (2 wheeled) Gait Pattern/deviations: Step-through pattern;Decreased stride length;Trunk flexed Gait velocity: Mildly decreased for age Gait velocity interpretation: 1.31 - 2.62 ft/sec, indicative of limited community ambulator General Gait Details: VC's for improved posture and closer walker proximity.   Stairs Stairs: (Pt declined stair training - reports she feels comfortable)          Wheelchair Mobility    Modified Rankin (Stroke Patients Only)       Balance Overall balance assessment: Needs assistance Sitting-balance support: No upper extremity supported;Feet supported Sitting balance-Leahy Scale: Fair     Standing balance support: Bilateral upper extremity supported;During functional activity;No upper extremity supported Standing balance-Leahy Scale: Fair Standing balance comment: relaint on BUE support dynamically.                             Pertinent Vitals/Pain Pain Assessment: Faces Faces Pain Scale: Hurts little more Pain Location: back, incisional Pain Descriptors / Indicators: Discomfort;Grimacing;Operative site guarding Pain Intervention(s): Limited activity within patient's tolerance;Monitored during session;Repositioned    Home Living Family/patient expects to be discharged to:: Private residence Living Arrangements: Spouse/significant other Available Help at Discharge: Family;Available 24 hours/day Type of Home: House Home Access: Stairs to enter Entrance Stairs-Rails: Can reach both Entrance Stairs-Number of Steps: 3 Home Layout: One level Home Equipment: Shower seat - built in;Toilet riser;Walker - 2 wheels;Cane - single point;Grab  bars - toilet;Grab bars - tub/shower      Prior Function Level of Independence: Needs  assistance   Gait / Transfers Assistance Needed: used cane for mobility, at times needing assist with transfers due to pain   ADL's / [place] Needed: assist for LB ADLs from spouse, otherwise independent; assist from spouse for IADLs        Hand Dominance   Dominant Hand: Right    Extremity/Trunk Assessment   Upper Extremity Assessment Upper Extremity Assessment: Defer to OT evaluation    Lower Extremity Assessment Lower Extremity Assessment: Overall WFL for tasks assessed    Cervical / Trunk Assessment Cervical / Trunk Assessment: Other exceptions Cervical / Trunk Exceptions: s/p back surgery  Communication   Communication: No difficulties  Cognition Arousal/Alertness: Awake/alert Behavior During Therapy: WFL for tasks assessed/performed Overall Cognitive Status: Within Functional Limits for tasks assessed                                        General Comments      Exercises     Assessment/Plan    PT Assessment Patent does not need any further PT services  PT Problem List         PT Treatment Interventions      PT Goals (Current goals can be found in the Care Plan section)  Acute Rehab PT Goals Patient Stated Goal: home today PT Goal Formulation: All assessment and education complete, DC therapy    Frequency     Barriers to discharge        Co-evaluation               AM-PAC PT "6 Clicks" Mobility  Outcome Measure Help needed turning from your back to your side while in a flat bed without using bedrails?: None Help needed moving from lying on your back to sitting on the side of a flat bed without using bedrails?: None Help needed moving to and from a bed to a chair (including a wheelchair)?: None Help needed standing up from a chair using your arms (e.g., wheelchair or bedside chair)?: None Help needed to walk in hospital  room?: None Help needed climbing 3-5 steps with a railing? : A Little 6 Click Score: 23    End of Session Equipment Utilized During Treatment: Gait belt;Back brace Activity Tolerance: Patient tolerated treatment well Patient left: in bed;with call bell/phone within reach;with family/visitor present Nurse Communication: Mobility status PT Visit Diagnosis: Unsteadiness on feet (R26.81);Pain Pain - part of body: (back/incision site)    Time: 0943-1000 PT Time Calculation (min) (ACUTE ONLY): 17 min   Charges:   PT Evaluation $PT Eval Low Complexity: 1 Low          Kathryn [name], PT, DPT Acute Rehabilitation Services Pager: [phone] Office: [phone]   Thelma Comp 11/22/2019, 12:02 PM

## 2019-11-22 NOTE — Plan of Care (Signed)
Pt and husband given D/C instructions with verbal understanding. Rx's were sent to pharmacy by MD. Pt's incision is clean and dry with no sign of infection. Pt's IV was removed prior to D/C. Pt D/C'd home via wheelchair per MD order. Pt is stable @ D/C and has no other needs at this time. Holli Humbles, RN

## 2019-11-22 NOTE — Evaluation (Addendum)
Occupational Therapy Evaluation and Discharge Patient Details Name: Kathryn [name] MRN: [id number] DOB: 06-12-1956 Today's Date: 11/22/2019    History of Present Illness Pt is a 63 y/o female with L LE pain, s/p anterolateral decompression and lateral plate fixation X33443, L3-4. PMH: anxiety, breast CA, depression, HTN.    Clinical Impression   PTA patient using cane for mobility, some assist with transfers due to pain and required assist for LB ADLs (spouse completed IADLS). Patient admitted for above and limited by problem list below, including back pain, precautions, decreased functional reach/ROM to LEs, and impaired balance. She currently requires min assist for UB ADLs, mod assist for LB ADLs, min guard assist for transfers and supervision for grooming.  Patient has 24/7 support from spouse, who was assisting PTA.  No further questions or concerns for dc home.  No further OT needs identified. OT will sign off.     Follow Up Recommendations  Supervision/Assistance - 24 hour;No OT follow up    Equipment Recommendations  None recommended by OT    Recommendations for Other Services       Precautions / Restrictions Precautions Precautions: Back Precaution Booklet Issued: Yes (comment) Precaution Comments: reviewed precautions with pt, min cueing to adhere  Required Braces or Orthoses: Spinal Brace Spinal Brace: Lumbar corset;Applied in sitting position Restrictions Weight Bearing Restrictions: No      Mobility Bed Mobility Overal bed mobility: Needs Assistance Bed Mobility: Rolling;Sidelying to Sit Rolling: Supervision Sidelying to sit: Supervision       General bed mobility comments: supervison for safety and log roll technique, increased time but no phyiscal assistance required  Transfers Overall transfer level: Needs assistance Equipment used: Rolling walker (2 wheeled) Transfers: Sit to/from Stand Sit to Stand: Min guard         General transfer comment: min  guard for balance, cueing for hand placement and safety    Balance Overall balance assessment: Needs assistance Sitting-balance support: No upper extremity supported;Feet supported Sitting balance-Leahy Scale: Fair     Standing balance support: Bilateral upper extremity supported;During functional activity;No upper extremity supported Standing balance-Leahy Scale: Fair Standing balance comment: relaint on BUE support dynamically, able to complete ADLs statically with supervision                            ADL either performed or assessed with clinical judgement   ADL Overall ADL's : Needs assistance/impaired     Grooming: Supervision/safety;Standing   Upper Body Bathing: Set up;Sitting   Lower Body Bathing: Moderate assistance;Sitting/lateral leans   Upper Body Dressing : Minimal assistance;Sitting Upper Body Dressing Details (indicate cue type and reason): for brace mgmt  Lower Body Dressing: Moderate assistance;Sit to/from stand;Cueing for compensatory techniques;Cueing for back precautions Lower Body Dressing Details (indicate cue type and reason): pt unable to complete figure 4 technique, requires assist to thread pants, don socks and plans to have spouse assist at [employer] Transfer: Min guard;Ambulation;RW Toilet Transfer Details (indicate cue type and reason): cueing for hand placement and safety     Tub/ Shower Transfer: Min guard;Walk-in shower;Shower seat;Ambulation;Rolling walker;Grab bars Tub/Shower Transfer Details (indicate cue type and reason): simulated on techniques for safety and back precuautions, using grabbars Functional mobility during ADLs: Min guard;Supervision/safety;Rolling walker General ADL Comments: pt educated on back precautions, brace mgmt and safety, ADL compensatory techniques and safety     Vision   Vision Assessment?: No apparent visual deficits     Perception  Praxis      Pertinent Vitals/Pain Pain Assessment:  Faces Faces Pain Scale: Hurts little more Pain Location: back, incisional Pain Descriptors / Indicators: Discomfort;Grimacing;Operative site guarding Pain Intervention(s): Monitored during session;Repositioned;Limited activity within patient's tolerance     Hand Dominance Right   Extremity/Trunk Assessment Upper Extremity Assessment Upper Extremity Assessment: Overall WFL for tasks assessed   Lower Extremity Assessment Lower Extremity Assessment: Defer to PT evaluation   Cervical / Trunk Assessment Cervical / Trunk Assessment: Other exceptions Cervical / Trunk Exceptions: s/p back surgery   Communication Communication Communication: No difficulties   Cognition Arousal/Alertness: Awake/alert Behavior During Therapy: WFL for tasks assessed/performed Overall Cognitive Status: Within Functional Limits for tasks assessed                                     General Comments  spouse present and supportive    Exercises     Shoulder Instructions      Home Living Family/patient expects to be discharged to:: Private residence Living Arrangements: Spouse/significant other Available Help at Discharge: Family;Available 24 hours/day Type of Home: House Home Access: Stairs to enter [employer] of Steps: 3 Entrance Stairs-Rails: Can reach both Home Layout: One level     Bathroom Shower/Tub: Occupational psychologist: Standard     Home Equipment: Shower seat - built in;Toilet riser;Walker - 2 wheels;Cane - single point;Grab bars - toilet;Grab bars - tub/shower          Prior Functioning/Environment Level of Independence: Needs assistance  Gait / Transfers Assistance Needed: used cane for mobility, at times needing assist with transfers due to pain  ADL's / Homemaking Assistance Needed: assist for LB ADLs from spouse, otherwise independent; assist from spouse for IADLs            OT Problem List: Decreased activity tolerance;Impaired  balance (sitting and/or standing);Decreased safety awareness;Decreased knowledge of use of DME or AE;Decreased knowledge of precautions;Pain      OT Treatment/Interventions:      OT Goals(Current goals can be found in the care plan section) Acute Rehab OT Goals Patient Stated Goal: home today OT Goal Formulation: With patient Time For Goal Achievement: 12/06/19 Potential to Achieve Goals: Good  OT Frequency:     Barriers to D/C:            Co-evaluation              AM-PAC OT "6 Clicks" Daily Activity     Outcome Measure Help from another person eating meals?: None Help from another person taking care of personal grooming?: A Little Help from another person toileting, which includes using toliet, bedpan, or urinal?: A Little Help from another person bathing (including washing, rinsing, drying)?: A Lot Help from another person to put on and taking off regular upper body clothing?: A Little Help from another person to put on and taking off regular lower body clothing?: A Lot 6 Click Score: 17   End of Session Equipment Utilized During Treatment: Rolling walker;Back brace Nurse Communication: Mobility status  Activity Tolerance: Patient tolerated treatment well Patient left: with call bell/phone within reach;Other (comment)(seated EOB )  OT Visit Diagnosis: Other abnormalities of gait and mobility (R26.89);Pain Pain - part of body: (back, incisonal)                Time: [id number] OT Time Calculation (min): 19 min Charges:  OT General Charges $OT Visit:  1 Visit OT Evaluation $OT Eval Low Complexity: 1 Low  Jolaine Artist, OT Acute Rehabilitation Services Pager [phone] Office [phone]   Delight Stare 11/22/2019, 9:27 AM

## 2019-11-22 NOTE — Discharge Summary (Signed)
Physician Discharge Summary  Patient ID: Kathryn [name] MRN: [id number] DOB/AGE: 03-13-1956 63 y.o.  Admit date: 11/21/2019 Discharge date: 11/22/2019  Admission Diagnoses: Spondylosis and stenosis with left lumbar radiculopathy L2-3 L3-4, chronic.  History of fusion L4-L5.  History of compression fracture L1.  Discharge Diagnoses: Spondylosis and stenosis with left lumbar radiculopathy L2-3 and L3-4, chronic.  History of fusion L4-L5.  History of compression fracture L1. Active Problems:   Lumbar radiculopathy, chronic   Discharged Condition: good  Hospital Course: Patient was admitted to undergo surgical decompression and stabilization via an anterolateral approach.  Postoperatively she noted a modest improvement in her left anterior thigh pain but over the next 18 hours she notes that the pain has improved further.  She is ambulatory.  Her incisions are clean and dry there is a moderate amount of ecchymosis but overall her ambulatory status is improved.  Consults: None  Significant Diagnostic Studies: None  Treatments: surgery: Anterolateral decompression L2-3 and L3-4 with arthrodesis using XLIF technique  Discharge Exam: Blood pressure (!) 177/80, pulse 68, temperature 98.4 F (36.9 C), temperature source Oral, resp. rate 18, height 5\' 6"  (1.676 m), weight 101 kg, SpO2 99 %. Incisions are clean and dry moderate ecchymosis in the beds of the incisions motor function appears good in iliopsoas quad tibialis anterior and gastrocs Station and gait are intact.  Disposition: Discharge disposition: 01-Home or Self Care       Discharge Instructions    Call MD for:  redness, tenderness, or signs of infection (pain, swelling, redness, odor or green/yellow discharge around incision site)   Complete by: As directed    Call MD for:  severe uncontrolled pain   Complete by: As directed    Call MD for:  temperature >100.4   Complete by: As directed    Diet - low sodium heart healthy    Complete by: As directed    Discharge instructions   Complete by: As directed    Okay to shower. Do not apply salves or appointments to incision. No heavy lifting with the upper extremities greater than 15 pounds. May resume driving when not requiring pain medication and patient feels comfortable with doing so.   Incentive spirometry RT   Complete by: As directed    Increase activity slowly   Complete by: As directed      Allergies as of 11/22/2019   No Known Allergies     Medication List    STOP taking these medications   oxyCODONE-acetaminophen 10-325 MG tablet Commonly known as: PERCOCET Replaced by: oxyCODONE-acetaminophen 5-325 MG tablet You also have another medication with the same name that you need to continue taking as instructed.     TAKE these medications   acetaminophen 325 MG tablet Commonly known as: TYLENOL Take 650 mg by mouth every 6 (six) hours as needed for mild pain.   ALPRAZolam 0.25 MG tablet Commonly known as: XANAX Take 0.25 mg by mouth 2 (two) times daily as needed for sleep.   calcium carbonate 1500 (600 Ca) MG Tabs tablet Commonly known as: OSCAL Take 600 mg of elemental calcium by mouth daily.   DULoxetine 60 MG capsule Commonly known as: CYMBALTA Take 60 mg by mouth at bedtime.   levothyroxine 175 MCG tablet Commonly known as: SYNTHROID Take 175 mcg by mouth daily. EXCEPTt : Sunday   methocarbamol 500 MG tablet Commonly known as: ROBAXIN Take 1 tablet (500 mg total) by mouth every 6 (six) hours as needed for muscle spasms. What changed:  Another medication with the same name was added. Make sure you understand how and when to take each.   methocarbamol 500 MG tablet Commonly known as: ROBAXIN Take 1 tablet (500 mg total) by mouth every 6 (six) hours as needed for muscle spasms. What changed: You were already taking a medication with the same name, and this prescription was added. Make sure you understand how and when to take each.    metoprolol succinate 50 MG 24 hr tablet Commonly known as: TOPROL-XL Take 50 mg by mouth daily. Take with or immediately following a meal.   MOTION SICKNESS PO Take 1 tablet by mouth daily.   omeprazole 20 MG tablet Commonly known as: PRILOSEC OTC Take 20 mg by mouth daily.   oxyCODONE-acetaminophen 5-325 MG tablet Commonly known as: PERCOCET/ROXICET Take 1-2 tablets by mouth every 3 (three) hours as needed for moderate pain or severe pain. What changed:   Another medication with the same name was added. Make sure you understand how and when to take each.  Another medication with the same name was removed. Continue taking this medication, and follow the directions you see here.   oxyCODONE-acetaminophen 5-325 MG tablet Commonly known as: PERCOCET/ROXICET Take 1-2 tablets by mouth every 4 (four) hours as needed for moderate pain or severe pain. What changed: You were already taking a medication with the same name, and this prescription was added. Make sure you understand how and when to take each. Replaces: oxyCODONE-acetaminophen 10-325 MG tablet   tamoxifen 20 MG tablet Commonly known as: NOLVADEX TAKE 1 TABLET BY MOUTH DAILY What changed: when to take this        Signed: Earleen Newport 11/22/2019, 10:40 AM

## 2019-11-23 ENCOUNTER — Ambulatory Visit: Payer: Medicare Other

## 2019-12-13 DIAGNOSIS — M810 Age-related osteoporosis without current pathological fracture: Secondary | ICD-10-CM | POA: Diagnosis not present

## 2019-12-13 DIAGNOSIS — M48062 Spinal stenosis, lumbar region with neurogenic claudication: Secondary | ICD-10-CM | POA: Diagnosis not present

## 2019-12-21 DIAGNOSIS — Z981 Arthrodesis status: Secondary | ICD-10-CM | POA: Diagnosis not present

## 2019-12-26 ENCOUNTER — Other Ambulatory Visit: Payer: Self-pay | Admitting: Neurological Surgery

## 2019-12-26 DIAGNOSIS — M48062 Spinal stenosis, lumbar region with neurogenic claudication: Secondary | ICD-10-CM

## 2020-01-02 ENCOUNTER — Ambulatory Visit
Admission: RE | Admit: 2020-01-02 | Discharge: 2020-01-02 | Disposition: A | Discharge status: Home / Self Care | Payer: Medicare Other | Source: Ambulatory Visit | Attending: Neurological Surgery | Admitting: Neurological Surgery

## 2020-01-02 DIAGNOSIS — M549 Dorsalgia, unspecified: Secondary | ICD-10-CM | POA: Diagnosis not present

## 2020-01-02 DIAGNOSIS — M48062 Spinal stenosis, lumbar region with neurogenic claudication: Secondary | ICD-10-CM

## 2020-01-03 DIAGNOSIS — E7849 Other hyperlipidemia: Secondary | ICD-10-CM | POA: Diagnosis not present

## 2020-01-03 DIAGNOSIS — E538 Deficiency of other specified B group vitamins: Secondary | ICD-10-CM | POA: Diagnosis not present

## 2020-01-03 DIAGNOSIS — E038 Other specified hypothyroidism: Secondary | ICD-10-CM | POA: Diagnosis not present

## 2020-02-01 DIAGNOSIS — M810 Age-related osteoporosis without current pathological fracture: Secondary | ICD-10-CM | POA: Diagnosis not present

## 2020-02-01 DIAGNOSIS — K219 Gastro-esophageal reflux disease without esophagitis: Secondary | ICD-10-CM | POA: Diagnosis not present

## 2020-02-21 ENCOUNTER — Other Ambulatory Visit: Payer: Self-pay | Admitting: Neurological Surgery

## 2020-02-21 DIAGNOSIS — Z6836 Body mass index (BMI) 36.0-36.9, adult: Secondary | ICD-10-CM | POA: Diagnosis not present

## 2020-02-21 DIAGNOSIS — I10 Essential (primary) hypertension: Secondary | ICD-10-CM | POA: Diagnosis not present

## 2020-02-21 DIAGNOSIS — M5416 Radiculopathy, lumbar region: Secondary | ICD-10-CM | POA: Diagnosis not present

## 2020-02-21 DIAGNOSIS — M48062 Spinal stenosis, lumbar region with neurogenic claudication: Secondary | ICD-10-CM | POA: Diagnosis not present

## 2020-03-07 ENCOUNTER — Other Ambulatory Visit: Payer: Self-pay | Admitting: Neurological Surgery

## 2020-03-07 DIAGNOSIS — M48062 Spinal stenosis, lumbar region with neurogenic claudication: Secondary | ICD-10-CM

## 2020-03-11 ENCOUNTER — Ambulatory Visit
Admission: RE | Admit: 2020-03-11 | Discharge: 2020-03-11 | Disposition: A | Discharge status: Home / Self Care | Payer: Medicare Other | Source: Ambulatory Visit | Attending: Neurological Surgery | Admitting: Neurological Surgery

## 2020-03-11 DIAGNOSIS — M48062 Spinal stenosis, lumbar region with neurogenic claudication: Secondary | ICD-10-CM

## 2020-03-11 DIAGNOSIS — M545 Low back pain: Secondary | ICD-10-CM | POA: Diagnosis not present

## 2020-03-13 DIAGNOSIS — M5416 Radiculopathy, lumbar region: Secondary | ICD-10-CM | POA: Diagnosis not present

## 2020-03-19 DIAGNOSIS — M5137 Other intervertebral disc degeneration, lumbosacral region: Secondary | ICD-10-CM | POA: Diagnosis not present

## 2020-03-19 DIAGNOSIS — M5416 Radiculopathy, lumbar region: Secondary | ICD-10-CM | POA: Diagnosis not present

## 2020-03-19 DIAGNOSIS — M5417 Radiculopathy, lumbosacral region: Secondary | ICD-10-CM | POA: Diagnosis not present

## 2020-03-20 ENCOUNTER — Other Ambulatory Visit: Payer: Self-pay | Admitting: General Surgery

## 2020-03-20 DIAGNOSIS — Z1231 Encounter for screening mammogram for malignant neoplasm of breast: Secondary | ICD-10-CM

## 2020-03-21 ENCOUNTER — Other Ambulatory Visit: Payer: Self-pay

## 2020-03-21 ENCOUNTER — Ambulatory Visit
Admission: RE | Admit: 2020-03-21 | Discharge: 2020-03-21 | Disposition: A | Discharge status: Home / Self Care | Payer: Medicare Other | Source: Ambulatory Visit | Attending: General Surgery | Admitting: General Surgery

## 2020-03-21 DIAGNOSIS — Z1231 Encounter for screening mammogram for malignant neoplasm of breast: Secondary | ICD-10-CM | POA: Diagnosis not present

## 2020-04-24 DIAGNOSIS — M810 Age-related osteoporosis without current pathological fracture: Secondary | ICD-10-CM | POA: Diagnosis not present

## 2020-04-24 DIAGNOSIS — M5416 Radiculopathy, lumbar region: Secondary | ICD-10-CM | POA: Diagnosis not present

## 2020-04-24 DIAGNOSIS — I10 Essential (primary) hypertension: Secondary | ICD-10-CM | POA: Diagnosis not present

## 2020-04-24 DIAGNOSIS — E038 Other specified hypothyroidism: Secondary | ICD-10-CM | POA: Diagnosis not present

## 2020-04-30 ENCOUNTER — Other Ambulatory Visit: Payer: Self-pay | Admitting: Endocrinology

## 2020-04-30 DIAGNOSIS — E039 Hypothyroidism, unspecified: Secondary | ICD-10-CM

## 2020-04-30 DIAGNOSIS — E038 Other specified hypothyroidism: Secondary | ICD-10-CM | POA: Diagnosis not present

## 2020-05-15 ENCOUNTER — Ambulatory Visit
Admission: RE | Admit: 2020-05-15 | Discharge: 2020-05-15 | Disposition: A | Discharge status: Home / Self Care | Payer: Medicare Other | Source: Ambulatory Visit | Attending: Endocrinology | Admitting: Endocrinology

## 2020-05-15 DIAGNOSIS — E039 Hypothyroidism, unspecified: Secondary | ICD-10-CM

## 2020-08-06 DIAGNOSIS — R05 Cough: Secondary | ICD-10-CM | POA: Diagnosis not present

## 2020-08-06 DIAGNOSIS — E039 Hypothyroidism, unspecified: Secondary | ICD-10-CM | POA: Diagnosis not present

## 2020-08-06 DIAGNOSIS — I10 Essential (primary) hypertension: Secondary | ICD-10-CM | POA: Diagnosis not present

## 2020-08-06 DIAGNOSIS — I480 Paroxysmal atrial fibrillation: Secondary | ICD-10-CM | POA: Diagnosis not present

## 2020-08-19 DIAGNOSIS — H00021 Hordeolum internum right upper eyelid: Secondary | ICD-10-CM | POA: Diagnosis not present

## 2020-08-21 ENCOUNTER — Other Ambulatory Visit: Payer: Self-pay | Admitting: Oncology

## 2020-08-24 ENCOUNTER — Other Ambulatory Visit: Payer: Self-pay | Admitting: Oncology

## 2020-08-28 DIAGNOSIS — D509 Iron deficiency anemia, unspecified: Secondary | ICD-10-CM | POA: Diagnosis not present

## 2020-08-28 DIAGNOSIS — E669 Obesity, unspecified: Secondary | ICD-10-CM | POA: Diagnosis not present

## 2020-08-28 DIAGNOSIS — I480 Paroxysmal atrial fibrillation: Secondary | ICD-10-CM | POA: Diagnosis not present

## 2020-08-28 DIAGNOSIS — E039 Hypothyroidism, unspecified: Secondary | ICD-10-CM | POA: Diagnosis not present

## 2020-09-23 DIAGNOSIS — Z86010 Personal history of colonic polyps: Secondary | ICD-10-CM | POA: Diagnosis not present

## 2020-09-23 DIAGNOSIS — K219 Gastro-esophageal reflux disease without esophagitis: Secondary | ICD-10-CM | POA: Diagnosis not present

## 2020-09-23 DIAGNOSIS — R1319 Other dysphagia: Secondary | ICD-10-CM | POA: Diagnosis not present

## 2020-10-02 ENCOUNTER — Other Ambulatory Visit: Payer: Self-pay | Admitting: Neurological Surgery

## 2020-10-02 ENCOUNTER — Other Ambulatory Visit (HOSPITAL_COMMUNITY): Payer: Self-pay | Admitting: Neurological Surgery

## 2020-10-02 DIAGNOSIS — M4726 Other spondylosis with radiculopathy, lumbar region: Secondary | ICD-10-CM | POA: Diagnosis not present

## 2020-10-02 DIAGNOSIS — M48062 Spinal stenosis, lumbar region with neurogenic claudication: Secondary | ICD-10-CM

## 2020-11-06 ENCOUNTER — Ambulatory Visit (HOSPITAL_COMMUNITY)
Admission: RE | Admit: 2020-11-06 | Discharge: 2020-11-06 | Disposition: A | Discharge status: Home / Self Care | Payer: Medicare Other | Source: Ambulatory Visit | Attending: Neurological Surgery | Admitting: Neurological Surgery

## 2020-11-06 ENCOUNTER — Other Ambulatory Visit: Payer: Self-pay

## 2020-11-06 DIAGNOSIS — M5127 Other intervertebral disc displacement, lumbosacral region: Secondary | ICD-10-CM | POA: Diagnosis not present

## 2020-11-06 DIAGNOSIS — M4726 Other spondylosis with radiculopathy, lumbar region: Secondary | ICD-10-CM | POA: Insufficient documentation

## 2020-11-06 DIAGNOSIS — M5416 Radiculopathy, lumbar region: Secondary | ICD-10-CM | POA: Diagnosis not present

## 2020-11-06 DIAGNOSIS — M48062 Spinal stenosis, lumbar region with neurogenic claudication: Secondary | ICD-10-CM

## 2020-11-06 DIAGNOSIS — Z981 Arthrodesis status: Secondary | ICD-10-CM | POA: Diagnosis not present

## 2020-11-06 DIAGNOSIS — M48061 Spinal stenosis, lumbar region without neurogenic claudication: Secondary | ICD-10-CM | POA: Diagnosis not present

## 2020-11-06 DIAGNOSIS — M4326 Fusion of spine, lumbar region: Secondary | ICD-10-CM | POA: Diagnosis not present

## 2020-11-06 MED ORDER — LIDOCAINE HCL (PF) 1 % IJ SOLN
5.0000 mL | Freq: Once | INTRAMUSCULAR | Status: AC
  Administered 2020-11-06: 5 mL via INTRADERMAL

## 2020-11-06 MED ORDER — IOHEXOL 180 MG/ML  SOLN
20.0000 mL | Freq: Once | INTRAMUSCULAR | Status: AC | PRN
  Administered 2020-11-06: 12 mL via INTRATHECAL

## 2020-11-06 MED ORDER — OXYCODONE-ACETAMINOPHEN 5-325 MG PO TABS: 1.0000 | ORAL_TABLET | ORAL | Status: DC | PRN

## 2020-11-06 MED ORDER — DIAZEPAM 5 MG PO TABS: 10.0000 mg | ORAL_TABLET | Freq: Once | ORAL | Status: AC

## 2020-11-06 MED ORDER — DIAZEPAM 5 MG PO TABS
ORAL_TABLET | ORAL | Status: AC
  Administered 2020-11-06: 10 mg via ORAL
  Filled 2020-11-06: qty 2

## 2020-11-06 MED ORDER — ONDANSETRON HCL 4 MG/2ML IJ SOLN: 4.0000 mg | Freq: Four times a day (QID) | INTRAMUSCULAR | Status: DC | PRN

## 2020-11-06 NOTE — Procedures (Addendum)
Kathryn [name] is a 64 year old individual whose had previous decompression and fusion at L4-L5 subsequent osteoporotic compression fracture of L1 and then interbody fusions done through an extreme lateral process at L2-3 and L3-4.  She has had chronic back pain and lumbar radiculopathy.  She has had surgery multiple times and is having a myelogram now to better evaluate the condition of her back as regards her radiculopathy particularly in regards to lumbar spinal stenosis.  Pre op Dx: Lumbar stenosis with radiculopathy history of fusion Post op Dx: Same Procedure: Lumbar myelogram Surgeon: Kristapher [place] Puncture level: L2-3 Fluid color: Clear colorless Injection: Omnipaque 180, 12 mL Findings: Moderate stenosis at L2-3 and L3-4 no overt canal compromise.  Concern for pseudoarthrosis at L3-4.  Further evaluation with CT scanning.

## 2020-11-06 NOTE — Discharge Instructions (Signed)
Myelogram and Lumbar Puncture Discharge Instructions  1. Go home and rest quietly for the next 24 hours.  It is important to lie flat for the next 24 hours.  Get up only to go to the restroom.  You may lie in the bed or on a couch on your back, your stomach, your left side or your right side.  You may have one pillow under your head.  You may have pillows between your knees while you are on your side or under your knees while you are on your back.  2. DO NOT drive today.  Recline the seat as far back as it will go, while still wearing your seat belt, on the way home.  3. You may get up to go to the bathroom as needed.  You may sit up for 10 minutes to eat.  You may resume your normal diet and medications unless otherwise indicated.  4. The incidence of headache, nausea, or vomiting is about 5% (one in 20 patients).  If you develop a headache, lie flat and drink plenty of fluids until the headache goes away.  Caffeinated beverages may be helpful.  If you develop severe nausea and vomiting or a headache that does not go away with flat bed rest, call Dr Elsner's office.  5. You may resume normal activities after your 24 hours of bed rest is over; however, do not exert yourself strongly or do any heavy lifting tomorrow.  6. Call your physician for a follow-up appointment.  The results of your myelogram will be sent directly to your physician by the following day.  7. If you have any questions or if complications develop after you arrive home, please call Dr Elsner's office.  Discharge instructions have been explained to the patient.  The patient, or the person responsible for the patient, fully understands these instructions.   

## 2020-11-13 DIAGNOSIS — S32009K Unspecified fracture of unspecified lumbar vertebra, subsequent encounter for fracture with nonunion: Secondary | ICD-10-CM | POA: Diagnosis not present

## 2020-11-13 DIAGNOSIS — I10 Essential (primary) hypertension: Secondary | ICD-10-CM | POA: Diagnosis not present

## 2020-11-13 DIAGNOSIS — Z6836 Body mass index (BMI) 36.0-36.9, adult: Secondary | ICD-10-CM | POA: Diagnosis not present

## 2020-11-18 DIAGNOSIS — Z1159 Encounter for screening for other viral diseases: Secondary | ICD-10-CM | POA: Diagnosis not present

## 2020-11-20 ENCOUNTER — Other Ambulatory Visit: Payer: Self-pay

## 2020-11-20 ENCOUNTER — Emergency Department (HOSPITAL_COMMUNITY): Payer: Medicare Other

## 2020-11-20 ENCOUNTER — Emergency Department (HOSPITAL_COMMUNITY)
Admission: EM | Admit: 2020-11-20 | Discharge: 2020-11-20 | Disposition: A | Discharge status: Home / Self Care | Payer: Medicare Other | Attending: Emergency Medicine | Admitting: Emergency Medicine

## 2020-11-20 ENCOUNTER — Encounter (HOSPITAL_COMMUNITY): Payer: Self-pay | Admitting: Emergency Medicine

## 2020-11-20 DIAGNOSIS — R000 Tachycardia, unspecified: Secondary | ICD-10-CM | POA: Insufficient documentation

## 2020-11-20 DIAGNOSIS — I480 Paroxysmal atrial fibrillation: Secondary | ICD-10-CM

## 2020-11-20 DIAGNOSIS — R519 Headache, unspecified: Secondary | ICD-10-CM | POA: Diagnosis not present

## 2020-11-20 DIAGNOSIS — Z5321 Procedure and treatment not carried out due to patient leaving prior to being seen by health care provider: Secondary | ICD-10-CM | POA: Insufficient documentation

## 2020-11-20 DIAGNOSIS — I10 Essential (primary) hypertension: Secondary | ICD-10-CM | POA: Diagnosis not present

## 2020-11-20 DIAGNOSIS — R0602 Shortness of breath: Secondary | ICD-10-CM | POA: Insufficient documentation

## 2020-11-20 DIAGNOSIS — I517 Cardiomegaly: Secondary | ICD-10-CM | POA: Diagnosis not present

## 2020-11-20 HISTORY — DX: Paroxysmal atrial fibrillation: I48.0

## 2020-11-20 LAB — CBC
HCT: 37.2 % (ref 36.0–46.0)
Hemoglobin: 11.1 g/dL — ABNORMAL LOW (ref 12.0–15.0)
MCH: 25.5 pg — ABNORMAL LOW (ref 26.0–34.0)
MCHC: 29.8 g/dL — ABNORMAL LOW (ref 30.0–36.0)
MCV: 85.5 fL (ref 80.0–100.0)
Platelets: 254 10*3/uL (ref 150–400)
RBC: 4.35 MIL/uL (ref 3.87–5.11)
RDW: 15.9 % — ABNORMAL HIGH (ref 11.5–15.5)
WBC: 6.1 10*3/uL (ref 4.0–10.5)
nRBC: 0 % (ref 0.0–0.2)

## 2020-11-20 LAB — BASIC METABOLIC PANEL
Anion gap: 13 (ref 5–15)
BUN: 9 mg/dL (ref 8–23)
CO2: 23 mmol/L (ref 22–32)
Calcium: 9.2 mg/dL (ref 8.9–10.3)
Chloride: 103 mmol/L (ref 98–111)
Creatinine, Ser: 0.82 mg/dL (ref 0.44–1.00)
GFR, Estimated: >60 mL/min (ref >60)
Glucose, Bld: 111 mg/dL — ABNORMAL HIGH (ref 70–99)
Potassium: 3.2 mmol/L — ABNORMAL LOW (ref 3.5–5.1)
Sodium: 139 mmol/L (ref 135–145)

## 2020-11-20 NOTE — ED Notes (Signed)
Pt family member taking pt to other hospital. Pt notified that she would go back soon but stil seen leaving

## 2020-11-20 NOTE — ED Notes (Signed)
Pt family member being very rude and loud in lobby to staff. Pt asked to leave

## 2020-11-20 NOTE — ED Triage Notes (Signed)
Pt arrives to ED from [facility] with c/o of tachycardia, HTN, and SOB. Per Deboraha Sprang RN, pt was scheduled for routine EGD and colonoscopy but when she arrived her BP was in the 190's systolic. Pt recieved 5mg  IV labetalol x2 which lowered BP but inversely raised her HR. Pt states she was SOB when she woke this morning. Currently has headache, no CP, fever, or chills.

## 2020-11-20 NOTE — ED Notes (Signed)
Pt refusing xray.

## 2020-12-02 ENCOUNTER — Ambulatory Visit (INDEPENDENT_AMBULATORY_CARE_PROVIDER_SITE_OTHER): Payer: Medicare Other | Admitting: Internal Medicine

## 2020-12-02 ENCOUNTER — Other Ambulatory Visit: Payer: Self-pay

## 2020-12-02 ENCOUNTER — Encounter: Payer: Self-pay | Admitting: Internal Medicine

## 2020-12-02 VITALS — BP 148/100 | HR 82 | Ht 66.0 in | Wt 226.0 lb

## 2020-12-02 DIAGNOSIS — I10 Essential (primary) hypertension: Secondary | ICD-10-CM

## 2020-12-02 DIAGNOSIS — I4891 Unspecified atrial fibrillation: Secondary | ICD-10-CM | POA: Diagnosis not present

## 2020-12-02 MED ORDER — RIVAROXABAN 20 MG PO TABS: 20.0000 mg | ORAL_TABLET | Freq: Every day | ORAL | 3 refills | Status: DC

## 2020-12-02 MED ORDER — METOPROLOL SUCCINATE ER 100 MG PO TB24: 100.0000 mg | ORAL_TABLET | Freq: Every day | ORAL | 3 refills | Status: DC

## 2020-12-02 NOTE — Patient Instructions (Signed)
Medication Instructions:  Increase your metoprolol succinate to 100 mg daily Start Xarelto 20 mg daily   Labwork: None ordered.  Testing/Procedures: please schedule echo Your physician has requested that you have an echocardiogram. Echocardiography is a painless test that uses sound waves to create images of your heart. It provides your doctor with information about the size and shape of your heart and how well your heart's chambers and valves are working. This procedure takes approximately one hour. There are no restrictions for this procedure.    Follow-Up:  Your physician wants you to follow-up in: 6 weeks with the [facility]. They will contact you to schedule.    Any Other Special Instructions Will Be Listed Below (If Applicable).  If you need a refill on your cardiac medications before your next appointment, please call your pharmacy.

## 2020-12-02 NOTE — Progress Notes (Signed)
Electrophysiology Office Note   Date:  12/02/2020   ID:  Libertie, Hausler 02-19-1956, MRN [id number]  PCP:  [name], MD   Primary Electrophysiologist: Thompson Grayer, MD    CC: afib   History of Present Illness: Kathryn [name] is a 65 y.o. female who presents today for electrophysiology evaluation.   She is referred by Dr Forde Dandy for EP consultation regarding new onset afib. The patient reports presenting for endoscopy 11/20/20.  She was found to have afib with RVR.  Strips are reviewed by me today.   She reports associated palpitations.  Her BP was also quite high at that time.  She was given IV lebatolol and discharged.  She went to [facility] ED but converted to sinus rhythm while waiting.  She went home without being seen.  In retrospect, she has had similar palpitations in the past which are typical short-lived.  She has had some palpitations with exertion. She snores but feels reasonably well rested in the morning.  She is not very active. Today, she denies symptoms of palpitations, chest pain, shortness of breath, orthopnea, PND, lower extremity edema, claudication, dizziness, presyncope, syncope, bleeding, or neurologic sequela. The patient is tolerating medications without difficulties and is otherwise without complaint today.    Past Medical History:  Diagnosis Date  . Anxiety   . Cancer ([place]) 10/2016   Breast right,  lumpectomy,  did not require chemo/xrt  . DDD (degenerative disc disease), cervical   . Depression   . Elevated cholesterol   . GERD (gastroesophageal reflux disease)   . H/O hiatal hernia   . Hypertension   . Hypothyroidism   . Paroxysmal atrial fibrillation ([place]) 11/20/2020  . PONV (postoperative nausea and vomiting)    felt cut with one of past knee surgeries  . Tremor    patient states "tremor has gotten better" on 06/30/19  . Wears partial dentures    upper   Past Surgical History:  Procedure Laterality Date  . ABDOMINAL HYSTERECTOMY  yrs ago   . ANTERIOR LAT LUMBAR FUSION N/A 11/21/2019   Procedure: Lumbar two-three Lumbar three-four Anterolateral lumbar interbody fusion with lateral plate;  Surgeon: Kristeen Miss, MD;  Location: [place];  Service: Neurosurgery;  Laterality: N/A;  . BREAST EXCISIONAL BIOPSY Right 11/2016   ALH and papillomas  . BREAST LUMPECTOMY WITH RADIOACTIVE SEED LOCALIZATION Right 12/15/2016   Procedure: RADIOACTIVE SEED X 2 GUIDED EXCISIONAL BREAST BIOPSY;  Surgeon: Rolm Bookbinder, MD;  Location: [facility];  Service: General;  Laterality: Right;  . CARPAL TUNNEL RELEASE Bilateral yrs ago   x 2  . cervical neck fusion  yrs ago   C 3 and C 4   . COLONOSCOPY    . left knee arthroscopy   last done 2008   x 3  . left shoulder arthroscopy  yrs ago  . lower back surgery  2005  . right total knee replacement   2008  . TOTAL KNEE ARTHROPLASTY  01/14/2012   Procedure: TOTAL KNEE ARTHROPLASTY;  Surgeon: Johnn Hai, MD;  [facility];  Service: Orthopedics;  Laterality: Left;  preop femoral nerve block  . UPPER GI ENDOSCOPY    . WISDOM TOOTH EXTRACTION       Current Outpatient Medications  Medication Sig Dispense Refill  . acetaminophen (TYLENOL) 500 MG tablet Take 500-1,000 mg by mouth every 6 (six) hours as needed for moderate pain.    Marland Kitchen ALPRAZolam (XANAX) 0.25 MG tablet Take 0.25 mg by  mouth daily.    . Calcium Carb-Cholecalciferol (CALCIUM 600 + D PO) Take 1 tablet by mouth daily.    . DULoxetine (CYMBALTA) 30 MG capsule Take 30 mg by mouth at bedtime. Take with 60 mg to equal 90 mg at night    . DULoxetine (CYMBALTA) 60 MG capsule Take 60 mg by mouth at bedtime. Take with 30 mg to equal 90 mg at night    . levothyroxine (SYNTHROID) 200 MCG tablet Take 200 mcg by mouth daily before breakfast.    . metoprolol succinate (TOPROL-XL) 100 MG 24 hr tablet Take 1 tablet (100 mg total) by mouth daily. Take with or immediately following a meal. 90 tablet 3  . omeprazole (PRILOSEC OTC) 20 MG  tablet Take 20 mg by mouth daily.    Marland Kitchen oxyCODONE-acetaminophen (PERCOCET/ROXICET) 5-325 MG tablet Take 1-2 tablets by mouth every 4 (four) hours as needed for moderate pain or severe pain. (Patient taking differently: Take 1 tablet by mouth every 6 (six) hours as needed for moderate pain or severe pain.) 60 tablet 0  . pantoprazole (PROTONIX) 40 MG tablet Take 40 mg by mouth daily.    . rivaroxaban (XARELTO) 20 MG TABS tablet Take 1 tablet (20 mg total) by mouth daily with supper. 90 tablet 3  . tamoxifen (NOLVADEX) 20 MG tablet TAKE 1 TABLET BY MOUTH DAILY (Patient taking differently: Take 20 mg by mouth daily.) 90 tablet 12   No current facility-administered medications for this visit.    Allergies:   Patient has no known allergies.   Social History:  The patient  reports that she has never smoked. She has never used smokeless tobacco. She reports current alcohol use. She reports that she does not use drugs.   Family History:  + HTN   ROS:  Please see the history of present illness.   All other systems are personally reviewed and negative.    PHYSICAL EXAM: VS:  BP (!) 148/100   Pulse 82   Ht 5\' 6"  (1.676 m)   Wt 226 lb (102.5 kg)   LMP  (LMP Unknown)   SpO2 95%   BMI 36.48 kg/m  , BMI Body mass index is 36.48 kg/m. GEN: overweight, in no acute distress HEENT: normal Neck: no JVD, carotid bruits, or masses Cardiac: RRR; no murmurs, rubs, or gallops,no edema  Respiratory:  clear to auscultation bilaterally, normal work of breathing GI: soft, nontender, nondistended, + BS MS: no deformity or atrophy Skin: warm and dry  Neuro:  Strength and sensation are intact Psych: euthymic mood, full affect  EKG:  EKG is ordered today. The ekg ordered today is personally reviewed and shows sinus rhythm   Recent Labs: 11/20/2020: BUN 9; Creatinine, Ser 0.82; Hemoglobin 11.1; Platelets 254; Potassium 3.2; Sodium 139  personally reviewed   Lipid Panel  No results found for: CHOL,  TRIG, HDL, CHOLHDL, VLDL, LDLCALC, LDLDIRECT personally reviewed   Wt Readings from Last 3 Encounters:  12/02/20 226 lb (102.5 kg)  11/06/20 212 lb (96.2 kg)  11/21/19 222 lb 9.6 oz (101 kg)     Other studies personally reviewed: Additional studies/ records that were reviewed today include: rhythm strip from endoscopy, endoscopy note  Review of the above records today demonstrates: as above   ASSESSMENT AND PLAN:  1.  Paroxysmal atrial fibrillation The patient has recently diagnosed afib.   We discussed at length today. I will increase toprol to 100mg  daily today We discussed lifestyle modification at length. chads2vasc score is 3.  I have started xarelto 20mg  daily today. We will obtain an echo.  2. HTN Recently elevated Increase toprol to 100mg  daily Echo  3. Obesity Body mass index is 36.48 kg/m. Lifestyle modification is advised    Risks, benefits and potential toxicities for medications prescribed and/or refilled reviewed with patient today.    Follow-up:  AF clinic in 2 months  Current medicines are reviewed at length with the patient today.   The patient does not have concerns regarding her medicines.  The following changes were made today:  none  Labs/ tests ordered today include:  Orders Placed This Encounter  Procedures  . EKG 12-Lead  . ECHOCARDIOGRAM COMPLETE     Signed, Thompson Grayer, MD  12/02/2020 9:11 PM     [place] [id number] [phone] (office) [phone] (fax)

## 2020-12-06 ENCOUNTER — Other Ambulatory Visit (HOSPITAL_COMMUNITY): Payer: Medicare Other

## 2020-12-12 ENCOUNTER — Telehealth (HOSPITAL_COMMUNITY): Payer: Self-pay | Admitting: Internal Medicine

## 2020-12-12 NOTE — Telephone Encounter (Signed)
Patient called and cancelled echocardiogram for reason below:  [name], Kathryn N  Cancel Rsn: Patient ([place]. Husband canceling will callback to reschedule.)  Order will be removed from the [facility] and when patient calls back we will reinstate the order.   Thank you

## 2020-12-12 NOTE — Telephone Encounter (Signed)
Will send this message to pts ordering Provider and covering RN, as an FYI. 

## 2021-01-03 IMAGING — RF DG C-ARM 61-120 MIN
1 series · 2 of 2 positions shown · non-contrast
Comparison: CT total myelogram dated May 31, 2019.

CLINICAL DATA: L4-L5 PLIF.

EXAM:
DG C-ARM 61-120 MIN; LUMBAR SPINE - 2-3 VIEW

[Series 1: run · 2 of 2 slices shown]
[im 1/2]
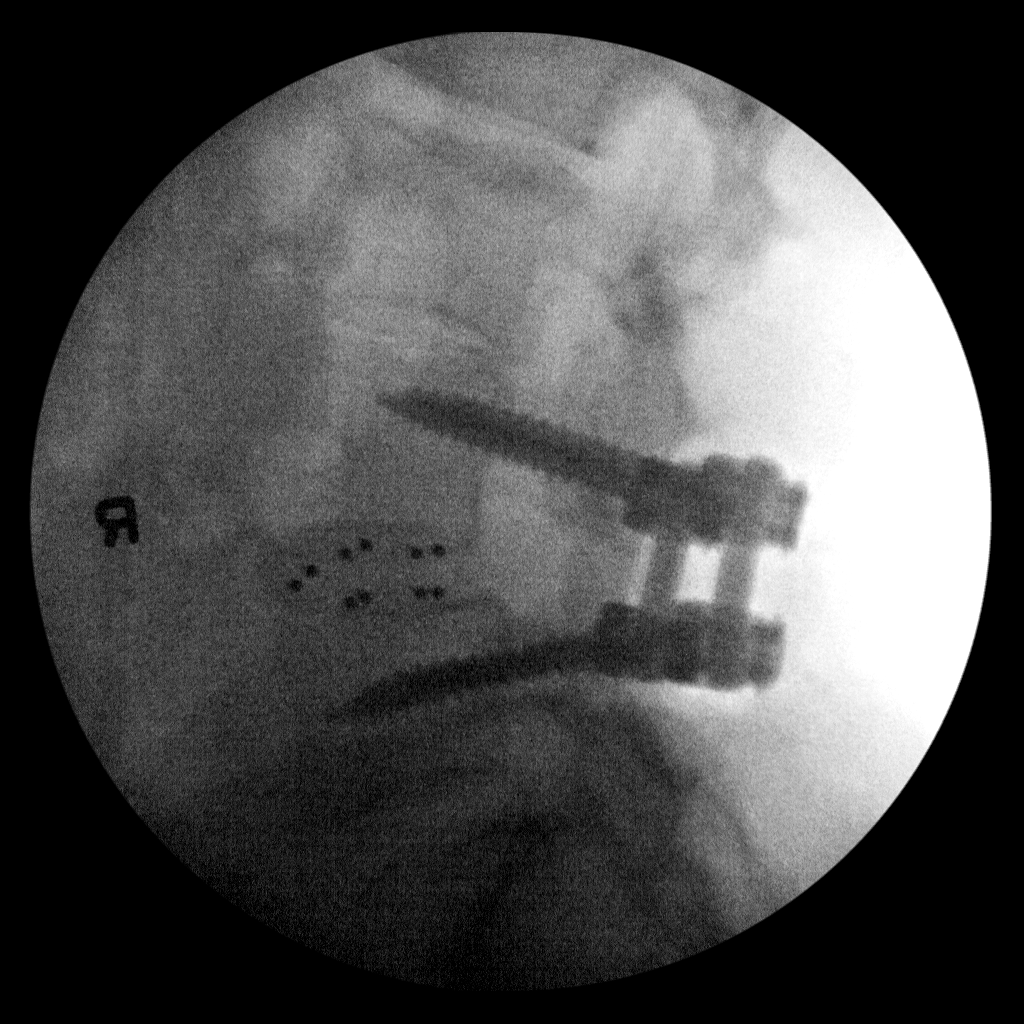
[im 2/2]
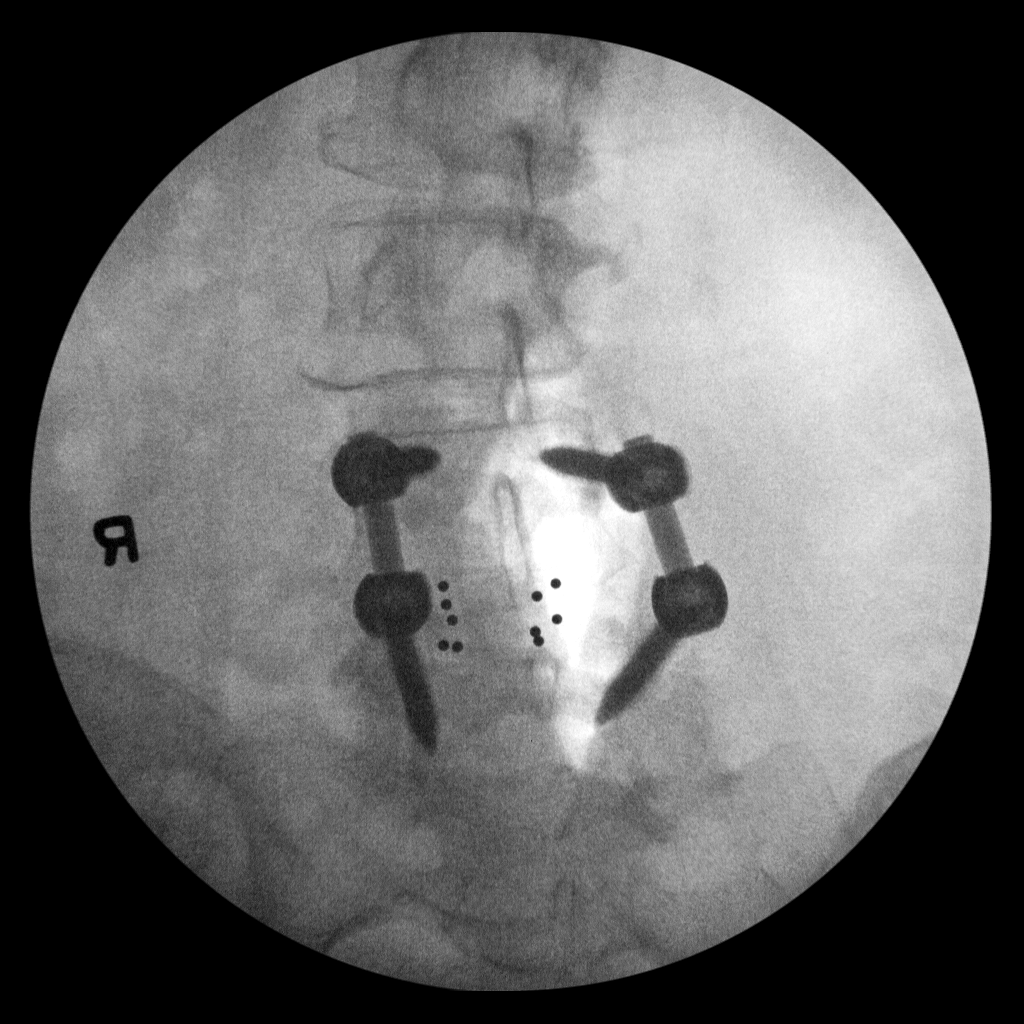

[2 of 2 positions shown; findings below may reference images not displayed]

FLUOROSCOPY TIME:  21 seconds

C-arm fluoroscopic images were obtained intraoperatively and
submitted for post operative interpretation.
FINDINGS: Frontal and lateral intraoperative fluoroscopic images demonstrate
interval L4-L5 PLIF. Hardware is intact. No acute osseous
abnormality.
IMPRESSION: 1. Intraoperative fluoroscopic guidance for L4-L5 PLIF.

## 2021-01-06 ENCOUNTER — Telehealth: Payer: Self-pay | Admitting: *Deleted

## 2021-01-06 NOTE — Telephone Encounter (Signed)
   [facility] Pre-operative Risk Assessment    HEARTCARE STAFF: - Please ensure there is not already an duplicate clearance open for this procedure. - Under Visit Info/Reason for Call, type in Other and utilize the format Clearance MM/DD/YY or Clearance TBD. Do not use dashes or single digits. - If request is for dental extraction, please clarify the # of teeth to be extracted.  Request for surgical clearance:  1. What type of surgery is being performed? Colonoscopy/endoscopy   2. When is this surgery scheduled? 03/06/2021   3. What type of clearance is required (medical clearance vs. Pharmacy clearance to hold med vs. Both)? Both  4. Are there any medications that need to be held prior to surgery and how long?Xarelto, x3 days  5. Practice name and name of physician performing surgery? [facility]; Dr Therisa Doyne   6. What is the office phone number? [phone]   7.   What is the office fax number? [phone]  8.   Anesthesia type (None, local, MAC, general) ? Propofol   Kathryn [name] L 01/06/2021, 12:29 PM  _________________________________________________________________   (provider comments below)

## 2021-01-07 NOTE — Telephone Encounter (Signed)
Will send to PharmD for rec's re: holding anticoagulation.  Richardson Dopp, PA-C    01/07/2021 1:40 PM

## 2021-01-07 NOTE — Telephone Encounter (Signed)
Patient with diagnosis of atrial fibrillation on xarelto for anticoagulation.    Procedure: colonoscopy/endoscopy Date of procedure: 03/06/21   CHA2DS2-VASc Score = 3  This indicates a 3.2% annual risk of stroke. The patient's score is based upon: CHF History: No HTN History: Yes Diabetes History: No Stroke History: No Vascular Disease History: No Age Score: 1 Gender Score: 1  CrCl 59.3 (with IBW) Platelet count 254  Per office protocol, patient can hold Xarelto for 2 days prior to procedure.   Patient will not need bridging with Lovenox (enoxaparin) around procedure.

## 2021-01-09 NOTE — Telephone Encounter (Signed)
appt notes have been updated to reflect pre op clearance is needed. Will send notes to [name], NP for upcoming appt.

## 2021-01-09 NOTE — Telephone Encounter (Signed)
Please add pre-op clearance to patients appointment 01/16/21 with [name] NP in response to recently changed medications per Dr. Rayann Heman and new dx of AF

## 2021-01-16 ENCOUNTER — Encounter (HOSPITAL_COMMUNITY): Payer: Self-pay | Admitting: Nurse Practitioner

## 2021-01-16 ENCOUNTER — Other Ambulatory Visit: Payer: Self-pay

## 2021-01-16 ENCOUNTER — Ambulatory Visit (HOSPITAL_COMMUNITY)
Admission: RE | Admit: 2021-01-16 | Discharge: 2021-01-16 | Disposition: A | Discharge status: Home / Self Care | Payer: Medicare Other | Source: Ambulatory Visit | Attending: Nurse Practitioner | Admitting: Nurse Practitioner

## 2021-01-16 VITALS — BP 136/88 | HR 68 | Ht 66.0 in | Wt 225.6 lb

## 2021-01-16 DIAGNOSIS — I480 Paroxysmal atrial fibrillation: Secondary | ICD-10-CM | POA: Insufficient documentation

## 2021-01-16 DIAGNOSIS — I10 Essential (primary) hypertension: Secondary | ICD-10-CM | POA: Insufficient documentation

## 2021-01-16 DIAGNOSIS — I4891 Unspecified atrial fibrillation: Secondary | ICD-10-CM | POA: Diagnosis not present

## 2021-01-16 DIAGNOSIS — Z7901 Long term (current) use of anticoagulants: Secondary | ICD-10-CM | POA: Diagnosis not present

## 2021-01-16 DIAGNOSIS — D6869 Other thrombophilia: Secondary | ICD-10-CM | POA: Diagnosis not present

## 2021-01-16 DIAGNOSIS — Z79899 Other long term (current) drug therapy: Secondary | ICD-10-CM | POA: Diagnosis not present

## 2021-01-16 LAB — CBC
HCT: 33.2 % — ABNORMAL LOW (ref 36.0–46.0)
Hemoglobin: 10.3 g/dL — ABNORMAL LOW (ref 12.0–15.0)
MCH: 26.7 pg (ref 26.0–34.0)
MCHC: 31 g/dL (ref 30.0–36.0)
MCV: 86 fL (ref 80.0–100.0)
Platelets: 214 10*3/uL (ref 150–400)
RBC: 3.86 MIL/uL — ABNORMAL LOW (ref 3.87–5.11)
RDW: 17.3 % — ABNORMAL HIGH (ref 11.5–15.5)
WBC: 5 10*3/uL (ref 4.0–10.5)
nRBC: 0 % (ref 0.0–0.2)

## 2021-01-16 LAB — BASIC METABOLIC PANEL
Anion gap: 10 (ref 5–15)
BUN: 11 mg/dL (ref 8–23)
CO2: 27 mmol/L (ref 22–32)
Calcium: 9 mg/dL (ref 8.9–10.3)
Chloride: 103 mmol/L (ref 98–111)
Creatinine, Ser: 0.84 mg/dL (ref 0.44–1.00)
GFR, Estimated: >60 mL/min (ref >60)
Glucose, Bld: 107 mg/dL — ABNORMAL HIGH (ref 70–99)
Potassium: 3.6 mmol/L (ref 3.5–5.1)
Sodium: 140 mmol/L (ref 135–145)

## 2021-01-16 MED ORDER — DILTIAZEM HCL 30 MG PO TABS: ORAL_TABLET | ORAL | 1 refills | Status: DC

## 2021-01-16 NOTE — Patient Instructions (Signed)
Cardizem 30mg -- take 1 tablet every 4 hours AS NEEDED for heart rate >100 as long as top number of blood pressure >100.  

## 2021-01-16 NOTE — Progress Notes (Signed)
Primary Care Physician: [name], MD Referring Physician: Dr. Leane Platt is a 65 y.o. female with a h/o palpitations that recently saw [name] for afib/HTN that was noted at time of scheduled endoscopy/colonoscopy. Of note, she did not take her BB dose prior to procedure that day. The procedure was cancelled. She was sent to the ER and she  left 2/2 long wait in the lobby. She was feeling improved. She saw Dr. Rayann Heman in f/u and he increased her BB and scheduled an echo which is pending tomorrow. He also started her on xarelto 20 mg daily for a CHA2DS2VASc score of 3. She has not had any issues with the change in  her meds. Colonoscopy/endoscopy was scheduled as she saw blood in her stool in the past but has not seen any with start of DOAC. She also has issues with food getting hung from  time to time.  She still feels minutes of palpitations but no extended periods of afib. She limits caffeine, no alcohol or tobacco and husband reports some snoring but no apnea. She is in rhythm today.   Today, she denies symptoms of palpitations, chest pain, shortness of breath, orthopnea, PND, lower extremity edema, dizziness, presyncope, syncope, or neurologic sequela. The patient is tolerating medications without difficulties and is otherwise without complaint today.   Past Medical History:  Diagnosis Date  . Anxiety   . Cancer ([place]) 10/2016   Breast right,  lumpectomy,  did not require chemo/xrt  . DDD (degenerative disc disease), cervical   . Depression   . Elevated cholesterol   . GERD (gastroesophageal reflux disease)   . H/O hiatal hernia   . Hypertension   . Hypothyroidism   . Paroxysmal atrial fibrillation ([place]) 11/20/2020  . PONV (postoperative nausea and vomiting)    felt cut with one of past knee surgeries  . Tremor    patient states "tremor has gotten better" on 06/30/19  . Wears partial dentures    upper   Past Surgical History:  Procedure Laterality Date  .  ABDOMINAL HYSTERECTOMY  yrs ago  . ANTERIOR LAT LUMBAR FUSION N/A 11/21/2019   Procedure: Lumbar two-three Lumbar three-four Anterolateral lumbar interbody fusion with lateral plate;  Surgeon: Kristeen Miss, MD;  Location: [facility];  Service: Neurosurgery;  Laterality: N/A;  . BREAST EXCISIONAL BIOPSY Right 11/2016   ALH and papillomas  . BREAST LUMPECTOMY WITH RADIOACTIVE SEED LOCALIZATION Right 12/15/2016   Procedure: RADIOACTIVE SEED X 2 GUIDED EXCISIONAL BREAST BIOPSY;  Surgeon: Rolm Bookbinder, MD;  Location: [facility];  Service: General;  Laterality: Right;  . CARPAL TUNNEL RELEASE Bilateral yrs ago   x 2  . cervical neck fusion  yrs ago   C 3 and C 4   . COLONOSCOPY    . left knee arthroscopy   last done 2008   x 3  . left shoulder arthroscopy  yrs ago  . lower back surgery  2005  . right total knee replacement   2008  . TOTAL KNEE ARTHROPLASTY  01/14/2012   Procedure: TOTAL KNEE ARTHROPLASTY;  Surgeon: Johnn Hai, MD;  [facility];  Service: Orthopedics;  Laterality: Left;  preop femoral nerve block  . UPPER GI ENDOSCOPY    . WISDOM TOOTH EXTRACTION      Current Outpatient Medications  Medication Sig Dispense Refill  . acetaminophen (TYLENOL) 500 MG tablet Take 500-1,000 mg by mouth every 6 (six) hours as needed for moderate pain.    Marland Kitchen  ALPRAZolam (XANAX) 0.25 MG tablet Take 0.25 mg by mouth daily.    . Calcium Carb-Cholecalciferol (CALCIUM 600 + D PO) Take 1 tablet by mouth daily.    Marland Kitchen diltiazem (CARDIZEM) 30 MG tablet Take 1 tablet every 4 hours AS NEEDED for heart rate >100 as long as blood pressure >100. 30 tablet 1  . DULoxetine (CYMBALTA) 30 MG capsule Take 30 mg by mouth at bedtime. Take with 60 mg to equal 90 mg at night    . DULoxetine (CYMBALTA) 60 MG capsule Take 60 mg by mouth at bedtime. Take with 30 mg to equal 90 mg at night    . levothyroxine (SYNTHROID) 200 MCG tablet Take 200 mcg by mouth daily before breakfast.    . metoprolol succinate  (TOPROL-XL) 100 MG 24 hr tablet Take 1 tablet (100 mg total) by mouth daily. Take with or immediately following a meal. 90 tablet 3  . omeprazole (PRILOSEC OTC) 20 MG tablet Take 20 mg by mouth daily.    Marland Kitchen oxyCODONE-acetaminophen (PERCOCET/ROXICET) 5-325 MG tablet Take 1-2 tablets by mouth every 4 (four) hours as needed for moderate pain or severe pain. (Patient taking differently: Take 1 tablet by mouth every 6 (six) hours as needed for moderate pain or severe pain.) 60 tablet 0  . rivaroxaban (XARELTO) 20 MG TABS tablet Take 1 tablet (20 mg total) by mouth daily with supper. 90 tablet 3  . tamoxifen (NOLVADEX) 20 MG tablet TAKE 1 TABLET BY MOUTH DAILY 90 tablet 12  . Cholecalciferol (VITAMIN D3) 25 MCG (1000 UT) CAPS      No current facility-administered medications for this encounter.    No Known Allergies  Social History   Socioeconomic History  . Marital status: Married    Spouse name: Not on file  . Number of children: Not on file  . Years of education: Not on file  . Highest education level: Not on file  Occupational History  . Not on file  Tobacco Use  . Smoking status: Never Smoker  . Smokeless tobacco: Never Used  Vaping Use  . Vaping Use: Never used  Substance and Sexual Activity  . Alcohol use: Yes    Comment: occasional wine  . Drug use: No  . Sexual activity: Not on file    Comment: Hysterectormy  Other Topics Concern  . Not on file  Social History Narrative   Lives in [place] with spouse.   Retired Nurse, learning disability   Social Determinants of Radio broadcast assistant Strain: Not on [employer] Insecurity: Not on file  Transportation Needs: Not on file  Physical Activity: Not on file  Stress: Not on file  Social Connections: Not on file  Intimate Partner Violence: Not on file    No family history on file.  ROS- All systems are reviewed and negative except as per the HPI above  Physical Exam: Vitals:   01/16/21 1110  BP: 136/88  Pulse:  68  Weight: 102.3 kg  Height: 5\' 6"  (1.676 m)   Wt Readings from Last 3 Encounters:  01/16/21 102.3 kg  12/02/20 102.5 kg  11/06/20 96.2 kg    Labs: Lab Results  Component Value Date   NA 139 11/20/2020   K 3.2 (L) 11/20/2020   CL 103 11/20/2020   CO2 23 11/20/2020   GLUCOSE 111 (H) 11/20/2020   BUN 9 11/20/2020   CREATININE 0.82 11/20/2020   CALCIUM 9.2 11/20/2020   Lab Results  Component Value Date  INR 0.91 01/05/2012   No results found for: CHOL, HDL, LDLCALC, TRIG   GEN- The patient is well appearing, alert and oriented x 3 today.   Head- normocephalic, atraumatic Eyes-  Sclera clear, conjunctiva pink Ears- hearing intact Oropharynx- clear Neck- supple, no JVP Lymph- no cervical lymphadenopathy Lungs- Clear to ausculation bilaterally, normal work of breathing Heart- Regular rate and rhythm, no murmurs, rubs or gallops, PMI not laterally displaced GI- soft, NT, ND, + BS Extremities- no clubbing, cyanosis, or edema MS- no significant deformity or atrophy Skin- no rash or lesion Psych- euthymic mood, full affect Neuro- strength and sensation are intact  EKG- NSR ar 68 bpm, pr int 160 ms, qrs int 90 ms, qtc 467 ms   Echo pending   Epic records reviewed   Assessment and Plan: 1. Paroxysmal afib General education re afib and triggers discussed   Continue Toprol 100 mg qd RX 30 mg cardizem to use for breakthrough afib, instructions on how to use   2. CHA2DS2VASc score of 3 Continue xarelto 20 mg  daily  Cbc/bmet today   3. Pending colonoscopy/endoscopy 03/06/21 with Dr. Therisa Doyne Pending  normal echo tomorrow, she should be at low risk for a low risk procedure She has been instructed to take Toprol with sip of water am of procedure to prevent rebound tachycardia /HTN  She can hold xarelto 2 days prior to procedure as already addressed by Nehemiah Massed, [place]  I will see in f/u in 3 months   Butch Penny C. Eliu Batch, [facility] [address] [place],  [address] [phone]

## 2021-01-16 NOTE — Telephone Encounter (Signed)
Pending colonoscopy/endoscopy 03/06/21 with Dr. Therisa Doyne Pending  normal echo tomorrow, she should be at low risk for a low risk procedure She has been instructed to take Toprol with sip of water am of procedure to prevent rebound tachycardia /HTN  She can hold xarelto 2 days prior to procedure as already addressed by Nehemiah Massed, RPH.  Have faxed recommendations to [facility]. Will forward echo when resulted scheduled for 2/25

## 2021-01-17 ENCOUNTER — Ambulatory Visit (HOSPITAL_COMMUNITY): Payer: Medicare Other | Attending: Internal Medicine

## 2021-01-17 DIAGNOSIS — I4891 Unspecified atrial fibrillation: Secondary | ICD-10-CM | POA: Insufficient documentation

## 2021-01-17 MED ORDER — PERFLUTREN LIPID MICROSPHERE
1.0000 mL | INTRAVENOUS | Status: AC | PRN
  Administered 2021-01-17: 3 mL via INTRAVENOUS

## 2021-01-17 NOTE — Progress Notes (Signed)
Will have the office reach out to her to see if an earlier date can be arranged for EGD and colon.

## 2021-01-18 LAB — ECHOCARDIOGRAM COMPLETE
AR max vel: 1.43 cm2
AV Area VTI: 1.54 cm2
AV Area mean vel: 1.63 cm2
AV Mean grad: 8.3 mmHg
AV Peak grad: 17.8 mmHg
Ao pk vel: 2.11 m/s
Area-P 1/2: 3.77 cm2
S' Lateral: 3.6 cm

## 2021-01-22 DIAGNOSIS — M4316 Spondylolisthesis, lumbar region: Secondary | ICD-10-CM | POA: Diagnosis not present

## 2021-01-22 DIAGNOSIS — I10 Essential (primary) hypertension: Secondary | ICD-10-CM | POA: Diagnosis not present

## 2021-01-22 DIAGNOSIS — Z6836 Body mass index (BMI) 36.0-36.9, adult: Secondary | ICD-10-CM | POA: Diagnosis not present

## 2021-01-23 ENCOUNTER — Telehealth: Payer: Self-pay | Admitting: *Deleted

## 2021-01-23 NOTE — Telephone Encounter (Signed)
   [facility] Pre-operative Risk Assessment    HEARTCARE STAFF: - Please ensure there is not already an duplicate clearance open for this procedure. - Under Visit Info/Reason for Call, type in Other and utilize the format Clearance MM/DD/YY or Clearance TBD. Do not use dashes or single digits. - If request is for dental extraction, please clarify the # of teeth to be extracted.  Request for surgical clearance:  1. What type of surgery is being performed? L5-S1 POSTERIOR LUMBAR INTERBODY FUSION   2. When is this surgery scheduled? TBD   3. What type of clearance is required (medical clearance vs. Pharmacy clearance to hold med vs. Both)? BOTH  4. Are there any medications that need to be held prior to surgery and how long? [place]   5. Practice name and name of physician performing surgery? [facility]; DR. Kristeen Miss   6. What is the office phone number? [phone]   7.   What is the office fax number? [place] [place]: JESSICA  8.   Anesthesia type (None, local, MAC, general) ? GENERAL   Julaine Hua 01/23/2021, 2:20 PM  _________________________________________________________________   (provider comments below)

## 2021-01-24 NOTE — Telephone Encounter (Signed)
Patient with diagnosis of atrial fibrillation on Xarelto for anticoagulation.    Procedure: L5-S1 posterior lumbar interbody fusion Date of procedure: TBD   CHA2DS2-VASc Score = 3  This indicates a 3.2% annual risk of stroke. The patient's score is based upon: CHF History: No HTN History: Yes Diabetes History: No Stroke History: No Vascular Disease History: No Age Score: 1 Gender Score: 1  CrCl 106.6 Platelet count 214  Per office protocol, patient can hold Xarelto for 3 days prior to procedure.    Patient will not need bridging with Lovenox (enoxaparin) around procedure.

## 2021-01-24 NOTE — Telephone Encounter (Signed)
   Primary Electrophysiologist: Dr. Rayann Heman  Chart reviewed as part of pre-operative protocol coverage. Given past medical history and time since last visit, based on ACC/AHA guidelines, SHERLEEN PANGBORN would be at acceptable risk for the planned procedure without further cardiovascular testing.   Per pharmacy review she may hold Xarelto 3 days prior and does not need bridging with Lovenox.   I will route this recommendation to the requesting party via Epic fax function and remove from pre-op pool.  Please call with questions.  Loel Dubonnet, NP 01/24/2021, 1:37 PM

## 2021-01-28 ENCOUNTER — Other Ambulatory Visit: Payer: Self-pay | Admitting: Neurological Surgery

## 2021-02-03 ENCOUNTER — Telehealth: Payer: Self-pay | Admitting: Internal Medicine

## 2021-02-03 NOTE — Telephone Encounter (Signed)
The patient has been notified of the result and verbalized understanding.  All questions (if any) were answered. Darrell Jewel, RN 02/03/2021 5:01 PM

## 2021-02-03 NOTE — Telephone Encounter (Signed)
Patient calling for echo results 

## 2021-02-10 DIAGNOSIS — Z01812 Encounter for preprocedural laboratory examination: Secondary | ICD-10-CM | POA: Diagnosis not present

## 2021-03-17 DIAGNOSIS — H2513 Age-related nuclear cataract, bilateral: Secondary | ICD-10-CM | POA: Diagnosis not present

## 2021-03-17 DIAGNOSIS — H47322 Drusen of optic disc, left eye: Secondary | ICD-10-CM | POA: Diagnosis not present

## 2021-03-17 DIAGNOSIS — H43811 Vitreous degeneration, right eye: Secondary | ICD-10-CM | POA: Diagnosis not present

## 2021-03-17 DIAGNOSIS — H43392 Other vitreous opacities, left eye: Secondary | ICD-10-CM | POA: Diagnosis not present

## 2021-03-21 ENCOUNTER — Encounter (HOSPITAL_COMMUNITY): Payer: Self-pay

## 2021-03-21 NOTE — Progress Notes (Signed)
Surgical Instructions    Your procedure is scheduled on 03/27/2021.  Report to [facility] Main Entrance "A" at 05:30 A.M., then check in with the Admitting office.  Call this number if you have problems the morning of surgery:  [phone]   If you have any questions prior to your surgery date call [phone]: Open Monday-Friday 8am-4pm    Remember:  Do not eat or drink after midnight the night before your surgery     Take these medicines the morning of surgery with A SIP OF WATER  acetaminophen (TYLENOL) as needed diltiazem (CARDIZEM) if needed DULoxetine (CYMBALTA) levothyroxine (SYNTHROID) metoprolol succinate (TOPROL-XL)  omeprazole (PRILOSEC OTC) tamoxifen (NOLVADEX)  As of today, STOP taking any Aspirin (unless otherwise instructed by your surgeon) Aleve, Naproxen, Ibuprofen, Motrin, Advil, Goody's, BC's, all herbal medications, fish oil, and all vitamins. Please hold rivaroxaban (XARELTO) 3 days prior to surgery. Your last dose will be 03/23/21.                      Do not wear jewelry, make up, or nail polish            Do not wear lotions, powders, perfumes/colognes, or deodorant.            Do not shave 48 hours prior to surgery.              Do not bring valuables to the hospital.            [facility] is not responsible for any belongings or valuables.  Do NOT Smoke (Tobacco/Vaping) or drink Alcohol 24 hours prior to your procedure If you use a CPAP at night, you may bring all equipment for your overnight stay.   Contacts, glasses, dentures or bridgework may not be worn into surgery, please bring cases for these belongings   For patients admitted to the hospital, discharge time will be determined by your treatment team.   Patients discharged the day of surgery will not be allowed to drive home, and someone needs to stay with them for 24 hours.    Special instructions:   [place]- Preparing For Surgery  Before surgery, you can play an important role.  Because skin is not sterile, your skin needs to be as free of germs as possible. You can reduce the number of germs on your skin by washing with CHG (chlorahexidine gluconate) Soap before surgery.  CHG is an antiseptic cleaner which kills germs and bonds with the skin to continue killing germs even after washing.    Oral Hygiene is also important to reduce your risk of infection.  Remember - BRUSH YOUR TEETH THE MORNING OF SURGERY WITH YOUR REGULAR TOOTHPASTE  Please do not use if you have an allergy to CHG or antibacterial soaps. If your skin becomes reddened/irritated stop using the CHG.  Do not shave (including legs and underarms) for at least 48 hours prior to first CHG shower. It is OK to shave your face.  Please follow these instructions carefully.   1. Shower the NIGHT BEFORE SURGERY and the MORNING OF SURGERY  2. If you chose to wash your hair, wash your hair first as usual with your normal shampoo.  3. After you shampoo, rinse your hair and body thoroughly to remove the shampoo.  4. Wash Face and genitals (private parts) with your normal soap.   5.  Shower the NIGHT BEFORE SURGERY and the MORNING OF SURGERY with CHG Soap.   6. Use  CHG Soap as you would any other liquid soap. You can apply CHG directly to the skin and wash gently with a scrungie or a clean washcloth.   7. Apply the CHG Soap to your body ONLY FROM THE NECK DOWN.  Do not use on open wounds or open sores. Avoid contact with your eyes, ears, mouth and genitals (private parts). Wash Face and genitals (private parts)  with your normal soap.   8. Wash thoroughly, paying special attention to the area where your surgery will be performed.  9. Thoroughly rinse your body with warm water from the neck down.  10. DO NOT shower/wash with your normal soap after using and rinsing off the CHG Soap.  11. Pat yourself dry with a CLEAN TOWEL.  12. Wear CLEAN PAJAMAS to bed the night before surgery  13. Place CLEAN SHEETS on your  bed the night before your surgery  14. DO NOT SLEEP WITH PETS.   Day of Surgery: Take a shower with CHG soap. Wear Clean/Comfortable clothing the morning of surgery Do not apply any deodorants/lotions.   Remember to brush your teeth WITH YOUR REGULAR TOOTHPASTE.   Please read over the following fact sheets that you were given.

## 2021-03-21 NOTE — Progress Notes (Incomplete)
PCP - *** Cardiologist - Dr Thompson Grayer  Chest x-ray - 11/20/20 (2V) EKG - 01/16/21 Stress Test - n/a ECHO - 01/17/21 Cardiac Cath - n/a  Blood Thinner Instructions:  Follow your surgeon's instructions on when to stop Xarelto prior to surgery.  Last dose of xarelto was on   Anesthesia review: ***  STOP now taking any Aspirin (unless otherwise instructed by your surgeon), Aleve, Naproxen, Ibuprofen, Motrin, Advil, Goody's, BC's, all herbal medications, fish oil, and all vitamins.   Coronavirus Screening Covid test at PAT appt. 03/24/21. Do you have any of the following symptoms:  Cough {yes/no:20286:::1} Fever (>100.70F)  {yes/no:20286:::1} Runny nose {yes/no:20286:::1} Sore throat {yes/no:20286:::1} Difficulty breathing/shortness of breath  {yes/no:20286:::1}  Have you traveled in the last 14 days and where? {yes/no:20286:::1}  Patient verbalized understanding of instructions that were given to them at the PAT appointment.

## 2021-03-24 ENCOUNTER — Encounter (HOSPITAL_COMMUNITY): Payer: Self-pay

## 2021-03-24 ENCOUNTER — Encounter (HOSPITAL_COMMUNITY)
Admission: RE | Admit: 2021-03-24 | Discharge: 2021-03-24 | Disposition: A | Discharge status: Home / Self Care | Payer: Medicare Other | Source: Ambulatory Visit | Attending: Neurological Surgery | Admitting: Neurological Surgery

## 2021-03-24 ENCOUNTER — Other Ambulatory Visit: Payer: Self-pay

## 2021-03-24 DIAGNOSIS — M47817 Spondylosis without myelopathy or radiculopathy, lumbosacral region: Secondary | ICD-10-CM | POA: Diagnosis not present

## 2021-03-24 DIAGNOSIS — E039 Hypothyroidism, unspecified: Secondary | ICD-10-CM | POA: Diagnosis not present

## 2021-03-24 DIAGNOSIS — F419 Anxiety disorder, unspecified: Secondary | ICD-10-CM | POA: Diagnosis not present

## 2021-03-24 DIAGNOSIS — Z79899 Other long term (current) drug therapy: Secondary | ICD-10-CM | POA: Diagnosis not present

## 2021-03-24 DIAGNOSIS — F32A Depression, unspecified: Secondary | ICD-10-CM | POA: Diagnosis not present

## 2021-03-24 DIAGNOSIS — Z7901 Long term (current) use of anticoagulants: Secondary | ICD-10-CM | POA: Diagnosis not present

## 2021-03-24 DIAGNOSIS — M4326 Fusion of spine, lumbar region: Secondary | ICD-10-CM | POA: Diagnosis not present

## 2021-03-24 DIAGNOSIS — M5117 Intervertebral disc disorders with radiculopathy, lumbosacral region: Secondary | ICD-10-CM | POA: Diagnosis not present

## 2021-03-24 DIAGNOSIS — Z79890 Hormone replacement therapy (postmenopausal): Secondary | ICD-10-CM | POA: Diagnosis not present

## 2021-03-24 DIAGNOSIS — D649 Anemia, unspecified: Secondary | ICD-10-CM | POA: Diagnosis not present

## 2021-03-24 DIAGNOSIS — E7800 Pure hypercholesterolemia, unspecified: Secondary | ICD-10-CM | POA: Diagnosis not present

## 2021-03-24 DIAGNOSIS — Z20822 Contact with and (suspected) exposure to covid-19: Secondary | ICD-10-CM | POA: Insufficient documentation

## 2021-03-24 DIAGNOSIS — M4727 Other spondylosis with radiculopathy, lumbosacral region: Secondary | ICD-10-CM | POA: Diagnosis not present

## 2021-03-24 DIAGNOSIS — M5416 Radiculopathy, lumbar region: Secondary | ICD-10-CM | POA: Diagnosis not present

## 2021-03-24 DIAGNOSIS — Z01812 Encounter for preprocedural laboratory examination: Secondary | ICD-10-CM | POA: Insufficient documentation

## 2021-03-24 DIAGNOSIS — Z853 Personal history of malignant neoplasm of breast: Secondary | ICD-10-CM | POA: Diagnosis not present

## 2021-03-24 DIAGNOSIS — M4807 Spinal stenosis, lumbosacral region: Secondary | ICD-10-CM | POA: Diagnosis not present

## 2021-03-24 DIAGNOSIS — I10 Essential (primary) hypertension: Secondary | ICD-10-CM | POA: Diagnosis not present

## 2021-03-24 DIAGNOSIS — K219 Gastro-esophageal reflux disease without esophagitis: Secondary | ICD-10-CM | POA: Diagnosis not present

## 2021-03-24 DIAGNOSIS — E785 Hyperlipidemia, unspecified: Secondary | ICD-10-CM | POA: Diagnosis not present

## 2021-03-24 DIAGNOSIS — M4317 Spondylolisthesis, lumbosacral region: Secondary | ICD-10-CM | POA: Diagnosis not present

## 2021-03-24 DIAGNOSIS — Z96653 Presence of artificial knee joint, bilateral: Secondary | ICD-10-CM | POA: Diagnosis not present

## 2021-03-24 DIAGNOSIS — M5030 Other cervical disc degeneration, unspecified cervical region: Secondary | ICD-10-CM | POA: Diagnosis not present

## 2021-03-24 DIAGNOSIS — M4316 Spondylolisthesis, lumbar region: Secondary | ICD-10-CM | POA: Diagnosis not present

## 2021-03-24 DIAGNOSIS — Z981 Arthrodesis status: Secondary | ICD-10-CM | POA: Diagnosis not present

## 2021-03-24 HISTORY — DX: Dyspnea, unspecified: R06.00

## 2021-03-24 LAB — CBC
HCT: 33.7 % — ABNORMAL LOW (ref 36.0–46.0)
Hemoglobin: 10.1 g/dL — ABNORMAL LOW (ref 12.0–15.0)
MCH: 26.6 pg (ref 26.0–34.0)
MCHC: 30 g/dL (ref 30.0–36.0)
MCV: 88.7 fL (ref 80.0–100.0)
Platelets: 224 10*3/uL (ref 150–400)
RBC: 3.8 MIL/uL — ABNORMAL LOW (ref 3.87–5.11)
RDW: 16.2 % — ABNORMAL HIGH (ref 11.5–15.5)
WBC: 3.9 10*3/uL — ABNORMAL LOW (ref 4.0–10.5)
nRBC: 0 % (ref 0.0–0.2)

## 2021-03-24 LAB — TYPE AND SCREEN
ABO/RH(D): A NEG
Antibody Screen: NEGATIVE

## 2021-03-24 LAB — BASIC METABOLIC PANEL
Anion gap: 9 (ref 5–15)
BUN: 13 mg/dL (ref 8–23)
CO2: 27 mmol/L (ref 22–32)
Calcium: 8.8 mg/dL — ABNORMAL LOW (ref 8.9–10.3)
Chloride: 101 mmol/L (ref 98–111)
Creatinine, Ser: 0.72 mg/dL (ref 0.44–1.00)
GFR, Estimated: >60 mL/min (ref >60)
Glucose, Bld: 96 mg/dL (ref 70–99)
Potassium: 3.7 mmol/L (ref 3.5–5.1)
Sodium: 137 mmol/L (ref 135–145)

## 2021-03-24 LAB — SURGICAL PCR SCREEN
MRSA, PCR: NEGATIVE
Staphylococcus aureus: POSITIVE — AB

## 2021-03-24 LAB — SARS CORONAVIRUS 2 (TAT 6-24 HRS): SARS Coronavirus 2: NEGATIVE

## 2021-03-24 NOTE — Progress Notes (Signed)
PCP: [name], MD Cardiologist: Thompson Grayer, MD  EKG: 01/16/21 CXR: 11/20/20 ECHO: 01/17/21 Stress Test: 07/13/2008 Cardiac Cath: >20 years  Fasting Blood Sugar- na Checks Blood Sugar_na__ times a day  OSA/CPAP: No  ASA: No Xarelto: Last dose 03/23/21  Covid test 03/24/21  Anesthesia Review: Yes, afib   Patient denies shortness of breath, fever, cough, and chest pain at PAT appointment.  Patient verbalized understanding of instructions provided today at the PAT appointment.  Patient asked to review instructions at home and day of surgery.

## 2021-03-25 NOTE — Progress Notes (Signed)
Anesthesia Chart Review:  Case: [id number] Date/Time: 03/27/21 0715   Procedure: Lumbar 5 Sacral 1 Posterior lumbar interbody fusion with pedicle fixation to pelvis (N/A Back) - 3C/RM 21   Anesthesia type: General   Pre-op diagnosis: Spondylolisthesis, Lumbar region   Location: [facility] OR ROOM 20 / [facility]   Surgeons: Kristeen Miss, MD      DISCUSSION: Patient is a 65 year old female scheduled for the above procedure.  History includes never smoker, post-operative N/V, HTN, hypercholesterolemia, hiatal hernia, GERD, PAF (presented for EGD 11/20/20 with afib), dyspnea, anxiety, tremor, hypothyroidism, back surgery (L4-5 foraminotomy, excision of synovial cyst 08/08/04; L4-5 PLIF 07/04/19; L2-4 anterolateral fusion 11/21/19), neck fusion, right breast lumpectomy (12/15/16; pathology: ductal papilloma, fibrocystic changes, focal atypical lobular hyperplasia; on prophylactic Tamoxifen for atypical lobular hyperplasia). BMI is consistent with obesity.   Preoperative cardiology input outlined on 01/24/21 by Laurann Montana, NP, "Given past medical history and time since last visit, based on ACC/AHA guidelines, Kathryn [name] would be at acceptable risk for the planned procedure without further cardiovascular testing.   Per pharmacy review she may hold Xarelto 3 days prior and does not need bridging with Lovenox." Per patient, last Xarelto 03/23/21.   03/24/2021 presurgical COVID-19 test negative.  Anesthesia team to evaluate on the day of surgery.   VS: BP 139/81   Pulse 70   Temp 37 C (Oral)   Resp 20   Ht 5\' 6"  (1.676 m)   Wt 100.3 kg   LMP  (LMP Unknown)   SpO2 98%   BMI 35.69 kg/m    PROVIDERS: [name], MD is PCP Thompson Grayer, MD is EP cardiologist. Also seen by [name], NP at the [facility], last visit 01/16/21. Patient was planning for EGD/colonscopy by Dr. Therisa Doyne at that time. Felt to be low risk pending no significant findings on echo. 3 month follow-up planned.   LABS: Labs  reviewed: Acceptable for surgery. H/H 10.1/33.7, but similar to 01/16/21 results. T&S done with labs.  (all labs ordered are listed, but only abnormal results are displayed)  Labs Reviewed  SURGICAL PCR SCREEN - Abnormal; Notable for the following components:      Result Value   Staphylococcus aureus POSITIVE (*)    All other components within normal limits  CBC - Abnormal; Notable for the following components:   WBC 3.9 (*)    RBC 3.80 (*)    Hemoglobin 10.1 (*)    HCT 33.7 (*)    RDW 16.2 (*)    All other components within normal limits  BASIC METABOLIC PANEL - Abnormal; Notable for the following components:   Calcium 8.8 (*)    All other components within normal limits  SARS CORONAVIRUS 2 (TAT 6-24 HRS)  TYPE AND SCREEN     IMAGES: CXR 11/20/20: FINDINGS: Cardiomegaly without edema. Lungs clear. No pneumothorax or pleural fluid. Aortic atherosclerosis. No acute bony abnormality. The patient is status post L1 vertebral augmentation. There is partial visualization of lumbar fusion hardware. IMPRESSION: No acute disease. Cardiomegaly. Aortic Atherosclerosis (ICD10-I70.0).  CT L-spine 11/06/20: IMPRESSION: 1. L2-L3 and L3-L4 lateral approach interbody fusion with no convincing arthrodesis, and evidence of mild anterior L3 screw loosening. Furthermore, L3-L4 interbody trace vacuum phenomena and mildly abnormal motion on flexion/extension radiographs corroborate pseudoarthrosis at that level. Thecal sac patency at both levels improved since the preoperative 2020 myelogram with mild residual stenosis. Up to moderate residual left L2 and left L3 neural foraminal stenosis, improved at the latter. 2. L4-L5  decompression and fusion with developing solid interbody arthrodesis, no adverse features or stenosis. 3. Adjacent segment disease at L1-L2 with progression since 2020, mild new multifactorial spinal stenosis. Stable chronically augmented L1 compression fracture. 4.  L5-S1 with chronic disc bulging and severe facet arthropathy. Both grade 1 anterolisthesis and up to moderate bilateral L5 neural foraminal stenosis appear increased since 2020. No convincing spinal or lateral recess stenosis. 5. Mild disc bulging T9-T10 through T12-L1 without stenosis.   EKG: 01/16/21: Normal sinus rhythm Nonspecific ST abnormality Abnormal ECG the patient has converted to sinus rhythm since previous ecg Confirmed by Mertie Moores ([id number]) on 01/16/2021 9:29:08 PM   CV: Echo 01/17/21: IMPRESSIONS  1. Left ventricular ejection fraction, by estimation, is 55 to 60%. The  left ventricle has normal function. The left ventricle has no regional  wall motion abnormalities. There is moderate left ventricular hypertrophy.  Left ventricular diastolic  parameters are consistent with Grade II diastolic dysfunction  (pseudonormalization).  2. Right ventricular systolic function is normal. The right ventricular  size is normal. Tricuspid regurgitation signal is inadequate for assessing  PA pressure.  3. Left atrial size was mildly dilated.  4. The mitral valve is normal in structure. Trivial mitral valve  regurgitation. No evidence of mitral stenosis.  5. The aortic valve is abnormal. There is moderate calcification of the  aortic valve. Aortic valve regurgitation is not visualized. Mild aortic  valve sclerosis is present, with no evidence of aortic valve stenosis.  6. The inferior vena cava is normal in size with greater than 50%  respiratory variability, suggesting right atrial pressure of 3 mmHg.   Past Medical History:  Diagnosis Date  . Anxiety   . Cancer ([place]) 10/2016   Breast right,  lumpectomy,  did not require chemo/xrt  . DDD (degenerative disc disease), cervical   . Depression   . Dyspnea   . Elevated cholesterol   . GERD (gastroesophageal reflux disease)   . H/O hiatal hernia   . Hypertension   . Hypothyroidism   . Paroxysmal atrial fibrillation  ([place]) 11/20/2020  . PONV (postoperative nausea and vomiting)    felt cut with one of past knee surgeries  . Tremor    patient states "tremor has gotten better" on 06/30/19  . Wears partial dentures    upper    Past Surgical History:  Procedure Laterality Date  . ABDOMINAL HYSTERECTOMY  yrs ago  . ANTERIOR LAT LUMBAR FUSION N/A 11/21/2019   Procedure: Lumbar two-three Lumbar three-four Anterolateral lumbar interbody fusion with lateral plate;  Surgeon: Kristeen Miss, MD;  Location: [place];  Service: Neurosurgery;  Laterality: N/A;  . BREAST EXCISIONAL BIOPSY Right 11/2016   ALH and papillomas  . BREAST LUMPECTOMY WITH RADIOACTIVE SEED LOCALIZATION Right 12/15/2016   Procedure: RADIOACTIVE SEED X 2 GUIDED EXCISIONAL BREAST BIOPSY;  Surgeon: Rolm Bookbinder, MD;  Location: [place];  Service: General;  Laterality: Right;  . CARPAL TUNNEL RELEASE Bilateral yrs ago   x 2  . cervical neck fusion  yrs ago   C 3 and C 4   . COLONOSCOPY    . left knee arthroscopy   last done 2008   x 3  . left shoulder arthroscopy  yrs ago  . lower back surgery  2005  . right total knee replacement   2008  . TOTAL KNEE ARTHROPLASTY  01/14/2012   Procedure: TOTAL KNEE ARTHROPLASTY;  Surgeon: Johnn Hai, MD;  Location: [facility];  Service: Orthopedics;  Laterality: Left;  preop femoral nerve block  . UPPER GI ENDOSCOPY    . WISDOM TOOTH EXTRACTION      MEDICATIONS: . acetaminophen (TYLENOL) 500 MG tablet  . ALPRAZolam (XANAX) 0.25 MG tablet  . Calcium Carb-Cholecalciferol (CALCIUM 600 + D PO)  . Cholecalciferol (VITAMIN D3) 25 MCG (1000 UT) CAPS  . diltiazem (CARDIZEM) 30 MG tablet  . DULoxetine (CYMBALTA) 30 MG capsule  . DULoxetine (CYMBALTA) 60 MG capsule  . levothyroxine (SYNTHROID) 200 MCG tablet  . metoprolol succinate (TOPROL-XL) 100 MG 24 hr tablet  . omeprazole (PRILOSEC OTC) 20 MG tablet  . oxyCODONE-acetaminophen (PERCOCET/ROXICET) 5-325 MG tablet  . rivaroxaban (XARELTO)  20 MG TABS tablet  . tamoxifen (NOLVADEX) 20 MG tablet   No current facility-administered medications for this encounter.    Myra Gianotti, PA-C Surgical Short Stay/Anesthesiology [facility] Phone [phone] [facility] Phone [phone] 03/25/2021 4:07 PM

## 2021-03-26 NOTE — Anesthesia Preprocedure Evaluation (Addendum)
Anesthesia Evaluation  Patient identified by MRN, date of birth, ID band Patient awake    Reviewed: Allergy & Precautions, NPO status , Patient's Chart, lab work & pertinent test results, reviewed documented beta blocker date and time   History of Anesthesia Complications (+) PONV and history of anesthetic complications  Airway Mallampati: III  TM Distance: >3 FB Neck ROM: Full    Dental  (+) Dental Advisory Given, Partial Upper   Pulmonary neg pulmonary ROS,    Pulmonary exam normal        Cardiovascular hypertension, Pt. on home beta blockers and Pt. on medications (-) anginaNormal cardiovascular exam+ dysrhythmias Atrial Fibrillation    '22 TTE - EF 55 to 60%. Moderate left ventricular hypertrophy. Grade II diastolic dysfunction (pseudonormalization). LA was mildly dilated. Trivial MR. Mild aortic valve sclerosis without AS.     Neuro/Psych PSYCHIATRIC DISORDERS Anxiety Depression  ? Undiagnosed seizure d/o per patient - has seen neurology without diagnosis, described as 4-5 minute episodes of blank stares and gasping for breath, after which patient has no recall of the event, but no confusion/postictal state either      GI/Hepatic Neg liver ROS, hiatal hernia, GERD  Medicated and Controlled,  Endo/Other  Hypothyroidism  Obesity   Renal/GU negative Renal ROS     Musculoskeletal  (+) Arthritis ,   Abdominal   Peds  Hematology  (+) anemia ,  On xarelto, last dose 03/23/21     Anesthesia Other Findings Covid test negative   Reproductive/Obstetrics                            Anesthesia Physical Anesthesia Plan  ASA: III  Anesthesia Plan: General   Post-op Pain Management:    Induction: Intravenous  PONV Risk Score and Plan: 4 or greater and Treatment may vary due to age or medical condition, Ondansetron, Dexamethasone, Aprepitant and Propofol infusion  Airway Management  Planned: Oral ETT  Additional Equipment: None  Intra-op Plan:   Post-operative Plan: Extubation in OR  Informed Consent: I have reviewed the patients History and Physical, chart, labs and discussed the procedure including the risks, benefits and alternatives for the proposed anesthesia with the patient or authorized representative who has indicated his/her understanding and acceptance.     Dental advisory given  Plan Discussed with: CRNA and Anesthesiologist  Anesthesia Plan Comments:        Anesthesia Quick Evaluation

## 2021-03-27 ENCOUNTER — Inpatient Hospital Stay (HOSPITAL_COMMUNITY): Payer: Medicare Other | Admitting: Anesthesiology

## 2021-03-27 ENCOUNTER — Inpatient Hospital Stay (HOSPITAL_COMMUNITY)
Admission: RE | Disposition: A | Discharge status: Home / Self Care | Payer: Self-pay | Source: Home / Self Care | Attending: Neurological Surgery

## 2021-03-27 ENCOUNTER — Inpatient Hospital Stay (HOSPITAL_COMMUNITY): Payer: Medicare Other

## 2021-03-27 ENCOUNTER — Encounter (HOSPITAL_COMMUNITY): Payer: Self-pay | Admitting: Neurological Surgery

## 2021-03-27 ENCOUNTER — Other Ambulatory Visit: Payer: Self-pay

## 2021-03-27 ENCOUNTER — Inpatient Hospital Stay (HOSPITAL_COMMUNITY): Payer: Medicare Other | Admitting: Vascular Surgery

## 2021-03-27 ENCOUNTER — Inpatient Hospital Stay (HOSPITAL_COMMUNITY)
Admission: RE | Admit: 2021-03-27 | Discharge: 2021-03-28 | DRG: 460 | Disposition: A | Discharge status: Home / Self Care | Payer: Medicare Other | Attending: Neurological Surgery | Admitting: Neurological Surgery

## 2021-03-27 DIAGNOSIS — Z981 Arthrodesis status: Secondary | ICD-10-CM

## 2021-03-27 DIAGNOSIS — D649 Anemia, unspecified: Secondary | ICD-10-CM | POA: Diagnosis present

## 2021-03-27 DIAGNOSIS — M47817 Spondylosis without myelopathy or radiculopathy, lumbosacral region: Secondary | ICD-10-CM | POA: Diagnosis present

## 2021-03-27 DIAGNOSIS — Z20822 Contact with and (suspected) exposure to covid-19: Secondary | ICD-10-CM | POA: Diagnosis present

## 2021-03-27 DIAGNOSIS — M5416 Radiculopathy, lumbar region: Secondary | ICD-10-CM | POA: Diagnosis present

## 2021-03-27 DIAGNOSIS — Z853 Personal history of malignant neoplasm of breast: Secondary | ICD-10-CM

## 2021-03-27 DIAGNOSIS — Z79890 Hormone replacement therapy (postmenopausal): Secondary | ICD-10-CM | POA: Diagnosis not present

## 2021-03-27 DIAGNOSIS — E039 Hypothyroidism, unspecified: Secondary | ICD-10-CM | POA: Diagnosis present

## 2021-03-27 DIAGNOSIS — F32A Depression, unspecified: Secondary | ICD-10-CM | POA: Diagnosis present

## 2021-03-27 DIAGNOSIS — Z7901 Long term (current) use of anticoagulants: Secondary | ICD-10-CM | POA: Diagnosis not present

## 2021-03-27 DIAGNOSIS — Z79899 Other long term (current) drug therapy: Secondary | ICD-10-CM

## 2021-03-27 DIAGNOSIS — Z419 Encounter for procedure for purposes other than remedying health state, unspecified: Secondary | ICD-10-CM

## 2021-03-27 DIAGNOSIS — K219 Gastro-esophageal reflux disease without esophagitis: Secondary | ICD-10-CM | POA: Diagnosis present

## 2021-03-27 DIAGNOSIS — M4807 Spinal stenosis, lumbosacral region: Secondary | ICD-10-CM | POA: Diagnosis present

## 2021-03-27 DIAGNOSIS — F419 Anxiety disorder, unspecified: Secondary | ICD-10-CM | POA: Diagnosis present

## 2021-03-27 DIAGNOSIS — M5030 Other cervical disc degeneration, unspecified cervical region: Secondary | ICD-10-CM | POA: Diagnosis present

## 2021-03-27 DIAGNOSIS — M4316 Spondylolisthesis, lumbar region: Secondary | ICD-10-CM | POA: Diagnosis present

## 2021-03-27 DIAGNOSIS — M4317 Spondylolisthesis, lumbosacral region: Secondary | ICD-10-CM | POA: Diagnosis present

## 2021-03-27 DIAGNOSIS — E7800 Pure hypercholesterolemia, unspecified: Secondary | ICD-10-CM | POA: Diagnosis present

## 2021-03-27 DIAGNOSIS — Z96653 Presence of artificial knee joint, bilateral: Secondary | ICD-10-CM | POA: Diagnosis present

## 2021-03-27 DIAGNOSIS — M4326 Fusion of spine, lumbar region: Secondary | ICD-10-CM | POA: Diagnosis not present

## 2021-03-27 DIAGNOSIS — I10 Essential (primary) hypertension: Secondary | ICD-10-CM | POA: Diagnosis present

## 2021-03-27 LAB — CBC
HCT: 31.1 % — ABNORMAL LOW (ref 36.0–46.0)
Hemoglobin: 9.4 g/dL — ABNORMAL LOW (ref 12.0–15.0)
MCH: 26.2 pg (ref 26.0–34.0)
MCHC: 30.2 g/dL (ref 30.0–36.0)
MCV: 86.6 fL (ref 80.0–100.0)
Platelets: 249 10*3/uL (ref 150–400)
RBC: 3.59 MIL/uL — ABNORMAL LOW (ref 3.87–5.11)
RDW: 16.4 % — ABNORMAL HIGH (ref 11.5–15.5)
WBC: 11 10*3/uL — ABNORMAL HIGH (ref 4.0–10.5)
nRBC: 0 % (ref 0.0–0.2)

## 2021-03-27 LAB — CREATININE, SERUM
Creatinine, Ser: 0.87 mg/dL (ref 0.44–1.00)
GFR, Estimated: >60 mL/min (ref >60)

## 2021-03-27 SURGERY — POSTERIOR LUMBAR FUSION 1 LEVEL
Anesthesia: General | Site: Back

## 2021-03-27 MED ORDER — SODIUM CHLORIDE 0.9% FLUSH: 3.0000 mL | Freq: Two times a day (BID) | INTRAVENOUS | Status: DC

## 2021-03-27 MED ORDER — ONDANSETRON HCL 4 MG PO TABS: 4.0000 mg | ORAL_TABLET | Freq: Four times a day (QID) | ORAL | Status: DC | PRN

## 2021-03-27 MED ORDER — METOPROLOL SUCCINATE ER 100 MG PO TB24
100.0000 mg | ORAL_TABLET | Freq: Every day | ORAL | Status: DC
  Administered 2021-03-28: 100 mg via ORAL
  Filled 2021-03-27: qty 1

## 2021-03-27 MED ORDER — OXYCODONE HCL 5 MG PO TABS: 5.0000 mg | ORAL_TABLET | Freq: Once | ORAL | Status: DC | PRN

## 2021-03-27 MED ORDER — FENTANYL CITRATE (PF) 100 MCG/2ML IJ SOLN
25.0000 ug | INTRAMUSCULAR | Status: DC | PRN
  Administered 2021-03-27 (×2): 50 ug via INTRAVENOUS

## 2021-03-27 MED ORDER — ONDANSETRON HCL 4 MG/2ML IJ SOLN: 4.0000 mg | Freq: Four times a day (QID) | INTRAMUSCULAR | Status: DC | PRN

## 2021-03-27 MED ORDER — FENTANYL CITRATE (PF) 250 MCG/5ML IJ SOLN
INTRAMUSCULAR | Status: DC | PRN
  Administered 2021-03-27: 100 ug via INTRAVENOUS
  Administered 2021-03-27 (×4): 50 ug via INTRAVENOUS

## 2021-03-27 MED ORDER — ACETAMINOPHEN 650 MG RE SUPP: 650.0000 mg | RECTAL | Status: DC | PRN

## 2021-03-27 MED ORDER — LIDOCAINE-EPINEPHRINE 1 %-1:100000 IJ SOLN
INTRAMUSCULAR | Status: AC
  Filled 2021-03-27: qty 1

## 2021-03-27 MED ORDER — PHENYLEPHRINE 40 MCG/ML (10ML) SYRINGE FOR IV PUSH (FOR BLOOD PRESSURE SUPPORT)
PREFILLED_SYRINGE | INTRAVENOUS | Status: DC | PRN
  Administered 2021-03-27 (×2): 80 ug via INTRAVENOUS

## 2021-03-27 MED ORDER — LACTATED RINGERS IV SOLN: INTRAVENOUS | Status: DC

## 2021-03-27 MED ORDER — CEFAZOLIN SODIUM-DEXTROSE 2-4 GM/100ML-% IV SOLN
2.0000 g | Freq: Three times a day (TID) | INTRAVENOUS | Status: AC
  Administered 2021-03-27 (×2): 2 g via INTRAVENOUS
  Filled 2021-03-27 (×2): qty 100

## 2021-03-27 MED ORDER — ROCURONIUM BROMIDE 10 MG/ML (PF) SYRINGE
PREFILLED_SYRINGE | INTRAVENOUS | Status: AC
  Filled 2021-03-27: qty 10

## 2021-03-27 MED ORDER — PROPOFOL 10 MG/ML IV BOLUS
INTRAVENOUS | Status: DC | PRN
  Administered 2021-03-27: 120 mg via INTRAVENOUS

## 2021-03-27 MED ORDER — ENOXAPARIN SODIUM 40 MG/0.4ML IJ SOSY
40.0000 mg | PREFILLED_SYRINGE | INTRAMUSCULAR | Status: DC
  Administered 2021-03-28: 40 mg via SUBCUTANEOUS
  Filled 2021-03-27: qty 0.4

## 2021-03-27 MED ORDER — PANTOPRAZOLE SODIUM 20 MG PO TBEC
20.0000 mg | DELAYED_RELEASE_TABLET | Freq: Every day | ORAL | Status: DC
  Administered 2021-03-27 – 2021-03-28 (×2): 20 mg via ORAL
  Filled 2021-03-27 (×2): qty 1

## 2021-03-27 MED ORDER — THROMBIN 5000 UNITS EX SOLR
CUTANEOUS | Status: AC
  Filled 2021-03-27: qty 5000

## 2021-03-27 MED ORDER — CHLORHEXIDINE GLUCONATE 0.12 % MT SOLN
15.0000 mL | Freq: Once | OROMUCOSAL | Status: AC
  Administered 2021-03-27: 15 mL via OROMUCOSAL
  Filled 2021-03-27: qty 15

## 2021-03-27 MED ORDER — DILTIAZEM HCL 30 MG PO TABS
30.0000 mg | ORAL_TABLET | ORAL | Status: DC | PRN
  Filled 2021-03-27: qty 1

## 2021-03-27 MED ORDER — APREPITANT 40 MG PO CAPS: 40.0000 mg | ORAL_CAPSULE | Freq: Once | ORAL | Status: DC

## 2021-03-27 MED ORDER — PHENOL 1.4 % MT LIQD: 1.0000 | OROMUCOSAL | Status: DC | PRN

## 2021-03-27 MED ORDER — FENTANYL CITRATE (PF) 250 MCG/5ML IJ SOLN
INTRAMUSCULAR | Status: AC
  Filled 2021-03-27: qty 5

## 2021-03-27 MED ORDER — SODIUM CHLORIDE 0.9% FLUSH: 3.0000 mL | INTRAVENOUS | Status: DC | PRN

## 2021-03-27 MED ORDER — HYDRALAZINE HCL 20 MG/ML IJ SOLN
10.0000 mg | Freq: Once | INTRAMUSCULAR | Status: AC
  Administered 2021-03-27: 10 mg via INTRAVENOUS

## 2021-03-27 MED ORDER — FENTANYL CITRATE (PF) 100 MCG/2ML IJ SOLN
INTRAMUSCULAR | Status: AC
  Filled 2021-03-27: qty 2

## 2021-03-27 MED ORDER — ALPRAZOLAM 0.25 MG PO TABS
0.2500 mg | ORAL_TABLET | Freq: Every day | ORAL | Status: DC
  Administered 2021-03-27: 0.25 mg via ORAL
  Filled 2021-03-27: qty 1

## 2021-03-27 MED ORDER — PHENYLEPHRINE 40 MCG/ML (10ML) SYRINGE FOR IV PUSH (FOR BLOOD PRESSURE SUPPORT)
PREFILLED_SYRINGE | INTRAVENOUS | Status: AC
  Filled 2021-03-27: qty 10

## 2021-03-27 MED ORDER — MORPHINE SULFATE (PF) 2 MG/ML IV SOLN
2.0000 mg | INTRAVENOUS | Status: DC | PRN
  Administered 2021-03-27: 4 mg via INTRAVENOUS
  Administered 2021-03-27: 2 mg via INTRAVENOUS
  Filled 2021-03-27 (×2): qty 2

## 2021-03-27 MED ORDER — MENTHOL 3 MG MT LOZG: 1.0000 | LOZENGE | OROMUCOSAL | Status: DC | PRN

## 2021-03-27 MED ORDER — ROCURONIUM BROMIDE 10 MG/ML (PF) SYRINGE
PREFILLED_SYRINGE | INTRAVENOUS | Status: DC | PRN
  Administered 2021-03-27: 20 mg via INTRAVENOUS
  Administered 2021-03-27: 60 mg via INTRAVENOUS
  Administered 2021-03-27: 30 mg via INTRAVENOUS
  Administered 2021-03-27: 40 mg via INTRAVENOUS

## 2021-03-27 MED ORDER — OXYCODONE HCL 5 MG/5ML PO SOLN: 5.0000 mg | Freq: Once | ORAL | Status: DC | PRN

## 2021-03-27 MED ORDER — DULOXETINE HCL 30 MG PO CPEP: 60.0000 mg | ORAL_CAPSULE | Freq: Every day | ORAL | Status: DC

## 2021-03-27 MED ORDER — TAMOXIFEN CITRATE 10 MG PO TABS
20.0000 mg | ORAL_TABLET | Freq: Every day | ORAL | Status: DC
  Administered 2021-03-27 – 2021-03-28 (×2): 20 mg via ORAL
  Filled 2021-03-27 (×2): qty 2

## 2021-03-27 MED ORDER — EPHEDRINE 5 MG/ML INJ
INTRAVENOUS | Status: AC
  Filled 2021-03-27: qty 10

## 2021-03-27 MED ORDER — POLYETHYLENE GLYCOL 3350 17 G PO PACK: 17.0000 g | PACK | Freq: Every day | ORAL | Status: DC | PRN

## 2021-03-27 MED ORDER — METHOCARBAMOL 500 MG PO TABS
500.0000 mg | ORAL_TABLET | Freq: Four times a day (QID) | ORAL | Status: DC | PRN
  Administered 2021-03-27 – 2021-03-28 (×3): 500 mg via ORAL
  Filled 2021-03-27 (×3): qty 1

## 2021-03-27 MED ORDER — EPHEDRINE SULFATE-NACL 50-0.9 MG/10ML-% IV SOSY
PREFILLED_SYRINGE | INTRAVENOUS | Status: DC | PRN
  Administered 2021-03-27: 10 mg via INTRAVENOUS
  Administered 2021-03-27 (×3): 5 mg via INTRAVENOUS

## 2021-03-27 MED ORDER — EPHEDRINE SULFATE 50 MG/ML IJ SOLN: INTRAMUSCULAR | Status: DC | PRN

## 2021-03-27 MED ORDER — CHLORHEXIDINE GLUCONATE CLOTH 2 % EX PADS: 6.0000 | MEDICATED_PAD | Freq: Once | CUTANEOUS | Status: DC

## 2021-03-27 MED ORDER — ONDANSETRON HCL 4 MG/2ML IJ SOLN
INTRAMUSCULAR | Status: DC | PRN
  Administered 2021-03-27: 4 mg via INTRAVENOUS

## 2021-03-27 MED ORDER — ONDANSETRON HCL 4 MG/2ML IJ SOLN
INTRAMUSCULAR | Status: AC
  Filled 2021-03-27: qty 2

## 2021-03-27 MED ORDER — OXYCODONE-ACETAMINOPHEN 5-325 MG PO TABS
1.0000 | ORAL_TABLET | ORAL | Status: DC | PRN
  Administered 2021-03-27 – 2021-03-28 (×5): 2 via ORAL
  Filled 2021-03-27 (×5): qty 2

## 2021-03-27 MED ORDER — 0.9 % SODIUM CHLORIDE (POUR BTL) OPTIME
TOPICAL | Status: DC | PRN
  Administered 2021-03-27: 1000 mL

## 2021-03-27 MED ORDER — FLEET ENEMA 7-19 GM/118ML RE ENEM: 1.0000 | ENEMA | Freq: Once | RECTAL | Status: DC | PRN

## 2021-03-27 MED ORDER — PROPOFOL 10 MG/ML IV BOLUS
INTRAVENOUS | Status: AC
  Filled 2021-03-27: qty 20

## 2021-03-27 MED ORDER — LIDOCAINE 2% (20 MG/ML) 5 ML SYRINGE
INTRAMUSCULAR | Status: AC
  Filled 2021-03-27: qty 5

## 2021-03-27 MED ORDER — SUGAMMADEX SODIUM 200 MG/2ML IV SOLN
INTRAVENOUS | Status: DC | PRN
  Administered 2021-03-27: 200 mg via INTRAVENOUS

## 2021-03-27 MED ORDER — BISACODYL 10 MG RE SUPP: 10.0000 mg | Freq: Every day | RECTAL | Status: DC | PRN

## 2021-03-27 MED ORDER — LEVOTHYROXINE SODIUM 100 MCG PO TABS
200.0000 ug | ORAL_TABLET | Freq: Every day | ORAL | Status: DC
  Administered 2021-03-28: 200 ug via ORAL
  Filled 2021-03-27: qty 2

## 2021-03-27 MED ORDER — DEXAMETHASONE SODIUM PHOSPHATE 10 MG/ML IJ SOLN
INTRAMUSCULAR | Status: AC
  Filled 2021-03-27: qty 1

## 2021-03-27 MED ORDER — THROMBIN 20000 UNITS EX SOLR
CUTANEOUS | Status: AC
  Filled 2021-03-27: qty 20000

## 2021-03-27 MED ORDER — LIDOCAINE-EPINEPHRINE 1 %-1:100000 IJ SOLN
INTRAMUSCULAR | Status: DC | PRN
  Administered 2021-03-27: 5 mL

## 2021-03-27 MED ORDER — METHOCARBAMOL 1000 MG/10ML IJ SOLN
500.0000 mg | Freq: Four times a day (QID) | INTRAVENOUS | Status: DC | PRN
  Filled 2021-03-27: qty 5

## 2021-03-27 MED ORDER — THROMBIN 5000 UNITS EX SOLR
OROMUCOSAL | Status: DC | PRN
  Administered 2021-03-27 (×3): 5 mL via TOPICAL

## 2021-03-27 MED ORDER — FENTANYL CITRATE (PF) 100 MCG/2ML IJ SOLN: 50.0000 ug | Freq: Once | INTRAMUSCULAR | Status: AC

## 2021-03-27 MED ORDER — CEFAZOLIN SODIUM-DEXTROSE 2-4 GM/100ML-% IV SOLN
2.0000 g | INTRAVENOUS | Status: AC
  Administered 2021-03-27: 2 g via INTRAVENOUS
  Filled 2021-03-27: qty 100

## 2021-03-27 MED ORDER — PHENYLEPHRINE HCL-NACL 10-0.9 MG/250ML-% IV SOLN
INTRAVENOUS | Status: DC | PRN
  Administered 2021-03-27: 20 ug/min via INTRAVENOUS

## 2021-03-27 MED ORDER — LIDOCAINE 2% (20 MG/ML) 5 ML SYRINGE
INTRAMUSCULAR | Status: DC | PRN
  Administered 2021-03-27: 60 mg via INTRAVENOUS

## 2021-03-27 MED ORDER — BUPIVACAINE HCL (PF) 0.5 % IJ SOLN
INTRAMUSCULAR | Status: DC | PRN
  Administered 2021-03-27: 20 mL
  Administered 2021-03-27: 5 mL

## 2021-03-27 MED ORDER — BUPIVACAINE HCL (PF) 0.5 % IJ SOLN
INTRAMUSCULAR | Status: AC
  Filled 2021-03-27: qty 30

## 2021-03-27 MED ORDER — PROPOFOL 500 MG/50ML IV EMUL
INTRAVENOUS | Status: DC | PRN
  Administered 2021-03-27: 25 ug/kg/min via INTRAVENOUS

## 2021-03-27 MED ORDER — ALUM & MAG HYDROXIDE-SIMETH 200-200-20 MG/5ML PO SUSP: 30.0000 mL | Freq: Four times a day (QID) | ORAL | Status: DC | PRN

## 2021-03-27 MED ORDER — ORAL CARE MOUTH RINSE: 15.0000 mL | Freq: Once | OROMUCOSAL | Status: AC

## 2021-03-27 MED ORDER — DULOXETINE HCL 30 MG PO CPEP: 30.0000 mg | ORAL_CAPSULE | Freq: Every day | ORAL | Status: DC

## 2021-03-27 MED ORDER — PROPOFOL 1000 MG/100ML IV EMUL
INTRAVENOUS | Status: AC
  Filled 2021-03-27: qty 100

## 2021-03-27 MED ORDER — HYDRALAZINE HCL 20 MG/ML IJ SOLN
INTRAMUSCULAR | Status: AC
  Filled 2021-03-27: qty 1

## 2021-03-27 MED ORDER — ONDANSETRON HCL 4 MG/2ML IJ SOLN
4.0000 mg | Freq: Once | INTRAMUSCULAR | Status: AC | PRN
  Administered 2021-03-27: 4 mg via INTRAVENOUS

## 2021-03-27 MED ORDER — SENNA 8.6 MG PO TABS
1.0000 | ORAL_TABLET | Freq: Two times a day (BID) | ORAL | Status: DC
  Administered 2021-03-27 – 2021-03-28 (×2): 8.6 mg via ORAL
  Filled 2021-03-27 (×2): qty 1

## 2021-03-27 MED ORDER — SODIUM CHLORIDE 0.9 % IV SOLN: 250.0000 mL | INTRAVENOUS | Status: DC

## 2021-03-27 MED ORDER — THROMBIN 20000 UNITS EX SOLR
CUTANEOUS | Status: DC | PRN
  Administered 2021-03-27: 20 mL via TOPICAL

## 2021-03-27 MED ORDER — DOCUSATE SODIUM 100 MG PO CAPS
100.0000 mg | ORAL_CAPSULE | Freq: Two times a day (BID) | ORAL | Status: DC
  Administered 2021-03-27 – 2021-03-28 (×2): 100 mg via ORAL
  Filled 2021-03-27 (×2): qty 1

## 2021-03-27 MED ORDER — DULOXETINE HCL 30 MG PO CPEP
90.0000 mg | ORAL_CAPSULE | Freq: Every day | ORAL | Status: DC
  Administered 2021-03-27: 90 mg via ORAL
  Filled 2021-03-27: qty 3

## 2021-03-27 MED ORDER — FENTANYL CITRATE (PF) 100 MCG/2ML IJ SOLN
INTRAMUSCULAR | Status: AC
  Administered 2021-03-27: 50 ug via INTRAVENOUS
  Filled 2021-03-27: qty 2

## 2021-03-27 MED ORDER — ACETAMINOPHEN 325 MG PO TABS
650.0000 mg | ORAL_TABLET | ORAL | Status: DC | PRN
  Administered 2021-03-27: 650 mg via ORAL
  Filled 2021-03-27: qty 2

## 2021-03-27 MED ORDER — DEXAMETHASONE SODIUM PHOSPHATE 10 MG/ML IJ SOLN
INTRAMUSCULAR | Status: DC | PRN
  Administered 2021-03-27: 10 mg via INTRAVENOUS

## 2021-03-27 MED ORDER — MIDAZOLAM HCL 2 MG/2ML IJ SOLN
INTRAMUSCULAR | Status: AC
  Filled 2021-03-27: qty 2

## 2021-03-27 SURGICAL SUPPLY — 70 items
ADH SKN CLS APL DERMABOND .7 (GAUZE/BANDAGES/DRESSINGS) ×1
APL SRG 60D 8 XTD TIP BNDBL (TIP)
BASKET BONE COLLECTION (BASKET) ×2 IMPLANT
BLADE BN FN 3.2XSTRL LF (MISCELLANEOUS) IMPLANT
BLADE BONE MILL FINE (MISCELLANEOUS) ×2
BLADE CLIPPER SURG (BLADE) IMPLANT
BUR MATCHSTICK NEURO 3.0 LAGG (BURR) ×2 IMPLANT
CAGE COROENT LG 10X9X23-12 (Cage) ×2 IMPLANT
CANISTER SUCT 3000ML PPV (MISCELLANEOUS) ×2 IMPLANT
CNTNR URN SCR LID CUP LEK RST (MISCELLANEOUS) ×1 IMPLANT
CONNECTOR RELINE 30MM OPEN OFF (Connector) ×2 IMPLANT
CONT SPEC 4OZ STRL OR WHT (MISCELLANEOUS) ×2
COVER BACK TABLE 60X90IN (DRAPES) ×2 IMPLANT
COVER WAND RF STERILE (DRAPES) ×2 IMPLANT
DECANTER SPIKE VIAL GLASS SM (MISCELLANEOUS) ×2 IMPLANT
DERMABOND ADVANCED (GAUZE/BANDAGES/DRESSINGS) ×1
DERMABOND ADVANCED .7 DNX12 (GAUZE/BANDAGES/DRESSINGS) ×1 IMPLANT
DEVICE DISSECT PLASMABLAD 3.0S (MISCELLANEOUS) ×1 IMPLANT
DRAPE C-ARM 42X72 X-RAY (DRAPES) ×4 IMPLANT
DRAPE C-ARMOR (DRAPES) ×1 IMPLANT
DRAPE HALF SHEET 40X57 (DRAPES) IMPLANT
DRAPE LAPAROTOMY 100X72X124 (DRAPES) ×2 IMPLANT
DRSG OPSITE POSTOP 4X8 (GAUZE/BANDAGES/DRESSINGS) ×1 IMPLANT
DURAPREP 26ML APPLICATOR (WOUND CARE) ×2 IMPLANT
DURASEAL APPLICATOR TIP (TIP) IMPLANT
DURASEAL SPINE SEALANT 3ML (MISCELLANEOUS) IMPLANT
ELECT REM PT RETURN 9FT ADLT (ELECTROSURGICAL) ×2
ELECTRODE REM PT RTRN 9FT ADLT (ELECTROSURGICAL) ×1 IMPLANT
GAUZE 4X4 16PLY RFD (DISPOSABLE) IMPLANT
GAUZE SPONGE 4X4 12PLY STRL (GAUZE/BANDAGES/DRESSINGS) ×2 IMPLANT
GLOVE BIOGEL PI IND STRL 8.5 (GLOVE) ×2 IMPLANT
GLOVE BIOGEL PI INDICATOR 8.5 (GLOVE) ×2
GLOVE ECLIPSE 8.5 STRL (GLOVE) ×4 IMPLANT
GLOVE SRG 8 PF TXTR STRL LF DI (GLOVE) IMPLANT
GLOVE SURG UNDER POLY LF SZ7.5 (GLOVE) ×3 IMPLANT
GLOVE SURG UNDER POLY LF SZ8 (GLOVE) ×2
GOWN STRL REUS W/ TWL LRG LVL3 (GOWN DISPOSABLE) IMPLANT
GOWN STRL REUS W/ TWL XL LVL3 (GOWN DISPOSABLE) IMPLANT
GOWN STRL REUS W/TWL 2XL LVL3 (GOWN DISPOSABLE) ×4 IMPLANT
GOWN STRL REUS W/TWL LRG LVL3 (GOWN DISPOSABLE) ×2
GOWN STRL REUS W/TWL XL LVL3 (GOWN DISPOSABLE) ×2
GRAFT BONE PROTEIOS MED 2.5CC (Orthopedic Implant) ×1 IMPLANT
HEMOSTAT POWDER KIT SURGIFOAM (HEMOSTASIS) ×3 IMPLANT
KIT BASIN OR (CUSTOM PROCEDURE TRAY) ×2 IMPLANT
KIT TURNOVER KIT B (KITS) ×2 IMPLANT
MILL MEDIUM DISP (BLADE) ×2 IMPLANT
NEEDLE HYPO 22GX1.5 SAFETY (NEEDLE) ×2 IMPLANT
NS IRRIG 1000ML POUR BTL (IV SOLUTION) ×2 IMPLANT
PACK LAMINECTOMY NEURO (CUSTOM PROCEDURE TRAY) ×2 IMPLANT
PAD ARMBOARD 7.5X6 YLW CONV (MISCELLANEOUS) ×6 IMPLANT
PATTIES SURGICAL .5 X1 (DISPOSABLE) ×2 IMPLANT
PATTIES SURGICAL 1X1 (DISPOSABLE) ×1 IMPLANT
PLASMABLADE 3.0S (MISCELLANEOUS) ×2
ROD RELINE-O 5.5X100 LORD (Rod) ×2 IMPLANT
SCREW LOCK RELINE 5.5 TULIP (Screw) ×12 IMPLANT
SCREW RELINE 7.5X 80MM 2S POLY (Screw) ×2 IMPLANT
SCREW RELINE-O POLY 6.5X40 (Screw) ×2 IMPLANT
SPONGE LAP 4X18 RFD (DISPOSABLE) IMPLANT
SPONGE SURGIFOAM ABS GEL 100 (HEMOSTASIS) ×2 IMPLANT
SUT PROLENE 6 0 BV (SUTURE) ×1 IMPLANT
SUT VIC AB 1 CT1 18XBRD ANBCTR (SUTURE) ×1 IMPLANT
SUT VIC AB 1 CT1 8-18 (SUTURE) ×2
SUT VIC AB 2-0 CP2 18 (SUTURE) ×2 IMPLANT
SUT VIC AB 3-0 SH 8-18 (SUTURE) ×2 IMPLANT
SUT VIC AB 4-0 RB1 18 (SUTURE) ×2 IMPLANT
SYR 3ML LL SCALE MARK (SYRINGE) ×8 IMPLANT
TOWEL GREEN STERILE (TOWEL DISPOSABLE) ×2 IMPLANT
TOWEL GREEN STERILE FF (TOWEL DISPOSABLE) ×2 IMPLANT
TRAY FOLEY MTR SLVR 16FR STAT (SET/KITS/TRAYS/PACK) ×2 IMPLANT
WATER STERILE IRR 1000ML POUR (IV SOLUTION) ×2 IMPLANT

## 2021-03-27 NOTE — H&P (Signed)
Kathryn [name] is an 65 y.o. female.   Chief Complaint: Bilateral lower extremity pain, back pain.  History of fusion L2-L5 HPI: Kathryn [name] is a 65 year old individual whose had significant spondylitic disease and a spondylolisthesis at L4-L5 that was initially fused a number of years ago.  She developed adjacent level disease and an osteoporotic compression fracture at the L2 vertebrae.  She then underwent anterolateral decompression and fusion L2-3 and L3-4 which resulted in some subsidence of L3.  She has had progressive disease now at L5-S1 with an anterolisthesis that is evolved over the last couple of years.  She has biforaminal stenosis for the L5 nerve root and complained significantly of bilateral L5 radicular pain that is very positional.  She is now admitted to undergo surgical decompression at the L5-S1 level with fixation to the pelvis.  Past Medical History:  Diagnosis Date  . Anxiety   . Cancer ([place]) 10/2016   Breast right,  lumpectomy,  did not require chemo/xrt  . DDD (degenerative disc disease), cervical   . Depression   . Dyspnea   . Elevated cholesterol   . GERD (gastroesophageal reflux disease)   . H/O hiatal hernia   . Hypertension   . Hypothyroidism   . Paroxysmal atrial fibrillation ([place]) 11/20/2020  . PONV (postoperative nausea and vomiting)    felt cut with one of past knee surgeries  . Tremor    patient states "tremor has gotten better" on 06/30/19  . Wears partial dentures    upper    Past Surgical History:  Procedure Laterality Date  . ABDOMINAL HYSTERECTOMY  yrs ago  . ANTERIOR LAT LUMBAR FUSION N/A 11/21/2019   Procedure: Lumbar two-three Lumbar three-four Anterolateral lumbar interbody fusion with lateral plate;  Surgeon: Kristeen Miss, MD;  Location: [facility];  Service: Neurosurgery;  Laterality: N/A;  . BREAST EXCISIONAL BIOPSY Right 11/2016   ALH and papillomas  . BREAST LUMPECTOMY WITH RADIOACTIVE SEED LOCALIZATION Right 12/15/2016   Procedure:  RADIOACTIVE SEED X 2 GUIDED EXCISIONAL BREAST BIOPSY;  Surgeon: Rolm Bookbinder, MD;  Location: [place];  Service: General;  Laterality: Right;  . CARPAL TUNNEL RELEASE Bilateral yrs ago   x 2  . cervical neck fusion  yrs ago   C 3 and C 4   . COLONOSCOPY    . left knee arthroscopy   last done 2008   x 3  . left shoulder arthroscopy  yrs ago  . lower back surgery  2005  . right total knee replacement   2008  . TOTAL KNEE ARTHROPLASTY  01/14/2012   Procedure: TOTAL KNEE ARTHROPLASTY;  Surgeon: Johnn Hai, MD;  [facility];  Service: Orthopedics;  Laterality: Left;  preop femoral nerve block  . UPPER GI ENDOSCOPY    . WISDOM TOOTH EXTRACTION      History reviewed. No pertinent family history. Social History:  reports that she has never smoked. She has never used smokeless tobacco. She reports current alcohol use. She reports that she does not use drugs.  Allergies: No Known Allergies  Medications Prior to Admission  Medication Sig Dispense Refill  . acetaminophen (TYLENOL) 500 MG tablet Take 500-1,000 mg by mouth every 6 (six) hours as needed for moderate pain.    Marland Kitchen ALPRAZolam (XANAX) 0.25 MG tablet Take 0.25 mg by mouth at bedtime.    . Calcium Carb-Cholecalciferol (CALCIUM 600 + D PO) Take 600 mg by mouth daily.    . Cholecalciferol (VITAMIN D3) 25 MCG (1000  UT) CAPS Take 1,000 Units by mouth daily.    Marland Kitchen diltiazem (CARDIZEM) 30 MG tablet Take 1 tablet every 4 hours AS NEEDED for heart rate >100 as long as blood pressure >100. (Patient taking differently: Take 30 mg by mouth See admin instructions. Take 30 mg tablet every 4 hours AS NEEDED for heart rate >100 as long as blood pressure >100.) 30 tablet 1  . DULoxetine (CYMBALTA) 30 MG capsule Take 30 mg by mouth See admin instructions. Take with 60 mg to equal 90 mg at night    . DULoxetine (CYMBALTA) 60 MG capsule Take 60 mg by mouth See admin instructions. Take with 30 mg to equal 90 mg at night    .  levothyroxine (SYNTHROID) 200 MCG tablet Take 200 mcg by mouth daily before breakfast.    . metoprolol succinate (TOPROL-XL) 100 MG 24 hr tablet Take 1 tablet (100 mg total) by mouth daily. Take with or immediately following a meal. 90 tablet 3  . omeprazole (PRILOSEC OTC) 20 MG tablet Take 20 mg by mouth daily.    Marland Kitchen oxyCODONE-acetaminophen (PERCOCET/ROXICET) 5-325 MG tablet Take 1-2 tablets by mouth every 4 (four) hours as needed for moderate pain or severe pain. 60 tablet 0  . rivaroxaban (XARELTO) 20 MG TABS tablet Take 1 tablet (20 mg total) by mouth daily with supper. 90 tablet 3  . tamoxifen (NOLVADEX) 20 MG tablet TAKE 1 TABLET BY MOUTH DAILY (Patient taking differently: Take 20 mg by mouth daily.) 90 tablet 12    No results found for this or any previous visit (from the past 48 hour(s)). No results found.  Review of Systems  Constitutional: Positive for activity change.  Eyes: Negative.   Respiratory: Negative.   Cardiovascular: Negative.   Gastrointestinal: Negative.   Endocrine: Negative.   Musculoskeletal: Positive for back pain, gait problem and myalgias.  Allergic/Immunologic: Negative.   Neurological: Positive for weakness and numbness.  Hematological: Negative.   Psychiatric/Behavioral: Negative.     Blood pressure (!) 179/90, pulse 73, temperature 98.4 F (36.9 C), temperature source Oral, resp. rate 20, height 5\' 6"  (1.676 m), weight 100.2 kg, SpO2 100 %. Physical Exam Constitutional:      Appearance: She is obese.  HENT:     Head: Normocephalic and atraumatic.     Nose: Nose normal.     Mouth/Throat:     Mouth: Mucous membranes are moist.  Eyes:     Extraocular Movements: Extraocular movements intact.     Pupils: Pupils are equal, round, and reactive to light.  Cardiovascular:     Rate and Rhythm: Normal rate and regular rhythm.     Pulses: Normal pulses.     Heart sounds: Normal heart sounds.  Pulmonary:     Effort: Pulmonary effort is normal.     Breath  sounds: Normal breath sounds.  Abdominal:     General: Bowel sounds are normal.     Palpations: Abdomen is soft.  Musculoskeletal:     Cervical back: Normal range of motion and neck supple.     Comments: Positive straight leg raising at 30 degrees Patrick's maneuver is negative bilaterally.  Palpation and percussion of the lumbar spine reproduces modest tenderness at the lumbosacral junction.  Skin:    General: Skin is warm and dry.     Capillary Refill: Capillary refill takes less than 2 seconds.  Neurological:     Mental Status: She is alert.     Comments: Generalized weakness in the iliopsoas quadriceps tibialis  anterior and gastrocs all rated at 4 out of 5 absent reflexes in the patella and the Achilles both.  Sensation is diminished below the level of the knees to pin and vibration.  Upper extremity strength is intact reflexes are absent in the biceps triceps and brachioradialis.  Sensation is intact in the upper extremities.  Cranial nerve examination is normal.  Psychiatric:        Mood and Affect: Mood normal.        Behavior: Behavior normal.        Thought Content: Thought content normal.        Judgment: Judgment normal.      Assessment/Plan Spondylolisthesis L5-S1 with by foraminal stenosis L5.  Plan: Decompression and fusion L5-S1 with fixation to the pelvis  Earleen Newport, MD 03/27/2021, 7:49 AM

## 2021-03-27 NOTE — Transfer of Care (Signed)
Immediate Anesthesia Transfer of Care Note  Patient: Kathryn [name]  Procedure(s) Performed: Lumbar five Sacral one  Posterior lumbar interbody fusion with pedicle fixation to pelvis (N/A Back)  Patient Location: PACU  Anesthesia Type:General  Level of Consciousness: awake, alert  and oriented  Airway & Oxygen Therapy: Patient Spontanous Breathing and Patient connected to face mask oxygen  Post-op Assessment: Report given to RN and Post -op Vital signs reviewed and stable  Post vital signs: Reviewed and stable  Last Vitals:  Vitals Value Taken Time  BP 119/63 03/27/21 1337  Temp    Pulse 81 03/27/21 1342  Resp 18 03/27/21 1342  SpO2 96 % 03/27/21 1342  Vitals shown include unvalidated device data.  Last Pain:  Vitals:   03/27/21 0631  TempSrc:   PainSc: 8          Complications: No complications documented.

## 2021-03-27 NOTE — Progress Notes (Signed)
Orthopedic Tech Progress Note Patient Details:  Kathryn [name] 1956/05/13 [id number]  Ortho Devices Type of Ortho Device: Lumbar corsett Ortho Device/Splint Location: Back Ortho Device/Splint Interventions: Ordered   Post Interventions Patient Tolerated: Other (comment) Instructions Provided: Other (comment)   [name] 03/27/2021, 6:44 PM

## 2021-03-27 NOTE — Anesthesia Procedure Notes (Signed)
Procedure Name: Intubation Date/Time: 03/27/2021 8:15 AM Performed by: Trinna Post., CRNA Pre-anesthesia Checklist: Patient identified, Emergency Drugs available, Suction available, Patient being monitored and Timeout performed Patient Re-evaluated:Patient Re-evaluated prior to induction Oxygen Delivery Method: Circle system utilized Preoxygenation: Pre-oxygenation with 100% oxygen Induction Type: IV induction Ventilation: Mask ventilation without difficulty Laryngoscope Size: Mac and 3 Grade View: Grade I Tube type: Oral Tube size: 7.0 mm Number of attempts: 1 Airway Equipment and Method: Stylet Placement Confirmation: ETT inserted through vocal cords under direct vision,  positive ETCO2 and breath sounds checked- equal and bilateral Secured at: 22 cm Tube secured with: Tape Dental Injury: Teeth and Oropharynx as per pre-operative assessment

## 2021-03-27 NOTE — Progress Notes (Signed)
Elevated BP 189/104. Patient took this AM Metoprolol 100 mg at home. Dr. Fransisco Beau made aware. When rechecked, BP increased to 192/126. Per MD verbal order patient received Hydralazine 10 mg IV. Will continue to monitor.

## 2021-03-27 NOTE — Op Note (Signed)
Date of surgery: 03/27/2021 Preoperative diagnosis: Spondylosis and stenosis in the lateral recesses with L5 radiculopathy L5-S1 history of fusion L2-L5. Postoperative diagnosis: Same Procedure: Bilateral laminectomy L5 decompression of the L5 and S1 nerve roots with more work than required for simple interbody technique.  Posterior lumbar interbody arthrodesis using peek spacers local autograft and allograft pedicle screw fixation to the ileum and the sacrum from previous construct from  L4-L5 Surgeon: Kristeen Miss First Assistant: Earle Gell, MD Anesthesia: General endotracheal Indications: Kathryn [name] is a 65 year old individual whose had a previous spondylolisthesis at L4-L5 which underwent decompression and fusion a several years ago.  She has had an anterolateral decompression at L2-3 and L3-4 also.  She has developed significant spondylitic disease at L5-S1 and has L5 radicular symptoms particularly on the left.  Is been advised regarding the need for surgical decompression.  There is also present a large right-sided disc herniation above the disc space at L5-S1.  Procedure: Patient was brought to the operating room supine on the stretcher.  After the smooth induction of general endotracheal anesthesia, she was carefully turned prone.  The back was prepped with alcohol DuraPrep and draped in a sterile fashion.  Previously made lumbar incision was reopened and dissected down to the lumbodorsal fascia.  Fascia was opened on either side of midline to expose L4-L5 and the sacrum.  Dissection was carried out in a subperiosteal fashion to expose the previous hardware that had been placed.  Once this was uncovered the hardware was loosened and the rods were removed.  The L5 screws were removed.  Dissection then proceeded along the L5 laminar arch and a hemilaminectomy was performed on the right side removing all the way out to the facet joint at L5-S1.  Once this area was decompressed the common dural  tube was carefully decompressed and the path the L5 nerve root was traced out.  On the left side there was noted to be a fracture through the laminar arch and the pedicle complex.  This provided some movement in this also had some scar tissue noted to be built up around it.  This was carefully released and the path the L5 nerve root was carefully decompressed.  Once an adequate decompression was obtained for the L5 nerve root the S1 nerve root was exposed and the disc space was isolated.  Once the disc was isolated at L5-S1 on both sides a discectomy was performed using a #15 blade to enter the disc space with a series of curettes and rongeurs with disc space spreaders and disc shavers to disarticulate any endplate cartilage.  Endplates were cleared thoroughly to remove any endplate material and then once the disc space was opened completely the spacer was placed into it this measured 10 mm x 23 mm with 12 degrees of lordosis.  To the spacers were filled with autograft mixed with 2-1/2 cc of Proteus.  A total of 15 cc of bone graft was packed into the interspace bone graft coming from the laminar arch and facet joints of the L5 vertebrae and any bone that was harvested in the decompression.  No allograft was required.  Once the interspace was packed and the spacers were placed attention was turned to placing S1 pedicle screws these were 6.5 x 40 mm screws.  Then the iliac screws were placed by direct probing into the ilium and 7.5 x 80 mm screws were placed there a 30 mm SideArm connector was used on each side and then a 100 mm  rod was contoured to fit between the saddles from L4 to the iliac screws.  These were then tightened in a neutral construct.  Final tightening was obtained and care was taken to make sure that all the saddles were fully filled with screws in her connectors.  Once this was verified final radiographs were obtained in AP and lateral projection which showed good reduction of the L5-S1 space  opening of the foramen and good overall alignment within normal lordosis.  With this being the case care was taken to make sure that the L5 and S1 nerve roots remained well decompressed hemostasis was achieved in the soft tissues and 25 cc of half percent Marcaine was injected into the paraspinous fascia.  The fascia was then closed with #1 Vicryl in interrupted fashion 2-0 Vicryl was used in subcutaneous tissues 3-0 Vicryl and 4-0 Vicryl subcuticularly.  Dermabond was placed in the skin.  Blood loss for the procedure was estimated 500 cc, 200 cc of Cell Saver blood was returned to the patient.

## 2021-03-27 NOTE — Anesthesia Postprocedure Evaluation (Signed)
Anesthesia Post Note  Patient: Kathryn [name]  Procedure(s) Performed: Lumbar five Sacral one  Posterior lumbar interbody fusion with pedicle fixation to pelvis (N/A Back)     Patient location during evaluation: PACU Anesthesia Type: General Level of consciousness: awake and alert Pain management: pain level controlled Vital Signs Assessment: post-procedure vital signs reviewed and stable Respiratory status: spontaneous breathing, nonlabored ventilation and respiratory function stable Cardiovascular status: blood pressure returned to baseline and stable Postop Assessment: no apparent nausea or vomiting Anesthetic complications: no   No complications documented.  Last Vitals:  Vitals:   03/27/21 1437 03/27/21 1504  BP: 107/69 132/83  Pulse: 64 69  Resp: 17 18  Temp: 36.7 C 36.5 C  SpO2: 97% 98%    Last Pain:  Vitals:   03/27/21 1504  TempSrc: Oral  PainSc:                  Audry Pili

## 2021-03-27 NOTE — Progress Notes (Signed)
Patient ID: Kathryn [name], female   DOB: January 06, 1956, 66 y.o.   MRN: [id number] Vital signs are stable patient is moving her lower extremities well stable postop

## 2021-03-28 LAB — BASIC METABOLIC PANEL
Anion gap: 8 (ref 5–15)
BUN: 13 mg/dL (ref 8–23)
CO2: 29 mmol/L (ref 22–32)
Calcium: 8.4 mg/dL — ABNORMAL LOW (ref 8.9–10.3)
Chloride: 101 mmol/L (ref 98–111)
Creatinine, Ser: 0.94 mg/dL (ref 0.44–1.00)
GFR, Estimated: >60 mL/min (ref >60)
Glucose, Bld: 125 mg/dL — ABNORMAL HIGH (ref 70–99)
Potassium: 4.5 mmol/L (ref 3.5–5.1)
Sodium: 138 mmol/L (ref 135–145)

## 2021-03-28 LAB — CBC
HCT: 25.8 % — ABNORMAL LOW (ref 36.0–46.0)
Hemoglobin: 7.8 g/dL — ABNORMAL LOW (ref 12.0–15.0)
MCH: 26.3 pg (ref 26.0–34.0)
MCHC: 30.2 g/dL (ref 30.0–36.0)
MCV: 86.9 fL (ref 80.0–100.0)
Platelets: 211 10*3/uL (ref 150–400)
RBC: 2.97 MIL/uL — ABNORMAL LOW (ref 3.87–5.11)
RDW: 16.3 % — ABNORMAL HIGH (ref 11.5–15.5)
WBC: 7.9 10*3/uL (ref 4.0–10.5)
nRBC: 0 % (ref 0.0–0.2)

## 2021-03-28 MED ORDER — DEXAMETHASONE 1 MG PO TABS: ORAL_TABLET | ORAL | 0 refills | Status: DC

## 2021-03-28 MED ORDER — METHOCARBAMOL 500 MG PO TABS: 500.0000 mg | ORAL_TABLET | Freq: Four times a day (QID) | ORAL | 3 refills | Status: DC | PRN

## 2021-03-28 MED ORDER — OXYCODONE-ACETAMINOPHEN 5-325 MG PO TABS: 1.0000 | ORAL_TABLET | ORAL | 0 refills | Status: DC | PRN

## 2021-03-28 MED FILL — Thrombin For Soln 5000 Unit: CUTANEOUS | Qty: 5000 | Status: AC

## 2021-03-28 NOTE — Progress Notes (Signed)
Patient is discharged from room 3C07 at this time. Alert and in stable condition. IV site d/c'd and instructions read to patient and family with understanding verbalized and all questions answered. Left unit via wheelchair with all belongings at side.

## 2021-03-28 NOTE — Progress Notes (Signed)
Patient ID: Kathryn [name], female   DOB: September 01, 1956, 65 y.o.   MRN: [id number] Vital signs are stable Patient has postop hemoglobin of 7.8 this is down 2 g from her preop of 10.1 She does not appear to be symptomatic and has walked several times She wishes to go home today Dressing is clean dry We will plan discharge today

## 2021-03-28 NOTE — Discharge Summary (Signed)
Physician Discharge Summary  Patient ID: Kathryn [name] MRN: [id number] DOB/AGE: 67-Dec-1957 67 y.o.  Admit date: 03/27/2021 Discharge date: 03/28/2021  Admission Diagnoses: L5-S1 spondylosis and stenosis with L5 radiculopathy history of fusion L4-L5 L2-L3 and L3-L4.  Lumbar radiculopathy  Discharge Diagnoses: Spondylosis and stenosis L5-S1 lumbar radiculopathy.  History of fusion L3-4 L2-3 and L4-5. Active Problems:   Chronic left-sided lumbar radiculopathy   Discharged Condition: good  Hospital Course: Patient was admitted to undergo surgical decompression of L5-S1 which he tolerated well.  She has stabilization to the pelvis.  She is ambulatory.  She does have some significant anemia which she had preoperatively with a hemoglobin of 10.1 postoperative hemoglobin is 7.8.  She will not  need transfusion.  Consults: None  Significant Diagnostic Studies: None  Treatments: surgery: See op note  Discharge Exam: Blood pressure (!) 101/57, pulse 77, temperature 97.8 F (36.6 C), temperature source Oral, resp. rate 16, height 5\' 6"  (1.676 m), weight 100.2 kg, SpO2 97 %. Incision is clean and dry Station and gait are intact  Disposition: Discharge disposition: 01-Home or Self Care       Discharge Instructions    Call MD for:  redness, tenderness, or signs of infection (pain, swelling, redness, odor or green/yellow discharge around incision site)   Complete by: As directed    Call MD for:  severe uncontrolled pain   Complete by: As directed    Call MD for:  temperature >100.4   Complete by: As directed    Diet - low sodium heart healthy   Complete by: As directed    Discharge wound care:   Complete by: As directed    Okay to shower. Do not apply salves or appointments to incision. No heavy lifting with the upper extremities greater than 10 pounds. May resume driving when not requiring pain medication and patient feels comfortable with doing so.   Incentive spirometry RT   Complete  by: As directed    Increase activity slowly   Complete by: As directed      Allergies as of 03/28/2021   No Known Allergies     Medication List    TAKE these medications   acetaminophen 500 MG tablet Commonly known as: TYLENOL Take 500-1,000 mg by mouth every 6 (six) hours as needed for moderate pain.   ALPRAZolam 0.25 MG tablet Commonly known as: XANAX Take 0.25 mg by mouth at bedtime.   CALCIUM 600 + D PO Take 600 mg by mouth daily.   dexamethasone 1 MG tablet Commonly known as: DECADRON 2 tablets twice daily for 2 days, one tablet twice daily for 2 days, one tablet daily for 2 days.   diltiazem 30 MG tablet Commonly known as: Cardizem Take 1 tablet every 4 hours AS NEEDED for heart rate >100 as long as blood pressure >100. What changed:   how much to take  how to take this  when to take this  additional instructions   DULoxetine 30 MG capsule Commonly known as: CYMBALTA Take 30 mg by mouth See admin instructions. Take with 60 mg to equal 90 mg at night   DULoxetine 60 MG capsule Commonly known as: CYMBALTA Take 60 mg by mouth See admin instructions. Take with 30 mg to equal 90 mg at night   levothyroxine 200 MCG tablet Commonly known as: SYNTHROID Take 200 mcg by mouth daily before breakfast.   methocarbamol 500 MG tablet Commonly known as: ROBAXIN Take 1 tablet (500 mg total) by mouth every  6 (six) hours as needed for muscle spasms.   metoprolol succinate 100 MG 24 hr tablet Commonly known as: TOPROL-XL Take 1 tablet (100 mg total) by mouth daily. Take with or immediately following a meal.   omeprazole 20 MG tablet Commonly known as: PRILOSEC OTC Take 20 mg by mouth daily.   oxyCODONE-acetaminophen 5-325 MG tablet Commonly known as: PERCOCET/ROXICET Take 1-2 tablets by mouth every 4 (four) hours as needed for moderate pain or severe pain.   rivaroxaban 20 MG Tabs tablet Commonly known as: XARELTO Take 1 tablet (20 mg total) by mouth daily with  supper.   tamoxifen 20 MG tablet Commonly known as: NOLVADEX TAKE 1 TABLET BY MOUTH DAILY   Vitamin D3 25 MCG (1000 UT) Caps Take 1,000 Units by mouth daily.            Discharge Care Instructions  (From admission, onward)         Start     Ordered   03/28/21 0000  Discharge wound care:       Comments: Okay to shower. Do not apply salves or appointments to incision. No heavy lifting with the upper extremities greater than 10 pounds. May resume driving when not requiring pain medication and patient feels comfortable with doing so.   03/28/21 0805           Signed: Blanchie Dessert Logen Heintzelman 03/28/2021, 8:06 AM

## 2021-03-28 NOTE — Evaluation (Signed)
Occupational Therapy Evaluation Patient Details Name: Kathryn [name] MRN: [id number] DOB: 08/26/1956 Today's Date: 03/28/2021    History of Present Illness 65 y.o. female with a history of bilateral lower extremity pain, back pain.  History of fusion L2-L5.  PMH includes:Anxiety, Cancer ([place]) 10/2016    Breast right, DDD (degenerative disc disease), cervical, Depression, Dyspnea, Elevated cholesterol, Hypertension.  Patient underwent Bilateral laminectomy L5 decompression.   Clinical Impression   Patient admitted for the above diagnosis and procedure.  PTA she was able to perform her own ADL, but needed assist for meal prep and home management.  She does continue to drive if needed, and her spouse and grandson will be able to provide assist as needed.  Barriers are listed above.  Currently she is pretty close to baseline.  She was able to use pieces of the hip kit to complete her ADL, and is moving with the RW well.  No further acute OT needs.      Follow Up Recommendations  No OT follow up    Equipment Recommendations  Other (comment) (LH Sponge)    Recommendations for Other Services       Precautions / Restrictions Precautions Precautions: Back Precaution Booklet Issued: Yes (comment) Required Braces or Orthoses: Spinal Brace Spinal Brace: Thoracolumbosacral orthotic;Applied in sitting position Restrictions Weight Bearing Restrictions: No      Mobility Bed Mobility Overal bed mobility: Needs Assistance Bed Mobility: Sidelying to Sit;Sit to Sidelying   Sidelying to sit: Modified independent (Device/Increase time)     Sit to sidelying: Min assist General bed mobility comments: close to baseline    Transfers Overall transfer level: Modified independent Equipment used: Rolling walker (2 wheeled)                  Balance Overall balance assessment: Needs assistance Sitting-balance support: Feet supported Sitting balance-Leahy Scale: Good     Standing balance  support: Bilateral upper extremity supported Standing balance-Leahy Scale: Fair Standing balance comment: able to static stand without use of RW                           ADL either performed or assessed with clinical judgement   ADL Overall ADL's : At baseline                                       General ADL Comments: Patient aboe to utilize reacher and dressing stick to dress herself from sit/stand level.  Using the RW for mobility.     Vision Patient Visual Report: No change from baseline       Perception     Praxis      Pertinent Vitals/Pain Pain Assessment: 0-10 Pain Score: 5  Pain Location: neck and traps Pain Descriptors / Indicators: Aching Pain Intervention(s): Monitored during session     Hand Dominance Right   Extremity/Trunk Assessment Upper Extremity Assessment Upper Extremity Assessment: Overall WFL for tasks assessed   Lower Extremity Assessment Lower Extremity Assessment: Defer to PT evaluation   Cervical / Trunk Assessment Cervical / Trunk Assessment: Other exceptions Cervical / Trunk Exceptions: spinal surgery   Communication Communication Communication: No difficulties   Cognition Arousal/Alertness: Awake/alert Behavior During Therapy: WFL for tasks assessed/performed Overall Cognitive Status: Within Functional Limits for tasks assessed  General Comments       Exercises     Shoulder Instructions      Home Living Family/patient expects to be discharged to:: Private residence Living Arrangements: Spouse/significant other Available Help at Discharge: Family;Available 24 hours/day Type of Home: House Home Access: Stairs to enter [employer] of Steps: 3 Entrance Stairs-Rails: Can reach both Home Layout: Two level;Able to live on main level with bedroom/bathroom     Bathroom Shower/Tub: Occupational psychologist: Standard     Home  Equipment: Environmental consultant - 2 wheels;Shower seat - built in;Grab bars - tub/shower;Hand held shower head;Adaptive equipment Adaptive Equipment: Reacher;Long-handled shoe horn        Prior Functioning/Environment Level of Independence: Independent        Comments: Patient able to bathe and dress herself.  Does drive.  Spouse able to assist with light home management and meals.  Generally walks reaching for objects in her environment.        OT Problem List: Impaired balance (sitting and/or standing);Pain      OT Treatment/Interventions:      OT Goals(Current goals can be found in the care plan section) Acute Rehab OT Goals Patient Stated Goal: Return home and recover OT Goal Formulation: With patient Time For Goal Achievement: 03/28/21  OT Frequency:     Barriers to D/C:            Co-evaluation              AM-PAC OT "6 Clicks" Daily Activity     Outcome Measure Help from another person eating meals?: None Help from another person taking care of personal grooming?: None Help from another person toileting, which includes using toliet, bedpan, or urinal?: None Help from another person bathing (including washing, rinsing, drying)?: None Help from another person to put on and taking off regular upper body clothing?: None Help from another person to put on and taking off regular lower body clothing?: None 6 Click Score: 24   End of Session Equipment Utilized During Treatment: Rolling walker;Back brace Nurse Communication: Mobility status  Activity Tolerance: Patient tolerated treatment well Patient left: in bed;with call bell/phone within reach  OT Visit Diagnosis: Pain                Time: 0821-0850 OT Time Calculation (min): 29 min Charges:  OT General Charges $OT Visit: 1 Visit OT Evaluation $OT Eval Moderate Complexity: 1 Mod OT Treatments $Self Care/Home Management : 8-22 mins  03/28/2021  Rich, OTR/L  Acute Rehabilitation Services  Office:   [phone]   Metta Clines 03/28/2021, 9:03 AM

## 2021-03-28 NOTE — Discharge Instructions (Signed)

## 2021-03-28 NOTE — Evaluation (Addendum)
Physical Therapy Evaluation Patient Details Name: Kathryn [name] MRN: [id number] DOB: 28-Dec-1955 Today's Date: 03/28/2021   History of Present Illness  Pt is a 65 y.o. female who presents s/p bilateral laminectomy L5 decompression on 03/27/2021. PMH significant for Cancer, HTN.  Clinical Impression  Patient evaluated by Physical Therapy with no further acute PT needs identified. All education has been completed and the patient has no further questions. Pt was able to demonstrate transfers and ambulation with gross modified independence to supervision for safety and RW for support. Pt was educated on precautions, brace application/wearing schedule, appropriate activity progression, and car transfer. See below for any follow-up Physical Therapy or equipment needs. PT is signing off. Thank you for this referral.     Follow Up Recommendations No PT follow up;Supervision for mobility/OOB    Equipment Recommendations  None recommended by PT    Recommendations for Other Services       Precautions / Restrictions Precautions Precautions: Back Precaution Booklet Issued: Yes (comment) Required Braces or Orthoses: Spinal Brace Spinal Brace: Lumbar Corset;Applied in sitting position Restrictions Weight Bearing Restrictions: No      Mobility  Bed Mobility Overal bed mobility: Needs Assistance Bed Mobility: Rolling;Sidelying to Sit   Sidelying to sit: Modified independent (Device/Increase time)       General bed mobility comments: No assist required to transition to EOB. HOB flat and rails lowered to simulate home environment.    Transfers Overall transfer level: Modified independent Equipment used: Rolling walker (2 wheeled)             General transfer comment: VC's for hand placement on seated surface for safety.  Ambulation/Gait Ambulation/Gait assistance: Supervision Gait Distance (Feet): 200 Feet Assistive device: Rolling walker (2 wheeled) Gait Pattern/deviations:  Step-through pattern;Decreased stride length;Trunk flexed Gait velocity: Decreased Gait velocity interpretation: <1.31 ft/sec, indicative of household ambulator General Gait Details: VC's for improved posture, closer walker proximity, and forward gaze. No unsteadiness or LOB noted.  Stairs Stairs: Yes Stairs assistance: Min guard Stair Management: Two rails;Step to pattern;Forwards Number of Stairs: 3 General stair comments: VC's for sequencing and general safety.  Wheelchair Mobility    Modified Rankin (Stroke Patients Only)       Balance Overall balance assessment: Needs assistance Sitting-balance support: Feet supported Sitting balance-Leahy Scale: Good     Standing balance support: Bilateral upper extremity supported Standing balance-Leahy Scale: Fair Standing balance comment: able to static stand without use of RW                             Pertinent Vitals/Pain Pain Assessment: Faces Faces Pain Scale: Hurts little more Pain Location: neck and traps Pain Descriptors / Indicators: Aching Pain Intervention(s): Limited activity within patient's tolerance;Monitored during session;Repositioned    Home Living Family/patient expects to be discharged to:: Private residence Living Arrangements: Spouse/significant other Available Help at Discharge: Family;Available 24 hours/day Type of Home: House Home Access: Stairs to enter Entrance Stairs-Rails: Can reach both Entrance Stairs-Number of Steps: 3 Home Layout: Two level;Able to live on main level with bedroom/bathroom Home Equipment: Gilford Rile - 2 wheels;Shower seat - built in;Grab bars - tub/shower;Hand held shower head;Adaptive equipment      Prior Function Level of Independence: Independent         Comments: Patient able to bathe and dress herself.  Does drive.  Spouse able to assist with light home management and meals.  Generally walks reaching for objects in her environment.  Hand Dominance    Dominant Hand: Right    Extremity/Trunk Assessment   Upper Extremity Assessment Upper Extremity Assessment: Defer to OT evaluation    Lower Extremity Assessment Lower Extremity Assessment: Generalized weakness    Cervical / Trunk Assessment Cervical / Trunk Assessment: Other exceptions Cervical / Trunk Exceptions: s/p spinal surgery  Communication   Communication: No difficulties  Cognition Arousal/Alertness: Awake/alert Behavior During Therapy: WFL for tasks assessed/performed Overall Cognitive Status: Within Functional Limits for tasks assessed                                        General Comments      Exercises     Assessment/Plan    PT Assessment Patent does not need any further PT services  PT Problem List         PT Treatment Interventions      PT Goals (Current goals can be found in the Care Plan section)  Acute Rehab PT Goals Patient Stated Goal: Return home and recover PT Goal Formulation: All assessment and education complete, DC therapy    Frequency     Barriers to discharge        Co-evaluation               AM-PAC PT "6 Clicks" Mobility  Outcome Measure Help needed turning from your back to your side while in a flat bed without using bedrails?: None Help needed moving from lying on your back to sitting on the side of a flat bed without using bedrails?: None Help needed moving to and from a bed to a chair (including a wheelchair)?: None Help needed standing up from a chair using your arms (e.g., wheelchair or bedside chair)?: None Help needed to walk in hospital room?: A Little Help needed climbing 3-5 steps with a railing? : A Little 6 Click Score: 22    End of Session Equipment Utilized During Treatment: Gait belt Activity Tolerance: Patient tolerated treatment well Patient left: Other (comment) (Sitting EOB with family present awaiting d/c instructions from RN) Nurse Communication: Mobility status PT Visit  Diagnosis: Unsteadiness on feet (R26.81);Pain Pain - part of body:  (shoulder/neck and incision site)    Time: 0932-3557 PT Time Calculation (min) (ACUTE ONLY): 19 min   Charges:   PT Evaluation $PT Eval Low Complexity: 1 Low          Rolinda Roan, PT, DPT Acute Rehabilitation Services Pager: [phone] Office: [place] 03/28/2021, 1:53 PM

## 2021-03-31 MED FILL — Heparin Sodium (Porcine) Inj 1000 Unit/ML: INTRAMUSCULAR | Qty: 30 | Status: AC

## 2021-03-31 MED FILL — Sodium Chloride IV Soln 0.9%: INTRAVENOUS | Qty: 2000 | Status: AC

## 2021-04-11 DIAGNOSIS — M4316 Spondylolisthesis, lumbar region: Secondary | ICD-10-CM | POA: Diagnosis not present

## 2021-04-15 ENCOUNTER — Ambulatory Visit (HOSPITAL_COMMUNITY): Payer: Medicare Other | Admitting: Nurse Practitioner

## 2021-04-17 DIAGNOSIS — E785 Hyperlipidemia, unspecified: Secondary | ICD-10-CM | POA: Diagnosis not present

## 2021-04-17 DIAGNOSIS — E039 Hypothyroidism, unspecified: Secondary | ICD-10-CM | POA: Diagnosis not present

## 2021-04-17 DIAGNOSIS — I700 Atherosclerosis of aorta: Secondary | ICD-10-CM | POA: Diagnosis not present

## 2021-04-17 DIAGNOSIS — I10 Essential (primary) hypertension: Secondary | ICD-10-CM | POA: Diagnosis not present

## 2021-04-17 DIAGNOSIS — M810 Age-related osteoporosis without current pathological fracture: Secondary | ICD-10-CM | POA: Diagnosis not present

## 2021-04-17 DIAGNOSIS — H539 Unspecified visual disturbance: Secondary | ICD-10-CM | POA: Diagnosis not present

## 2021-04-30 ENCOUNTER — Ambulatory Visit (HOSPITAL_COMMUNITY): Payer: Medicare Other | Admitting: Nurse Practitioner

## 2021-05-13 ENCOUNTER — Ambulatory Visit (HOSPITAL_COMMUNITY)
Admission: RE | Admit: 2021-05-13 | Discharge: 2021-05-13 | Disposition: A | Discharge status: Home / Self Care | Payer: Medicare Other | Source: Ambulatory Visit | Attending: Nurse Practitioner | Admitting: Nurse Practitioner

## 2021-05-13 ENCOUNTER — Other Ambulatory Visit: Payer: Self-pay

## 2021-05-13 ENCOUNTER — Encounter (HOSPITAL_COMMUNITY): Payer: Self-pay | Admitting: Nurse Practitioner

## 2021-05-13 VITALS — BP 144/84 | HR 68 | Ht 66.0 in | Wt 221.6 lb

## 2021-05-13 DIAGNOSIS — R0600 Dyspnea, unspecified: Secondary | ICD-10-CM | POA: Diagnosis not present

## 2021-05-13 DIAGNOSIS — D6869 Other thrombophilia: Secondary | ICD-10-CM | POA: Diagnosis not present

## 2021-05-13 DIAGNOSIS — Z79899 Other long term (current) drug therapy: Secondary | ICD-10-CM | POA: Insufficient documentation

## 2021-05-13 DIAGNOSIS — I480 Paroxysmal atrial fibrillation: Secondary | ICD-10-CM | POA: Diagnosis not present

## 2021-05-13 DIAGNOSIS — I4891 Unspecified atrial fibrillation: Secondary | ICD-10-CM

## 2021-05-13 DIAGNOSIS — I10 Essential (primary) hypertension: Secondary | ICD-10-CM | POA: Diagnosis not present

## 2021-05-13 DIAGNOSIS — Z7901 Long term (current) use of anticoagulants: Secondary | ICD-10-CM | POA: Insufficient documentation

## 2021-05-13 NOTE — Progress Notes (Signed)
Primary Care Physician: [name], MD Referring Physician: Dr. Leane Platt is a 65 y.o. female with a h/o palpitations that recently saw [name] for afib/HTN that was noted at time of scheduled endoscopy/colonoscopy. Of note, she did not take her BB dose prior to procedure that day. The procedure was cancelled. She was sent to the ER and she  left 2/2 long wait in the lobby. She was feeling improved. She saw Dr. Rayann Heman in f/u and he increased her BB and scheduled an echo which is pending tomorrow. He also started her on xarelto 20 mg daily for a CHA2DS2VASc score of 3. She has not had any issues with the change in  her meds. Colonoscopy/endoscopy was scheduled as she saw blood in her stool in the past but has not seen any with start of DOAC. She also has issues with food getting hung from  time to time.  She still feels minutes of palpitations but no extended periods of afib. She limits caffeine, no alcohol or tobacco and husband reports some snoring but no apnea. She is in rhythm today.   F/u in afib clinic, 05/13/21. She has only had one episode of afib last week, she took a Cardizem right away and it was over in 15 mins. She has not had her endo/colonoscopy rescheduled yet. She went on to have repeat back surgery in May.  She states that ever since she started having back surgery 3 years ago, she has not been able to walk very long distances and has noted exertional dyspnea with this. No chest discomfort. Unchanged over the last 3 years. Very sedentary. No issues with blood thinner. Still having issues with allowing.   Today, she denies symptoms of palpitations, chest pain, shortness of breath, orthopnea, PND, lower extremity edema, dizziness, presyncope, syncope, or neurologic sequela. The patient is tolerating medications without difficulties and is otherwise without complaint today.   Past Medical History:  Diagnosis Date   Anxiety    Cancer ([place]) 10/2016   Breast right,   lumpectomy,  did not require chemo/xrt   DDD (degenerative disc disease), cervical    Depression    Dyspnea    Elevated cholesterol    GERD (gastroesophageal reflux disease)    H/O hiatal hernia    Hypertension    Hypothyroidism    Paroxysmal atrial fibrillation ([facility]) 11/20/2020   PONV (postoperative nausea and vomiting)    felt cut with one of past knee surgeries   Tremor    patient states "tremor has gotten better" on 06/30/19   Wears partial dentures    upper   Past Surgical History:  Procedure Laterality Date   ABDOMINAL HYSTERECTOMY  yrs ago   ANTERIOR LAT LUMBAR FUSION N/A 11/21/2019   Procedure: Lumbar two-three Lumbar three-four Anterolateral lumbar interbody fusion with lateral plate;  Surgeon: Kristeen Miss, MD;  Location: [facility];  Service: Neurosurgery;  Laterality: N/A;   BREAST EXCISIONAL BIOPSY Right 11/2016   ALH and papillomas   BREAST LUMPECTOMY WITH RADIOACTIVE SEED LOCALIZATION Right 12/15/2016   Procedure: RADIOACTIVE SEED X 2 GUIDED EXCISIONAL BREAST BIOPSY;  Surgeon: Rolm Bookbinder, MD;  Location: [facility];  Service: General;  Laterality: Right;   CARPAL TUNNEL RELEASE Bilateral yrs ago   x 2   cervical neck fusion  yrs ago   C 3 and C 4    COLONOSCOPY     left knee arthroscopy   last done 2008   x 3  left shoulder arthroscopy  yrs ago   lower back surgery  2005   right total knee replacement   2008   TOTAL KNEE ARTHROPLASTY  01/14/2012   Procedure: TOTAL KNEE ARTHROPLASTY;  Surgeon: Johnn Hai, MD;  Location: [facility];  Service: Orthopedics;  Laterality: Left;  preop femoral nerve block   UPPER GI ENDOSCOPY     WISDOM TOOTH EXTRACTION      Current Outpatient Medications  Medication Sig Dispense Refill   acetaminophen (TYLENOL) 500 MG tablet Take 500-1,000 mg by mouth every 6 (six) hours as needed for moderate pain.     ALPRAZolam (XANAX) 0.25 MG tablet Take 0.25 mg by mouth at bedtime.     Calcium Carb-Cholecalciferol (CALCIUM  600 + D PO) Take 600 mg by mouth daily.     Cholecalciferol (VITAMIN D3) 25 MCG (1000 UT) CAPS Take 1,000 Units by mouth daily.     diltiazem (CARDIZEM) 30 MG tablet Take 1 tablet every 4 hours AS NEEDED for heart rate >100 as long as blood pressure >100. (Patient taking differently: Take 30 mg by mouth See admin instructions. Take 30 mg tablet every 4 hours AS NEEDED for heart rate >100 as long as blood pressure >100.) 30 tablet 1   DULoxetine (CYMBALTA) 30 MG capsule Take 30 mg by mouth See admin instructions. Take with 60 mg to equal 90 mg at night     DULoxetine (CYMBALTA) 60 MG capsule Take 60 mg by mouth See admin instructions. Take with 30 mg to equal 90 mg at night     gabapentin (NEURONTIN) 300 MG capsule Take 1 capsule by mouth 3 (three) times daily.     levothyroxine (SYNTHROID) 200 MCG tablet Take 200 mcg by mouth daily before breakfast.     methocarbamol (ROBAXIN) 500 MG tablet Take 1 tablet (500 mg total) by mouth every 6 (six) hours as needed for muscle spasms. 40 tablet 3   metoprolol succinate (TOPROL-XL) 100 MG 24 hr tablet Take 1 tablet (100 mg total) by mouth daily. Take with or immediately following a meal. 90 tablet 3   omeprazole (PRILOSEC OTC) 20 MG tablet Take 20 mg by mouth daily.     oxyCODONE-acetaminophen (PERCOCET/ROXICET) 5-325 MG tablet Take 1-2 tablets by mouth every 4 (four) hours as needed for moderate pain or severe pain. 60 tablet 0   rivaroxaban (XARELTO) 20 MG TABS tablet Take 1 tablet (20 mg total) by mouth daily with supper. 90 tablet 3   tamoxifen (NOLVADEX) 20 MG tablet TAKE 1 TABLET BY MOUTH DAILY (Patient taking differently: Take 20 mg by mouth daily.) 90 tablet 12   No current facility-administered medications for this encounter.    No Known Allergies  Social History   Socioeconomic History   Marital status: Married    Spouse name: Not on file   Number of children: Not on file   Years of education: Not on file   Highest education level: Not on  file  Occupational History   Not on file  Tobacco Use   Smoking status: Never   Smokeless tobacco: Never  Vaping Use   Vaping Use: Never used  Substance and Sexual Activity   Alcohol use: Yes    Comment: occasional wine   Drug use: No   Sexual activity: Not on file    Comment: Hysterectormy  Other Topics Concern   Not on file  Social History Narrative   Lives in [place] with spouse.   Retired Nurse, learning disability  Social Determinants of Health   Financial Resource Strain: Not on file  Food Insecurity: Not on file  Transportation Needs: Not on file  Physical Activity: Not on file  Stress: Not on file  Social Connections: Not on file  Intimate Partner Violence: Not on file    No family history on file.  ROS- All systems are reviewed and negative except as per the HPI above  Physical Exam: Vitals:   05/13/21 0944  BP: (!) 144/84  Pulse: 68  Weight: 100.5 kg  Height: 5\' 6"  (1.676 m)   Wt Readings from Last 3 Encounters:  05/13/21 100.5 kg  03/27/21 100.2 kg  03/24/21 100.3 kg    Labs: Lab Results  Component Value Date   NA 138 03/28/2021   K 4.5 03/28/2021   CL 101 03/28/2021   CO2 29 03/28/2021   GLUCOSE 125 (H) 03/28/2021   BUN 13 03/28/2021   CREATININE 0.94 03/28/2021   CALCIUM 8.4 (L) 03/28/2021   Lab Results  Component Value Date   INR 0.91 01/05/2012   No results found for: CHOL, HDL, LDLCALC, TRIG   GEN- The patient is well appearing, alert and oriented x 3 today.   Head- normocephalic, atraumatic Eyes-  Sclera clear, conjunctiva pink Ears- hearing intact Oropharynx- clear Neck- supple, no JVP Lymph- no cervical lymphadenopathy Lungs- Clear to ausculation bilaterally, normal work of breathing Heart- Regular rate and rhythm, no murmurs, rubs or gallops, PMI not laterally displaced GI- soft, NT, ND, + BS Extremities- no clubbing, cyanosis, or edema MS- no significant deformity or atrophy Skin- no rash or lesion Psych- euthymic  mood, full affect Neuro- strength and sensation are intact  EKG- NSR at 68 bpm, pr int 166 ms, qrs int 88 ms, qtc 457 ms   Echo -. Left ventricular ejection fraction, by estimation, is 55 to 60%. The  left ventricle has normal function. The left ventricle has no regional  wall motion abnormalities. There is moderate left ventricular hypertrophy.  Left ventricular diastolic  parameters are consistent with Grade II diastolic dysfunction  (pseudonormalization).   2. Right ventricular systolic function is normal. The right ventricular  size is normal. Tricuspid regurgitation signal is inadequate for assessing  PA pressure.   3. Left atrial size was mildly dilated.   4. The mitral valve is normal in structure. Trivial mitral valve  regurgitation. No evidence of mitral stenosis.   5. The aortic valve is abnormal. There is moderate calcification of the  aortic valve. Aortic valve regurgitation is not visualized. Mild aortic  valve sclerosis is present, with no evidence of aortic valve stenosis.   6. The inferior vena cava is normal in size with greater than 50%  respiratory variability, suggesting right atrial pressure of 3 mmHg.   Epic records reviewed   Assessment and Plan: 1. Paroxysmal afib Low burden Continue Toprol 100 mg qd RX 30 mg cardizem to use for breakthrough afib   2. CHA2DS2VASc score of 3 Continue xarelto 20 mg  daily   Labs from Dr. Baldwin Crown office, drawn 04/17/21,  requested   3. Pending colonoscopy/endoscopy  Has never rescheduled  Encouraged to reschedule as she states that her swallowing issues have worsened  She  should be at low risk for a low risk procedure She has been instructed to take Toprol with sip of water am of procedure to prevent rebound tachycardia /afib/HTN  She can hold xarelto 2 days prior to procedure as previously  addressed by Nehemiah Massed, [place]  4. Exertional  dyspnea/ decreased exercise tolerance  States chronic x 3 years Started around  the time of her back surgeries and reduced activity/ability to walk without pain and increased effort If worsens or develops exertional chest discomfort, contact the office and I will order a stress test    I will see in f/u in 6 months   Butch Penny C. Yasuo Phimmasone, [facility] [address] [place], [place] [address] [phone]

## 2021-05-19 DIAGNOSIS — H43813 Vitreous degeneration, bilateral: Secondary | ICD-10-CM | POA: Diagnosis not present

## 2021-05-19 DIAGNOSIS — H47322 Drusen of optic disc, left eye: Secondary | ICD-10-CM | POA: Diagnosis not present

## 2021-05-19 DIAGNOSIS — H43393 Other vitreous opacities, bilateral: Secondary | ICD-10-CM | POA: Diagnosis not present

## 2021-05-19 DIAGNOSIS — H2513 Age-related nuclear cataract, bilateral: Secondary | ICD-10-CM | POA: Diagnosis not present

## 2021-05-29 DIAGNOSIS — H43392 Other vitreous opacities, left eye: Secondary | ICD-10-CM | POA: Diagnosis not present

## 2021-05-29 DIAGNOSIS — H43811 Vitreous degeneration, right eye: Secondary | ICD-10-CM | POA: Diagnosis not present

## 2021-05-29 DIAGNOSIS — H2513 Age-related nuclear cataract, bilateral: Secondary | ICD-10-CM | POA: Diagnosis not present

## 2021-05-29 DIAGNOSIS — H47322 Drusen of optic disc, left eye: Secondary | ICD-10-CM | POA: Diagnosis not present

## 2021-06-05 ENCOUNTER — Other Ambulatory Visit: Payer: Self-pay | Admitting: Gastroenterology

## 2021-06-05 DIAGNOSIS — K219 Gastro-esophageal reflux disease without esophagitis: Secondary | ICD-10-CM

## 2021-06-05 DIAGNOSIS — R1319 Other dysphagia: Secondary | ICD-10-CM

## 2021-06-10 ENCOUNTER — Other Ambulatory Visit: Payer: Self-pay | Admitting: Gastroenterology

## 2021-06-10 ENCOUNTER — Other Ambulatory Visit: Payer: Self-pay

## 2021-06-10 ENCOUNTER — Ambulatory Visit
Admission: RE | Admit: 2021-06-10 | Discharge: 2021-06-10 | Disposition: A | Discharge status: Home / Self Care | Payer: Medicare Other | Source: Ambulatory Visit | Attending: Gastroenterology | Admitting: Gastroenterology

## 2021-06-10 DIAGNOSIS — R1319 Other dysphagia: Secondary | ICD-10-CM

## 2021-06-10 DIAGNOSIS — R1310 Dysphagia, unspecified: Secondary | ICD-10-CM | POA: Diagnosis not present

## 2021-06-10 DIAGNOSIS — K219 Gastro-esophageal reflux disease without esophagitis: Secondary | ICD-10-CM

## 2021-06-11 DIAGNOSIS — Z6835 Body mass index (BMI) 35.0-35.9, adult: Secondary | ICD-10-CM | POA: Diagnosis not present

## 2021-06-11 DIAGNOSIS — M4316 Spondylolisthesis, lumbar region: Secondary | ICD-10-CM | POA: Diagnosis not present

## 2021-06-11 DIAGNOSIS — I10 Essential (primary) hypertension: Secondary | ICD-10-CM | POA: Diagnosis not present

## 2021-06-13 ENCOUNTER — Other Ambulatory Visit: Payer: Medicare Other

## 2021-07-02 DIAGNOSIS — K5730 Diverticulosis of large intestine without perforation or abscess without bleeding: Secondary | ICD-10-CM | POA: Diagnosis not present

## 2021-07-02 DIAGNOSIS — K649 Unspecified hemorrhoids: Secondary | ICD-10-CM | POA: Diagnosis not present

## 2021-07-02 DIAGNOSIS — K2930 Chronic superficial gastritis without bleeding: Secondary | ICD-10-CM | POA: Diagnosis not present

## 2021-07-02 DIAGNOSIS — K3189 Other diseases of stomach and duodenum: Secondary | ICD-10-CM | POA: Diagnosis not present

## 2021-07-02 DIAGNOSIS — Z86010 Personal history of colonic polyps: Secondary | ICD-10-CM | POA: Diagnosis not present

## 2021-07-02 DIAGNOSIS — R1310 Dysphagia, unspecified: Secondary | ICD-10-CM | POA: Diagnosis not present

## 2021-07-02 DIAGNOSIS — R12 Heartburn: Secondary | ICD-10-CM | POA: Diagnosis not present

## 2021-07-02 DIAGNOSIS — K259 Gastric ulcer, unspecified as acute or chronic, without hemorrhage or perforation: Secondary | ICD-10-CM | POA: Diagnosis not present

## 2021-07-02 DIAGNOSIS — K2289 Other specified disease of esophagus: Secondary | ICD-10-CM | POA: Diagnosis not present

## 2021-07-03 ENCOUNTER — Ambulatory Visit (INDEPENDENT_AMBULATORY_CARE_PROVIDER_SITE_OTHER): Payer: Medicare Other

## 2021-07-03 ENCOUNTER — Ambulatory Visit: Payer: Medicare Other | Admitting: Podiatry

## 2021-07-03 ENCOUNTER — Encounter: Payer: Self-pay | Admitting: Podiatry

## 2021-07-03 ENCOUNTER — Other Ambulatory Visit: Payer: Self-pay

## 2021-07-03 DIAGNOSIS — M779 Enthesopathy, unspecified: Secondary | ICD-10-CM

## 2021-07-03 DIAGNOSIS — M7672 Peroneal tendinitis, left leg: Secondary | ICD-10-CM | POA: Diagnosis not present

## 2021-07-03 MED ORDER — TRIAMCINOLONE ACETONIDE 10 MG/ML IJ SUSP
10.0000 mg | Freq: Once | INTRAMUSCULAR | Status: AC
  Administered 2021-07-03: 10 mg

## 2021-07-03 NOTE — Progress Notes (Signed)
Subjective:   Patient ID: Kathryn [name], female   DOB: 65 y.o.   MRN: [id number]   HPI Patient presents stating she has had a lot of pain in the outside of her left foot for about the last 4 months.  States that it sharp and shoots into the lower leg and also she has what she considers flatfoot deformity of both feet.  Patient does have mild chronic pain but this new pain has been really discomforting left.  Patient does not smoke and would like to be more active   Review of Systems  All other systems reviewed and are negative.      Objective:  Physical Exam Vitals and nursing note reviewed.  Constitutional:      Appearance: She is well-developed.  Pulmonary:     Effort: Pulmonary effort is normal.  Musculoskeletal:        General: Normal range of motion.  Skin:    General: Skin is warm.  Neurological:     Mental Status: She is alert.    Neurovascular status intact muscle strength adequate range of motion adequate with exquisite discomfort in the outside of the left foot around the peroneal tendon complex as it comes under lateral malleolus and inserts fifth metatarsal base.  There is mild swelling not significant and the patient does have moderate depression of the arch and does have some compensatory reduction of motion left due to the pain as a try to move the foot.  Good digital perfusion well oriented x3     Assessment:  Acute peroneal tendinitis left with inflammation     Plan:  H&P reviewed all conditions x-rays and I went ahead for the left did sterile prep and injected the peroneal complex near insertion 3 mg Kenalog 5 mg Xylocaine into the sheath and applied fascial brace to lift up the lateral side of the foot keep it immobilized along with ice therapy and topical diclofenac.  Placed on oral anti-inflammatory reappoint to recheck again  X-rays indicated no signs of fracture associated with this left over right moderate arthritis midfoot bilateral moderate bunion  deformity bilateral

## 2021-07-15 ENCOUNTER — Telehealth: Payer: Self-pay | Admitting: Internal Medicine

## 2021-07-15 DIAGNOSIS — H2513 Age-related nuclear cataract, bilateral: Secondary | ICD-10-CM | POA: Diagnosis not present

## 2021-07-15 DIAGNOSIS — H43813 Vitreous degeneration, bilateral: Secondary | ICD-10-CM | POA: Diagnosis not present

## 2021-07-15 DIAGNOSIS — H18413 Arcus senilis, bilateral: Secondary | ICD-10-CM | POA: Diagnosis not present

## 2021-07-15 DIAGNOSIS — H25043 Posterior subcapsular polar age-related cataract, bilateral: Secondary | ICD-10-CM | POA: Diagnosis not present

## 2021-07-15 NOTE — Telephone Encounter (Signed)
Patient calling the office for samples of medication:   1.  What medication and dosage are you requesting samples for? rivaroxaban (XARELTO) 20 MG TABS tablet  2.  Are you currently out of this medication? Pt is completely out of this medicine. Husband states he can come into the office in the morning to pick up the samples if that's ok Pt would like to know if it is any pharmacy assistance paperwork he can complete. The medicine is normally $39.00 but when he went to  pick up the prescription from the pharmacy it was $129.00 and he can not afford that. Pt's husband wants to know if he can get samples for the next 3 months.

## 2021-07-16 NOTE — Telephone Encounter (Signed)
Left message samples at front desk along with patient assistance forms.

## 2021-07-22 ENCOUNTER — Telehealth: Payer: Self-pay | Admitting: *Deleted

## 2021-07-22 ENCOUNTER — Other Ambulatory Visit (HOSPITAL_COMMUNITY): Payer: Self-pay

## 2021-07-22 MED ORDER — RIVAROXABAN 20 MG PO TABS: 20.0000 mg | ORAL_TABLET | Freq: Every day | ORAL | 0 refills | Status: DC

## 2021-07-22 NOTE — Telephone Encounter (Signed)
VM left by this patient stating she was seen by Dr Jana Hakim " a couple of years ago and now I just saw a doctor who told me I have cancer in my esophagus"  " So who do I need to see- go back to them or a surgeon "  Return call number given as [phone].  This RN returned call to the patient obtained identified VM.  Message left informing pt of need to follow up presently with the doctor who has diagnosed her first- they should know how to refer her to the correct doctors.  Note no information in Epic regarding above diagnosis.  This RN left her name and main office number if she has further questions.

## 2021-08-13 DIAGNOSIS — I10 Essential (primary) hypertension: Secondary | ICD-10-CM | POA: Diagnosis not present

## 2021-08-13 DIAGNOSIS — M4316 Spondylolisthesis, lumbar region: Secondary | ICD-10-CM | POA: Diagnosis not present

## 2021-08-13 DIAGNOSIS — Z6835 Body mass index (BMI) 35.0-35.9, adult: Secondary | ICD-10-CM | POA: Diagnosis not present

## 2021-08-21 DIAGNOSIS — K648 Other hemorrhoids: Secondary | ICD-10-CM | POA: Diagnosis not present

## 2021-08-21 DIAGNOSIS — R1310 Dysphagia, unspecified: Secondary | ICD-10-CM | POA: Diagnosis not present

## 2021-08-21 DIAGNOSIS — D123 Benign neoplasm of transverse colon: Secondary | ICD-10-CM | POA: Diagnosis not present

## 2021-08-21 DIAGNOSIS — Z86010 Personal history of colonic polyps: Secondary | ICD-10-CM | POA: Diagnosis not present

## 2021-08-25 DIAGNOSIS — H2512 Age-related nuclear cataract, left eye: Secondary | ICD-10-CM | POA: Diagnosis not present

## 2021-08-26 DIAGNOSIS — H2511 Age-related nuclear cataract, right eye: Secondary | ICD-10-CM | POA: Diagnosis not present

## 2021-08-26 DIAGNOSIS — H2512 Age-related nuclear cataract, left eye: Secondary | ICD-10-CM | POA: Diagnosis not present

## 2021-09-09 ENCOUNTER — Telehealth: Payer: Self-pay | Admitting: Internal Medicine

## 2021-09-09 DIAGNOSIS — Z5181 Encounter for therapeutic drug level monitoring: Secondary | ICD-10-CM

## 2021-09-09 DIAGNOSIS — I4891 Unspecified atrial fibrillation: Secondary | ICD-10-CM

## 2021-09-09 NOTE — Telephone Encounter (Signed)
Spoke with husband, DPR on file.  Pt currently on Xarelto but unable to afford the cost.  It is now $120 for a 1 month supply.  Was previously given samples and assistance forms.  Husband states they did not fill out the forms and just wanted to simply change her to ASA.  Advised ASA does not give the coverage she needs.  Spoke with Jinny Blossom, [facility] and she told me about a program with [employer] that can take the cost down to $85.  Husband states they can't afford that either.  Discussed changing to warfarin.  Explained about getting INR checked and the need to watch her diet.  They were agreeable to pt switching.  Advised I will place a bottle of Xarelto at the front desk for them to pick up and send a message to the Coumadin team to reach out to pt to discuss transitioning to Coumadin.  Pt is completely out of Xarelto right now.    Husband also mentioned that pt is having more frequent episodes of AF that are causing increased fatigue and SOB.  They are brief and pt converts with PRN Diltiazem after about 20-30 mins.  Scheduled pt to be seen in AF Clinic on 10/25 (first available).  Husband very appreciative for assistance.

## 2021-09-09 NOTE — Telephone Encounter (Signed)
Patient's spouse would like to speak to Dr. Jackalyn Lombard nurse.

## 2021-09-10 NOTE — Telephone Encounter (Signed)
Returned a call to the pt and husband, Kathryn [name], explained that we would need to overlap Warfarin and Xarelto for 3 days. He states they just picked up the  Xarelto samples a little bit ago. Advised that in the beginning Warfarin does require weekly monitoring and dose adjustment. He asked how much is the the co-pay and advised that I do not have that information but the billing department would be able to assist. He stated he would speak to them first and that he would be appreciate my call back. Advised I would call him back tomorrow and see if he gets his answer to the co-pay amount and set up overlapping and new patient appointment.

## 2021-09-11 MED ORDER — WARFARIN SODIUM 5 MG PO TABS: ORAL_TABLET | ORAL | 0 refills | Status: DC

## 2021-09-11 NOTE — Telephone Encounter (Signed)
Returned call to the pt and wife, they placed me on speaker phone so both could hear. Husband states he called the billing department and they did not know how much it would cost, he states that they told him to call the insurance department to obtain the information. He stated the insurance company told him that with the codes they were given the visit would be no charge on their behave. Asked what codes he gave them and he stated they were not at home so he did not have that in from of him, asked him to bring to appointment.   Pt and husband willing to start Warfarin 5mg  and overlap with Xarelto 20mg  for 3 days; instructed pt to take both Warfarin and Xarelto on 10/23, 10/24, 10/25, the Xarelto will be complete on 10/25 and pt will continue Warfarin 5mg  at 5pm on 10/26, 10/27, and report to new patient appt on 10/28 at 1030am. Advised bleeding is a risk and should monitor just like she was doing while on Xarelto and they verbalized understanding. Husband states pt does not eat a lot of leafy veggies and advised we will definitely discuss at visit. They are aware the initial visit will take 30-40 minutes. They will pick up Warfarin refill that will be sent to requested pharmacy.   Husband states that the pt will be out the donut hole in January 2023 according to what he was told by [employer].

## 2021-09-12 DIAGNOSIS — H2511 Age-related nuclear cataract, right eye: Secondary | ICD-10-CM | POA: Diagnosis not present

## 2021-09-16 ENCOUNTER — Ambulatory Visit (HOSPITAL_COMMUNITY): Payer: Medicare Other | Admitting: Nurse Practitioner

## 2021-09-23 ENCOUNTER — Ambulatory Visit (HOSPITAL_COMMUNITY): Payer: Medicare Other | Admitting: Nurse Practitioner

## 2021-09-23 ENCOUNTER — Encounter (HOSPITAL_COMMUNITY): Payer: Self-pay

## 2021-09-24 ENCOUNTER — Telehealth: Payer: Self-pay | Admitting: *Deleted

## 2021-09-24 ENCOUNTER — Telehealth (HOSPITAL_COMMUNITY): Payer: Self-pay | Admitting: *Deleted

## 2021-09-24 MED ORDER — RIVAROXABAN 20 MG PO TABS: 20.0000 mg | ORAL_TABLET | Freq: Every day | ORAL | Status: DC

## 2021-09-24 NOTE — Telephone Encounter (Signed)
Pt called in stating she cannot take warfarin accidentally missed her appt yesterday as well. States she is having headaches, stomach ache and has not taken anything anticoagulation wise since Saturday. Discussed with [name] NP will resume Xarelto 20mg  once a day and provide samples until seen next week. Pt will not take any more coumadin. Pt will come pickup samples

## 2021-09-24 NOTE — Telephone Encounter (Signed)
Pt called to reschedule her missed Anticoagulation Appt. She staes she has not been taking the Warfarin since this past Saturday, 09/20/21. Advised that the Warfarin is the anticoagulation she was prescribed since she cannot afford the Xarelto. Advised that since she not taking any anticoagulation medication, she is at risk for having a blood clot or stroke without any protection and she verbalized understanding. She asked if she could start another medication, asked Melissa the Pharmacist and she stated it would probably be the same for Eliquis. Also, read the note where the pt could not afford the $85 through [place] so that's the reason she had to switch to Warfarin.  She states she the Warfarin made her have muscle aches, headache, and just not feel right. She states this has happened in the past as well when she took warfarin (we have never managed her while on warfarin). Again, stressed the importance of taking the warfarin as prescribed to reduce the risk of blood clot or stroke.   Advised pt she had missed her Appt with [name] on yesterday as well. She will call them and also ask about alternatives per [name]. Advised that if she does plan to restart the Warfarin we need to know so we can get her an appointment in at least 5-6 days after resuming. She states she will call back for an update with her plan.

## 2021-09-24 NOTE — Telephone Encounter (Signed)
**Note De-Identified Tollie [place] Obfuscation** Ok to give Xarelto samples until the pts appointment with [name], NP on 11/9 and will address cost, if needed, after that appt.

## 2021-09-24 NOTE — Telephone Encounter (Signed)
Xarelto 20mg  is the correct dose; pt is 65 yrs old, wt-100.5kg, Crea-0.94 on 03/28/2021, CrCl-92.16ml/min, last seen by [name] on 05/13/21.    Warfarin d/c'd per Orson Eva.   Samples are not handled by Anticoagulation Clinic; will [facility]-where she has been going.

## 2021-09-25 ENCOUNTER — Other Ambulatory Visit (HOSPITAL_COMMUNITY): Payer: Self-pay

## 2021-09-25 MED ORDER — RIVAROXABAN 20 MG PO TABS: 20.0000 mg | ORAL_TABLET | Freq: Every day | ORAL | 0 refills | Status: DC

## 2021-09-29 ENCOUNTER — Other Ambulatory Visit (HOSPITAL_COMMUNITY): Payer: Self-pay

## 2021-09-29 MED ORDER — RIVAROXABAN 20 MG PO TABS: 20.0000 mg | ORAL_TABLET | Freq: Every day | ORAL | 0 refills | Status: DC

## 2021-10-01 ENCOUNTER — Encounter (HOSPITAL_COMMUNITY): Payer: Self-pay | Admitting: Nurse Practitioner

## 2021-10-01 ENCOUNTER — Ambulatory Visit (HOSPITAL_COMMUNITY)
Admission: RE | Admit: 2021-10-01 | Discharge: 2021-10-01 | Disposition: A | Discharge status: Home / Self Care | Payer: Medicare Other | Source: Ambulatory Visit | Attending: Nurse Practitioner | Admitting: Nurse Practitioner

## 2021-10-01 VITALS — BP 150/108 | HR 64 | Ht 66.0 in | Wt 225.8 lb

## 2021-10-01 DIAGNOSIS — R0602 Shortness of breath: Secondary | ICD-10-CM | POA: Diagnosis not present

## 2021-10-01 DIAGNOSIS — D649 Anemia, unspecified: Secondary | ICD-10-CM | POA: Diagnosis not present

## 2021-10-01 DIAGNOSIS — Z79899 Other long term (current) drug therapy: Secondary | ICD-10-CM | POA: Insufficient documentation

## 2021-10-01 DIAGNOSIS — I4891 Unspecified atrial fibrillation: Secondary | ICD-10-CM | POA: Diagnosis not present

## 2021-10-01 DIAGNOSIS — D6869 Other thrombophilia: Secondary | ICD-10-CM | POA: Diagnosis not present

## 2021-10-01 DIAGNOSIS — R609 Edema, unspecified: Secondary | ICD-10-CM | POA: Insufficient documentation

## 2021-10-01 DIAGNOSIS — R42 Dizziness and giddiness: Secondary | ICD-10-CM | POA: Insufficient documentation

## 2021-10-01 DIAGNOSIS — I10 Essential (primary) hypertension: Secondary | ICD-10-CM | POA: Insufficient documentation

## 2021-10-01 DIAGNOSIS — I480 Paroxysmal atrial fibrillation: Secondary | ICD-10-CM | POA: Diagnosis not present

## 2021-10-01 LAB — CBC
HCT: 31.8 % — ABNORMAL LOW (ref 36.0–46.0)
Hemoglobin: 8.8 g/dL — ABNORMAL LOW (ref 12.0–15.0)
MCH: 22.3 pg — ABNORMAL LOW (ref 26.0–34.0)
MCHC: 27.7 g/dL — ABNORMAL LOW (ref 30.0–36.0)
MCV: 80.7 fL (ref 80.0–100.0)
Platelets: 226 10*3/uL (ref 150–400)
RBC: 3.94 MIL/uL (ref 3.87–5.11)
RDW: 18.4 % — ABNORMAL HIGH (ref 11.5–15.5)
WBC: 4.5 10*3/uL (ref 4.0–10.5)
nRBC: 0 % (ref 0.0–0.2)

## 2021-10-01 LAB — BASIC METABOLIC PANEL
Anion gap: 7 (ref 5–15)
BUN: 12 mg/dL (ref 8–23)
CO2: 30 mmol/L (ref 22–32)
Calcium: 9 mg/dL (ref 8.9–10.3)
Chloride: 101 mmol/L (ref 98–111)
Creatinine, Ser: 0.83 mg/dL (ref 0.44–1.00)
GFR, Estimated: >60 mL/min (ref >60)
Glucose, Bld: 99 mg/dL (ref 70–99)
Potassium: 4.1 mmol/L (ref 3.5–5.1)
Sodium: 138 mmol/L (ref 135–145)

## 2021-10-01 MED ORDER — HYDROCHLOROTHIAZIDE 25 MG PO TABS: 25.0000 mg | ORAL_TABLET | Freq: Every day | ORAL | 2 refills | Status: DC

## 2021-10-01 NOTE — Addendum Note (Signed)
Encounter addended by: Enid Derry, CMA on: 10/01/2021 10:51 AM  Actions taken: Allergies modified

## 2021-10-01 NOTE — Patient Instructions (Signed)
Start HCTZ 25mg  once a day

## 2021-10-01 NOTE — Progress Notes (Signed)
Primary Care Physician: [name], MD Referring Physician: Dr. Leane Platt is a 65 y.o. female with a h/o palpitations that recently saw [name] for afib/HTN that was noted at time of scheduled endoscopy/colonoscopy. Of note, she did not take her BB dose prior to procedure that day. The procedure was cancelled. She was sent to the ER and she  left 2/2 long wait in the lobby. She was feeling improved. She saw Dr. Rayann Heman in f/u and he increased her BB and scheduled an echo which is pending tomorrow. He also started her on xarelto 20 mg daily for a CHA2DS2VASc score of 3. She has not had any issues with the change in  her meds. Colonoscopy/endoscopy was scheduled as she saw blood in her stool in the past but has not seen any with start of DOAC. She also has issues with food getting hung from  time to time.  She still feels minutes of palpitations but no extended periods of afib. She limits caffeine, no alcohol or tobacco and husband reports some snoring but no apnea. She is in rhythm today.   F/u in afib clinic, 05/13/21. She has only had one episode of afib last week, she took a Cardizem right away and it was over in 15 mins. She has not had her endo/colonoscopy rescheduled yet. She went on to have repeat back surgery in May.  She states that ever since she started having back surgery 3 years ago, she has not been able to walk very long distances and has noted exertional dyspnea with this. No chest discomfort. Unchanged over the last 3 years. Very sedentary. No issues with blood thinner. Still having issues with allowing.   F/u in afib clinic, 10/01/21. She is here as she had a afib episode last week. In SR today. She has also noted elevated BP's at home. She also feels lightheaded and short of breath at times. Her weight is up around 4-5 lbs and she has noted her ankles are swollen. She could not afford xarelto 2/2 being in the [place]ut [place] so went over to coumadin but noted side  effects and  went back to xarelto. I note she had a low HBG last May of 8.4 and I do not see in Epic or Care Everywhere that she had a f/u CBC. She did have a colonoscopy around that time and had several polyps removed. Her BP is elevated today.    Today, she denies symptoms of palpitations, chest pain, shortness of breath, orthopnea, PND, lower extremity edema, dizziness, presyncope, syncope, or neurologic sequela. The patient is tolerating medications without difficulties and is otherwise without complaint today.   Past Medical History:  Diagnosis Date   Anxiety    Cancer ([place]) 10/2016   Breast right,  lumpectomy,  did not require chemo/xrt   DDD (degenerative disc disease), cervical    Depression    Dyspnea    Elevated cholesterol    GERD (gastroesophageal reflux disease)    H/O hiatal hernia    Hypertension    Hypothyroidism    Paroxysmal atrial fibrillation ([facility]) 11/20/2020   PONV (postoperative nausea and vomiting)    felt cut with one of past knee surgeries   Tremor    patient states "tremor has gotten better" on 06/30/19   Wears partial dentures    upper   Past Surgical History:  Procedure Laterality Date   ABDOMINAL HYSTERECTOMY  yrs ago   ANTERIOR LAT LUMBAR FUSION N/A 11/21/2019  Procedure: Lumbar two-three Lumbar three-four Anterolateral lumbar interbody fusion with lateral plate;  Surgeon: Kristeen Miss, MD;  Location: [facility];  Service: Neurosurgery;  Laterality: N/A;   BREAST EXCISIONAL BIOPSY Right 11/2016   ALH and papillomas   BREAST LUMPECTOMY WITH RADIOACTIVE SEED LOCALIZATION Right 12/15/2016   Procedure: RADIOACTIVE SEED X 2 GUIDED EXCISIONAL BREAST BIOPSY;  Surgeon: Rolm Bookbinder, MD;  Location: [facility];  Service: General;  Laterality: Right;   CARPAL TUNNEL RELEASE Bilateral yrs ago   x 2   cervical neck fusion  yrs ago   C 3 and C 4    COLONOSCOPY     left knee arthroscopy   last done 2008   x 3   left shoulder arthroscopy  yrs ago    lower back surgery  2005   right total knee replacement   2008   TOTAL KNEE ARTHROPLASTY  01/14/2012   Procedure: TOTAL KNEE ARTHROPLASTY;  Surgeon: Johnn Hai, MD;  Location: [facility];  Service: Orthopedics;  Laterality: Left;  preop femoral nerve block   UPPER GI ENDOSCOPY     WISDOM TOOTH EXTRACTION      Current Outpatient Medications  Medication Sig Dispense Refill   acetaminophen (TYLENOL) 500 MG tablet Take 500-1,000 mg by mouth every 6 (six) hours as needed for moderate pain.     ALPRAZolam (XANAX) 0.25 MG tablet Take 0.25 mg by mouth at bedtime.     Calcium Carb-Cholecalciferol (CALCIUM 600 + D PO) Take 600 mg by mouth daily.     Cholecalciferol (VITAMIN D3) 25 MCG (1000 UT) CAPS Take 1,000 Units by mouth daily.     diltiazem (CARDIZEM) 30 MG tablet Take 1 tablet every 4 hours AS NEEDED for heart rate >100 as long as blood pressure >100. (Patient taking differently: Take 30 mg by mouth See admin instructions. Take 30 mg tablet every 4 hours AS NEEDED for heart rate >100 as long as blood pressure >100.) 30 tablet 1   DULoxetine (CYMBALTA) 30 MG capsule Take 30 mg by mouth See admin instructions. Take with 60 mg to equal 90 mg at night     DULoxetine (CYMBALTA) 60 MG capsule Take 60 mg by mouth See admin instructions. Take with 30 mg to equal 90 mg at night     gabapentin (NEURONTIN) 300 MG capsule Take 1 capsule by mouth 3 (three) times daily.     hydrochlorothiazide (HYDRODIURIL) 25 MG tablet Take 1 tablet (25 mg total) by mouth daily. 30 tablet 2   levothyroxine (SYNTHROID) 200 MCG tablet Take 200 mcg by mouth daily before breakfast.     methocarbamol (ROBAXIN) 500 MG tablet Take 1 tablet (500 mg total) by mouth every 6 (six) hours as needed for muscle spasms. 40 tablet 3   metoprolol succinate (TOPROL-XL) 100 MG 24 hr tablet Take 1 tablet (100 mg total) by mouth daily. Take with or immediately following a meal. 90 tablet 3   omeprazole (PRILOSEC OTC) 20 MG tablet Take 20 mg by  mouth daily.     oxyCODONE-acetaminophen (PERCOCET/ROXICET) 5-325 MG tablet Take 1-2 tablets by mouth every 4 (four) hours as needed for moderate pain or severe pain. 60 tablet 0   rivaroxaban (XARELTO) 20 MG TABS tablet Take 1 tablet (20 mg total) by mouth daily with supper. 28 tablet 0   tamoxifen (NOLVADEX) 20 MG tablet TAKE 1 TABLET BY MOUTH DAILY (Patient taking differently: Take 20 mg by mouth daily.) 90 tablet 12   No current facility-administered  medications for this encounter.    No Known Allergies  Social History   Socioeconomic History   Marital status: Married    Spouse name: Not on file   Number of children: Not on file   Years of education: Not on file   Highest education level: Not on file  Occupational History   Not on file  Tobacco Use   Smoking status: Never   Smokeless tobacco: Never  Vaping Use   Vaping Use: Never used  Substance and Sexual Activity   Alcohol use: Yes    Comment: occasional wine   Drug use: No   Sexual activity: Not on file    Comment: Hysterectormy  Other Topics Concern   Not on file  Social History Narrative   Lives in [place] with spouse.   Retired Nurse, learning disability   Social Determinants of Radio broadcast assistant Strain: Not on [employer] Insecurity: Not on file  Transportation Needs: Not on file  Physical Activity: Not on file  Stress: Not on file  Social Connections: Not on file  Intimate Partner Violence: Not on file    No family history on file.  ROS- All systems are reviewed and negative except as per the HPI above  Physical Exam: Vitals:   10/01/21 0903  BP: (!) 150/108  Pulse: 64  Weight: 102.4 kg  Height: 5\' 6"  (1.676 m)   Wt Readings from Last 3 Encounters:  10/01/21 102.4 kg  05/13/21 100.5 kg  03/27/21 100.2 kg    Labs: Lab Results  Component Value Date   NA 138 03/28/2021   K 4.5 03/28/2021   CL 101 03/28/2021   CO2 29 03/28/2021   GLUCOSE 125 (H) 03/28/2021   BUN 13 03/28/2021    CREATININE 0.94 03/28/2021   CALCIUM 8.4 (L) 03/28/2021   Lab Results  Component Value Date   INR 0.91 01/05/2012   No results found for: CHOL, HDL, LDLCALC, TRIG   GEN- The patient is well appearing, alert and oriented x 3 today.   Head- normocephalic, atraumatic Eyes-  Sclera clear, conjunctiva pink Ears- hearing intact Oropharynx- clear Neck- supple, no JVP Lymph- no cervical lymphadenopathy Lungs- Clear to ausculation bilaterally, normal work of breathing Heart- Regular rate and rhythm, no murmurs, rubs or gallops, PMI not laterally displaced GI- soft, NT, ND, + BS Extremities- no clubbing, cyanosis, or edema MS- no significant deformity or atrophy Skin- no rash or lesion Psych- euthymic mood, full affect Neuro- strength and sensation are intact  EKG- NSR at 64 bpm, pr int 160 ms, qrs int 90 ms, qtc 466 ms   Echo -. Left ventricular ejection fraction, by estimation, is 55 to 60%. The  left ventricle has normal function. The left ventricle has no regional  wall motion abnormalities. There is moderate left ventricular hypertrophy.  Left ventricular diastolic  parameters are consistent with Grade II diastolic dysfunction  (pseudonormalization).   2. Right ventricular systolic function is normal. The right ventricular  size is normal. Tricuspid regurgitation signal is inadequate for assessing  PA pressure.   3. Left atrial size was mildly dilated.   4. The mitral valve is normal in structure. Trivial mitral valve  regurgitation. No evidence of mitral stenosis.   5. The aortic valve is abnormal. There is moderate calcification of the  aortic valve. Aortic valve regurgitation is not visualized. Mild aortic  valve sclerosis is present, with no evidence of aortic valve stenosis.   6. The inferior vena cava  is normal in size with greater than 50%  respiratory variability, suggesting right atrial pressure of 3 mmHg.   Epic records reviewed   Assessment and Plan: 1.  Paroxysmal afib Elevated BP may be contributing tp episodes  Low burden overall  Continue Toprol 100 mg qd RX 30 mg cardizem to use for breakthrough afib   2. CHA2DS2VASc score of 3 Went to coumadin due to expense in the donut hole but had side effects and went back to xarelto last week  Continue xarelto 20 mg  daily   3. Anemia  Anemic last May and cannot see f/u CBC Many of her symptoms could be from  anemia Cbc today   4. HTN/pedal edema  Elevated today and has noted elevated readings at home as well  Start HCTZ 25 mg a day Bmet today Check intermittently at home  Will see back next week for BP check and repeat bmet on HCTZ If afib continues to  escalate, I will consider increased BB or talk to pt re antiarrythmic's  Butch Penny C. Amahd Morino, [facility] [address] [place], [place] [address] [phone]

## 2021-10-02 ENCOUNTER — Other Ambulatory Visit (HOSPITAL_COMMUNITY): Payer: Self-pay

## 2021-10-02 MED ORDER — RIVAROXABAN 20 MG PO TABS: 20.0000 mg | ORAL_TABLET | Freq: Every day | ORAL | 0 refills | Status: DC

## 2021-10-08 ENCOUNTER — Ambulatory Visit (HOSPITAL_COMMUNITY)
Admission: RE | Admit: 2021-10-08 | Discharge: 2021-10-08 | Disposition: A | Discharge status: Home / Self Care | Payer: Medicare Other | Source: Ambulatory Visit | Attending: Nurse Practitioner | Admitting: Nurse Practitioner

## 2021-10-08 ENCOUNTER — Other Ambulatory Visit: Payer: Self-pay

## 2021-10-08 ENCOUNTER — Encounter (HOSPITAL_COMMUNITY): Payer: Medicare Other | Admitting: Nurse Practitioner

## 2021-10-08 DIAGNOSIS — I10 Essential (primary) hypertension: Secondary | ICD-10-CM | POA: Diagnosis not present

## 2021-10-08 DIAGNOSIS — R600 Localized edema: Secondary | ICD-10-CM | POA: Diagnosis not present

## 2021-10-08 LAB — BASIC METABOLIC PANEL
Anion gap: 12 (ref 5–15)
BUN: 13 mg/dL (ref 8–23)
CO2: 28 mmol/L (ref 22–32)
Calcium: 9.6 mg/dL (ref 8.9–10.3)
Chloride: 96 mmol/L — ABNORMAL LOW (ref 98–111)
Creatinine, Ser: 0.94 mg/dL (ref 0.44–1.00)
GFR, Estimated: >60 mL/min (ref >60)
Glucose, Bld: 122 mg/dL — ABNORMAL HIGH (ref 70–99)
Potassium: 3.9 mmol/L (ref 3.5–5.1)
Sodium: 136 mmol/L (ref 135–145)

## 2021-10-13 DIAGNOSIS — M4316 Spondylolisthesis, lumbar region: Secondary | ICD-10-CM | POA: Diagnosis not present

## 2021-10-14 ENCOUNTER — Other Ambulatory Visit: Payer: Self-pay | Admitting: Neurological Surgery

## 2021-10-14 DIAGNOSIS — M4316 Spondylolisthesis, lumbar region: Secondary | ICD-10-CM

## 2021-10-22 DIAGNOSIS — I480 Paroxysmal atrial fibrillation: Secondary | ICD-10-CM | POA: Diagnosis not present

## 2021-10-22 DIAGNOSIS — E039 Hypothyroidism, unspecified: Secondary | ICD-10-CM | POA: Diagnosis not present

## 2021-10-22 DIAGNOSIS — I700 Atherosclerosis of aorta: Secondary | ICD-10-CM | POA: Diagnosis not present

## 2021-10-22 DIAGNOSIS — I10 Essential (primary) hypertension: Secondary | ICD-10-CM | POA: Diagnosis not present

## 2021-10-27 ENCOUNTER — Other Ambulatory Visit: Payer: Self-pay | Admitting: Oncology

## 2021-10-28 ENCOUNTER — Telehealth: Payer: Self-pay | Admitting: Hematology and Oncology

## 2021-10-28 NOTE — Telephone Encounter (Signed)
Scheduled appt per 12/5 referral. Pt is aware of appt date and time.

## 2021-10-29 DIAGNOSIS — K219 Gastro-esophageal reflux disease without esophagitis: Secondary | ICD-10-CM | POA: Diagnosis not present

## 2021-10-29 DIAGNOSIS — R12 Heartburn: Secondary | ICD-10-CM | POA: Diagnosis not present

## 2021-10-29 DIAGNOSIS — R1310 Dysphagia, unspecified: Secondary | ICD-10-CM | POA: Diagnosis not present

## 2021-10-29 DIAGNOSIS — K2289 Other specified disease of esophagus: Secondary | ICD-10-CM | POA: Diagnosis not present

## 2021-10-31 DIAGNOSIS — K219 Gastro-esophageal reflux disease without esophagitis: Secondary | ICD-10-CM | POA: Diagnosis not present

## 2021-10-31 DIAGNOSIS — R059 Cough, unspecified: Secondary | ICD-10-CM | POA: Diagnosis not present

## 2021-11-10 NOTE — Progress Notes (Signed)
[place] NOTE  Patient Care Team: Kathryn Bowen, MD as PCP - General (Endocrinology) [name], Kathryn Dad, MD as Consulting Physician (Oncology) Kathryn Bookbinder, MD as Consulting Physician (General Surgery) Kathryn Miss, MD as Consulting Physician (Neurosurgery) Kathryn Spates, MD (Inactive) as Consulting Physician (Gastroenterology)  CHIEF COMPLAINTS/PURPOSE OF CONSULTATION:  [facility]  ASSESSMENT & PLAN:  No problem-specific Assessment & Plan notes found for this encounter.  Orders Placed This Encounter  Procedures   MM Digital Diagnostic Unilat L    Standing Status:   Future    Standing Expiration Date:   11/11/2022    Order Specific Question:   Reason for Exam (SYMPTOM  OR DIAGNOSIS REQUIRED)    Answer:   some abnormal density left breast reported by the patient, do definitive palpable mass, but some abnormal density which is likely fatty tissue in left breast at around 2 o clock    Order Specific Question:   Preferred imaging location?    Answer:   [facility]   This is a very pleasant 65 year old postmenopausal female patient with right breast atypical lobular hyperplasia status post right retroareolar lumpectomy in January 2018 followed by prophylactic tamoxifen since March 2018 followed with yearly mammogram reestablishing with high risk breast clinic since she was lost to follow-up. She has been doing well on tamoxifen, no adverse effects reported. Physical examination today without any major concerns, some abnormal density in the left breast upper outer quadrant which is not very concerning, most likely fatty tissue and appears to be overdue for mammogram, hence we will proceed with mammogram. She will continue tamoxifen until March 2023 when she will complete 5 years of treatment.  She will return to clinic in about 4 weeks for follow-up with me to review mammogram results and for repeat physical exam. We have discussed about surveillance, follow-up  with high risk breast clinic, role of monthly self breast exam, questionable role of MRIs and surveillance of atypical lobular hyperplasia. Thank you for consulting [place] in the care of this patient.  Please do not hesitate to contact us with any additional questions or concerns.  Breast cancer surveillance: Annual mammograms, consider breast MRI because a lifetime risk of breast cancer risk of more than 20% along with breast exams.  HISTORY OF PRESENTING ILLNESS:  Kathryn [name] 65 y.o. female is here because of [place].  [name] noted a discharge from her right nipple approximately December 2016. Sometime in November 2017 it became more bloody in appearance. Note that the patient had not had a mammogram for more than 10 years.   She was referred to the breast Center where on 11/02/2016 she underwent bilateral diagnostic mammography with tomography and right breast ultrasonography. The breast density was category B. In the retroareolar right breast there was a discrete circumscribed oval nodule. There was also mild duct ectasia on the right behind the nipple. There were nosuspicious calcificationsin either breast in the left breast was unremarkable. On physical exam there was a small amount of clear discharge elicited from the right nipple. Ultrasound showed a dilated duct in the right breast 12:00 position as well as an intraductal mass, and a second intraductal mass in a dilated duct at the & oclock position>  Biopsy of these masses was obtained 11/04/2016, and this showed ([id number]) intraductal papilloma at 7:00 and atypical lobular hyperplasia with papilloma at the 12:00 position.   Accordingly on 12/15/2016 the patient proceeded to right lumpectomy 2, the more lateral sample showing ductal papilloma, with no  evidence of malignancy, and the right retroareolar sample showing ductal papilloma with focal atypical lobular hyperplasia.  She was started on prophylactic tamoxifen March 2018.  She has  been tolerating tamoxifen well.  She has not followed up with Dr. Pablo [name] in a few years.  She is reestablishing with high risk breast clinic for follow-up and additional recommendations.  She denies any new health complaints.  She sometimes checks her breasts and denies any reportable masses except for some lumpiness in the left breast in the upper outer quadrant.  No nipple changes.  She denies any family history of breast cancer. Rest of the pertinent 10 point ROS pertinent for some back pain and muscle spasms, otherwise unremarkable.  She has 2 daughters, she gave birth to her first daughter at the age of 52.  She did not breast-feed.  No long-term use of birth control or hormone replacement therapy.  No family history of breast ovarian, pancreatic cancer, melanoma's.  MEDICAL HISTORY:  Past Medical History:  Diagnosis Date   Anxiety    Cancer ([place]) 10/2016   Breast right,  lumpectomy,  did not require chemo/xrt   DDD (degenerative disc disease), cervical    Depression    Dyspnea    Elevated cholesterol    GERD (gastroesophageal reflux disease)    H/O hiatal hernia    Hypertension    Hypothyroidism    Paroxysmal atrial fibrillation ([place]) 11/20/2020   PONV (postoperative nausea and vomiting)    felt cut with one of past knee surgeries   Tremor    patient states "tremor has gotten better" on 06/30/19   Wears partial dentures    upper    SURGICAL HISTORY: Past Surgical History:  Procedure Laterality Date   ABDOMINAL HYSTERECTOMY  yrs ago   ANTERIOR LAT LUMBAR FUSION N/A 11/21/2019   Procedure: Lumbar two-three Lumbar three-four Anterolateral lumbar interbody fusion with lateral plate;  Surgeon: Kathryn Miss, MD;  Location: [facility];  Service: Neurosurgery;  Laterality: N/A;   BREAST EXCISIONAL BIOPSY Right 11/2016   ALH and papillomas   BREAST LUMPECTOMY WITH RADIOACTIVE SEED LOCALIZATION Right 12/15/2016   Procedure: RADIOACTIVE SEED X 2 GUIDED EXCISIONAL BREAST BIOPSY;  Surgeon:  Kathryn Bookbinder, MD;  Location: [facility];  Service: General;  Laterality: Right;   CARPAL TUNNEL RELEASE Bilateral yrs ago   x 2   cervical neck fusion  yrs ago   C 3 and C 4    COLONOSCOPY     left knee arthroscopy   last done 2008   x 3   left shoulder arthroscopy  yrs ago   lower back surgery  2005   right total knee replacement   2008   TOTAL KNEE ARTHROPLASTY  01/14/2012   Procedure: TOTAL KNEE ARTHROPLASTY;  Surgeon: Johnn Hai, MD;  Location: [facility];  Service: Orthopedics;  Laterality: Left;  preop femoral nerve block   UPPER GI ENDOSCOPY     WISDOM TOOTH EXTRACTION      SOCIAL HISTORY: Social History   Socioeconomic History   Marital status: Married    Spouse name: Not on file   Number of children: Not on file   Years of education: Not on file   Highest education level: Not on file  Occupational History   Not on file  Tobacco Use   Smoking status: Never   Smokeless tobacco: Never  Vaping Use   Vaping Use: Never used  Substance and Sexual Activity   Alcohol use: Yes  Comment: occasional wine   Drug use: No   Sexual activity: Not on file    Comment: Hysterectormy  Other Topics Concern   Not on file  Social History Narrative   Lives in [place] with spouse.   Retired Nurse, learning disability   Social Determinants of Radio broadcast assistant Strain: Not on [employer] Insecurity: Not on file  Transportation Needs: Not on file  Physical Activity: Not on file  Stress: Not on file  Social Connections: Not on file  Intimate Partner Violence: Not on file    FAMILY HISTORY: No family history on file.  ALLERGIES:  is allergic to coumadin [warfarin].  MEDICATIONS:  Current Outpatient Medications  Medication Sig Dispense Refill   acetaminophen (TYLENOL) 500 MG tablet Take 500-1,000 mg by mouth every 6 (six) hours as needed for moderate pain.     ALPRAZolam (XANAX) 0.25 MG tablet Take 0.25 mg by mouth at bedtime.     Calcium  Carb-Cholecalciferol (CALCIUM 600 + D PO) Take 600 mg by mouth daily.     Cholecalciferol (VITAMIN D3) 25 MCG (1000 UT) CAPS Take 1,000 Units by mouth daily.     diltiazem (CARDIZEM) 30 MG tablet Take 1 tablet every 4 hours AS NEEDED for heart rate >100 as long as blood pressure >100. (Patient taking differently: Take 30 mg by mouth See admin instructions. Take 30 mg tablet every 4 hours AS NEEDED for heart rate >100 as long as blood pressure >100.) 30 tablet 1   DULoxetine (CYMBALTA) 30 MG capsule Take 30 mg by mouth See admin instructions. Take with 60 mg to equal 90 mg at night     DULoxetine (CYMBALTA) 60 MG capsule Take 60 mg by mouth See admin instructions. Take with 30 mg to equal 90 mg at night     gabapentin (NEURONTIN) 300 MG capsule Take 1 capsule by mouth 3 (three) times daily.     hydrochlorothiazide (HYDRODIURIL) 25 MG tablet Take 1 tablet (25 mg total) by mouth daily. 30 tablet 2   levothyroxine (SYNTHROID) 200 MCG tablet Take 200 mcg by mouth daily before breakfast.     methocarbamol (ROBAXIN) 500 MG tablet Take 1 tablet (500 mg total) by mouth every 6 (six) hours as needed for muscle spasms. 40 tablet 3   metoprolol succinate (TOPROL-XL) 100 MG 24 hr tablet Take 1 tablet (100 mg total) by mouth daily. Take with or immediately following a meal. 90 tablet 3   omeprazole (PRILOSEC OTC) 20 MG tablet Take 20 mg by mouth daily.     oxyCODONE-acetaminophen (PERCOCET/ROXICET) 5-325 MG tablet Take 1-2 tablets by mouth every 4 (four) hours as needed for moderate pain or severe pain. 60 tablet 0   rivaroxaban (XARELTO) 20 MG TABS tablet Take 1 tablet (20 mg total) by mouth daily with supper. 14 tablet 0   tamoxifen (NOLVADEX) 20 MG tablet TAKE 1 TABLET BY MOUTH DAILY (Patient taking differently: Take 20 mg by mouth daily.) 90 tablet 12   No current facility-administered medications for this visit.    PHYSICAL EXAMINATION: ECOG PERFORMANCE STATUS: 0 - Asymptomatic  Vitals:   11/11/21 1008   BP: 139/60  Pulse: 78  Resp: 18  Temp: (!) 97.1 F (36.2 C)  SpO2: 99%   Filed Weights   11/11/21 1008  Weight: 221 lb 14.4 oz (100.7 kg)    GENERAL:alert, no distress and comfortable SKIN: skin color, texture, turgor are normal, no rashes or significant lesions EYES: normal, conjunctiva are pink  and non-injected, sclera clear OROPHARYNX:no exudate, no erythema and lips, buccal mucosa, and tongue normal  NECK: supple, thyroid normal size, non-tender, without nodularity LYMPH:  no palpable lymphadenopathy in the cervical, axillary or inguinal LUNGS: clear to auscultation and percussion with normal breathing effort HEART: regular rate & rhythm and no murmurs and no lower extremity edema ABDOMEN:abdomen soft, non-tender and normal bowel sounds Musculoskeletal:no cyanosis of digits and no clubbing  PSYCH: alert & oriented x 3 with fluent speech NEURO: no focal motor/sensory deficits Breast: Bilateral breasts inspected, appear normal to inspection.  No palpable masses in the right breast or regional adenopathy.  Upon palpation of left breast, no masses felt in the sitting position whereas in the recumbent position, there is some abnormal density in the left breast upper outer quadrant which is likely fatty tissue.  No nipple changes.  No regional adenopathy in the left side.  LABORATORY DATA:  I have reviewed the data as listed Lab Results  Component Value Date   WBC 4.5 10/01/2021   HGB 8.8 (L) 10/01/2021   HCT 31.8 (L) 10/01/2021   MCV 80.7 10/01/2021   PLT 226 10/01/2021     Chemistry      Component Value Date/Time   NA 136 10/08/2021 0932   NA 142 02/09/2017 1502   K 3.9 10/08/2021 0932   K 3.9 02/09/2017 1502   CL 96 (L) 10/08/2021 0932   CO2 28 10/08/2021 0932   CO2 26 02/09/2017 1502   BUN 13 10/08/2021 0932   BUN 12.3 02/09/2017 1502   CREATININE 0.94 10/08/2021 0932   CREATININE 0.9 02/09/2017 1502      Component Value Date/Time   CALCIUM 9.6 10/08/2021 0932    CALCIUM 9.6 02/09/2017 1502   ALKPHOS 85 02/09/2017 1502   AST 15 02/09/2017 1502   ALT 17 02/09/2017 1502   BILITOT <0.22 02/09/2017 1502       RADIOGRAPHIC STUDIES: I have personally reviewed the radiological images as listed and agreed with the findings in the report. No results found.  All questions were answered. The patient knows to call the clinic with any problems, questions or concerns. I spent 45 minutes in the care of this patient including H and P, review of records, counseling and coordination of care.     Benay Pike, MD 11/11/2021 10:52 AM

## 2021-11-11 ENCOUNTER — Inpatient Hospital Stay: Payer: Medicare Other

## 2021-11-11 ENCOUNTER — Inpatient Hospital Stay: Payer: Medicare Other | Attending: Hematology and Oncology | Admitting: Hematology and Oncology

## 2021-11-11 ENCOUNTER — Other Ambulatory Visit: Payer: Self-pay

## 2021-11-11 ENCOUNTER — Encounter: Payer: Self-pay | Admitting: Hematology and Oncology

## 2021-11-11 VITALS — BP 139/60 | HR 78 | Temp 97.1°F | Resp 18 | Wt 221.9 lb

## 2021-11-11 DIAGNOSIS — N6091 Unspecified benign mammary dysplasia of right breast: Secondary | ICD-10-CM | POA: Insufficient documentation

## 2021-11-11 DIAGNOSIS — M62838 Other muscle spasm: Secondary | ICD-10-CM | POA: Insufficient documentation

## 2021-11-11 DIAGNOSIS — Z79899 Other long term (current) drug therapy: Secondary | ICD-10-CM | POA: Insufficient documentation

## 2021-11-11 DIAGNOSIS — Z7901 Long term (current) use of anticoagulants: Secondary | ICD-10-CM | POA: Diagnosis not present

## 2021-11-11 DIAGNOSIS — M549 Dorsalgia, unspecified: Secondary | ICD-10-CM | POA: Diagnosis not present

## 2021-11-11 MED ORDER — TAMOXIFEN CITRATE 20 MG PO TABS: 20.0000 mg | ORAL_TABLET | Freq: Every day | ORAL | 0 refills | Status: DC

## 2021-11-11 NOTE — Progress Notes (Signed)
Orders placed by Dr.iruku for MM, pt made aware via phone call and MM scheduled for 12/10/21 at 220pm. Pt agreeable to time, no further questions.

## 2021-11-11 NOTE — Addendum Note (Signed)
Addended by: Adaline Sill on: 11/11/2021 12:10 PM   Modules accepted: Orders

## 2021-11-21 ENCOUNTER — Other Ambulatory Visit (HOSPITAL_COMMUNITY): Payer: Self-pay

## 2021-11-21 MED ORDER — RIVAROXABAN 20 MG PO TABS: 20.0000 mg | ORAL_TABLET | Freq: Every day | ORAL | 0 refills | Status: DC

## 2021-11-28 ENCOUNTER — Ambulatory Visit: Payer: Self-pay | Admitting: General Surgery

## 2021-11-28 ENCOUNTER — Telehealth: Payer: Self-pay | Admitting: *Deleted

## 2021-11-28 DIAGNOSIS — I10 Essential (primary) hypertension: Secondary | ICD-10-CM | POA: Diagnosis not present

## 2021-11-28 DIAGNOSIS — K220 Achalasia of cardia: Secondary | ICD-10-CM | POA: Diagnosis not present

## 2021-11-28 DIAGNOSIS — I482 Chronic atrial fibrillation, unspecified: Secondary | ICD-10-CM | POA: Diagnosis not present

## 2021-11-28 NOTE — Telephone Encounter (Signed)
° °  Pre-operative Risk Assessment    Patient Name: Kathryn [name]  DOB: Jun 18, 1956 MRN: [id number]      Request for Surgical Clearance    Procedure:   HIATAL HERNIA REPAIR  Date of Surgery:  Clearance TBD                                 Surgeon:  DR. Ralene Ok Surgeon's Group or Practice Name:  [facility] Phone number:  [phone] Fax number:  [phone] ATTN: Lindwood Coke, RN   Type of Clearance Requested:   - Medical  - Pharmacy:  Hold Rivaroxaban (Xarelto)     Type of Anesthesia:  General    Additional requests/questions:    Jiles Prows   11/28/2021, 2:28 PM

## 2021-11-28 NOTE — H&P (Signed)
Chief Complaint: Hernia       History of Present Illness: Kathryn [name] is a 66 y.o. female who is seen today as an office consultation at the request of Dr. Therisa Doyne for evaluation of Hernia .   Patient is a 66 year old female who comes in secondary to history of dysphagia.  Patient states that over the last 2 years she has had some dysphagia with liquids and solids.  She feels that the liquids and solids lodged in the upper portion of her esophagus.  She states that she has some reflux as well as regurgitation of undigested food.  Patient underwent significant work-up which I reviewed personally.  Patient underwent esophagram.  This did show a moderately dilated esophagus.  Patient also went pH probe study as well as manometry testing.  pH probe study does reveal some reflux with a DeMeester score 41.  Patient also had a manometry test with hypertensive lower esophageal sphincter.  There appeared to be some aperistalsis of the esophagus.   I did discuss these findings with the patient   Patient also sees Dr. Rayann Heman for A. fib, patient is currently on Xarelto.   Patient's had no previous abdominal surgery.       Review of Systems: A complete review of systems was obtained from the patient.  I have reviewed this information and discussed as appropriate with the patient.  See HPI as well for other ROS.   Review of Systems  Constitutional: Negative for fever.  HENT: Negative for congestion.   Eyes: Negative for blurred vision.  Respiratory: Negative for cough, shortness of breath and wheezing.   Cardiovascular: Negative for chest pain and palpitations.  Gastrointestinal: Negative for heartburn.  Genitourinary: Negative for dysuria.  Musculoskeletal: Negative for myalgias.  Skin: Negative for rash.  Neurological: Negative for dizziness and headaches.  Psychiatric/Behavioral: Negative for depression and suicidal ideas.  All other systems reviewed and are negative.       Medical  History: Past Medical History Past Medical History: Diagnosis Date  Anemia    Anxiety    Arrhythmia    Arthritis    GERD (gastroesophageal reflux disease)    Thyroid disease        There is no problem list on file for this patient.     Past Surgical History History reviewed. No pertinent surgical history.     Allergies No Known Allergies    Current Outpatient Medications on File Prior to Visit Medication Sig Dispense Refill  acetaminophen (TYLENOL) 500 MG tablet Take by mouth      ALPRAZolam (XANAX) 0.25 MG tablet        cholecalciferol (VITAMIN D3) 1000 unit capsule Take by mouth      diltiazem (CARDIZEM) 30 MG tablet        DULoxetine (CYMBALTA) 30 MG DR capsule        gabapentin (NEURONTIN) 300 MG capsule gabapentin 300 mg capsule      hydroCHLOROthiazide (HYDRODIURIL) 25 MG tablet        levothyroxine (SYNTHROID) 200 MCG tablet        methocarbamoL (ROBAXIN) 500 MG tablet        metoprolol succinate (TOPROL-XL) 100 MG XL tablet        omeprazole (PRILOSEC OTC) 20 MG EC tablet Take 20 mg by mouth once daily      oxyCODONE-acetaminophen (PERCOCET) 5-325 mg tablet        tamoxifen (NOLVADEX) 20 MG tablet tamoxifen 20 mg tablet      XARELTO  20 mg tablet         No current facility-administered medications on file prior to visit.     Family History Family History Problem Relation Age of Onset  High blood pressure (Hypertension) Brother        Social History   Tobacco Use Smoking Status Never Smokeless Tobacco Never     Social History Social History    Socioeconomic History  Marital status: Married Tobacco Use  Smoking status: Never  Smokeless tobacco: Never Vaping Use  Vaping Use: Never used Substance and Sexual Activity  Alcohol use: Never  Drug use: Never      Objective:     Vitals:   11/28/21 0837 BP: (!) 144/86 Pulse: 101 Temp: 36.2 C (97.1 F) SpO2: 100% Weight: 100.4 kg (221 lb 6.4 oz) Height: 167.6 cm (5\' 6" )   Body mass  index is 35.73 kg/m.   Physical Exam Constitutional:      Appearance: Normal appearance.  HENT:     Head: Normocephalic and atraumatic.     Mouth/Throat:     Mouth: Mucous membranes are moist.     Pharynx: Oropharynx is clear.  Eyes:     General: No scleral icterus.    Pupils: Pupils are equal, round, and reactive to light.  Cardiovascular:     Rate and Rhythm: Normal rate and regular rhythm.     Pulses: Normal pulses.     Heart sounds: No murmur heard.   No friction rub. No gallop.  Pulmonary:     Effort: Pulmonary effort is normal. No respiratory distress.     Breath sounds: Normal breath sounds. No stridor.  Abdominal:     General: Abdomen is flat.  Musculoskeletal:        General: No swelling.  Skin:    General: Skin is warm.  Neurological:     General: No focal deficit present.     Mental Status: She is alert and oriented to person, place, and time. Mental status is at baseline.  Psychiatric:        Mood and Affect: Mood normal.        Thought Content: Thought content normal.        Judgment: Judgment normal.            Assessment and Plan: Diagnoses and all orders for this visit:   Achalasia, esophageal   Chronic a-fib (CMS-HCC)   Primary hypertension     Kathryn [name] is a 66 y.o. female  I had a long discussion with the patient in regards to her case of achalasia.   We discussed the anatomy as well as the treatment for the achalasia.   Patient will require cardiac clearance for evaluation of her A. fib as well as to be off the Xarelto.   1.  Once we have received clearance we will plan on robotic Heller myotomy and Dor fundoplication. 2.  Discussed with her the risk benefits of the procedure to include but not limited to: Infection, bleeding, damage surrounding structures, possible pneumothorax, possible recurrence and reflux in the future.  Patient voiced understanding wishes to proceed.   MDM: high

## 2021-11-28 NOTE — Telephone Encounter (Addendum)
Patient with diagnosis of atrial fibrillation on Xarelto 20 mg daily for anticoagulation.    Procedure:hiatal hernia repair Date of procedure: TBD  CHA2DS2-VASc Score = 3 This indicates a 3.2% annual risk of stroke. The patient's score is based upon: CHF History: 0 HTN History: 1 Diabetes History: 0 Stroke History: 0 Vascular Disease History: 0 Age Score: 1 Gender Score: 1  CrCl 92 mL/min Platelet count 226k  Per office protocol, patient can hold Xarelto for 2 days prior to procedure.

## 2021-12-01 ENCOUNTER — Other Ambulatory Visit: Payer: Self-pay

## 2021-12-01 ENCOUNTER — Ambulatory Visit
Admission: RE | Admit: 2021-12-01 | Discharge: 2021-12-01 | Disposition: A | Discharge status: Home / Self Care | Payer: Medicare Other | Source: Ambulatory Visit | Attending: Neurological Surgery | Admitting: Neurological Surgery

## 2021-12-01 ENCOUNTER — Other Ambulatory Visit: Payer: Medicare Other

## 2021-12-01 DIAGNOSIS — M47816 Spondylosis without myelopathy or radiculopathy, lumbar region: Secondary | ICD-10-CM | POA: Diagnosis not present

## 2021-12-01 DIAGNOSIS — M5126 Other intervertebral disc displacement, lumbar region: Secondary | ICD-10-CM | POA: Diagnosis not present

## 2021-12-01 DIAGNOSIS — M4316 Spondylolisthesis, lumbar region: Secondary | ICD-10-CM

## 2021-12-01 NOTE — Telephone Encounter (Signed)
° °  Primary Cardiologist: Thompson Grayer, MD  Chart reviewed as part of pre-operative protocol coverage. Given past medical history and time since last visit, based on ACC/AHA guidelines, Kathryn [name] would be at acceptable risk for the planned procedure without further cardiovascular testing.   Her RCRI is a class III risk, 6.6% risk of major cardiac event.  Patient with diagnosis of atrial fibrillation on Xarelto 20 mg daily for anticoagulation.     Procedure:hiatal hernia repair Date of procedure: TBD   CHA2DS2-VASc Score = 3 This indicates a 3.2% annual risk of stroke. The patient's score is based upon: CHF History: 0 HTN History: 1 Diabetes History: 0 Stroke History: 0 Vascular Disease History: 0 Age Score: 1 Gender Score: 1   CrCl 92 mL/min Platelet count 226k   Per office protocol, patient can hold Xarelto for 2 days prior to procedure.  I will route this recommendation to the requesting party via Epic fax function and remove from pre-op pool.  Please call with questions.  Jossie Ng. Eulogio Requena NP-C    12/01/2021, 7:55 AM [place] [address] Office [phone] Fax [phone]

## 2021-12-03 DIAGNOSIS — M4316 Spondylolisthesis, lumbar region: Secondary | ICD-10-CM | POA: Diagnosis not present

## 2021-12-04 ENCOUNTER — Other Ambulatory Visit: Payer: Self-pay | Admitting: Internal Medicine

## 2021-12-04 NOTE — Telephone Encounter (Signed)
Prescription refill request for Xarelto received.  Indication: Afib  Last office visit: 10/01/21 Kayleen Memos) Weight: 100.7kg Age: 66 Scr: 0.94 (10/08/21)  CrCl: 92.42ml/min  Appropriate dose and refill sent to requested pharmacy.

## 2021-12-10 ENCOUNTER — Other Ambulatory Visit: Payer: Self-pay | Admitting: *Deleted

## 2021-12-10 ENCOUNTER — Ambulatory Visit
Admission: RE | Admit: 2021-12-10 | Discharge: 2021-12-10 | Disposition: A | Discharge status: Home / Self Care | Payer: Medicare Other | Source: Ambulatory Visit | Attending: Hematology and Oncology | Admitting: Hematology and Oncology

## 2021-12-10 DIAGNOSIS — N6091 Unspecified benign mammary dysplasia of right breast: Secondary | ICD-10-CM

## 2021-12-10 HISTORY — DX: Malignant neoplasm of unspecified site of unspecified female breast: C50.919

## 2021-12-16 DIAGNOSIS — G894 Chronic pain syndrome: Secondary | ICD-10-CM | POA: Diagnosis not present

## 2021-12-16 DIAGNOSIS — Z9889 Other specified postprocedural states: Secondary | ICD-10-CM | POA: Diagnosis not present

## 2021-12-20 ENCOUNTER — Other Ambulatory Visit: Payer: Self-pay | Admitting: Hematology and Oncology

## 2021-12-20 DIAGNOSIS — N6320 Unspecified lump in the left breast, unspecified quadrant: Secondary | ICD-10-CM

## 2021-12-25 ENCOUNTER — Ambulatory Visit: Payer: Self-pay | Admitting: General Surgery

## 2021-12-29 ENCOUNTER — Other Ambulatory Visit (HOSPITAL_COMMUNITY): Payer: Self-pay | Admitting: Nurse Practitioner

## 2022-01-05 NOTE — Progress Notes (Signed)
Surgical Instructions    Your procedure is scheduled on 01/09/22.  Report to [facility] Main Entrance "A" at 5:30 A.M., then check in with the Admitting office.  Call this number if you have problems the morning of surgery:  [phone]   If you have any questions prior to your surgery date call [phone]: Open Monday-Friday 8am-4pm    Remember:  Do not eat or drink after midnight the night before your surgery      Take these medicines the morning of surgery with A SIP OF WATER:  gabapentin (NEURONTIN) levothyroxine (SYNTHROID) metoprolol succinate (TOPROL-XL) omeprazole (PRILOSEC OTC)  tamoxifen (NOLVADEX)   IF NEEDED: diltiazem (CARDIZEM) methocarbamol (ROBAXIN) oxyCODONE-acetaminophen (PERCOCET/ROXICET)  As of today, STOP taking any Aspirin (unless otherwise instructed by your surgeon) Aleve, Naproxen, Ibuprofen, Motrin, Advil, Goody's, BC's, all herbal medications, fish oil, and all vitamins.  Please hold rivaroxaban (XARELTO) 2 days prior to surgery. Your last dose will 01/06/22.          Do not wear jewelry or makeup Do not wear lotions, powders, perfumes/colognes, or deodorant. Do not shave 48 hours prior to surgery.  Do not bring valuables to the hospital. Do not wear nail polish, gel polish, artificial nails, or any other type of covering on natural nails (fingers and toes) If you have artificial nails or gel coating that need to be removed by a nail salon, please have this removed prior to surgery. Artificial nails or gel coating may interfere with anesthesia's ability to adequately monitor your vital signs.  [facility] is not responsible for any belongings or valuables. .   Do NOT Smoke (Tobacco/Vaping)  24 hours prior to your procedure  If you use a CPAP at night, you may bring your mask for your overnight stay.   Contacts, glasses, hearing aids, dentures or partials may not be worn into surgery, please bring cases for these belongings   For patients  admitted to the hospital, discharge time will be determined by your treatment team.   Patients discharged the day of surgery will not be allowed to drive home, and someone needs to stay with them for 24 hours.  NO VISITORS WILL BE ALLOWED IN PRE-OP WHERE PATIENTS ARE PREPPED FOR SURGERY.  ONLY 1 SUPPORT PERSON MAY BE PRESENT IN THE WAITING ROOM WHILE YOU ARE IN SURGERY.  IF YOU ARE TO BE ADMITTED, ONCE YOU ARE IN YOUR ROOM YOU WILL BE ALLOWED TWO (2) VISITORS. 1 (ONE) VISITOR MAY STAY OVERNIGHT BUT MUST ARRIVE TO THE ROOM BY 8pm.  Minor children may have two parents present. Special consideration for safety and communication needs will be reviewed on a case by case basis.  Special instructions:    Oral Hygiene is also important to reduce your risk of infection.  Remember - BRUSH YOUR TEETH THE MORNING OF SURGERY WITH YOUR REGULAR TOOTHPASTE   [facility]- Preparing For Surgery  Before surgery, you can play an important role. Because skin is not sterile, your skin needs to be as free of germs as possible. You can reduce the number of germs on your skin by washing with CHG (chlorahexidine gluconate) Soap before surgery.  CHG is an antiseptic cleaner which kills germs and bonds with the skin to continue killing germs even after washing.     Please do not use if you have an allergy to CHG or antibacterial soaps. If your skin becomes reddened/irritated stop using the CHG.  Do not shave (including legs and underarms) for at least 48 hours  prior to first CHG shower. It is OK to shave your face.  Please follow these instructions carefully.     Shower the NIGHT BEFORE SURGERY and the MORNING OF SURGERY with CHG Soap.   If you chose to wash your hair, wash your hair first as usual with your normal shampoo. After you shampoo, rinse your hair and body thoroughly to remove the shampoo.  Then [employer] and genitals (private parts) with your normal soap and rinse thoroughly to remove soap.  After that Use  CHG Soap as you would any other liquid soap. You can apply CHG directly to the skin and wash gently with a scrungie or a clean washcloth.   Apply the CHG Soap to your body ONLY FROM THE NECK DOWN.  Do not use on open wounds or open sores. Avoid contact with your eyes, ears, mouth and genitals (private parts). Wash Face and genitals (private parts)  with your normal soap.   Wash thoroughly, paying special attention to the area where your surgery will be performed.  Thoroughly rinse your body with warm water from the neck down.  DO NOT shower/wash with your normal soap after using and rinsing off the CHG Soap.  Pat yourself dry with a CLEAN TOWEL.  Wear CLEAN PAJAMAS to bed the night before surgery  Place CLEAN SHEETS on your bed the night before your surgery  DO NOT SLEEP WITH PETS.   Day of Surgery: Take a shower with CHG soap. Wear Clean/Comfortable clothing the morning of surgery Do not apply any deodorants/lotions.   Remember to brush your teeth WITH YOUR REGULAR TOOTHPASTE.    COVID testing  If you are going to stay overnight or be admitted after your procedure/surgery and require a pre-op COVID test, please follow these instructions after your COVID test   You are not required to quarantine however you are required to wear a well-fitting mask when you are out and around people not in your household.  If your mask becomes wet or soiled, replace with a new one.  Wash your hands often with soap and water for 20 seconds or clean your hands with an alcohol-based hand sanitizer that contains at least 60% alcohol.  Do not share personal items.  Notify your provider: if you are in close contact with someone who has COVID  or if you develop a fever of 100.4 or greater, sneezing, cough, sore throat, shortness of breath or body aches.    Please read over the following fact sheets that you were given.

## 2022-01-06 ENCOUNTER — Encounter (HOSPITAL_COMMUNITY)
Admission: RE | Admit: 2022-01-06 | Discharge: 2022-01-06 | Disposition: A | Discharge status: Home / Self Care | Payer: Medicare Other | Source: Ambulatory Visit | Attending: General Surgery | Admitting: General Surgery

## 2022-01-06 ENCOUNTER — Other Ambulatory Visit: Payer: Self-pay

## 2022-01-06 ENCOUNTER — Encounter (HOSPITAL_COMMUNITY): Payer: Self-pay

## 2022-01-06 VITALS — BP 136/89 | HR 86 | Temp 98.3°F | Resp 17 | Ht 66.0 in | Wt 218.4 lb

## 2022-01-06 DIAGNOSIS — Z79899 Other long term (current) drug therapy: Secondary | ICD-10-CM | POA: Diagnosis not present

## 2022-01-06 DIAGNOSIS — R7989 Other specified abnormal findings of blood chemistry: Secondary | ICD-10-CM | POA: Insufficient documentation

## 2022-01-06 DIAGNOSIS — E785 Hyperlipidemia, unspecified: Secondary | ICD-10-CM | POA: Diagnosis not present

## 2022-01-06 DIAGNOSIS — I480 Paroxysmal atrial fibrillation: Secondary | ICD-10-CM | POA: Insufficient documentation

## 2022-01-06 DIAGNOSIS — D6489 Other specified anemias: Secondary | ICD-10-CM | POA: Insufficient documentation

## 2022-01-06 DIAGNOSIS — Z8249 Family history of ischemic heart disease and other diseases of the circulatory system: Secondary | ICD-10-CM | POA: Diagnosis not present

## 2022-01-06 DIAGNOSIS — I4891 Unspecified atrial fibrillation: Secondary | ICD-10-CM | POA: Diagnosis not present

## 2022-01-06 DIAGNOSIS — F419 Anxiety disorder, unspecified: Secondary | ICD-10-CM | POA: Diagnosis not present

## 2022-01-06 DIAGNOSIS — Z79890 Hormone replacement therapy (postmenopausal): Secondary | ICD-10-CM | POA: Diagnosis not present

## 2022-01-06 DIAGNOSIS — Z01812 Encounter for preprocedural laboratory examination: Secondary | ICD-10-CM | POA: Insufficient documentation

## 2022-01-06 DIAGNOSIS — Z7901 Long term (current) use of anticoagulants: Secondary | ICD-10-CM | POA: Insufficient documentation

## 2022-01-06 DIAGNOSIS — Z01818 Encounter for other preprocedural examination: Secondary | ICD-10-CM

## 2022-01-06 DIAGNOSIS — K449 Diaphragmatic hernia without obstruction or gangrene: Secondary | ICD-10-CM | POA: Diagnosis not present

## 2022-01-06 DIAGNOSIS — I10 Essential (primary) hypertension: Secondary | ICD-10-CM | POA: Insufficient documentation

## 2022-01-06 DIAGNOSIS — I482 Chronic atrial fibrillation, unspecified: Secondary | ICD-10-CM | POA: Diagnosis not present

## 2022-01-06 DIAGNOSIS — Z20822 Contact with and (suspected) exposure to covid-19: Secondary | ICD-10-CM | POA: Insufficient documentation

## 2022-01-06 DIAGNOSIS — K220 Achalasia of cardia: Secondary | ICD-10-CM | POA: Diagnosis not present

## 2022-01-06 DIAGNOSIS — K219 Gastro-esophageal reflux disease without esophagitis: Secondary | ICD-10-CM | POA: Diagnosis not present

## 2022-01-06 DIAGNOSIS — Z888 Allergy status to other drugs, medicaments and biological substances status: Secondary | ICD-10-CM | POA: Diagnosis not present

## 2022-01-06 DIAGNOSIS — E039 Hypothyroidism, unspecified: Secondary | ICD-10-CM | POA: Diagnosis not present

## 2022-01-06 DIAGNOSIS — R922 Inconclusive mammogram: Secondary | ICD-10-CM | POA: Diagnosis not present

## 2022-01-06 HISTORY — DX: Cardiac arrhythmia, unspecified: I49.9

## 2022-01-06 LAB — BASIC METABOLIC PANEL
Anion gap: 11 (ref 5–15)
BUN: 15 mg/dL (ref 8–23)
CO2: 28 mmol/L (ref 22–32)
Calcium: 9.1 mg/dL (ref 8.9–10.3)
Chloride: 100 mmol/L (ref 98–111)
Creatinine, Ser: 1.2 mg/dL — ABNORMAL HIGH (ref 0.44–1.00)
GFR, Estimated: 50 mL/min — ABNORMAL LOW (ref >60)
Glucose, Bld: 122 mg/dL — ABNORMAL HIGH (ref 70–99)
Potassium: 3.5 mmol/L (ref 3.5–5.1)
Sodium: 139 mmol/L (ref 135–145)

## 2022-01-06 LAB — CBC
HCT: 33.4 % — ABNORMAL LOW (ref 36.0–46.0)
Hemoglobin: 9.7 g/dL — ABNORMAL LOW (ref 12.0–15.0)
MCH: 23.4 pg — ABNORMAL LOW (ref 26.0–34.0)
MCHC: 29 g/dL — ABNORMAL LOW (ref 30.0–36.0)
MCV: 80.5 fL (ref 80.0–100.0)
Platelets: 291 10*3/uL (ref 150–400)
RBC: 4.15 MIL/uL (ref 3.87–5.11)
RDW: 18.8 % — ABNORMAL HIGH (ref 11.5–15.5)
WBC: 5.4 10*3/uL (ref 4.0–10.5)
nRBC: 0 % (ref 0.0–0.2)

## 2022-01-06 LAB — SARS CORONAVIRUS 2 (TAT 6-24 HRS): SARS Coronavirus 2: NEGATIVE

## 2022-01-06 NOTE — Progress Notes (Signed)
Surgical Instructions    Your procedure is scheduled on Friday, February 17th.  Report to [facility] Main Entrance "A" at 5:30 A.M., then check in with the Admitting office.  Call this number if you have problems the morning of surgery:  [phone]   If you have any questions prior to your surgery date call [phone]: Open Monday-Friday 8am-4pm    Remember:  Do not eat after midnight the night before your surgery  You may drink clear liquids until 4:30 AM the morning of your surgery.   Clear liquids allowed are: Water, Non-Citrus Juices (without pulp), Carbonated Beverages, Clear Tea, Black Coffee ONLY (NO MILK, CREAM OR POWDERED CREAMER of any kind), and Gatorade  Please complete your PRE-SURGERY ENSURE that was provided to you by 4:30 AM the morning of surgery.  Please, if able, drink it in one sitting. DO NOT SIP. Nothing else to drink once finished.    Take these medicines the morning of surgery with A SIP OF WATER:  gabapentin (NEURONTIN) Levothyroxine (SYNTHROID) metoprolol succinate (TOPROL-XL) omeprazole (PRILOSEC OTC)  tamoxifen (NOLVADEX)   IF NEEDED: diltiazem (CARDIZEM) methocarbamol (ROBAXIN) oxyCODONE-acetaminophen (PERCOCET/ROXICET)  As of today, STOP taking any Aspirin (unless otherwise instructed by your surgeon) Aleve, Naproxen, Ibuprofen, Motrin, Advil, Goody's, BC's, all herbal medications, fish oil, and all vitamins.  Please hold rivaroxaban (XARELTO) 2 days prior to surgery. Your last dose will 01/06/22.          Do not wear jewelry or makeup Do not wear lotions, powders, perfumes, or deodorant. Do not shave 48 hours prior to surgery.  Do not bring valuables to the hospital. Do not wear nail polish, gel polish, artificial nails, or any other type of covering on natural nails (fingers and toes) If you have artificial nails or gel coating that need to be removed by a nail salon, please have this removed prior to surgery. Artificial nails or gel coating  may interfere with anesthesia's ability to adequately monitor your vital signs.  [facility] is not responsible for any belongings or valuables. .   Do NOT Smoke (Tobacco/Vaping)  24 hours prior to your procedure  If you use a CPAP at night, you may bring your mask for your overnight stay.   Contacts, glasses, hearing aids, dentures or partials may not be worn into surgery, please bring cases for these belongings   For patients admitted to the hospital, discharge time will be determined by your treatment team.   Patients discharged the day of surgery will not be allowed to drive home, and someone needs to stay with them for 24 hours.  NO VISITORS WILL BE ALLOWED IN PRE-OP WHERE PATIENTS ARE PREPPED FOR SURGERY.  ONLY 1 SUPPORT PERSON MAY BE PRESENT IN THE WAITING ROOM WHILE YOU ARE IN SURGERY.  IF YOU ARE TO BE ADMITTED, ONCE YOU ARE IN YOUR ROOM YOU WILL BE ALLOWED TWO (2) VISITORS. 1 (ONE) VISITOR MAY STAY OVERNIGHT BUT MUST ARRIVE TO THE ROOM BY 8pm.  Minor children may have two parents present. Special consideration for safety and communication needs will be reviewed on a case by case basis.  Special instructions:    Oral Hygiene is also important to reduce your risk of infection.  Remember - BRUSH YOUR TEETH THE MORNING OF SURGERY WITH YOUR REGULAR TOOTHPASTE   [facility]- Preparing For Surgery  Before surgery, you can play an important role. Because skin is not sterile, your skin needs to be as free of germs as possible. You can reduce  the number of germs on your skin by washing with CHG (chlorahexidine gluconate) Soap before surgery.  CHG is an antiseptic cleaner which kills germs and bonds with the skin to continue killing germs even after washing.     Please do not use if you have an allergy to CHG or antibacterial soaps. If your skin becomes reddened/irritated stop using the CHG.  Do not shave (including legs and underarms) for at least 48 hours prior to first CHG shower. It is  OK to shave your face.  Please follow these instructions carefully.     Shower the NIGHT BEFORE SURGERY and the MORNING OF SURGERY with CHG Soap.   If you chose to wash your hair, wash your hair first as usual with your normal shampoo. After you shampoo, rinse your hair and body thoroughly to remove the shampoo.  Then [employer] and genitals (private parts) with your normal soap and rinse thoroughly to remove soap.  After that Use CHG Soap as you would any other liquid soap. You can apply CHG directly to the skin and wash gently with a scrungie or a clean washcloth.   Apply the CHG Soap to your body ONLY FROM THE NECK DOWN.  Do not use on open wounds or open sores. Avoid contact with your eyes, ears, mouth and genitals (private parts). Wash Face and genitals (private parts)  with your normal soap.   Wash thoroughly, paying special attention to the area where your surgery will be performed.  Thoroughly rinse your body with warm water from the neck down.  DO NOT shower/wash with your normal soap after using and rinsing off the CHG Soap.  Pat yourself dry with a CLEAN TOWEL.  Wear CLEAN PAJAMAS to bed the night before surgery  Place CLEAN SHEETS on your bed the night before your surgery  DO NOT SLEEP WITH PETS.   Day of Surgery: Take a shower with CHG soap. Wear Clean/Comfortable clothing the morning of surgery Do not apply any deodorants/lotions.   Remember to brush your teeth WITH YOUR REGULAR TOOTHPASTE.    COVID testing  If you are going to stay overnight or be admitted after your procedure/surgery and require a pre-op COVID test, please follow these instructions after your COVID test   You are not required to quarantine however you are required to wear a well-fitting mask when you are out and around people not in your household.  If your mask becomes wet or soiled, replace with a new one.  Wash your hands often with soap and water for 20 seconds or clean your hands with an  alcohol-based hand sanitizer that contains at least 60% alcohol.  Do not share personal items.  Notify your provider: if you are in close contact with someone who has COVID  or if you develop a fever of 100.4 or greater, sneezing, cough, sore throat, shortness of breath or body aches.    Please read over the following fact sheets that you were given.

## 2022-01-06 NOTE — Progress Notes (Addendum)
PCP - [name] Cardiologist - Dr. Rayann Heman Received cardiac clearance on 12/01/21  Chest x-ray - n/a EKG - 10/01/21 Stress Test - 07/13/08 ECHO - 01/17/21   Blood Thinner Instructions: last dose of Xarelto on 01/05/22 pm, per patient   ERAS Protcol - yes, Ensure given   COVID TEST- 01/06/22 (outpatient in bed)   Anesthesia review: yes, heart history, Hgb 9.7 (Dr. Rosendo Gros notified via [place]) Received cardiac clearance on 12/01/21  Patient denies shortness of breath, fever, cough and chest pain at PAT appointment   All instructions explained to the patient, with a verbal understanding of the material. Patient agrees to go over the instructions while at home for a better understanding. Patient also instructed to self quarantine after being tested for COVID-19. The opportunity to ask questions was provided.

## 2022-01-07 NOTE — Anesthesia Preprocedure Evaluation (Addendum)
Anesthesia Evaluation  Patient identified by MRN, date of birth, ID band Patient awake    Reviewed: Allergy & Precautions, NPO status , Patient's Chart, lab work & pertinent test results, reviewed documented beta blocker date and time   Airway Mallampati: III  TM Distance: >3 FB Neck ROM: Full    Dental  (+) Partial Upper, Dental Advisory Given,    Pulmonary shortness of breath and with exertion,    Pulmonary exam normal breath sounds clear to auscultation   (-) rales    Cardiovascular hypertension, Pt. on medications and Pt. on home beta blockers + dysrhythmias Atrial Fibrillation  Rhythm:Regular Rate:Normal  EKG 10/01/21 NSR, Non specific ST-T wave abnormalities  Echo 01/17/21 1. Left ventricular ejection fraction, by estimation, is 55 to 60%. The left ventricle has normal function. The left ventricle has no regional wall motion abnormalities. There is moderate left ventricular hypertrophy. Left ventricular diastolic parameters are consistent with Grade II diastolic dysfunction  (pseudonormalization).  2. Right ventricular systolic function is normal. The right ventricular size is normal. Tricuspid regurgitation signal is inadequate for assessing PA pressure.  3. Left atrial size was mildly dilated.  4. The mitral valve is normal in structure. Trivial mitral valve regurgitation. No evidence of mitral stenosis.  5. The aortic valve is abnormal. There is moderate calcification of the aortic valve. Aortic valve regurgitation is not visualized. Mild aortic valve sclerosis is present, with no evidence of aortic valve stenosis.  6. The inferior vena cava is normal in size with greater than 50% respiratory variability, suggesting right atrial pressure of 3 mmHg.    Neuro/Psych PSYCHIATRIC DISORDERS Anxiety Depression  Neuromuscular disease    GI/Hepatic hiatal hernia, GERD  Medicated,  Endo/Other  Hypothyroidism  Hyperlipidemia Obesity  Renal/GU   negative genitourinary   Musculoskeletal  (+) Arthritis , Osteoarthritis,  Hx/o cervical fusion Chronic lumbar radiculopathy   Abdominal (+) + obese,   Peds  Hematology Xarelto therapy- last dose 6 days ago   Anesthesia Other Findings   Reproductive/Obstetrics                           Anesthesia Physical Anesthesia Plan  ASA: 3  Anesthesia Plan: General   Post-op Pain Management: Ofirmev IV (intra-op)*, Precedex and Dilaudid IV   Induction: Intravenous, Rapid sequence and Cricoid pressure planned  PONV Risk Score and Plan: 4 or greater and Treatment may vary due to age or medical condition, Ondansetron and Dexamethasone  Airway Management Planned: Oral ETT  Additional Equipment: None  Intra-op Plan:   Post-operative Plan: Extubation in OR  Informed Consent: I have reviewed the patients History and Physical, chart, labs and discussed the procedure including the risks, benefits and alternatives for the proposed anesthesia with the patient or authorized representative who has indicated his/her understanding and acceptance.       Plan Discussed with:   Anesthesia Plan Comments: (PAT note by Karoline Caldwell, PA-C: Follows with cardiology for history of paroxysmal atrial fibrillation, HTN.  Cardiac clearance per telephone encounter 12/01/2021, "Chart reviewed as part of pre-operative protocol coverage. Given past medical history and time since last visit, based on ACC/AHA guidelines,Kathryn W Whitakerwould be at acceptable risk for the planned procedure without further cardiovascular testing. Her RCRI is a class III risk, 6.6% risk of major cardiac event. Patient with diagnosis ofatrial fibrillationon Xarelto 20 mg dailyfor anticoagulation.Marland KitchenMarland KitchenPer office protocol, patient can holdXareltofor 2daysprior to procedure."  Patient reported last dose of Xarelto 01/05/2022. 2 peripheral  IV's one in each arm   Preop labs  reviewed, creatinine elevated 1.20, chronic anemia with hemoglobin 9.7, otherwise unremarkable.  PAT RN sent these results to Dr. Rosendo Gros via epic inbox.  EKG 10/01/2021: NSR.  Rate 64.  Nonspecific ST abnormality.  TTE 01/18/2021: 1. Left ventricular ejection fraction, by estimation, is 55 to 60%. The  left ventricle has normal function. The left ventricle has no regional  wall motion abnormalities. There is moderate left ventricular hypertrophy.  Left ventricular diastolic  parameters are consistent with Grade II diastolic dysfunction  (pseudonormalization).  2. Right ventricular systolic function is normal. The right ventricular  size is normal. Tricuspid regurgitation signal is inadequate for assessing  PA pressure.  3. Left atrial size was mildly dilated.  4. The mitral valve is normal in structure. Trivial mitral valve  regurgitation. No evidence of mitral stenosis.  5. The aortic valve is abnormal. There is moderate calcification of the  aortic valve. Aortic valve regurgitation is not visualized. Mild aortic  valve sclerosis is present, with no evidence of aortic valve stenosis.  6. The inferior vena cava is normal in size with greater than 50%  respiratory variability, suggesting right atrial pressure of 3 mmHg.   )       Anesthesia Quick Evaluation

## 2022-01-07 NOTE — Progress Notes (Signed)
Anesthesia Chart Review:  Follows with cardiology for history of paroxysmal atrial fibrillation, HTN.  Cardiac clearance per telephone encounter 12/01/2021, "Chart reviewed as part of pre-operative protocol coverage. Given past medical history and time since last visit, based on ACC/AHA guidelines, Kathryn [name] would be at acceptable risk for the planned procedure without further cardiovascular testing. Her RCRI is a class III risk, 6.6% risk of major cardiac event. Patient with diagnosis of atrial fibrillation on Xarelto 20 mg daily for anticoagulation.Marland KitchenMarland KitchenPer office protocol, patient can hold Xarelto for 2 days prior to procedure."  Patient reported last dose of Xarelto 01/05/2022.  Preop labs reviewed, creatinine elevated 1.20, chronic anemia with hemoglobin 9.7, otherwise unremarkable.  PAT RN sent these results to Dr. Rosendo Gros via epic inbox.  EKG 10/01/2021: NSR.  Rate 64.  Nonspecific ST abnormality.  TTE 01/18/2021:  1. Left ventricular ejection fraction, by estimation, is 55 to 60%. The  left ventricle has normal function. The left ventricle has no regional  wall motion abnormalities. There is moderate left ventricular hypertrophy.  Left ventricular diastolic  parameters are consistent with Grade II diastolic dysfunction  (pseudonormalization).   2. Right ventricular systolic function is normal. The right ventricular  size is normal. Tricuspid regurgitation signal is inadequate for assessing  PA pressure.   3. Left atrial size was mildly dilated.   4. The mitral valve is normal in structure. Trivial mitral valve  regurgitation. No evidence of mitral stenosis.   5. The aortic valve is abnormal. There is moderate calcification of the  aortic valve. Aortic valve regurgitation is not visualized. Mild aortic  valve sclerosis is present, with no evidence of aortic valve stenosis.   6. The inferior vena cava is normal in size with greater than 50%  respiratory variability, suggesting right  atrial pressure of 3 mmHg.    Wynonia Musty [facility]/Anesthesiology Phone [phone] 01/07/2022 3:45 PM

## 2022-01-08 ENCOUNTER — Ambulatory Visit: Payer: Medicare Other

## 2022-01-08 ENCOUNTER — Ambulatory Visit
Admission: RE | Admit: 2022-01-08 | Discharge: 2022-01-08 | Disposition: A | Discharge status: Home / Self Care | Payer: Medicare Other | Source: Ambulatory Visit | Attending: Hematology and Oncology | Admitting: Hematology and Oncology

## 2022-01-08 DIAGNOSIS — R922 Inconclusive mammogram: Secondary | ICD-10-CM | POA: Diagnosis not present

## 2022-01-08 DIAGNOSIS — N6320 Unspecified lump in the left breast, unspecified quadrant: Secondary | ICD-10-CM

## 2022-01-09 ENCOUNTER — Other Ambulatory Visit: Payer: Self-pay

## 2022-01-09 ENCOUNTER — Inpatient Hospital Stay (HOSPITAL_COMMUNITY)
Admission: RE | Admit: 2022-01-09 | Discharge: 2022-01-10 | DRG: 327 | Disposition: A | Discharge status: Home / Self Care | Payer: Medicare Other | Source: Ambulatory Visit | Attending: General Surgery | Admitting: General Surgery

## 2022-01-09 ENCOUNTER — Encounter (HOSPITAL_COMMUNITY)
Admission: RE | Disposition: A | Discharge status: Home / Self Care | Payer: Self-pay | Source: Ambulatory Visit | Attending: General Surgery

## 2022-01-09 ENCOUNTER — Inpatient Hospital Stay (HOSPITAL_COMMUNITY): Payer: Medicare Other | Admitting: Anesthesiology

## 2022-01-09 ENCOUNTER — Encounter (HOSPITAL_COMMUNITY): Payer: Self-pay | Admitting: General Surgery

## 2022-01-09 ENCOUNTER — Inpatient Hospital Stay (HOSPITAL_COMMUNITY): Payer: Medicare Other | Admitting: Physician Assistant

## 2022-01-09 DIAGNOSIS — I4891 Unspecified atrial fibrillation: Secondary | ICD-10-CM

## 2022-01-09 DIAGNOSIS — Z79899 Other long term (current) drug therapy: Secondary | ICD-10-CM

## 2022-01-09 DIAGNOSIS — I482 Chronic atrial fibrillation, unspecified: Secondary | ICD-10-CM | POA: Diagnosis present

## 2022-01-09 DIAGNOSIS — K220 Achalasia of cardia: Principal | ICD-10-CM | POA: Diagnosis present

## 2022-01-09 DIAGNOSIS — Z7901 Long term (current) use of anticoagulants: Secondary | ICD-10-CM

## 2022-01-09 DIAGNOSIS — K449 Diaphragmatic hernia without obstruction or gangrene: Secondary | ICD-10-CM | POA: Diagnosis not present

## 2022-01-09 DIAGNOSIS — Z79890 Hormone replacement therapy (postmenopausal): Secondary | ICD-10-CM

## 2022-01-09 DIAGNOSIS — Z20822 Contact with and (suspected) exposure to covid-19: Secondary | ICD-10-CM | POA: Diagnosis present

## 2022-01-09 DIAGNOSIS — Z888 Allergy status to other drugs, medicaments and biological substances status: Secondary | ICD-10-CM | POA: Diagnosis not present

## 2022-01-09 DIAGNOSIS — Z8249 Family history of ischemic heart disease and other diseases of the circulatory system: Secondary | ICD-10-CM

## 2022-01-09 DIAGNOSIS — E785 Hyperlipidemia, unspecified: Secondary | ICD-10-CM | POA: Diagnosis present

## 2022-01-09 DIAGNOSIS — I10 Essential (primary) hypertension: Secondary | ICD-10-CM

## 2022-01-09 DIAGNOSIS — E039 Hypothyroidism, unspecified: Secondary | ICD-10-CM | POA: Diagnosis present

## 2022-01-09 DIAGNOSIS — F419 Anxiety disorder, unspecified: Secondary | ICD-10-CM | POA: Diagnosis present

## 2022-01-09 DIAGNOSIS — K219 Gastro-esophageal reflux disease without esophagitis: Secondary | ICD-10-CM | POA: Diagnosis present

## 2022-01-09 HISTORY — PX: ESOPHAGOGASTRODUODENOSCOPY ENDOSCOPY: SHX5814

## 2022-01-09 SURGERY — ESOPHAGOMYOTOMY, ROBOT-ASSISTED, HELLER
Anesthesia: General | Site: Abdomen

## 2022-01-09 MED ORDER — CEFAZOLIN SODIUM-DEXTROSE 2-4 GM/100ML-% IV SOLN
INTRAVENOUS | Status: AC
  Filled 2022-01-09: qty 100

## 2022-01-09 MED ORDER — ONDANSETRON HCL 4 MG/2ML IJ SOLN
INTRAMUSCULAR | Status: DC | PRN
Start: 2022-01-09 — End: 2022-01-09
  Administered 2022-01-09: 4 mg via INTRAVENOUS

## 2022-01-09 MED ORDER — FENTANYL CITRATE (PF) 250 MCG/5ML IJ SOLN
INTRAMUSCULAR | Status: AC
  Filled 2022-01-09: qty 5

## 2022-01-09 MED ORDER — OXYCODONE HCL 5 MG PO TABS: 5.0000 mg | ORAL_TABLET | Freq: Once | ORAL | Status: DC | PRN

## 2022-01-09 MED ORDER — 0.9 % SODIUM CHLORIDE (POUR BTL) OPTIME
TOPICAL | Status: DC | PRN
  Administered 2022-01-09: 1000 mL

## 2022-01-09 MED ORDER — MIDAZOLAM HCL 2 MG/2ML IJ SOLN
INTRAMUSCULAR | Status: AC
  Filled 2022-01-09: qty 2

## 2022-01-09 MED ORDER — DEXMEDETOMIDINE (PRECEDEX) IN NS 20 MCG/5ML (4 MCG/ML) IV SYRINGE
PREFILLED_SYRINGE | INTRAVENOUS | Status: AC
  Filled 2022-01-09: qty 5

## 2022-01-09 MED ORDER — ROCURONIUM BROMIDE 10 MG/ML (PF) SYRINGE
PREFILLED_SYRINGE | INTRAVENOUS | Status: DC | PRN
Start: 2022-01-09 — End: 2022-01-09
  Administered 2022-01-09: 10 mg via INTRAVENOUS
  Administered 2022-01-09: 80 mg via INTRAVENOUS
  Administered 2022-01-09: 20 mg via INTRAVENOUS

## 2022-01-09 MED ORDER — ONDANSETRON 4 MG PO TBDP: 4.0000 mg | ORAL_TABLET | Freq: Four times a day (QID) | ORAL | Status: DC | PRN

## 2022-01-09 MED ORDER — OXYCODONE HCL 5 MG/5ML PO SOLN: 5.0000 mg | Freq: Once | ORAL | Status: DC | PRN

## 2022-01-09 MED ORDER — CHLORHEXIDINE GLUCONATE CLOTH 2 % EX PADS: 6.0000 | MEDICATED_PAD | Freq: Once | CUTANEOUS | Status: DC

## 2022-01-09 MED ORDER — FENTANYL CITRATE (PF) 250 MCG/5ML IJ SOLN
INTRAMUSCULAR | Status: DC | PRN
  Administered 2022-01-09: 150 ug via INTRAVENOUS

## 2022-01-09 MED ORDER — ROCURONIUM BROMIDE 10 MG/ML (PF) SYRINGE
PREFILLED_SYRINGE | INTRAVENOUS | Status: AC
  Filled 2022-01-09: qty 10

## 2022-01-09 MED ORDER — LIDOCAINE 2% (20 MG/ML) 5 ML SYRINGE
INTRAMUSCULAR | Status: AC
  Filled 2022-01-09: qty 5

## 2022-01-09 MED ORDER — BUPIVACAINE-EPINEPHRINE (PF) 0.25% -1:200000 IJ SOLN
INTRAMUSCULAR | Status: AC
  Filled 2022-01-09: qty 30

## 2022-01-09 MED ORDER — DEXAMETHASONE SODIUM PHOSPHATE 10 MG/ML IJ SOLN
INTRAMUSCULAR | Status: AC
  Filled 2022-01-09: qty 1

## 2022-01-09 MED ORDER — BUPIVACAINE-EPINEPHRINE 0.25% -1:200000 IJ SOLN
INTRAMUSCULAR | Status: DC | PRN
  Administered 2022-01-09: 17 mL

## 2022-01-09 MED ORDER — SUGAMMADEX SODIUM 200 MG/2ML IV SOLN
INTRAVENOUS | Status: DC | PRN
Start: 2022-01-09 — End: 2022-01-09
  Administered 2022-01-09: 400 mg via INTRAVENOUS

## 2022-01-09 MED ORDER — ONDANSETRON HCL 4 MG/2ML IJ SOLN: 4.0000 mg | Freq: Once | INTRAMUSCULAR | Status: DC | PRN

## 2022-01-09 MED ORDER — MIDAZOLAM HCL 5 MG/5ML IJ SOLN
INTRAMUSCULAR | Status: DC | PRN
  Administered 2022-01-09: 2 mg via INTRAVENOUS

## 2022-01-09 MED ORDER — DEXTROSE-NACL 5-0.9 % IV SOLN: INTRAVENOUS | Status: DC

## 2022-01-09 MED ORDER — CHLORHEXIDINE GLUCONATE 0.12 % MT SOLN
OROMUCOSAL | Status: AC
  Administered 2022-01-09: 15 mL via OROMUCOSAL
  Filled 2022-01-09: qty 15

## 2022-01-09 MED ORDER — ORAL CARE MOUTH RINSE: 15.0000 mL | Freq: Once | OROMUCOSAL | Status: AC

## 2022-01-09 MED ORDER — PROPOFOL 10 MG/ML IV BOLUS
INTRAVENOUS | Status: AC
  Filled 2022-01-09: qty 20

## 2022-01-09 MED ORDER — LIDOCAINE 2% (20 MG/ML) 5 ML SYRINGE
INTRAMUSCULAR | Status: DC | PRN
  Administered 2022-01-09: 100 mg via INTRAVENOUS

## 2022-01-09 MED ORDER — AMISULPRIDE (ANTIEMETIC) 5 MG/2ML IV SOLN: 10.0000 mg | Freq: Once | INTRAVENOUS | Status: DC | PRN

## 2022-01-09 MED ORDER — ACETAMINOPHEN 500 MG PO TABS
ORAL_TABLET | ORAL | Status: AC
  Administered 2022-01-09: 1000 mg via ORAL
  Filled 2022-01-09: qty 2

## 2022-01-09 MED ORDER — CHLORHEXIDINE GLUCONATE 0.12 % MT SOLN: 15.0000 mL | Freq: Once | OROMUCOSAL | Status: AC

## 2022-01-09 MED ORDER — ACETAMINOPHEN 500 MG PO TABS: 1000.0000 mg | ORAL_TABLET | ORAL | Status: AC

## 2022-01-09 MED ORDER — HYDROMORPHONE HCL 1 MG/ML IJ SOLN: 0.2500 mg | INTRAMUSCULAR | Status: DC | PRN

## 2022-01-09 MED ORDER — PHENYLEPHRINE HCL-NACL 20-0.9 MG/250ML-% IV SOLN
INTRAVENOUS | Status: DC | PRN
  Administered 2022-01-09: 40 ug/min via INTRAVENOUS

## 2022-01-09 MED ORDER — DEXAMETHASONE SODIUM PHOSPHATE 10 MG/ML IJ SOLN
INTRAMUSCULAR | Status: DC | PRN
  Administered 2022-01-09: 10 mg via INTRAVENOUS

## 2022-01-09 MED ORDER — ENSURE PRE-SURGERY PO LIQD: 296.0000 mL | Freq: Once | ORAL | Status: DC

## 2022-01-09 MED ORDER — LACTATED RINGERS IV SOLN: INTRAVENOUS | Status: DC

## 2022-01-09 MED ORDER — BUPIVACAINE LIPOSOME 1.3 % IJ SUSP
INTRAMUSCULAR | Status: AC
  Filled 2022-01-09: qty 20

## 2022-01-09 MED ORDER — PROPOFOL 10 MG/ML IV BOLUS
INTRAVENOUS | Status: DC | PRN
  Administered 2022-01-09: 120 mg via INTRAVENOUS

## 2022-01-09 MED ORDER — HYDROMORPHONE HCL 1 MG/ML IJ SOLN
1.0000 mg | INTRAMUSCULAR | Status: DC | PRN
  Administered 2022-01-09 – 2022-01-10 (×2): 1 mg via INTRAVENOUS
  Filled 2022-01-09 (×2): qty 1

## 2022-01-09 MED ORDER — SUCCINYLCHOLINE CHLORIDE 200 MG/10ML IV SOSY
PREFILLED_SYRINGE | INTRAVENOUS | Status: DC | PRN
  Administered 2022-01-09: 120 mg via INTRAVENOUS

## 2022-01-09 MED ORDER — ONDANSETRON HCL 4 MG/2ML IJ SOLN
INTRAMUSCULAR | Status: AC
  Filled 2022-01-09: qty 2

## 2022-01-09 MED ORDER — ONDANSETRON HCL 4 MG/2ML IJ SOLN: 4.0000 mg | Freq: Four times a day (QID) | INTRAMUSCULAR | Status: DC | PRN

## 2022-01-09 MED ORDER — CEFAZOLIN SODIUM-DEXTROSE 2-4 GM/100ML-% IV SOLN
2.0000 g | INTRAVENOUS | Status: AC
  Administered 2022-01-09: 2 g via INTRAVENOUS

## 2022-01-09 MED ORDER — SODIUM CHLORIDE 0.9% FLUSH
INTRAVENOUS | Status: DC | PRN
  Administered 2022-01-09: 20 mL

## 2022-01-09 MED ORDER — DEXMEDETOMIDINE HCL IN NACL 200 MCG/50ML IV SOLN
INTRAVENOUS | Status: DC | PRN
  Administered 2022-01-09 (×2): 20 ug via INTRAVENOUS

## 2022-01-09 MED ORDER — STERILE WATER FOR IRRIGATION IR SOLN
Status: DC | PRN
  Administered 2022-01-09: 1000 mL

## 2022-01-09 MED ORDER — BUPIVACAINE LIPOSOME 1.3 % IJ SUSP
INTRAMUSCULAR | Status: DC | PRN
  Administered 2022-01-09: 20 mL

## 2022-01-09 SURGICAL SUPPLY — 67 items
ADH SKN CLS APL DERMABOND .7 (GAUZE/BANDAGES/DRESSINGS) ×2
APL PRP STRL LF DISP 70% ISPRP (MISCELLANEOUS) ×2
APPLIER CLIP 5 13 M/L LIGAMAX5 (MISCELLANEOUS)
APR CLP MED LRG 5 ANG JAW (MISCELLANEOUS)
BUTTON OLYMPUS DEFENDO 5 PIECE (MISCELLANEOUS) ×1 IMPLANT
CANNULA REDUC XI 12-8 STAPL (CANNULA) ×3
CANNULA REDUCER 12-8 DVNC XI (CANNULA) ×3 IMPLANT
CHLORAPREP W/TINT 26 (MISCELLANEOUS) ×4 IMPLANT
CLIP APPLIE 5 13 M/L LIGAMAX5 (MISCELLANEOUS) IMPLANT
COVER MAYO STAND STRL (DRAPES) ×4 IMPLANT
COVER SURGICAL LIGHT HANDLE (MISCELLANEOUS) ×4 IMPLANT
COVER TIP SHEARS 8 DVNC (MISCELLANEOUS) IMPLANT
COVER TIP SHEARS 8MM DA VINCI (MISCELLANEOUS)
DECANTER SPIKE VIAL GLASS SM (MISCELLANEOUS) ×4 IMPLANT
DEFOGGER SCOPE WARMER CLEARIFY (MISCELLANEOUS) ×4 IMPLANT
DERMABOND ADVANCED (GAUZE/BANDAGES/DRESSINGS) ×1
DERMABOND ADVANCED .7 DNX12 (GAUZE/BANDAGES/DRESSINGS) ×3 IMPLANT
DEVICE TROCAR PUNCTURE CLOSURE (ENDOMECHANICALS) ×4 IMPLANT
DRAIN PENROSE 0.5X18 (DRAIN) ×1 IMPLANT
DRAPE ARM DVNC X/XI (DISPOSABLE) ×12 IMPLANT
DRAPE CARDIOVASC SPLIT 88X140 (DRAPES) ×4 IMPLANT
DRAPE COLUMN DVNC XI (DISPOSABLE) ×3 IMPLANT
DRAPE DA VINCI XI ARM (DISPOSABLE) ×12
DRAPE DA VINCI XI COLUMN (DISPOSABLE) ×3
DRAPE ORTHO SPLIT 77X108 STRL (DRAPES) ×3
DRAPE SURG ORHT 6 SPLT 77X108 (DRAPES) ×3 IMPLANT
ELECT REM PT RETURN 9FT ADLT (ELECTROSURGICAL) ×3
ELECTRODE REM PT RTRN 9FT ADLT (ELECTROSURGICAL) ×3 IMPLANT
GLOVE SURG SYN 7.5  E (GLOVE) ×9
GLOVE SURG SYN 7.5 E (GLOVE) ×6 IMPLANT
GLOVE SURG SYN 7.5 PF PI (GLOVE) ×9 IMPLANT
GOWN STRL REUS W/ TWL LRG LVL3 (GOWN DISPOSABLE) ×3 IMPLANT
GOWN STRL REUS W/ TWL XL LVL3 (GOWN DISPOSABLE) ×6 IMPLANT
GOWN STRL REUS W/TWL 2XL LVL3 (GOWN DISPOSABLE) ×4 IMPLANT
GOWN STRL REUS W/TWL LRG LVL3 (GOWN DISPOSABLE) ×6
GOWN STRL REUS W/TWL XL LVL3 (GOWN DISPOSABLE) ×6
KIT BASIN OR (CUSTOM PROCEDURE TRAY) ×4 IMPLANT
KIT TURNOVER KIT B (KITS) IMPLANT
MARKER SKIN DUAL TIP RULER LAB (MISCELLANEOUS) ×4 IMPLANT
NDL INSUFFLATION 14GA 120MM (NEEDLE) ×3 IMPLANT
NEEDLE 22X1 1/2 (OR ONLY) (NEEDLE) ×4 IMPLANT
NEEDLE INSUFFLATION 14GA 120MM (NEEDLE) ×3 IMPLANT
OBTURATOR OPTICAL STANDARD 8MM (TROCAR)
OBTURATOR OPTICAL STND 8 DVNC (TROCAR)
OBTURATOR OPTICALSTD 8 DVNC (TROCAR) IMPLANT
PENCIL SMOKE EVACUATOR (MISCELLANEOUS) IMPLANT
SCISSORS LAP 5X35 DISP (ENDOMECHANICALS) IMPLANT
SEAL CANN UNIV 5-8 DVNC XI (MISCELLANEOUS) ×9 IMPLANT
SEAL XI 5MM-8MM UNIVERSAL (MISCELLANEOUS) ×9
SEALER VESSEL DA VINCI XI (MISCELLANEOUS) ×3
SEALER VESSEL EXT DVNC XI (MISCELLANEOUS) ×3 IMPLANT
SET IRRIG TUBING LAPAROSCOPIC (IRRIGATION / IRRIGATOR) ×4 IMPLANT
SET TUBE SMOKE EVAC HIGH FLOW (TUBING) ×4 IMPLANT
STAPLER CANNULA SEAL DVNC XI (STAPLE) ×3 IMPLANT
STAPLER CANNULA SEAL XI (STAPLE) ×3
STAPLER VISISTAT 35W (STAPLE) IMPLANT
STOPCOCK 4 WAY LG BORE MALE ST (IV SETS) ×4 IMPLANT
SUT ETHIBOND 0 36 GRN (SUTURE) ×8 IMPLANT
SUT ETHIBOND 2 0 SH (SUTURE)
SUT ETHIBOND 2 0 SH 36X2 (SUTURE) IMPLANT
SUT MNCRL AB 4-0 PS2 18 (SUTURE) ×4 IMPLANT
SUT SILK 0 SH 30 (SUTURE) ×4 IMPLANT
SYR 30ML SLIP (SYRINGE) ×4 IMPLANT
TRAY FOLEY MTR SLVR 16FR STAT (SET/KITS/TRAYS/PACK) ×3 IMPLANT
TRAY LAPAROSCOPIC MC (CUSTOM PROCEDURE TRAY) ×4 IMPLANT
TROCAR ADV FIXATION 5X100MM (TROCAR) ×4 IMPLANT
TUBING ENDO SMARTCAP (MISCELLANEOUS) ×1 IMPLANT

## 2022-01-09 NOTE — H&P (Signed)
Chief Complaint: Hernia       History of Present Illness: Kathryn [name] is a 66 y.o. female who is seen today as an office consultation at the request of Dr. Therisa Doyne for evaluation of Hernia .   Patient is a 65 year old female who comes in secondary to history of dysphagia.  Patient states that over the last 2 years she has had some dysphagia with liquids and solids.  She feels that the liquids and solids lodged in the upper portion of her esophagus.  She states that she has some reflux as well as regurgitation of undigested food.  Patient underwent significant work-up which I reviewed personally.  Patient underwent esophagram.  This did show a moderately dilated esophagus.  Patient also went pH probe study as well as manometry testing.  pH probe study does reveal some reflux with a DeMeester score 41.  Patient also had a manometry test with hypertensive lower esophageal sphincter.  There appeared to be some aperistalsis of the esophagus.   I did discuss these findings with the patient   Patient also sees Dr. Rayann Heman for A. fib, patient is currently on Xarelto.   Patient's had no previous abdominal surgery.       Review of Systems: A complete review of systems was obtained from the patient.  I have reviewed this information and discussed as appropriate with the patient.  See HPI as well for other ROS.   Review of Systems  Constitutional: Negative for fever.  HENT: Negative for congestion.   Eyes: Negative for blurred vision.  Respiratory: Negative for cough, shortness of breath and wheezing.   Cardiovascular: Negative for chest pain and palpitations.  Gastrointestinal: Negative for heartburn.  Genitourinary: Negative for dysuria.  Musculoskeletal: Negative for myalgias.  Skin: Negative for rash.  Neurological: Negative for dizziness and headaches.  Psychiatric/Behavioral: Negative for depression and suicidal ideas.  All other systems reviewed and are negative.       Medical  History: Past Medical History Past Medical History: Diagnosis        Date            Anemia                         Anxiety                         Arrhythmia                    Arthritis                         GERD (gastroesophageal reflux disease)                   Thyroid disease                  There is no problem list on file for this patient.     Past Surgical History History reviewed. No pertinent surgical history.     Allergies No Known Allergies     Current Outpatient Medications on File Prior to Visit Medication       Sig       Dispense         Refill            acetaminophen (TYLENOL) 500 MG tablet   Take by mouth  ALPRAZolam (XANAX) 0.25 MG tablet                                                cholecalciferol (VITAMIN D3) 1000 unit capsule       Take by mouth                                      diltiazem (CARDIZEM) 30 MG tablet                                        DULoxetine (CYMBALTA) 30 MG DR capsule                                                 gabapentin (NEURONTIN) 300 MG capsule gabapentin 300 mg capsule                             hydroCHLOROthiazide (HYDRODIURIL) 25 MG tablet                                               levothyroxine (SYNTHROID) 200 MCG tablet                                                  methocarbamoL (ROBAXIN) 500 MG tablet                                         metoprolol succinate (TOPROL-XL) 100 MG XL tablet                                                omeprazole (PRILOSEC OTC) 20 MG EC tablet      Take 20 mg by mouth once daily                              oxyCODONE-acetaminophen (PERCOCET) 5-325 mg tablet                                                 tamoxifen (NOLVADEX) 20 MG tablet           tamoxifen 20 mg tablet                                     XARELTO  20 mg tablet                                        No current facility-administered medications on file prior to visit.      Family History Family History Problem           Relation           Age of Onset            High blood pressure (Hypertension)   Brother                    Social History   Tobacco Use Smoking Status           Never Smokeless Tobacco   Never     Social History Social History     Socioeconomic History            Marital status:  Married Tobacco Use            Smoking status:          Never            Smokeless tobacco:    Never Vaping Use            Vaping Use:    Never used Substance and Sexual Activity            Alcohol use:    Never            Drug use:        Never       Objective:     Vitals:              11/28/21 0837 BP:      (!) 144/86 Pulse:  101 Temp:  36.2 C (97.1 F) SpO2:  100% Weight:            100.4 kg (221 lb 6.4 oz) Height: 167.6 cm (5\' 6" )   Body mass index is 35.73 kg/m.   Physical Exam Constitutional:      Appearance: Normal appearance.  HENT:     Head: Normocephalic and atraumatic.     Mouth/Throat:     Mouth: Mucous membranes are moist.     Pharynx: Oropharynx is clear.  Eyes:     General: No scleral icterus.    Pupils: Pupils are equal, round, and reactive to light.  Cardiovascular:     Rate and Rhythm: Normal rate and regular rhythm.     Pulses: Normal pulses.     Heart sounds: No murmur heard.   No friction rub. No gallop.  Pulmonary:     Effort: Pulmonary effort is normal. No respiratory distress.     Breath sounds: Normal breath sounds. No stridor.  Abdominal:     General: Abdomen is flat.  Musculoskeletal:        General: No swelling.  Skin:    General: Skin is warm.  Neurological:     General: No focal deficit present.     Mental Status: She is alert and oriented to person, place, and time. Mental status is at baseline.  Psychiatric:        Mood and Affect: Mood normal.        Thought Content: Thought content normal.        Judgment: Judgment normal.            Assessment and Plan: Diagnoses  and all  orders for this visit:   Achalasia, esophageal   Chronic a-fib (CMS-HCC)   Primary hypertension     Kathryn [name] is a 66 y.o. female  I had a long discussion with the patient in regards to her case of achalasia.   We discussed the anatomy as well as the treatment for the achalasia.   Patient will require cardiac clearance for evaluation of her A. fib as well as to be off the Xarelto.   1.  Once we have received clearance we will plan on robotic Heller myotomy and Dor fundoplication. 2.  Discussed with her the risk benefits of the procedure to include but not limited to: Infection, bleeding, damage surrounding structures, possible pneumothorax, possible recurrence and reflux in the future.  Patient voiced understanding wishes to proceed.

## 2022-01-09 NOTE — Transfer of Care (Signed)
Immediate Anesthesia Transfer of Care Note  Patient: Kathryn [name]  Procedure(s) Performed: XI ROBOTIC ASSISTED HELLER MYOTOMY (Abdomen) INTRAOPERATIVE ENDOSCOPY  Patient Location: PACU  Anesthesia Type:General  Level of Consciousness: awake and alert   Airway & Oxygen Therapy: Patient Spontanous Breathing and Patient connected to nasal cannula oxygen  Post-op Assessment: Report given to RN and Post -op Vital signs reviewed and stable  Post vital signs: Reviewed and stable  Last Vitals:  Vitals Value Taken Time  BP 140/80 01/09/22 1600  Temp    Pulse 70 01/09/22 1601  Resp 16 01/09/22 1601  SpO2 100 % 01/09/22 1601  Vitals shown include unvalidated device data.  Last Pain:  Vitals:   01/09/22 1016  TempSrc:   PainSc: 0-No pain      Patients Stated Pain Goal: 0 (76/15/18 3437)  Complications: No notable events documented.

## 2022-01-09 NOTE — Op Note (Signed)
Kathryn [name] [id number] 1956-09-14 01/09/2022  Preoperative diagnosis: achalasia  Postoperative diagnosis: Same   Procedure: Upper endoscopy   Surgeon: Gayland Curry M.D., FACS   Anesthesia: Gen.   Indications for procedure: 66 y.o. yo female undergoing a robotic Heller myotomy with Dor fundoplication by Dr. Rosendo Gros and an upper endoscopy was requested to evaluate the esophagus.  Description of procedure: Dr. Rosendo Gros had completed the [facility] myotomy. I obtained the Olympus endoscope. I gently placed endoscope in the patient's oropharynx and gently glided it down the esophagus without any difficulty under direct visualization.  The mid esophagus appeared normal.  The distal esophagus was visualized.  The Z-line was at approximately 39 cm from the incisors.  The Z-line appeared mostly regular.  There was no overt evidence of esophagitis.  The anterior wall of the esophagus was intact however we could readily see the light from the robotic camera intraluminally.  The endoscope was advanced into the stomach.  There is no significant evidence of gastric irritation.  The scope was gently pulled back.  There were several centimeters of intra-abdominal esophagus.  Again the distal esophagus was circumferentially inspected.  There is no evidence of full-thickness defect.  The anterior layer of the esophagus was intact.  The stomach and esophagus was decompressed and the scope was removed.  Please see Dr. Johney Frame op note for additional detail.   Leighton Ruff. Redmond Pulling, MD, FACS General, Bariatric, & Minimally Invasive Surgery [facility], [place]

## 2022-01-09 NOTE — Anesthesia Procedure Notes (Signed)
Procedure Name: Intubation Date/Time: 01/09/2022 1:49 PM Performed by: Kyung Rudd, CRNA Pre-anesthesia Checklist: Patient identified, Emergency Drugs available, Suction available and Patient being monitored Patient Re-evaluated:Patient Re-evaluated prior to induction Oxygen Delivery Method: Circle system utilized Preoxygenation: Pre-oxygenation with 100% oxygen Induction Type: IV induction Ventilation: Mask ventilation without difficulty Laryngoscope Size: Mac and 3 Grade View: Grade I Tube type: Oral Number of attempts: 1 Airway Equipment and Method: Stylet and Oral airway Placement Confirmation: ETT inserted through vocal cords under direct vision, positive ETCO2 and breath sounds checked- equal and bilateral Tube secured with: Tape Dental Injury: Teeth and Oropharynx as per pre-operative assessment  Comments: AOI by Danton Clap, paramedic student, with Dr. Royce Macadamia supervising.

## 2022-01-09 NOTE — Discharge Instructions (Signed)
EATING AFTER YOUR ESOPHAGEAL SURGERY (Stomach Fundoplication, Hiatal Hernia repair, Achalasia surgery, etc)  ######################################################################  EAT Start with a pureed / full liquid diet (see below) Gradually transition to a high fiber diet with a fiber supplement over the next month after discharge.    WALK Walk an hour a day.  Control your pain to do that.    CONTROL PAIN Control pain so that you can walk, sleep, tolerate sneezing/coughing, go up/down stairs.  HAVE A BOWEL MOVEMENT DAILY Keep your bowels regular to avoid problems.  OK to try a laxative to override constipation.  OK to use an antidairrheal to slow down diarrhea.  Call if not better after 2 tries  CALL IF YOU HAVE PROBLEMS/CONCERNS Call if you are still struggling despite following these instructions. Call if you have concerns not answered by these instructions  ######################################################################   After your esophageal surgery, expect some sticking with swallowing over the next 1-2 months.    If food sticks when you eat, it is called "dysphagia".  This is due to swelling around your esophagus at the wrap & hiatal diaphragm repair.  It will gradually ease off over the next few months.  To help you through this temporary phase, we start you out on a pureed (blenderized) diet.  Your first meal in the hospital was thin liquids.  You should have been given a pureed diet by the time you left the hospital.  We ask patients to stay on a pureed diet for the first 2-3 weeks to avoid anything getting "stuck" near your recent surgery.  Don't be alarmed if your ability to swallow doesn't progress according to this plan.  Everyone is different and some diets can advance more or less quickly.    It is often helpful to crush your medications or split them as they can sometimes stick, especially the first week or so.   Some BASIC RULES to follow  are:  Maintain an upright position whenever eating or drinking.  Take small bites - just a teaspoon size bite at a time.  Eat slowly.  It may also help to eat only one food at a time.  Consider nibbling through smaller, more frequent meals & avoid the urge to eat BIG meals  Do not push through feelings of fullness, nausea, or bloatedness  Do not mix solid foods and liquids in the same mouthful  Try not to "wash foods down" with large gulps of liquids.  Avoid carbonated (bubbly/fizzy) drinks.    Avoid foods that make you feel gassy or bloated.  Start with bland foods first.  Wait on trying greasy, fried, or spicy meals until you are tolerating more bland solids well.  Understand that it will be hard to burp and belch at first.  This gradually improves with time.  Expect to be more gassy/flatulent/bloated initially.  Walking will help your body manage it better.  Consider using medications for bloating that contain simethicone such as  Maalox or Gas-X   Consider crushing her medications, especially smaller pills.  The ability to swallow pills should get easier after a few weeks  Eat in a relaxed atmosphere & minimize distractions.  Avoid talking while eating.    Do not use straws.  Following each meal, sit in an upright position (90 degree angle) for 60 to 90 minutes.  Going for a short walk can help as well  If food does stick, don't panic.  Try to relax and let the food pass on its own.    Be gradual in changes & use common sense:  -If you easily tolerating a certain "level" of foods, advance to the next level gradually -If you are having trouble swallowing a particular food, then avoid it.   -If food is sticking when you advance your diet, go back to thinner previous diet (the lower LEVEL) for 1-2 days.  LEVEL 1 = PUREED DIET  Do for the first 2 WEEKS AFTER SURGERY  -Foods in this group are pureed or blenderized to a smooth, mashed  potato-like consistency.  -If necessary, the pureed foods can keep their shape with the addition of a thickening agent.   -Meat should be pureed to a smooth, pasty consistency.  Hot broth or gravy may be added to the pureed meat, approximately 1 oz. of liquid per 3 oz. serving of meat. -CAUTION:  If any foods do not puree into a smooth consistency, swallowing will be more difficult.  (For example, nuts or seeds sometimes do not blend well.)  Hot Foods Cold Foods  Pureed scrambled eggs and cheese Pureed cottage cheese  Baby cereals Thickened juices and nectars  Thinned cooked cereals (no lumps) Thickened milk or eggnog  Pureed [place] toast or pancakes Ensure  Mashed potatoes Ice cream  Pureed parsley, au gratin, scalloped potatoes, candied sweet potatoes Fruit or [place] ice, sherbet  Pureed buttered or alfredo noodles Plain yogurt  Pureed vegetables (no corn or peas) Instant breakfast  Pureed soups and creamed soups Smooth pudding, mousse, custard  Pureed scalloped apples Whipped gelatin  Gravies Sugar, syrup, honey, jelly  Sauces, cheese, tomato, barbecue, white, creamed Cream  Any baby food Creamer  Alcohol in moderation (not beer or champagne) Margarine  Coffee or tea Mayonnaise   Ketchup, mustard   Apple sauce   SAMPLE MENU:  PUREED DIET Breakfast Lunch Dinner  Orange juice, 1/2 cup Cream of wheat, 1/2 cup Pineapple juice, 1/2 cup Pureed [place], barley soup, 3/4 cup Pureed [place] chicken, 3 oz  Scrambled eggs, mashed or blended with cheese, 1/2 cup Tea or coffee, 1 cup  Whole milk, 1 cup  Non-dairy creamer, 2 Tbsp. Mashed potatoes, 1/2 cup Pureed cooled broccoli, 1/2 cup Apple sauce, 1/2 cup Coffee or tea Mashed potatoes, 1/2 cup Pureed spinach, 1/2 cup Frozen yogurt, 1/2 cup Tea or coffee   TROUBLESHOOTING IRREGULAR BOWELS  1) Avoid extremes of bowel movements (no bad constipation/diarrhea)  2) Miralax 17gm mixed in 8oz. water or juice-daily. May use BID as needed.   3) Gas-x,Phazyme, etc. as needed for gas & bloating.  4) Soft,bland diet. No spicy,greasy,fried foods.  5) Prilosec over-the-counter as needed  6) May hold gluten/wheat products from diet to see if symptoms improve.  7) May try probiotics (Align, Activa, etc) to help calm the bowels down  7) If symptoms become worse call back immediately.    If you have any questions please call our office at [place]: [phone].

## 2022-01-09 NOTE — Op Note (Addendum)
01/09/2022  3:30 PM  PATIENT:  Kathryn [name]  66 y.o. female  PRE-OPERATIVE DIAGNOSIS:  ACHALASIA  POST-OPERATIVE DIAGNOSIS:  ACHALASIA  PROCEDURE:  Procedure(s): XI ROBOTIC ASSISTED HELLER MYOTOMY (N/A) INTRAOPERATIVE ENDOSCOPY (N/A)  SURGEON:  Surgeon(s) and Role:    * Ralene Ok, MD - Primary    Greer Pickerel, MD - Assisting and was essential and upper endoscopy while I was able to manipulate the esophagus intraabdominaly.  ASSISTANTS: Pryor Curia, RNFA   ANESTHESIA:   local and general  EBL:  minimal   BLOOD ADMINISTERED:none  DRAINS: none   LOCAL MEDICATIONS USED:  BUPIVICAINE   SPECIMEN:  No Specimen  DISPOSITION OF SPECIMEN:  N/A  COUNTS:  YES  TOURNIQUET:  * No tourniquets in log *  DICTATION: .Dragon Dictation The patient was taken back to the operating room and placed in the supine position with bilateral SCDs in place. The patient was prepped and draped in the usual sterile fashion. After appropriate antibiotics were confirmed a timeout was called and all facts were verified.   A Veress needle technique was used to insufflate the abdomen to 15 mm of mercury the paramedian stab incision. Subsequent to this an 8 mm trocar was introduced as was a 8 millimeter camera. At this time the subsequent robotic trochars x3, were then placed adjacent to this trocar approximately 8-10 cm away. Each trocar was inserted under direct visualization, there were total of 4 trochars. The assistant trocar was then placed in the right lower quadrant under direct visualization. The Nathanson retractor was then visualized inserted into the abdomen and the incision just to the left of the falciform ligament. This was then placed to retract the liver appropriately. At this time the patient was positioned in reverse Trendelenburg.   At this time the robot patient cart was brought to the bedside and placed in good position and the arms were docked to the trochars appropriately.  At this time I proceeded to incised the gastrohepatic ligament.  At this time I proceeded to mobilize the stomach inferiorly and visualize the right crus. The peritoneum over the right crus was incised and right crus was identified. I proceeded to dissect this inferiorly until the left crus was seen joining the right crus. Once the right crus was adequately dissected we turned our to the left crus which was dissected away. This required traction of the stomach to the right side. Once this was visualized we then proceeded to circumferentially dissect the esophagus away from the surrounding tissue. At this time a Penrose drain was placed around the esophagus to help with retraction.  I mobilized the esophagus cephalad approximately 5-6cm, clearing away the surrounding tissue.   At this time using the hook the esophageal fibers were individually cauterized on  the esophagus. The esophageal serosa was easily seen bulging through the incised muscle fibers. We carried this dissection cephalad. This was carried approximately 5 cm cephalad and measured with a ruler. At this time we proceeded to carry the myotomy caudad onto the body of the stomach. We did this approximately 3 cm onto the body of the stomach. The serosa appeared unharmed.  At this time an EGD was done per Dr. Dois Davenport dictation.  At this time I proceeded to close the hiatus using interrupted 0 Ethibond 1.  This brought together the hiatal closure without undue stricture to the esophagus, however was snugged.  At this time the greater curvature was brought anterior to the esophagus, in a Dor fashion, and  sutured using 0 silk sutures interrupted fashion.  The stomach was sutured to the crus and the esophageal muscle fibers laterally 3 on each side.  The Dor fundoplication appeared snug however was not constricting the esophagus.  At this time we revisualized stomach and it appeared to be lying appropriately or the esophagus.   At this time the  robot was undocked. The liver trocar was removed. At this time insufflation was evacuated. Skin was reapproximated for Monocryl subcuticular fashion. The skin was then dressed with Dermabond. The patient tolerated the procedure well and was taken to the recovery room in stable condition.   PATIENT DISPOSITION:  PACU - hemodynamically stable.   Delay start of Pharmacological VTE agent (>24hrs) due to surgical blood loss or risk of bleeding: not applicable

## 2022-01-09 NOTE — Progress Notes (Signed)
Patient's updated on delay in going to OR.  Per the patient's request, LVM for patient's spouse informing him of patient's delay in going to the OR.

## 2022-01-10 ENCOUNTER — Inpatient Hospital Stay (HOSPITAL_COMMUNITY): Payer: Medicare Other

## 2022-01-10 MED ORDER — IOHEXOL 300 MG/ML  SOLN
200.0000 mL | Freq: Once | INTRAMUSCULAR | Status: AC | PRN
  Administered 2022-01-10: 200 mL via ORAL

## 2022-01-10 MED ORDER — HYDROCODONE-ACETAMINOPHEN 7.5-325 MG/15ML PO SOLN: 15.0000 mL | Freq: Four times a day (QID) | ORAL | 0 refills | Status: DC | PRN

## 2022-01-10 NOTE — Progress Notes (Signed)
Patient notice swelling on R ant FA close to IV site. No acute bleed seen on previous IV site but pressure applied just the same. Compression dressing applied and advise patient to monitor when discharged.

## 2022-01-10 NOTE — Progress Notes (Signed)
1 Day Post-Op   Subjective/Chief Complaint: Doing well Min abd pain   Objective: Vital signs in last 24 hours: Temp:  [97.6 F (36.4 C)-98.7 F (37.1 C)] 97.6 F (36.4 C) (02/18 0513) Pulse Rate:  [62-86] 81 (02/18 0513) Resp:  [14-21] 18 (02/18 0513) BP: (95-176)/(52-93) 124/60 (02/18 0513) SpO2:  [87 %-100 %] 95 % (02/18 0513) Weight:  [98.9 kg] 98.9 kg (02/17 1002) Last BM Date : 01/08/22  Intake/Output from previous day: 02/17 0701 - 02/18 0700 In: 2561 [I.V.:2561] Out: -  Intake/Output this shift: Total I/O In: 811 [I.V.:811] Out: -   General appearance: alert and cooperative GI: soft, non-tender; bowel sounds normal; no masses,  no organomegaly and inc c/d/i  Lab Results:  No results for input(s): WBC, HGB, HCT, PLT in the last 72 hours. BMET No results for input(s): NA, K, CL, CO2, GLUCOSE, BUN, CREATININE, CALCIUM in the last 72 hours. PT/INR No results for input(s): LABPROT, INR in the last 72 hours. ABG No results for input(s): PHART, HCO3 in the last 72 hours.  Invalid input(s): PCO2, PO2  Studies/Results: MM DIAG BREAST TOMO BILATERAL  Result Date: 01/08/2022 CLINICAL DATA:  Palpable lump in the superior left breast felt by the patient's physician. The patient cannot feel the lump and the site of the lump was not otherwise designated. EXAM: DIGITAL DIAGNOSTIC BILATERAL MAMMOGRAM WITH TOMOSYNTHESIS AND CAD TECHNIQUE: Bilateral digital diagnostic mammography and breast tomosynthesis was performed. The images were evaluated with computer-aided detection. COMPARISON:  Previous exam(s). ACR Breast Density Category b: There are scattered areas of fibroglandular density. FINDINGS: Postsurgical changes on the right are stable. No suspicious masses, calcifications, or distortion identified in either breast. IMPRESSION: No mammographic evidence of malignancy. RECOMMENDATION: Treatment of the palpable lump should be based on clinical and physical exam given lack of  imaging findings. Annual screening mammography. I have discussed the findings and recommendations with the patient. If applicable, a reminder letter will be sent to the patient regarding the next appointment. BI-RADS CATEGORY  2: Benign. Electronically Signed   By: Dorise Bullion III M.D.   On: 01/08/2022 14:19   Anti-infectives: Anti-infectives (From admission, onward)    Start     Dose/Rate Route Frequency Ordered Stop   01/09/22 1130  ceFAZolin (ANCEF) IVPB 2g/100 mL premix        2 g 200 mL/hr over 30 Minutes Intravenous On call to O.R. 01/09/22 0953 01/09/22 1355   01/09/22 1004  ceFAZolin (ANCEF) 2-4 GM/100ML-% IVPB       Note to Pharmacy: Nena Polio: cabinet override      01/09/22 1004 01/09/22 1453       Assessment/Plan: s/p Procedure(s): XI ROBOTIC ASSISTED HELLER MYOTOMY (N/A) INTRAOPERATIVE ENDOSCOPY (N/A) -await esophagram if no leak, Ok for liquids and will DC with pureed diet -if feeling well later today and above study OK, she may Dc  LOS: 1 day    Ralene Ok 01/10/2022

## 2022-01-10 NOTE — Anesthesia Postprocedure Evaluation (Signed)
Anesthesia Post Note  Patient: Kathryn [name]  Procedure(s) Performed: XI ROBOTIC ASSISTED HELLER MYOTOMY (Abdomen) INTRAOPERATIVE ENDOSCOPY     Patient location during evaluation: PACU Anesthesia Type: General Level of consciousness: awake Pain management: pain level controlled Vital Signs Assessment: post-procedure vital signs reviewed and stable Respiratory status: spontaneous breathing, nonlabored ventilation, respiratory function stable and patient connected to nasal cannula oxygen Cardiovascular status: blood pressure returned to baseline and stable Postop Assessment: no apparent nausea or vomiting Anesthetic complications: no   No notable events documented.  Last Vitals:  Vitals:   01/09/22 2236 01/10/22 0513  BP: (!) 121/93 124/60  Pulse: 79 81  Resp: 20 18  Temp: 36.9 C 36.4 C  SpO2: 98% 95%    Last Pain:  Vitals:   01/10/22 0812  TempSrc:   PainSc: 0-No pain                 Aryianna Earwood P Janasia Coverdale

## 2022-01-10 NOTE — Plan of Care (Signed)
Full liquids tolerated. Ready to go home.

## 2022-01-11 ENCOUNTER — Encounter (HOSPITAL_COMMUNITY): Payer: Self-pay | Admitting: General Surgery

## 2022-01-12 NOTE — Discharge Summary (Signed)
Patient ID: Kathryn [name] [id number] [birth year] 66 y.o.  Admit date: 01/09/2022 Discharge date: 01/12/2022  Admitting Diagnosis: achalasia  Discharge Diagnosis Patient Active Problem List   Diagnosis Date Noted   Esophageal achalasia 01/09/2022   Chronic left-sided lumbar radiculopathy 03/27/2021   Lumbar radiculopathy, chronic 11/21/2019   Spondylolisthesis of lumbar region 07/04/2019   Spondylolisthesis at L4-L5 level 07/04/2019   Atypical lobular hyperplasia [facility]) of right breast 02/09/2017   Osteoarthritis of knee 01/15/2012   HYPOTHYROIDISM 07/26/2008   HYPERLIPIDEMIA 07/26/2008   HYPERTENSION 07/26/2008   DYSPNEA 07/26/2008   CARPAL TUNNEL SYNDROME, HX OF 07/26/2008    Consultants none  Reason for Admission: achalasia  Procedures [facility] myotomy  Hospital Course:  Uncomplicated    Physical Exam: Gen: comfortable, no distress Neuro: non-focal exam HEENT: PERRL Neck: supple CV: RRR Pulm: unlabored breathing Abd: soft, appropriately TTP, incisions c/d/i GU: clear yellow urine Extr: wwp, no edema   Allergies as of 01/10/2022       Reactions   Coumadin [warfarin] Diarrhea   Muscle pain        Medication List     TAKE these medications    ALPRAZolam 0.25 MG tablet Commonly known as: XANAX Take 0.25 mg by mouth at bedtime.   diltiazem 30 MG tablet Commonly known as: Cardizem Take 1 tablet every 4 hours AS NEEDED for heart rate >100 as long as blood pressure >100.   DULoxetine 30 MG capsule Commonly known as: CYMBALTA Take 30 mg by mouth See admin instructions. Take with 60 mg to equal 90 mg at night   DULoxetine 60 MG capsule Commonly known as: CYMBALTA Take 60 mg by mouth See admin instructions. Take with 30 mg to equal 90 mg at night   gabapentin 300 MG capsule Commonly known as: NEURONTIN Take 1 capsule by mouth See admin instructions. Take 300 mg twice daily, may take a third 300 mg dose as needed for pain    hydrochlorothiazide 25 MG tablet Commonly known as: HYDRODIURIL Take 1 tablet (25 mg total) by mouth daily. Appointment Required For Further Refills [phone]   HYDROcodone-acetaminophen 7.5-325 mg/15 ml solution Commonly known as: HYCET Take 15 mLs by mouth 4 (four) times daily as needed for moderate pain.   levothyroxine 200 MCG tablet Commonly known as: SYNTHROID Take 200 mcg by mouth daily before breakfast.   methocarbamol 500 MG tablet Commonly known as: ROBAXIN Take 1 tablet (500 mg total) by mouth every 6 (six) hours as needed for muscle spasms.   metoprolol succinate 100 MG 24 hr tablet Commonly known as: TOPROL-XL Take 1 tablet (100 mg total) by mouth daily. Take with or immediately following a meal.   omeprazole 20 MG tablet Commonly known as: PRILOSEC OTC Take 20 mg by mouth daily.   oxyCODONE-acetaminophen 5-325 MG tablet Commonly known as: PERCOCET/ROXICET Take 1-2 tablets by mouth every 4 (four) hours as needed for moderate pain or severe pain. What changed: when to take this   tamoxifen 20 MG tablet Commonly known as: NOLVADEX Take 1 tablet (20 mg total) by mouth daily.   Vitamin D3 25 MCG (1000 UT) Caps Take 1,000 Units by mouth daily.   Xarelto 20 MG Tabs tablet Generic drug: rivaroxaban TAKE 1 TABLET BY MOUTH DAILY WITH SUPPER          Follow-up Information     Ralene Ok, MD. Schedule an appointment as soon as possible for a visit in 2 week(s).   Specialty: General Surgery Why: Post  op visit Contact information: [address] [place] [employer] [id number] [phone]                  Signed: Jesusita Oka, [facility] 01/12/2022, 2:14 PM

## 2022-01-30 ENCOUNTER — Other Ambulatory Visit (HOSPITAL_COMMUNITY): Payer: Self-pay | Admitting: Nurse Practitioner

## 2022-02-20 ENCOUNTER — Other Ambulatory Visit: Payer: Self-pay | Admitting: Internal Medicine

## 2022-02-20 ENCOUNTER — Other Ambulatory Visit (HOSPITAL_COMMUNITY): Payer: Self-pay | Admitting: Nurse Practitioner

## 2022-02-20 DIAGNOSIS — I10 Essential (primary) hypertension: Secondary | ICD-10-CM | POA: Diagnosis not present

## 2022-02-20 DIAGNOSIS — D509 Iron deficiency anemia, unspecified: Secondary | ICD-10-CM | POA: Diagnosis not present

## 2022-02-20 DIAGNOSIS — I700 Atherosclerosis of aorta: Secondary | ICD-10-CM | POA: Diagnosis not present

## 2022-02-20 DIAGNOSIS — M1A00X0 Idiopathic chronic gout, unspecified site, without tophus (tophi): Secondary | ICD-10-CM | POA: Diagnosis not present

## 2022-02-20 DIAGNOSIS — E039 Hypothyroidism, unspecified: Secondary | ICD-10-CM | POA: Diagnosis not present

## 2022-02-20 DIAGNOSIS — E785 Hyperlipidemia, unspecified: Secondary | ICD-10-CM | POA: Diagnosis not present

## 2022-02-20 DIAGNOSIS — I480 Paroxysmal atrial fibrillation: Secondary | ICD-10-CM | POA: Diagnosis not present

## 2022-02-25 ENCOUNTER — Other Ambulatory Visit: Payer: Self-pay | Admitting: *Deleted

## 2022-02-25 DIAGNOSIS — I4891 Unspecified atrial fibrillation: Secondary | ICD-10-CM

## 2022-02-25 DIAGNOSIS — M5416 Radiculopathy, lumbar region: Secondary | ICD-10-CM | POA: Diagnosis not present

## 2022-02-25 DIAGNOSIS — Z9889 Other specified postprocedural states: Secondary | ICD-10-CM | POA: Diagnosis not present

## 2022-02-25 DIAGNOSIS — G894 Chronic pain syndrome: Secondary | ICD-10-CM | POA: Diagnosis not present

## 2022-02-25 MED ORDER — RIVAROXABAN 20 MG PO TABS: 20.0000 mg | ORAL_TABLET | Freq: Every day | ORAL | 1 refills | Status: DC

## 2022-02-25 NOTE — Telephone Encounter (Signed)
Prescription refill request for Xarelto received.  ?Indication:Afib  ?Last office visit:10/01/21 Kayleen Memos) ?Weight: 98.9kg ?Age: 66 ?Scr: 1.20 (01/06/22) ?CrCl: 71.59m/min ? ?Appropriate dose and refill sent to requested pharmacy.  ?

## 2022-03-02 ENCOUNTER — Other Ambulatory Visit (HOSPITAL_COMMUNITY): Payer: Self-pay | Admitting: Nurse Practitioner

## 2022-03-02 ENCOUNTER — Other Ambulatory Visit: Payer: Self-pay | Admitting: Hematology and Oncology

## 2022-03-10 ENCOUNTER — Telehealth: Payer: Self-pay | Admitting: *Deleted

## 2022-03-10 ENCOUNTER — Inpatient Hospital Stay: Payer: Medicare Other | Admitting: Hematology and Oncology

## 2022-03-10 NOTE — Telephone Encounter (Signed)
Patient Kathryn [name] earlier stating she had cancelled 11 am appt this AM with Dr. Chryl Heck and requested call from nurse. ?Attempted to contact patient - reached named vm and left message for her to contact office or send mychart message ?

## 2022-03-12 ENCOUNTER — Telehealth: Payer: Self-pay

## 2022-03-12 ENCOUNTER — Telehealth: Payer: Self-pay | Admitting: Hematology and Oncology

## 2022-03-12 ENCOUNTER — Other Ambulatory Visit (HOSPITAL_COMMUNITY): Payer: Self-pay

## 2022-03-12 ENCOUNTER — Encounter: Payer: Self-pay | Admitting: Hematology and Oncology

## 2022-03-12 MED ORDER — HYDROCHLOROTHIAZIDE 25 MG PO TABS: 25.0000 mg | ORAL_TABLET | Freq: Every day | ORAL | 0 refills | Status: DC

## 2022-03-12 NOTE — Telephone Encounter (Signed)
.  Called pt per 4/19 inbasket , Patient was unavailable, a message with appt time and date was left with number on file.   ?

## 2022-03-12 NOTE — Telephone Encounter (Signed)
Pt called and LVM requesting to r/s appt she missed 4/18. Message sent to scheduling for them to call her regarding this matter. ?

## 2022-03-17 ENCOUNTER — Ambulatory Visit (HOSPITAL_COMMUNITY)
Admission: RE | Admit: 2022-03-17 | Discharge: 2022-03-17 | Disposition: A | Discharge status: Home / Self Care | Payer: Medicare Other | Source: Ambulatory Visit | Attending: Nurse Practitioner | Admitting: Nurse Practitioner

## 2022-03-17 ENCOUNTER — Telehealth: Payer: Self-pay | Admitting: Hematology and Oncology

## 2022-03-17 ENCOUNTER — Ambulatory Visit (HOSPITAL_COMMUNITY): Payer: Medicare Other | Admitting: Nurse Practitioner

## 2022-03-17 VITALS — BP 114/70 | HR 79 | Ht 66.0 in | Wt 216.4 lb

## 2022-03-17 DIAGNOSIS — I480 Paroxysmal atrial fibrillation: Secondary | ICD-10-CM | POA: Diagnosis not present

## 2022-03-17 DIAGNOSIS — I4891 Unspecified atrial fibrillation: Secondary | ICD-10-CM | POA: Diagnosis not present

## 2022-03-17 DIAGNOSIS — R0609 Other forms of dyspnea: Secondary | ICD-10-CM | POA: Diagnosis not present

## 2022-03-17 DIAGNOSIS — I4892 Unspecified atrial flutter: Secondary | ICD-10-CM | POA: Diagnosis not present

## 2022-03-17 DIAGNOSIS — D6869 Other thrombophilia: Secondary | ICD-10-CM | POA: Diagnosis not present

## 2022-03-17 DIAGNOSIS — R42 Dizziness and giddiness: Secondary | ICD-10-CM | POA: Insufficient documentation

## 2022-03-17 DIAGNOSIS — Z7901 Long term (current) use of anticoagulants: Secondary | ICD-10-CM | POA: Diagnosis not present

## 2022-03-17 DIAGNOSIS — I10 Essential (primary) hypertension: Secondary | ICD-10-CM | POA: Diagnosis not present

## 2022-03-17 MED ORDER — RIVAROXABAN 20 MG PO TABS: 20.0000 mg | ORAL_TABLET | Freq: Every day | ORAL | 1 refills | Status: DC

## 2022-03-17 NOTE — Progress Notes (Signed)
? ?Primary Care Physician: [name], MD ?Referring Physician: Dr. Rayann Heman  ? ? ?Kathryn [name] is a 66 y.o. female with a h/o palpitations that recently saw [name] for afib/HTN that was noted at time of scheduled endoscopy/colonoscopy. Of note, she did not take her BB dose prior to procedure that day. The procedure was cancelled. She was sent to the ER and she  left 2/2 long wait in the lobby. She was feeling improved. She saw Dr. Rayann Heman in f/u and he increased her BB and scheduled an echo which is pending tomorrow. He also started her on xarelto 20 mg daily for a CHA2DS2VASc score of 3. She has not had any issues with the change in  her meds. Colonoscopy/endoscopy was scheduled as she saw blood in her stool in the past but has not seen any with start of DOAC. She also has issues with food getting hung from  time to time.  She still feels minutes of palpitations but no extended periods of afib. She limits caffeine, no alcohol or tobacco and husband reports some snoring but no apnea. She is in rhythm today.  ? ?F/u in afib clinic, 05/13/21. She has only had one episode of afib last week, she took a Cardizem right away and it was over in 15 mins. She has not had her endo/colonoscopy rescheduled yet. She went on to have repeat back surgery in May.  She states that ever since she started having back surgery 3 years ago, she has not been able to walk very long distances and has noted exertional dyspnea with this. No chest discomfort. Unchanged over the last 3 years. Very sedentary. No issues with blood thinner.  ? ?F/u in afib clinic, 03/17/22. She has not felt any issues with irregular heart beat but feels lightheaded at times. Sometimes  with position change, sometimes walking around. She is on HCTZ, admitted she probably does not good water intake.  ? ?Today, she denies symptoms of palpitations, chest pain, shortness of breath, orthopnea, PND, lower extremity edema, dizziness, presyncope, syncope, or  neurologic sequela. The patient is tolerating medications without difficulties and is otherwise without complaint today.  ? ?Past Medical History:  ?Diagnosis Date  ? Anxiety   ? Breast cancer ([place])   ? Cancer ([place]) 10/2016  ? Breast right,  lumpectomy,  did not require chemo/xrt  ? DDD (degenerative disc disease), cervical   ? Depression   ? Dyspnea   ? Dysrhythmia   ? A Fib  ? Elevated cholesterol   ? GERD (gastroesophageal reflux disease)   ? H/O hiatal hernia   ? Hypertension   ? Hypothyroidism   ? Paroxysmal atrial fibrillation ([place]) 11/20/2020  ? Tremor   ? patient states "tremor has gotten better" on 06/30/19  ? Wears partial dentures   ? upper  ? ?Past Surgical History:  ?Procedure Laterality Date  ? ABDOMINAL HYSTERECTOMY  yrs ago  ? ANTERIOR LAT LUMBAR FUSION N/A 11/21/2019  ? Procedure: Lumbar two-three Lumbar three-four Anterolateral lumbar interbody fusion with lateral plate;  Surgeon: Kristeen Miss, MD;  Location: [facility];  Service: Neurosurgery;  Laterality: N/A;  ? BREAST EXCISIONAL BIOPSY Right 11/2016  ? ALH and papillomas  ? BREAST LUMPECTOMY Right 2017  ? BREAST LUMPECTOMY WITH RADIOACTIVE SEED LOCALIZATION Right 12/15/2016  ? Procedure: RADIOACTIVE SEED X 2 GUIDED EXCISIONAL BREAST BIOPSY;  Surgeon: Rolm Bookbinder, MD;  Location: [facility];  Service: General;  Laterality: Right;  ? CARPAL TUNNEL RELEASE Bilateral yrs ago  ?  x 2  ? cervical neck fusion  yrs ago  ? C 3 and C 4   ? COLONOSCOPY    ? ESOPHAGOGASTRODUODENOSCOPY ENDOSCOPY N/A 01/09/2022  ? Procedure: INTRAOPERATIVE ENDOSCOPY;  Surgeon: Ralene Ok, MD;  Location: [facility];  Service: General;  Laterality: N/A;  ? EYE SURGERY Bilateral   ? Cataracts removed  ? left knee arthroscopy   last done 2008  ? x 3  ? left shoulder arthroscopy  yrs ago  ? lower back surgery  2005  ? right total knee replacement   2008  ? TOTAL KNEE ARTHROPLASTY  01/14/2012  ? Procedure: TOTAL KNEE ARTHROPLASTY;  Surgeon: Johnn Hai, MD;   Location: [facility];  Service: Orthopedics;  Laterality: Left;  preop femoral nerve block  ? UPPER GI ENDOSCOPY    ? WISDOM TOOTH EXTRACTION    ? ? ?Current Outpatient Medications  ?Medication Sig Dispense Refill  ? ALPRAZolam (XANAX) 0.25 MG tablet Take 0.25 mg by mouth at bedtime.    ? Cholecalciferol (VITAMIN D3) 25 MCG (1000 UT) CAPS Take 1,000 Units by mouth daily.    ? colchicine 0.6 MG tablet Take 0.6 mg by mouth as needed.    ? diltiazem (CARDIZEM) 30 MG tablet Take 1 tablet every 4 hours AS NEEDED for heart rate >100 as long as blood pressure >100. 30 tablet 1  ? DULoxetine (CYMBALTA) 30 MG capsule Take 30 mg by mouth See admin instructions. Take with 60 mg to equal 90 mg at night    ? DULoxetine (CYMBALTA) 60 MG capsule Take 60 mg by mouth See admin instructions. Take with 30 mg to equal 90 mg at night    ? gabapentin (NEURONTIN) 300 MG capsule Take 1 capsule by mouth See admin instructions. Take 300 mg twice daily, may take a third 300 mg dose as needed for pain    ? hydrochlorothiazide (HYDRODIURIL) 25 MG tablet Take 1 tablet (25 mg total) by mouth daily. appt needed for refill [phone] 30 tablet 0  ? levothyroxine (SYNTHROID) 200 MCG tablet Take 200 mcg by mouth daily before breakfast.    ? methocarbamol (ROBAXIN) 500 MG tablet Take 1 tablet (500 mg total) by mouth every 6 (six) hours as needed for muscle spasms. 40 tablet 3  ? metoprolol succinate (TOPROL-XL) 100 MG 24 hr tablet TAKE 1 TABLET BY MOUTH DAILY WITH OR IMMEDIATELY FOLLOWING A MEAL 90 tablet 0  ? omeprazole (PRILOSEC) 20 MG capsule Take 20 mg by mouth 2 (two) times daily.    ? oxyCODONE-acetaminophen (PERCOCET/ROXICET) 5-325 MG tablet Take 1-2 tablets by mouth every 4 (four) hours as needed for moderate pain or severe pain. (Patient taking differently: Take 1-2 tablets by mouth 3 (three) times daily as needed for moderate pain or severe pain.) 60 tablet 0  ? rivaroxaban (XARELTO) 20 MG TABS tablet Take 1 tablet (20 mg total) by mouth daily  with supper. 90 tablet 1  ? tamoxifen (NOLVADEX) 20 MG tablet TAKE 1 TABLET BY MOUTH DAILY 90 tablet 0  ? ?No current facility-administered medications for this encounter.  ? ? ?Allergies  ?Allergen Reactions  ? Coumadin [Warfarin] Diarrhea  ?  Muscle pain  ? ? ?Social History  ? ?Socioeconomic History  ? Marital status: Married  ?  Spouse name: Not on file  ? Number of children: Not on file  ? Years of education: Not on file  ? Highest education level: Not on file  ?Occupational History  ? Not on file  ?Tobacco  Use  ? Smoking status: Never  ? Smokeless tobacco: Never  ?Vaping Use  ? Vaping Use: Never used  ?Substance and Sexual Activity  ? Alcohol use: Yes  ?  Comment: occasional wine  ? Drug use: No  ? Sexual activity: Not on file  ?  Comment: Hysterectormy  ?Other Topics Concern  ? Not on file  ?Social History Narrative  ? Lives in [place] with spouse.  ? Retired Nurse, learning disability  ? ?Social Determinants of Health  ? ?Financial Resource Strain: Not on file  ?Food Insecurity: Not on file  ?Transportation Needs: Not on file  ?Physical Activity: Not on file  ?Stress: Not on file  ?Social Connections: Not on file  ?Intimate Partner Violence: Not on file  ? ? ?Family History  ?Problem Relation Age of Onset  ? Breast cancer Neg Hx   ? ? ?ROS- All systems are reviewed and negative except as per the HPI above ? ?Physical Exam: ?Vitals:  ? 03/17/22 1355  ?Weight: 98.2 kg  ?Height: '5\' 6"'$  (1.676 m)  ? ?Wt Readings from Last 3 Encounters:  ?03/17/22 98.2 kg  ?01/09/22 98.9 kg  ?01/06/22 99.1 kg  ? ? ?Labs: ?Lab Results  ?Component Value Date  ? NA 139 01/06/2022  ? K 3.5 01/06/2022  ? CL 100 01/06/2022  ? CO2 28 01/06/2022  ? GLUCOSE 122 (H) 01/06/2022  ? BUN 15 01/06/2022  ? CREATININE 1.20 (H) 01/06/2022  ? CALCIUM 9.1 01/06/2022  ? ?Lab Results  ?Component Value Date  ? INR 0.91 01/05/2012  ? ?No results found for: CHOL, HDL, LDLCALC, TRIG ? ? ?GEN- The patient is well appearing, alert and oriented x 3 today.    ?Head- normocephalic, atraumatic ?Eyes-  Sclera clear, conjunctiva pink ?Ears- hearing intact ?Oropharynx- clear ?Neck- supple, no JVP ?Lymph- no cervical lymphadenopathy ?Lungs- Clear to ausculation bilaterally, n

## 2022-03-17 NOTE — Telephone Encounter (Signed)
.  Called patient to schedule appointment per 4/24 inbasket, patient is aware of date and time.   ?

## 2022-03-17 NOTE — Addendum Note (Signed)
Encounter addended by: Enid Derry, CMA on: 03/17/2022 3:10 PM ? Actions taken: Order Reconciliation Section accessed, Home Medications modified

## 2022-03-26 ENCOUNTER — Ambulatory Visit: Payer: Medicare Other | Admitting: Hematology and Oncology

## 2022-04-03 ENCOUNTER — Inpatient Hospital Stay: Payer: Medicare Other | Attending: Hematology and Oncology | Admitting: Hematology and Oncology

## 2022-04-03 ENCOUNTER — Other Ambulatory Visit: Payer: Self-pay

## 2022-04-03 VITALS — BP 121/61 | HR 84 | Temp 97.5°F | Resp 15 | Wt 212.8 lb

## 2022-04-03 DIAGNOSIS — N6321 Unspecified lump in the left breast, upper outer quadrant: Secondary | ICD-10-CM | POA: Diagnosis not present

## 2022-04-03 DIAGNOSIS — N6341 Unspecified lump in right breast, subareolar: Secondary | ICD-10-CM | POA: Insufficient documentation

## 2022-04-03 DIAGNOSIS — N6311 Unspecified lump in the right breast, upper outer quadrant: Secondary | ICD-10-CM | POA: Insufficient documentation

## 2022-04-03 DIAGNOSIS — Z7901 Long term (current) use of anticoagulants: Secondary | ICD-10-CM | POA: Insufficient documentation

## 2022-04-03 DIAGNOSIS — N6091 Unspecified benign mammary dysplasia of right breast: Secondary | ICD-10-CM | POA: Insufficient documentation

## 2022-04-03 DIAGNOSIS — M62838 Other muscle spasm: Secondary | ICD-10-CM | POA: Insufficient documentation

## 2022-04-03 DIAGNOSIS — Z79899 Other long term (current) drug therapy: Secondary | ICD-10-CM | POA: Insufficient documentation

## 2022-04-03 DIAGNOSIS — N6041 Mammary duct ectasia of right breast: Secondary | ICD-10-CM | POA: Diagnosis not present

## 2022-04-03 DIAGNOSIS — M549 Dorsalgia, unspecified: Secondary | ICD-10-CM | POA: Diagnosis not present

## 2022-04-03 DIAGNOSIS — N6315 Unspecified lump in the right breast, overlapping quadrants: Secondary | ICD-10-CM | POA: Diagnosis not present

## 2022-04-03 NOTE — Progress Notes (Signed)
[name]: ?[name], MD as PCP - General (Endocrinology) ?Thompson Grayer, MD as PCP - Cardiology (Cardiology) ?Magrinat, Virgie Dad, MD (Inactive) as Consulting Physician (Oncology) ?Rolm Bookbinder, MD as Consulting Physician (General Surgery) ?Kristeen Miss, MD as Consulting Physician (Neurosurgery) ?Laurence Spates, MD (Inactive) as Consulting Physician (Gastroenterology) ? ?CHIEF COMPLAINTS/PURPOSE OF CONSULTATION:  ?ALH ? ?ASSESSMENT & PLAN:  ? ?This is a very pleasant 66 year old postmenopausal female patient with right breast atypical lobular hyperplasia status post right retroareolar lumpectomy in January 2018 followed by prophylactic tamoxifen since March 2018 followed with yearly mammogram reestablishing with high risk breast clinic since she was lost to follow-up. ?She has now completed 5 years of tamoxifen and is currently here for follow-up.  No concerning review of systems or physical examination findings. ?Last mammogram in January without any evidence of malignancy.  She will continue annual mammograms.  She will return to clinic in 6 months for physical exam or sooner as needed. ?We have discussed that although she stops tamoxifen, we believe the benefit of tamoxifen does extend beyond 5 years. ?Thank you for consulting [place] in the care of this patient.  Please do not hesitate to contact us with any additional questions or concerns. ? ?Breast cancer surveillance: Annual mammograms, consider breast MRI because a lifetime risk of breast cancer risk of more than 20% along with breast exams. ? ?HISTORY OF PRESENTING ILLNESS:  ?Kathryn [name] 66 y.o. female is here because of [place]. ? ?Marsi noted a discharge from her right nipple approximately December 2016. Sometime in November 2017 it became more bloody in appearance. Note that the patient had not had a mammogram for more than 10 years. ?  ?She was referred to the breast Center where on 11/02/2016  she underwent bilateral diagnostic mammography with tomography and right breast ultrasonography. The breast density was category B. In the retroareolar right breast there was a discrete circumscribed oval nodule. There was also mild duct ectasia on the right behind the nipple. There were nosuspicious calcificationsin either breast in the left breast was unremarkable. On physical exam there was a small amount of clear discharge elicited from the right nipple. Ultrasound showed a dilated duct in the right breast 12:00 position as well as an intraductal mass, and a second intraductal mass in a dilated duct at the & oclock position> ? ?Biopsy of these masses was obtained 11/04/2016, and this showed ([id number]) intraductal papilloma at 7:00 and atypical lobular hyperplasia with papilloma at the 12:00 position. ?  ?Accordingly on 12/15/2016 the patient proceeded to right lumpectomy ?2, the more lateral sample showing ductal papilloma, with no evidence of malignancy, and the right retroareolar sample showing ductal papilloma with focal atypical lobular hyperplasia. ? ?She was started on prophylactic tamoxifen March 2018.  She has been tolerating tamoxifen well.  She has not followed up with Dr. Pablo Ledger in a few years.  She is reestablishing with high risk breast clinic for follow-up and additional recommendations.  She denies any new health complaints.  She sometimes checks her breasts and denies any reportable masses except for some lumpiness in the left breast in the upper outer quadrant.  No nipple changes.  She denies any family history of breast cancer. ?Rest of the pertinent 10 point ROS pertinent for some back pain and muscle spasms, otherwise unremarkable. ? ?She has 2 daughters, she gave birth to her first daughter at the age of 64.  She did not breast-feed.  No long-term  use of birth control or hormone replacement therapy.  No family history of breast ovarian, pancreatic cancer, melanoma's. ? ?Interval  history ?She has now completed 5 years of tamoxifen.  She is quite excited that she can discontinue the medicine.  She overall tolerated this very well.  She denies any new breast changes.  Rest of the pertinent 10 point ROS reviewed and negative ? ?MEDICAL HISTORY:  ?Past Medical History:  ?Diagnosis Date  ? Anxiety   ? Breast cancer ([place])   ? Cancer ([place]) 10/2016  ? Breast right,  lumpectomy,  did not require chemo/xrt  ? DDD (degenerative disc disease), cervical   ? Depression   ? Dyspnea   ? Dysrhythmia   ? A Fib  ? Elevated cholesterol   ? GERD (gastroesophageal reflux disease)   ? H/O hiatal hernia   ? Hypertension   ? Hypothyroidism   ? Paroxysmal atrial fibrillation ([place]) 11/20/2020  ? Tremor   ? patient states "tremor has gotten better" on 06/30/19  ? Wears partial dentures   ? upper  ? ? ?SURGICAL HISTORY: ?Past Surgical History:  ?Procedure Laterality Date  ? ABDOMINAL HYSTERECTOMY  yrs ago  ? ANTERIOR LAT LUMBAR FUSION N/A 11/21/2019  ? Procedure: Lumbar two-three Lumbar three-four Anterolateral lumbar interbody fusion with lateral plate;  Surgeon: Kristeen Miss, MD;  Location: [facility];  Service: Neurosurgery;  Laterality: N/A;  ? BREAST EXCISIONAL BIOPSY Right 11/2016  ? ALH and papillomas  ? BREAST LUMPECTOMY Right 2017  ? BREAST LUMPECTOMY WITH RADIOACTIVE SEED LOCALIZATION Right 12/15/2016  ? Procedure: RADIOACTIVE SEED X 2 GUIDED EXCISIONAL BREAST BIOPSY;  Surgeon: Rolm Bookbinder, MD;  Location: [facility];  Service: General;  Laterality: Right;  ? CARPAL TUNNEL RELEASE Bilateral yrs ago  ? x 2  ? cervical neck fusion  yrs ago  ? C 3 and C 4   ? COLONOSCOPY    ? ESOPHAGOGASTRODUODENOSCOPY ENDOSCOPY N/A 01/09/2022  ? Procedure: INTRAOPERATIVE ENDOSCOPY;  Surgeon: Ralene Ok, MD;  Location: [facility];  Service: General;  Laterality: N/A;  ? EYE SURGERY Bilateral   ? Cataracts removed  ? left knee arthroscopy   last done 2008  ? x 3  ? left shoulder arthroscopy  yrs ago  ? lower back  surgery  2005  ? right total knee replacement   2008  ? TOTAL KNEE ARTHROPLASTY  01/14/2012  ? Procedure: TOTAL KNEE ARTHROPLASTY;  Surgeon: Johnn Hai, MD;  [facility];  Service: Orthopedics;  Laterality: Left;  preop femoral nerve block  ? UPPER GI ENDOSCOPY    ? WISDOM TOOTH EXTRACTION    ? ? ?SOCIAL HISTORY: ?Social History  ? ?Socioeconomic History  ? Marital status: Married  ?  Spouse name: Not on file  ? Number of children: Not on file  ? Years of education: Not on file  ? Highest education level: Not on file  ?Occupational History  ? Not on file  ?Tobacco Use  ? Smoking status: Never  ? Smokeless tobacco: Never  ?Vaping Use  ? Vaping Use: Never used  ?Substance and Sexual Activity  ? Alcohol use: Yes  ?  Comment: occasional wine  ? Drug use: No  ? Sexual activity: Not on file  ?  Comment: Hysterectormy  ?Other Topics Concern  ? Not on file  ?Social History Narrative  ? Lives in [place] with spouse.  ? Retired Nurse, learning disability  ? ?Social Determinants of Health  ? ?Financial Resource Strain: Not  on file  ?Food Insecurity: Not on file  ?Transportation Needs: Not on file  ?Physical Activity: Not on file  ?Stress: Not on file  ?Social Connections: Not on file  ?Intimate Partner Violence: Not on file  ? ? ?FAMILY HISTORY: ?Family History  ?Problem Relation Age of Onset  ? Breast cancer Neg Hx   ? ? ?ALLERGIES:  is allergic to coumadin [warfarin]. ? ?MEDICATIONS:  ?Current Outpatient Medications  ?Medication Sig Dispense Refill  ? ALPRAZolam (XANAX) 0.25 MG tablet Take 0.25 mg by mouth at bedtime.    ? Cholecalciferol (VITAMIN D3) 25 MCG (1000 UT) CAPS Take 1,000 Units by mouth daily.    ? colchicine 0.6 MG tablet Take 0.6 mg by mouth as needed.    ? Cyanocobalamin (VITAMIN B12 PO) Take 1 tablet by mouth every morning.    ? diltiazem (CARDIZEM) 30 MG tablet Take 1 tablet every 4 hours AS NEEDED for heart rate >100 as long as blood pressure >100. 30 tablet 1  ? DULoxetine (CYMBALTA) 30 MG  capsule Take 30 mg by mouth See admin instructions. Take with 60 mg to equal 90 mg at night    ? DULoxetine (CYMBALTA) 60 MG capsule Take 60 mg by mouth See admin instructions. Take with 30 mg to equal 90 mg at ni

## 2022-04-06 ENCOUNTER — Telehealth: Payer: Self-pay | Admitting: Hematology and Oncology

## 2022-04-06 NOTE — Telephone Encounter (Signed)
Scheduled appointment per 05/12 los. Patient aware.  ?

## 2022-04-07 DIAGNOSIS — L821 Other seborrheic keratosis: Secondary | ICD-10-CM | POA: Diagnosis not present

## 2022-04-08 ENCOUNTER — Other Ambulatory Visit (HOSPITAL_COMMUNITY): Payer: Self-pay | Admitting: Nurse Practitioner

## 2022-04-08 DIAGNOSIS — G894 Chronic pain syndrome: Secondary | ICD-10-CM | POA: Diagnosis not present

## 2022-04-08 DIAGNOSIS — F1120 Opioid dependence, uncomplicated: Secondary | ICD-10-CM | POA: Diagnosis not present

## 2022-04-08 DIAGNOSIS — M5416 Radiculopathy, lumbar region: Secondary | ICD-10-CM | POA: Diagnosis not present

## 2022-04-09 ENCOUNTER — Telehealth: Payer: Self-pay

## 2022-04-09 DIAGNOSIS — I700 Atherosclerosis of aorta: Secondary | ICD-10-CM

## 2022-04-09 NOTE — Telephone Encounter (Signed)
Patient with diagnosis of afib on Xarelto for anticoagulation.    Procedure: lumbar spine injection Date of procedure: TBD  CHA2DS2-VASc Score = 4  This indicates a 4.8% annual risk of stroke. The patient's score is based upon: CHF History: 0 HTN History: 1 Diabetes History: 0 Stroke History: 0 Vascular Disease History: 1 Age Score: 1 Gender Score: 1   Aortic atherosclerosis noted on lumbar CT 12/01/21, PMH has been updated.  CrCl 38m/min using adjusted body weight due to obesity Platelet count 291K  Per office protocol, patient can hold Xarelto for 3 days prior to procedure.

## 2022-04-09 NOTE — Telephone Encounter (Signed)
   Pre-operative Risk Assessment    Patient Name: Kathryn [name]  DOB: 02-27-1956 MRN: [id number]      Request for Surgical Clearance    Procedure:   Lumbar Spine Injection  Date of Surgery:  Clearance TBD                                 Surgeon:  Lenord Carbo, MD Surgeon's Group or Practice Name:  [facility] Phone number:  [phone] Fax number:  [phone]   Type of Clearance Requested:   - Medical  - Pharmacy:  Hold Rivaroxaban (Xarelto) x 3 days   Type of Anesthesia:  Not Indicated   Additional requests/questions:   None  Signed, Roseline Ebarb   04/09/2022, 10:19 AM

## 2022-04-10 NOTE — Telephone Encounter (Signed)
    Patient Name: Kathryn [name]  DOB: [birth year] MRN: [id number]  Primary Cardiologist: Thompson Grayer, MD / [facility]  Chart reviewed as part of pre-operative protocol coverage. Last OV with [facility] 03/17/22. Spoke to patient who affirms she has been doing well without any new cardiac symptoms. Given past medical history and time since last visit, based on ACC/AHA guidelines, [name] would be at acceptable risk for the planned procedure without further cardiovascular testing.   The patient was advised that if she develops new symptoms prior to surgery to contact our office to arrange for a follow-up visit, and she verbalized understanding.  Per pharmD, patient can hold Xarelto for 3 days prior to procedure.    I will route this recommendation to the requesting party via Epic fax function and remove from pre-op pool.  Please call with questions.  Charlie Pitter, PA-C 04/10/2022, 9:36 AM

## 2022-04-23 DIAGNOSIS — M5416 Radiculopathy, lumbar region: Secondary | ICD-10-CM | POA: Diagnosis not present

## 2022-04-28 ENCOUNTER — Telehealth (HOSPITAL_COMMUNITY): Payer: Self-pay | Admitting: *Deleted

## 2022-04-28 MED ORDER — HYDROCHLOROTHIAZIDE 25 MG PO TABS: 25.0000 mg | ORAL_TABLET | Freq: Every day | ORAL | 11 refills | Status: DC

## 2022-04-28 MED ORDER — FUROSEMIDE 20 MG PO TABS: ORAL_TABLET | ORAL | 0 refills | Status: DC

## 2022-04-28 NOTE — Telephone Encounter (Signed)
Pt called in stating since her back injection last week she is having increased lower extremity swelling. Pt contacted the MD that performed back injection but was instructed to call our office for management. Discussed with Adline Peals PA will use PRN lasix '20mg'$  daily for the next 3 days. Pt to call with update on Friday. If swelling persists will bring in for assessment.

## 2022-05-13 DIAGNOSIS — G894 Chronic pain syndrome: Secondary | ICD-10-CM | POA: Diagnosis not present

## 2022-05-13 DIAGNOSIS — Z6835 Body mass index (BMI) 35.0-35.9, adult: Secondary | ICD-10-CM | POA: Diagnosis not present

## 2022-05-13 DIAGNOSIS — F1120 Opioid dependence, uncomplicated: Secondary | ICD-10-CM | POA: Diagnosis not present

## 2022-05-13 DIAGNOSIS — M5416 Radiculopathy, lumbar region: Secondary | ICD-10-CM | POA: Diagnosis not present

## 2022-05-14 ENCOUNTER — Other Ambulatory Visit (HOSPITAL_COMMUNITY): Payer: Self-pay | Admitting: Physician Assistant

## 2022-05-14 ENCOUNTER — Other Ambulatory Visit: Payer: Self-pay | Admitting: Neurosurgery

## 2022-05-14 DIAGNOSIS — M5416 Radiculopathy, lumbar region: Secondary | ICD-10-CM

## 2022-05-19 ENCOUNTER — Other Ambulatory Visit (HOSPITAL_COMMUNITY): Payer: Self-pay | Admitting: *Deleted

## 2022-05-19 MED ORDER — FUROSEMIDE 20 MG PO TABS
20.0000 mg | ORAL_TABLET | Freq: Every day | ORAL | 1 refills | Status: DC | PRN
Start: 2022-05-19 — End: 2023-02-17

## 2022-05-21 ENCOUNTER — Encounter (HOSPITAL_BASED_OUTPATIENT_CLINIC_OR_DEPARTMENT_OTHER): Payer: Self-pay | Admitting: Urology

## 2022-05-21 ENCOUNTER — Emergency Department (HOSPITAL_BASED_OUTPATIENT_CLINIC_OR_DEPARTMENT_OTHER)
Admission: EM | Admit: 2022-05-21 | Discharge: 2022-05-21 | Disposition: A | Discharge status: Home / Self Care | Payer: Medicare Other | Attending: Emergency Medicine | Admitting: Emergency Medicine

## 2022-05-21 ENCOUNTER — Other Ambulatory Visit: Payer: Self-pay

## 2022-05-21 DIAGNOSIS — T148XXA Other injury of unspecified body region, initial encounter: Secondary | ICD-10-CM

## 2022-05-21 DIAGNOSIS — R600 Localized edema: Secondary | ICD-10-CM | POA: Diagnosis not present

## 2022-05-21 DIAGNOSIS — R000 Tachycardia, unspecified: Secondary | ICD-10-CM | POA: Diagnosis not present

## 2022-05-21 DIAGNOSIS — S90421A Blister (nonthermal), right great toe, initial encounter: Secondary | ICD-10-CM | POA: Insufficient documentation

## 2022-05-21 DIAGNOSIS — X58XXXA Exposure to other specified factors, initial encounter: Secondary | ICD-10-CM | POA: Diagnosis not present

## 2022-05-21 DIAGNOSIS — S90111A Contusion of right great toe without damage to nail, initial encounter: Secondary | ICD-10-CM | POA: Insufficient documentation

## 2022-05-21 MED ORDER — SILVER SULFADIAZINE 1 % EX CREA: TOPICAL_CREAM | Freq: Once | CUTANEOUS | Status: AC

## 2022-05-21 NOTE — Discharge Instructions (Addendum)
Apply Silvadene daily   Clean and dressings daily   Keep wound clean and dry   Please follow-up with your established podiatrist or the 1 provided for wound care of right great toe

## 2022-05-21 NOTE — ED Triage Notes (Signed)
Right great toe wound noted, bruising and drainage noted, small wound to right 3rd toe as well Pt states it started as a blister approx   weeks ago Pt states worsening feet swelling over past 2 weeks  Pt in pain management for chronic pain

## 2022-05-21 NOTE — ED Provider Notes (Signed)
[facility] Provider Note   CSN: [id number] Arrival date & time: 05/21/22  1317     History  Chief Complaint  Patient presents with   Foot Wound    Kathryn [name] is a 66 y.o. female.  Patient is a 66 year old female presenting for bruising and blister on right great toe.  Patient states over the last 2 weeks she has had significant worsening of her chronic swelling in bilateral lower extremities.  States PCP restarted her on Lasix daily.  Admits to significant improvement of swelling, however, has several blisters on the right lower extremity after swelling.  Patient admits to redness and discoloration of the right great toe.  Otherwise admits to minimal pain.  Admits to clear-yellow drainage from blister.  Denies any fevers or chills.  The history is provided by the patient. No language interpreter was used.       Home Medications Prior to Admission medications   Medication Sig Start Date End Date Taking? Authorizing Provider  ALPRAZolam (XANAX) 0.25 MG tablet Take 0.25 mg by mouth at bedtime. 12/31/14   [provider]  Cholecalciferol (VITAMIN D3) 25 MCG (1000 UT) CAPS Take 1,000 Units by mouth daily.    [provider]  colchicine 0.6 MG tablet Take 0.6 mg by mouth as needed. 01/02/22   [provider]  Cyanocobalamin (VITAMIN B12 PO) Take 1 tablet by mouth every morning.    [provider]  diltiazem (CARDIZEM) 30 MG tablet Take 1 tablet every 4 hours AS NEEDED for heart rate >100 as long as blood pressure >100. 01/16/21   Sherran Needs, NP  DULoxetine (CYMBALTA) 30 MG capsule Take 30 mg by mouth See admin instructions. Take with 60 mg to equal 90 mg at night    [provider]  DULoxetine (CYMBALTA) 60 MG capsule Take 60 mg by mouth See admin instructions. Take with 30 mg to equal 90 mg at night    [provider]  furosemide (LASIX) 20 MG tablet Take 1 tablet (20 mg total) by mouth daily as  needed. 05/19/22   Sherran Needs, NP  gabapentin (NEURONTIN) 300 MG capsule Take 1 capsule by mouth See admin instructions. Take 300 mg twice daily, may take a third 300 mg dose as needed for pain 04/11/21   [provider]  hydrochlorothiazide (HYDRODIURIL) 25 MG tablet Take 1 tablet (25 mg total) by mouth daily. 04/28/22   Fenton, Clint R, PA  levothyroxine (SYNTHROID) 200 MCG tablet Take 200 mcg by mouth daily before breakfast.    [provider]  methocarbamol (ROBAXIN) 500 MG tablet Take 1 tablet (500 mg total) by mouth every 6 (six) hours as needed for muscle spasms. 03/28/21   Kristeen Miss, MD  metoprolol succinate (TOPROL-XL) 100 MG 24 hr tablet TAKE 1 TABLET BY MOUTH DAILY WITH OR IMMEDIATELY FOLLOWING A MEAL 02/20/22   Sherran Needs, NP  omeprazole (PRILOSEC) 20 MG capsule Take 20 mg by mouth 2 (two) times daily. 02/20/22   [provider]  oxyCODONE-acetaminophen (PERCOCET/ROXICET) 5-325 MG tablet Take 1-2 tablets by mouth every 4 (four) hours as needed for moderate pain or severe pain. Patient taking differently: Take 1-2 tablets by mouth 3 (three) times daily as needed for moderate pain or severe pain. 03/28/21   Kristeen Miss, MD  rivaroxaban (XARELTO) 20 MG TABS tablet Take 1 tablet (20 mg total) by mouth daily with supper. 03/17/22   Sherran Needs, NP  tamoxifen (NOLVADEX) 20  MG tablet TAKE 1 TABLET BY MOUTH DAILY 03/02/22   Benay Pike, MD      Allergies    Coumadin [warfarin]    Review of Systems   Review of Systems  Constitutional:  Negative for chills and fever.  Skin:  Positive for color change and wound.  Neurological:  Negative for weakness and numbness.    Physical Exam Updated Vital Signs BP 138/80 (BP Location: Right Arm)   Pulse 85   Temp 97.8 F (36.6 C) (Oral)   Resp 18   Ht '5\' 6"'$  (1.676 m)   Wt 90.7 kg   LMP  (LMP Unknown)   SpO2 100%   BMI 32.28 kg/m  Physical Exam Vitals and nursing note reviewed.  Constitutional:       Appearance: Normal appearance.  HENT:     Head: Normocephalic and atraumatic.  Cardiovascular:     Rate and Rhythm: Tachycardia present.     Pulses: Normal pulses.          Dorsalis pedis pulses are 2+ on the right side and 2+ on the left side.  Pulmonary:     Effort: Pulmonary effort is normal. No respiratory distress.  Skin:    Capillary Refill: Capillary refill takes less than 2 seconds.       Neurological:     General: No focal deficit present.     Mental Status: She is alert.     GCS: GCS eye subscore is 4. GCS verbal subscore is 5. GCS motor subscore is 6.     Cranial Nerves: Cranial nerves 2-12 are intact.     Sensory: Sensation is intact.     ED Results / Procedures / Treatments   Labs (all labs ordered are listed, but only abnormal results are displayed) Labs Reviewed - No data to display  EKG None  Radiology No results found.  Procedures Procedures    Medications Ordered in ED Medications  silver sulfADIAZINE (SILVADENE) 1 % cream (has no administration in time range)    ED Course/ Medical Decision Making/ A&P                           Medical Decision Making  1:62 PM 66 year old female presenting for blister on right great toe after bilateral leg swelling.  Patient is alert and oriented x3, no acute distress, afebrile, stable vital signs.  Physical exam demonstrates Hematoma present on right great toe with blister with skin removed on posterior aspect of right great toe.  Clear drainage present.  No warmth, swelling, erythema, or tenderness.  Low concern for cellulitis.  Patient given Silvadene for skin barrier for blister.  Given nonadherent gauze and sterile gauze tubing with recommendations to keep wound clean and dry.  Recommended for follow-up with podiatry.  Patient in no distress and overall condition improved here in the ED. Detailed discussions were had with the patient regarding current findings, and need for close f/u with PCP or on call  doctor. The patient has been instructed to return immediately if the symptoms worsen in any way for re-evaluation. Patient verbalized understanding and is in agreement with current care plan. All questions answered prior to discharge.         Final Clinical Impression(s) / ED Diagnoses Final diagnoses:  Hematoma  Blister due to acute edema    Rx / DC Orders ED Discharge Orders     None         Lianne Cure,  DO 05/21/22 1351

## 2022-05-21 NOTE — ED Notes (Signed)
ED Provider at bedside. 

## 2022-05-23 IMAGING — DX DG CHEST 2V
2 series · 2 of 2 positions shown · non-contrast
Comparison: PA and lateral chest 01/05/2012.

CLINICAL DATA: Tachycardia and shortness of breath.

EXAM:
CHEST - 2 VIEW

[chest lat]
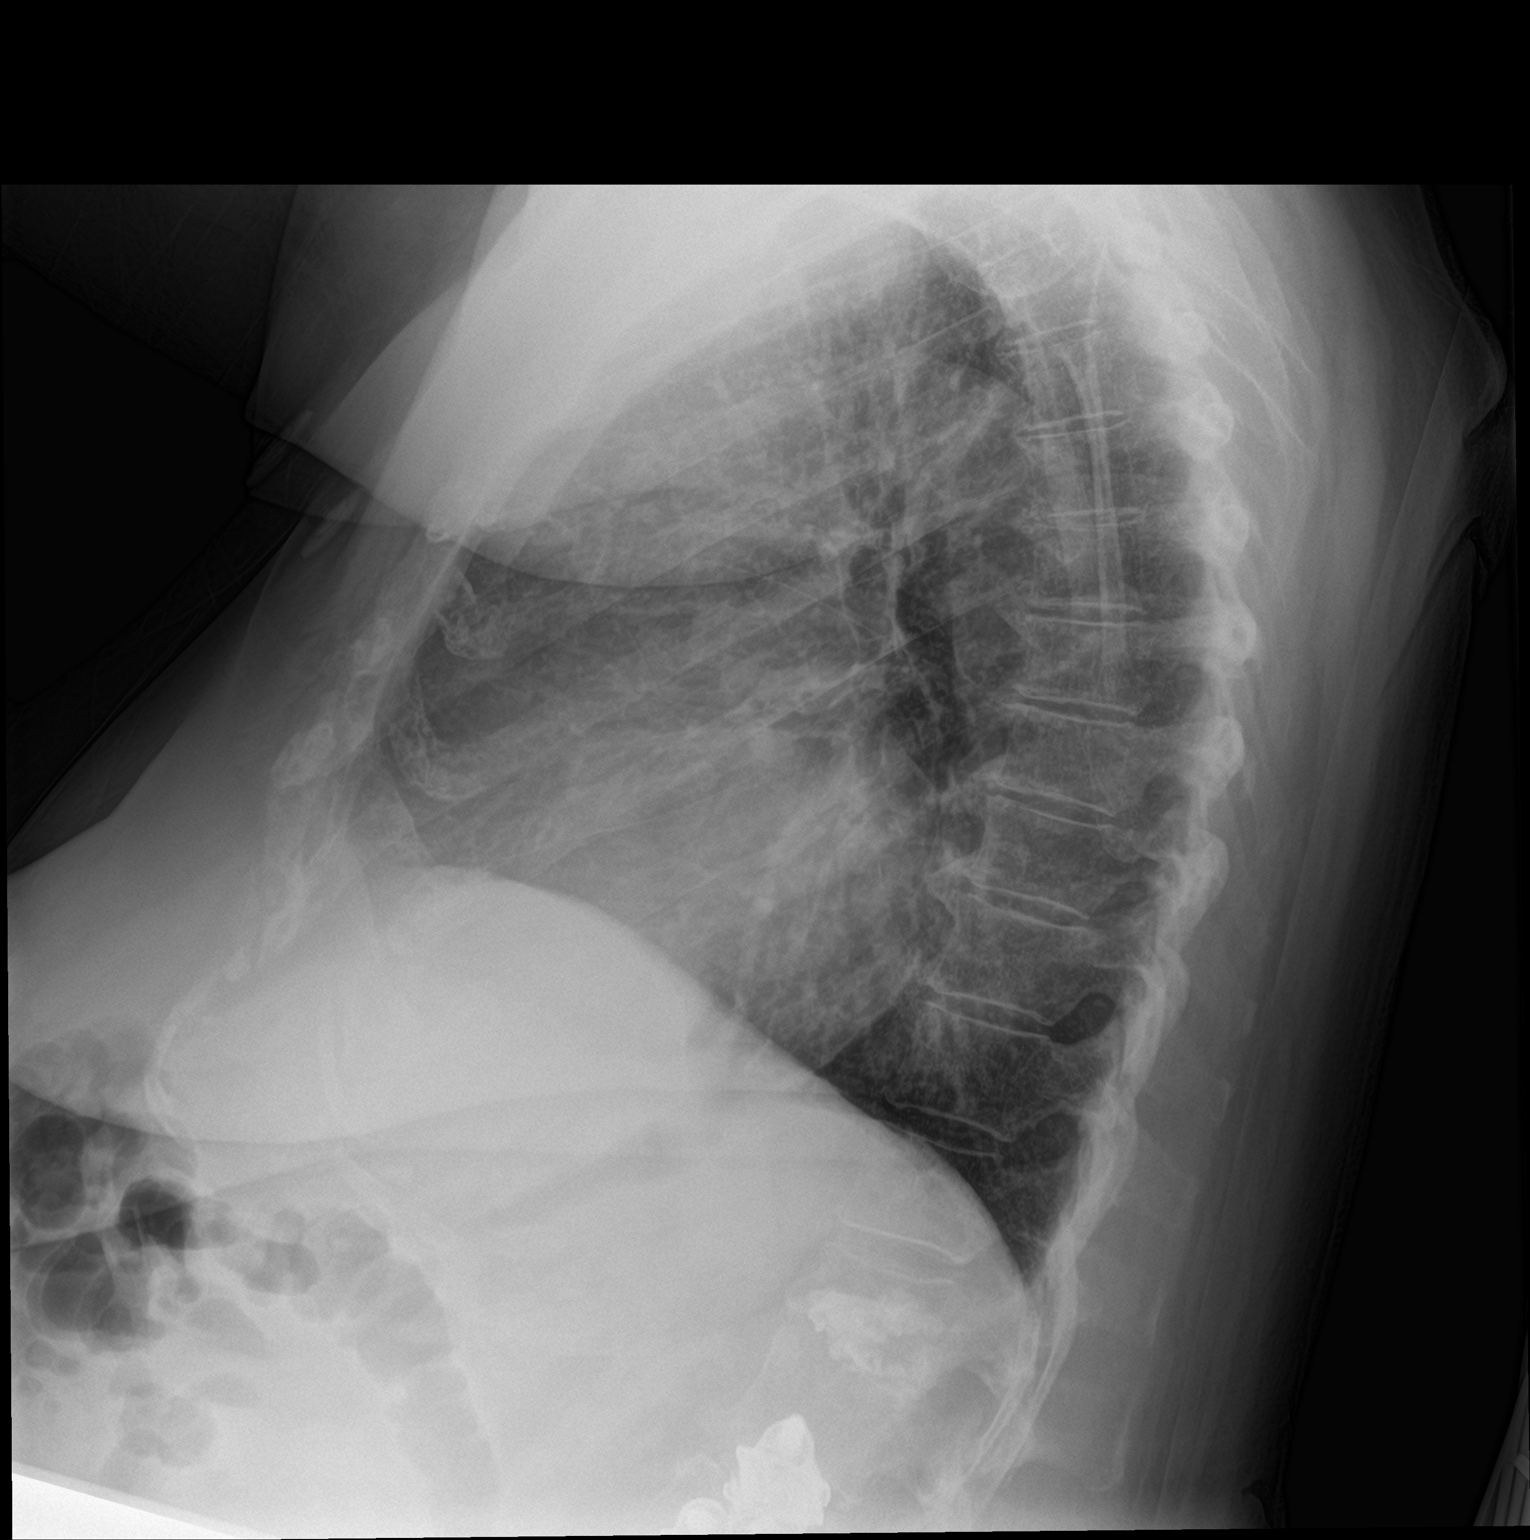

[chest ap]
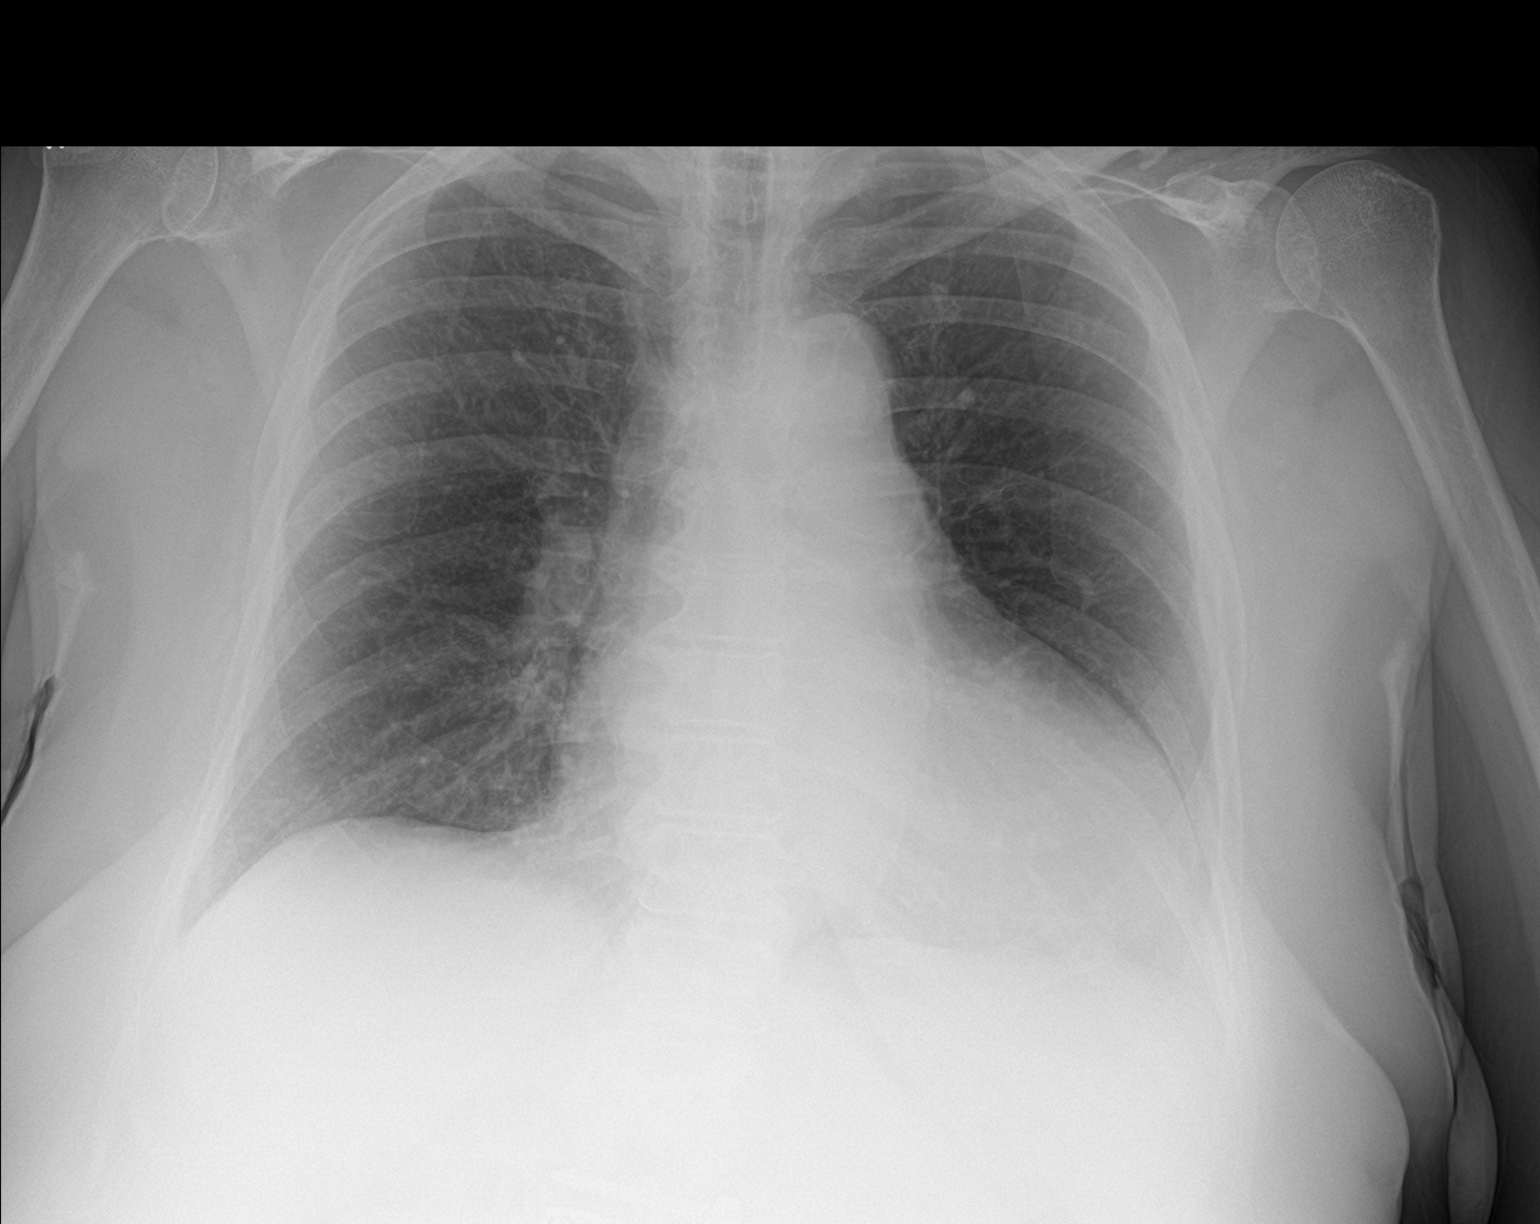

[2 of 2 positions shown; findings below may reference images not displayed]

FINDINGS: Cardiomegaly without edema. Lungs clear. No pneumothorax or pleural
fluid. Aortic atherosclerosis. No acute bony abnormality. The
patient is status post L1 vertebral augmentation. There is partial
visualization of lumbar fusion hardware.
IMPRESSION: No acute disease.

Cardiomegaly.

Aortic Atherosclerosis (QCPGC-GCN.N).

## 2022-05-27 ENCOUNTER — Ambulatory Visit: Payer: Medicare Other | Admitting: Podiatry

## 2022-05-28 ENCOUNTER — Other Ambulatory Visit (HOSPITAL_COMMUNITY): Payer: Self-pay | Admitting: Nurse Practitioner

## 2022-05-29 ENCOUNTER — Ambulatory Visit
Admission: RE | Admit: 2022-05-29 | Discharge: 2022-05-29 | Disposition: A | Discharge status: Home / Self Care | Payer: Medicare Other | Source: Ambulatory Visit | Attending: Neurosurgery | Admitting: Neurosurgery

## 2022-05-29 DIAGNOSIS — M5126 Other intervertebral disc displacement, lumbar region: Secondary | ICD-10-CM | POA: Diagnosis not present

## 2022-05-29 DIAGNOSIS — R200 Anesthesia of skin: Secondary | ICD-10-CM | POA: Diagnosis not present

## 2022-05-29 DIAGNOSIS — R531 Weakness: Secondary | ICD-10-CM | POA: Diagnosis not present

## 2022-05-29 DIAGNOSIS — M5416 Radiculopathy, lumbar region: Secondary | ICD-10-CM

## 2022-05-29 DIAGNOSIS — Z853 Personal history of malignant neoplasm of breast: Secondary | ICD-10-CM | POA: Diagnosis not present

## 2022-05-29 MED ORDER — GADOBENATE DIMEGLUMINE 529 MG/ML IV SOLN
20.0000 mL | Freq: Once | INTRAVENOUS | Status: AC | PRN
  Administered 2022-05-29: 20 mL via INTRAVENOUS

## 2022-06-08 ENCOUNTER — Ambulatory Visit (INDEPENDENT_AMBULATORY_CARE_PROVIDER_SITE_OTHER): Payer: Medicare Other

## 2022-06-08 ENCOUNTER — Ambulatory Visit: Payer: Medicare Other | Admitting: Podiatry

## 2022-06-08 ENCOUNTER — Encounter: Payer: Self-pay | Admitting: Podiatry

## 2022-06-08 DIAGNOSIS — D689 Coagulation defect, unspecified: Secondary | ICD-10-CM | POA: Diagnosis not present

## 2022-06-08 DIAGNOSIS — T148XXA Other injury of unspecified body region, initial encounter: Secondary | ICD-10-CM

## 2022-06-08 DIAGNOSIS — M779 Enthesopathy, unspecified: Secondary | ICD-10-CM

## 2022-06-08 NOTE — Progress Notes (Signed)
Subjective:   Patient ID: Kathryn [name], female   DOB: 66 y.o.   MRN: [id number]   HPI Patient presents with a significant blister on the fifth digit left which then started to bleed profusely with patient on blood thinner currently and is also taking fluid medicine.  Had this occur on the right 1 a little while ago.  No other changes in health history no other conditions noted   ROS      Objective:  Physical Exam  Neurovascular status intact muscle strength was adequate range of motion adequate I did not note bruising on other parts of her body but I did note there is fresh blood in the left fifth digit with a blister which formed on the distal portion of the toe localized no proximal edema erythema noted.     Assessment:  Possibility for metabolic condition or some kind of a problem with platelet aggregation or other issue along with some form of trauma which may have occurred     Plan:  H&P discussed and explained condition and at this point I have recommended blood work to rule out any kind of pathology within her blood system.  I debrided the area I applied compression dressing to try to help I reviewed x-ray and will be seen back if symptoms persist  X-rays indicate that there is some irritated tissue distally but I did not note any significant other pathology

## 2022-06-09 LAB — COMPREHENSIVE METABOLIC PANEL
AG Ratio: 1.5 (calc) (ref 1.0–2.5)
ALT: 8 U/L (ref 6–29)
AST: 12 U/L (ref 10–35)
Albumin: 3.9 g/dL (ref 3.6–5.1)
Alkaline phosphatase (APISO): 82 U/L (ref 37–153)
BUN/Creatinine Ratio: 14 (calc) (ref 6–22)
BUN: 16 mg/dL (ref 7–25)
CO2: 31 mmol/L (ref 20–32)
Calcium: 9.4 mg/dL (ref 8.6–10.4)
Chloride: 94 mmol/L — ABNORMAL LOW (ref 98–110)
Creat: 1.14 mg/dL — ABNORMAL HIGH (ref 0.50–1.05)
Globulin: 2.6 g/dL (calc) (ref 1.9–3.7)
Glucose, Bld: 74 mg/dL (ref 65–99)
Potassium: 3.8 mmol/L (ref 3.5–5.3)
Sodium: 138 mmol/L (ref 135–146)
Total Bilirubin: 0.2 mg/dL (ref 0.2–1.2)
Total Protein: 6.5 g/dL (ref 6.1–8.1)

## 2022-06-09 LAB — CBC WITH DIFFERENTIAL/PLATELET
Absolute Monocytes: 670 cells/uL (ref 200–950)
Basophils Absolute: 31 cells/uL (ref 0–200)
Basophils Relative: 0.4 %
Eosinophils Absolute: 816 cells/uL — ABNORMAL HIGH (ref 15–500)
Eosinophils Relative: 10.6 %
HCT: 28.5 % — ABNORMAL LOW (ref 35.0–45.0)
Hemoglobin: 8.6 g/dL — ABNORMAL LOW (ref 11.7–15.5)
Lymphs Abs: 1686 cells/uL (ref 850–3900)
MCH: 24.7 pg — ABNORMAL LOW (ref 27.0–33.0)
MCHC: 30.2 g/dL — ABNORMAL LOW (ref 32.0–36.0)
MCV: 81.9 fL (ref 80.0–100.0)
MPV: 10.7 fL (ref 7.5–12.5)
Monocytes Relative: 8.7 %
Neutro Abs: 4497 cells/uL (ref 1500–7800)
Neutrophils Relative %: 58.4 %
Platelets: 306 10*3/uL (ref 140–400)
RBC: 3.48 10*6/uL — ABNORMAL LOW (ref 3.80–5.10)
RDW: 17.4 % — ABNORMAL HIGH (ref 11.0–15.0)
Total Lymphocyte: 21.9 %
WBC: 7.7 10*3/uL (ref 3.8–10.8)

## 2022-06-11 ENCOUNTER — Telehealth: Payer: Self-pay | Admitting: *Deleted

## 2022-06-11 NOTE — Telephone Encounter (Signed)
Rbc were low and hemoglobin but I think not different then previous testing over the last several years

## 2022-06-11 NOTE — Telephone Encounter (Signed)
Patient is requesting lab results from 06/08/22. Please advise.

## 2022-06-16 NOTE — Telephone Encounter (Signed)
Called patient to give lab results per physician, no answer, left voice message to call back.

## 2022-06-16 NOTE — Progress Notes (Signed)
COLLIE, KITTEL ([id number]) Visit Report for 06/17/2022 Allergy List Details Patient Name: Date of Service: [name]. 06/17/2022 8:00 A M Medical Record Number: [id number] Patient Account Number: [id number] Date of Birth/Sex: Treating RN: December 18, 1955 (66 y.o. [name] Primary Care Amantha Sklar: [name] Other Clinician: Referring Lauris Keepers: Treating Tressy Kunzman/Extender: Georgette Shell in Treatment: 0 Allergies Active Allergies warfarin Allergy Notes Electronic Signature(s) Signed: 06/17/2022 4:44:39 PM By: [name] Previous Signature: 06/16/2022 11:25:01 AM Version By: [name] Entered By: [name] on 06/17/2022 08:09:40 -------------------------------------------------------------------------------- Arrival Information Details Patient Name: Date of Service: [name], Martin Majestic RO [name]. 06/17/2022 8:00 A M Medical Record Number: [id number] Patient Account Number: [id number] Date of Birth/Sex: Treating RN: [birth year] (66 y.o. [name] Primary Care Ravinder Hofland: [name] Other Clinician: Referring Landen Breeland: Treating Rhylan Gross/Extender: Georgette Shell in Treatment: 0 Visit Information Patient Arrived: Lyndel Pleasure Time: 08:02 Accompanied By: Husband Transfer Assistance: None Patient Identification Verified: Yes Secondary Verification Process Completed: Yes Patient Requires Transmission-Based Precautions: No Patient Has Alerts: Yes Patient Alerts: Patient on Blood Thinner Electronic Signature(s) Signed: 06/17/2022 4:44:39 PM By: [name] Entered By: [name] on 06/17/2022 08:06:51 -------------------------------------------------------------------------------- Clinic Level of Care Assessment Details Patient Name: Date of Service: [name], KA RO N W. 06/17/2022 8:00 A M Medical Record Number: [id number] Patient Account Number: [id number] Date of Birth/Sex: Treating RN: [birth year] (66  y.o. [name] Primary Care Nyrie Sigal: [name] Other Clinician: Referring Shravan Salahuddin: Treating Degan Hanser/Extender: Georgette Shell in Treatment: 0 Clinic Level of Care Assessment Items TOOL 2 Quantity Score X- 1 0 Use when only an EandM is performed on the INITIAL visit ASSESSMENTS - Nursing Assessment / Reassessment X- 1 20 General Physical Exam (combine w/ comprehensive assessment (listed just below) when performed on new pt. evals) X- 1 25 Comprehensive Assessment (HX, ROS, Risk Assessments, Wounds Hx, etc.) ASSESSMENTS - Wound and Skin A ssessment / Reassessment X - Simple Wound Assessment / Reassessment - one wound 1 5 '[]'$  - 0 Complex Wound Assessment / Reassessment - multiple wounds '[]'$  - 0 Dermatologic / Skin Assessment (not related to wound area) ASSESSMENTS - Ostomy and/or Continence Assessment and Care '[]'$  - 0 Incontinence Assessment and Management '[]'$  - 0 Ostomy Care Assessment and Management (repouching, etc.) PROCESS - Coordination of Care '[]'$  - 0 Simple Patient / Family Education for ongoing care X- 1 20 Complex (extensive) Patient / Family Education for ongoing care X- 1 10 Staff obtains Programmer, systems, Records, T Results / Process Orders est '[]'$  - 0 Staff telephones HHA, Nursing Homes / Clarify orders / etc '[]'$  - 0 Routine Transfer to another Facility (non-emergent condition) '[]'$  - 0 Routine Hospital Admission (non-emergent condition) '[]'$  - 0 New Admissions / Biomedical engineer / Ordering NPWT Apligraf, etc. , '[]'$  - 0 Emergency Hospital Admission (emergent condition) '[]'$  - 0 Simple Discharge Coordination '[]'$  - 0 Complex (extensive) Discharge Coordination PROCESS - Special Needs '[]'$  - 0 Pediatric / Minor Patient Management '[]'$  - 0 Isolation Patient Management '[]'$  - 0 Hearing / Language / Visual special needs '[]'$  - 0 Assessment of Community assistance (transportation, D/C planning, etc.) '[]'$  - 0 Additional assistance / Altered  mentation '[]'$  - 0 Support Surface(s) Assessment (bed, cushion, seat, etc.) INTERVENTIONS - Wound Cleansing / Measurement X- 1 5 Wound Imaging (photographs - any number of wounds) '[]'$  - 0 Wound Tracing (instead of photographs) X- 1 5 Simple Wound Measurement - one wound '[]'$  - 0 Complex  Wound Measurement - multiple wounds X- 1 5 Simple Wound Cleansing - one wound '[]'$  - 0 Complex Wound Cleansing - multiple wounds INTERVENTIONS - Wound Dressings X - Small Wound Dressing one or multiple wounds 1 10 '[]'$  - 0 Medium Wound Dressing one or multiple wounds '[]'$  - 0 Large Wound Dressing one or multiple wounds '[]'$  - 0 Application of Medications - injection INTERVENTIONS - Miscellaneous '[]'$  - 0 External ear exam '[]'$  - 0 Specimen Collection (cultures, biopsies, blood, body fluids, etc.) '[]'$  - 0 Specimen(s) / Culture(s) sent or taken to Lab for analysis '[]'$  - 0 Patient Transfer (multiple staff / Civil Service fast streamer / Similar devices) '[]'$  - 0 Simple Staple / Suture removal (25 or less) '[]'$  - 0 Complex Staple / Suture removal (26 or more) '[]'$  - 0 Hypo / Hyperglycemic Management (close monitor of Blood Glucose) X- 1 15 Ankle / Brachial Index (ABI) - do not check if billed separately Has the patient been seen at the hospital within the last three years: Yes Total Score: 120 Level Of Care: New/Established - Level 4 Electronic Signature(s) Signed: 06/17/2022 4:44:39 PM By: [name] Entered By: [name] on 06/17/2022 09:07:51 -------------------------------------------------------------------------------- Encounter Discharge Information Details Patient Name: Date of Service: [name], Martin Majestic RO [name]. 06/17/2022 8:00 A M Medical Record Number: [id number] Patient Account Number: [id number] Date of Birth/Sex: Treating RN: October 19, 1956 (66 y.o. [name] Primary Care Jansen Sciuto: [name] Other Clinician: Referring Kaziah Krizek: Treating Vernie Piet/Extender: Georgette Shell in  Treatment: 0 Encounter Discharge Information Items Discharge Condition: Stable Ambulatory Status: Cane Discharge Destination: Home Transportation: Private Auto Accompanied By: spouse Schedule Follow-up Appointment: Yes Clinical Summary of Care: Provided on 06/17/2022 Form Type Recipient Paper Patient Patient Electronic Signature(s) Signed: 06/17/2022 4:44:39 PM By: [name] Entered By: [name] on 06/17/2022 09:19:44 -------------------------------------------------------------------------------- Lower Extremity Assessment Details Patient Name: Date of Service: [name], Despina Pole. 06/17/2022 8:00 A M Medical Record Number: [id number] Patient Account Number: [id number] Date of Birth/Sex: Treating RN: [birth year] (66 y.o. [name] Primary Care Pascal Stiggers: [name] Other Clinician: Referring Camarie Mctigue: Treating Xzander Gilham/Extender: Georgette Shell in Treatment: 0 Edema Assessment Assessed: Shirlyn Goltz: Yes] Patrice Paradise: Yes] Edema: [Left: Yes] [Right: Yes] Calf Left: Right: Point of Measurement: 28 cm From Medial Instep 38 cm 39.5 cm Ankle Left: Right: Point of Measurement: 11 cm From Medial Instep 24.5 cm 35.4 cm Knee To Floor Left: Right: From Medial Instep 37 cm 37 cm Vascular Assessment Pulses: Dorsalis Pedis Palpable: [Left:Yes] [Right:Yes] Blood Pressure: Brachial: [Left:126] [Right:126] Ankle: [Left:Dorsalis Pedis: 140 1.11] [Right:Dorsalis Pedis: 138 1.10] Electronic Signature(s) Signed: 06/17/2022 4:44:39 PM By: [name] Entered By: [name] on 06/17/2022 08:43:36 -------------------------------------------------------------------------------- Multi Wound Chart Details Patient Name: Date of Service: [name], Martin Majestic RO [name]. 06/17/2022 8:00 A M Medical Record Number: [id number] Patient Account Number: [id number] Date of Birth/Sex: Treating RN: 09-17-1956 (66 y.o. [name] Primary Care Cougar Imel: [name] Other Clinician: Referring Addysen Louth: Treating Arminda Foglio/Extender: Georgette Shell in Treatment: 0 Vital Signs Height(in): 36 Pulse(bpm): 5 Weight(lbs): 212 Blood Pressure(mmHg): 126/73 Body Mass Index(BMI): 34.2 Temperature(F): 98.3 Respiratory Rate(breaths/min): 20 Photos: [N/A:N/A] Left T Fifth oe N/A N/A Wound Location: Blister N/A N/A Wounding Event: Neuropathic Ulcer-Non Diabetic N/A N/A Primary Etiology: Cataracts, Arrhythmia, Coronary N/A N/A Comorbid History: Artery Disease, Hypertension, Osteoarthritis, Neuropathy 05/26/2022 N/A N/A Date Acquired: 0 N/A N/A Weeks of Treatment: Open N/A N/A Wound Status: No N/A N/A Wound Recurrence: 2.5x1.5x0.1 N/A N/A Measurements  L x W x D (cm) 2.945 N/A N/A A (cm) : rea 0.295 N/A N/A Volume (cm) : 0.00% N/A N/A % Reduction in Area: 0.00% N/A N/A % Reduction in Volume: Full Thickness Without Exposed N/A N/A Classification: Support Structures Medium N/A N/A Exudate Amount: Sanguinous N/A N/A Exudate Type: red N/A N/A Exudate Color: Distinct, outline attached N/A N/A Wound Margin: Large (67-100%) N/A N/A Granulation Amount: Red N/A N/A Granulation Quality: None Present (0%) N/A N/A Necrotic Amount: Fat Layer (Subcutaneous Tissue): Yes N/A N/A Exposed Structures: Fascia: No Tendon: No Muscle: No Joint: No Bone: No None N/A N/A Epithelialization: maceration noted N/A N/A Assessment Notes: Treatment Notes Wound #1 (Toe Fifth) Wound Laterality: Left Cleanser Soap and Water Discharge Instruction: May shower and wash wound with dial antibacterial soap and water prior to dressing change. Peri-Wound Care Topical Primary Dressing KerraCel Ag Gelling Fiber Dressing, 2x2 in (silver alginate) Discharge Instruction: Apply silver alginate to wound bed as instructed Secondary Dressing Woven Gauze Sponges 2x2 in Discharge Instruction: Apply over primary dressing as  directed. Secured With 51M Medipore H Soft Cloth Surgical T ape, 4 x 10 (in/yd) Discharge Instruction: Secure with tape as directed. Compression Wrap Compression Stockings Add-Ons Electronic Signature(s) Signed: 06/17/2022 4:38:07 PM By: Linton Ham MD Signed: 06/17/2022 4:44:39 PM By: [name] Entered By: Linton Ham on 06/17/2022 09:29:49 -------------------------------------------------------------------------------- Multi-Disciplinary Care Plan Details Patient Name: Date of Service: [name], Martin Majestic RO [name]. 06/17/2022 8:00 A M Medical Record Number: [id number] Patient Account Number: [id number] Date of Birth/Sex: Treating RN: 20-Aug-1956 (66 y.o. [name] Primary Care Briyah Wheelwright: [name] Other Clinician: Referring Lisandro Meggett: Treating Ieesha Abbasi/Extender: Georgette Shell in Treatment: 0 Active Inactive Wound/Skin Impairment Nursing Diagnoses: Impaired tissue integrity Goals: Patient/caregiver will verbalize understanding of skin care regimen Date Initiated: 06/17/2022 Target Resolution Date: 07/15/2022 Goal Status: Active Ulcer/skin breakdown will have a volume reduction of 30% by week 4 Date Initiated: 06/17/2022 Target Resolution Date: 07/15/2022 Goal Status: Active Interventions: Assess patient/caregiver ability to obtain necessary supplies Assess patient/caregiver ability to perform ulcer/skin care regimen upon admission and as needed Assess ulceration(s) every visit Provide education on ulcer and skin care Treatment Activities: Topical wound management initiated : 06/17/2022 Notes: Electronic Signature(s) Signed: 06/17/2022 4:44:39 PM By: [name] Entered By: [name] on 06/17/2022 08:44:12 -------------------------------------------------------------------------------- Pain Assessment Details Patient Name: Date of Service: [name], Despina Pole. 06/17/2022 8:00 A M Medical Record Number: [id number] Patient Account  Number: [id number] Date of Birth/Sex: Treating RN: 11-06-1956 (66 y.o. [name] Primary Care [place] Throckmorton: [name] Other Clinician: Referring Matthew Pais: Treating Milena Liggett/Extender: Georgette Shell in Treatment: 0 Active Problems Location of Pain Severity and Description of Pain Patient Has Paino Yes Site Locations Pain Location: Pain in Ulcers With Dressing Change: Yes Duration of the Pain. Constant / Intermittento Intermittent Rate the pain. Current Pain Level: 3 Character of Pain Describe the Pain: Tender, Throbbing Pain Management and Medication Current Pain Management: Medication: Yes Cold Application: No Rest: Yes Massage: No Activity: No T.E.N.S.: No Heat Application: No Leg drop or elevation: No Is the Current Pain Management Adequate: Adequate How does your wound impact your activities of daily livingo Sleep: No Bathing: No Appetite: No Relationship With Others: No Bladder Continence: No Emotions: No Bowel Continence: No Work: No Toileting: No Drive: No Dressing: No Hobbies: No Electronic Signature(s) Signed: 06/17/2022 4:44:39 PM By: [name] Entered By: [name] on 06/17/2022 08:20:25 -------------------------------------------------------------------------------- Patient/Caregiver Education Details Patient Name: Date of Service:  WHITA KER, Despina Pole 7/26/2023andnbsp8:00 A M Medical Record Number: [id number] Patient Account Number: [id number] Date of Birth/Gender: Treating RN: 12-11-1955 (66 y.o. [name] Primary Care Physician: [name] Other Clinician: Referring Physician: Treating Physician/Extender: Georgette Shell in Treatment: 0 Education Assessment Education Provided To: Patient Education Topics Provided Wound/Skin Impairment: Methods: Explain/Verbal, Printed Responses: State content correctly Electronic Signature(s) Signed: 06/17/2022 4:44:39 PM By:  [name] Entered By: [name] on 06/17/2022 08:44:23 -------------------------------------------------------------------------------- Wound Assessment Details Patient Name: Date of Service: [name], Despina Pole. 06/17/2022 8:00 A M Medical Record Number: [id number] Patient Account Number: [id number] Date of Birth/Sex: Treating RN: 05/04/1956 (66 y.o. [name] Primary Care Konnor Vondrasek: [name] Other Clinician: Referring Samanatha Brammer: Treating Tziporah Knoke/Extender: Georgette Shell in Treatment: 0 Wound Status Wound Number: 1 Primary Neuropathic Ulcer-Non Diabetic Etiology: Wound Location: Left T Fifth oe Wound Open Wounding Event: Blister Status: Date Acquired: 05/26/2022 Comorbid Cataracts, Arrhythmia, Coronary Artery Disease, Hypertension, Weeks Of Treatment: 0 History: Osteoarthritis, Neuropathy Clustered Wound: No Photos Wound Measurements Length: (cm) 2.5 Width: (cm) 1.5 Depth: (cm) 0.1 Area: (cm) 2.945 Volume: (cm) 0.295 % Reduction in Area: 0% % Reduction in Volume: 0% Epithelialization: None Tunneling: No Undermining: No Wound Description Classification: Full Thickness Without Exposed Support Structures Wound Margin: Distinct, outline attached Exudate Amount: Medium Exudate Type: Sanguinous Exudate Color: red Foul Odor After Cleansing: No Slough/Fibrino No Wound Bed Granulation Amount: Large (67-100%) Exposed Structure Granulation Quality: Red Fascia Exposed: No Necrotic Amount: None Present (0%) Fat Layer (Subcutaneous Tissue) Exposed: Yes Tendon Exposed: No Muscle Exposed: No Joint Exposed: No Bone Exposed: No Assessment Notes maceration noted Treatment Notes Wound #1 (Toe Fifth) Wound Laterality: Left Cleanser Soap and Water Discharge Instruction: May shower and wash wound with dial antibacterial soap and water prior to dressing change. Peri-Wound Care Topical Primary Dressing KerraCel Ag Gelling Fiber  Dressing, 2x2 in (silver alginate) Discharge Instruction: Apply silver alginate to wound bed as instructed Secondary Dressing Woven Gauze Sponges 2x2 in Discharge Instruction: Apply over primary dressing as directed. Secured With 1M Medipore H Soft Cloth Surgical T ape, 4 x 10 (in/yd) Discharge Instruction: Secure with tape as directed. Compression Wrap Compression Stockings Add-Ons Electronic Signature(s) Signed: 06/17/2022 4:44:39 PM By: [name] Entered By: [name] on 06/17/2022 08:58:57 -------------------------------------------------------------------------------- Vitals Details Patient Name: Date of Service: [name], KA RO [name]. 06/17/2022 8:00 A M Medical Record Number: [id number] Patient Account Number: [id number] Date of Birth/Sex: Treating RN: [birth year] (66 y.o. [name] Primary Care Chana Lindstrom: [name] Other Clinician: Referring Valerio Pinard: Treating Liliane Mallis/Extender: Georgette Shell in Treatment: 0 Vital Signs Time Taken: 08:06 Temperature (F): 98.3 Height (in): 66 Pulse (bpm): 82 Source: Stated Respiratory Rate (breaths/min): 20 Weight (lbs): 212 Blood Pressure (mmHg): 126/73 Source: Stated Reference Range: 80 - 120 mg / dl Body Mass Index (BMI): 34.2 Electronic Signature(s) Signed: 06/17/2022 4:44:39 PM By: [name] Entered By: [name] on 06/17/2022 08:07:41

## 2022-06-17 ENCOUNTER — Encounter (HOSPITAL_BASED_OUTPATIENT_CLINIC_OR_DEPARTMENT_OTHER): Payer: Medicare Other | Attending: Internal Medicine | Admitting: Internal Medicine

## 2022-06-17 DIAGNOSIS — L97518 Non-pressure chronic ulcer of other part of right foot with other specified severity: Secondary | ICD-10-CM | POA: Insufficient documentation

## 2022-06-17 DIAGNOSIS — K219 Gastro-esophageal reflux disease without esophagitis: Secondary | ICD-10-CM | POA: Insufficient documentation

## 2022-06-17 DIAGNOSIS — M1990 Unspecified osteoarthritis, unspecified site: Secondary | ICD-10-CM | POA: Insufficient documentation

## 2022-06-17 DIAGNOSIS — G629 Polyneuropathy, unspecified: Secondary | ICD-10-CM | POA: Insufficient documentation

## 2022-06-17 DIAGNOSIS — I2510 Atherosclerotic heart disease of native coronary artery without angina pectoris: Secondary | ICD-10-CM | POA: Diagnosis not present

## 2022-06-17 DIAGNOSIS — L97522 Non-pressure chronic ulcer of other part of left foot with fat layer exposed: Secondary | ICD-10-CM | POA: Diagnosis not present

## 2022-06-17 DIAGNOSIS — I4891 Unspecified atrial fibrillation: Secondary | ICD-10-CM | POA: Insufficient documentation

## 2022-06-17 NOTE — Progress Notes (Signed)
BERNEICE, ZETTLEMOYER ([id number]) Visit Report for 06/17/2022 Chief Complaint Document Details Patient Name: Date of Service: [name]. 06/17/2022 8:00 A M Medical Record Number: [id number] Patient Account Number: [id number] Date of Birth/Sex: Treating RN: 1956/10/06 (66 y.o. [name] Primary Care Provider: [name] Other Clinician: Referring Provider: Treating Provider/Extender: Milford Cage, Alexis Goodell in Treatment: 0 Information Obtained from: Patient Chief Complaint 06/17/2022; patient is here for review of an area on her left fifth toe. Last month she had a similar area on the right first toe that healed. Electronic Signature(s) Signed: 06/17/2022 4:38:07 PM By: Linton Ham MD Entered By: Linton Ham on 06/17/2022 09:30:17 -------------------------------------------------------------------------------- HPI Details Patient Name: Date of Service: [name], [place] [name]. 06/17/2022 8:00 A M Medical Record Number: [id number] Patient Account Number: [id number] Date of Birth/Sex: Treating RN: [birth year] (66 y.o. [name] Primary Care Provider: [name] Other Clinician: Referring Provider: Treating Provider/Extender: Georgette Shell in Treatment: 0 History of Present Illness HPI Description: ADMISSION 06/17/2022 This is a 66 year old nondiabetic woman who woke up at the end of last month with a wound on her right great toe and to a lesser extent on the tip of her right third toe. She was seen in urgent care she was given Silvadene told she had a hematoma. At that time she apparently had developed edema in both her legs which responded to Lasix. These wounds healed. Earlier this month she developed a similar area on daily left fifth toe. This apparently bled profusely. The patient was seen in the podiatry office by Dr. Eliott Nine. She was given a healing sandal. The wound was debrided and dressed and it is quite a  bit better. She is here for our discussion about why this is happening to her. Past medical history; atrial fibrillation on Xarelto, achalasia, lumbar spine surgery, gastroesophageal reflux disease, total knee replacements x2 ABIs in our clinic were 1.1 bilaterally Electronic Signature(s) Signed: 06/17/2022 4:38:07 PM By: Linton Ham MD Entered By: Linton Ham on 06/17/2022 09:33:47 -------------------------------------------------------------------------------- Physical Exam Details Patient Name: Date of Service: [name], Despina Pole. 06/17/2022 8:00 A M Medical Record Number: [id number] Patient Account Number: [id number] Date of Birth/Sex: Treating RN: 1956-07-26 (66 y.o. [name] Primary Care Provider: [name] Other Clinician: Referring Provider: Treating Provider/Extender: Georgette Shell in Treatment: 0 Constitutional Sitting or standing Blood Pressure is within target range for patient.. Pulse regular and within target range for patient.Marland Kitchen Respirations regular, non-labored and within target range.. Temperature is normal and within the target range for the patient.Marland Kitchen Appears in no distress. Cardiovascular Pedal pulses palpable and strong bilaterally.. No convincing evidence of significant chronic venous insufficiency. Integumentary (Hair, Skin) No evidence of tinea pedis. Neurological Did not do particularly well with the monofilament test on the left foot although right foot is satisfactory.. Notes Wound exam; everything on the right foot is closed however she has callus buildup over the right first metatarsal head to a lesser extent on the plantar right heel. On the involved left foot she has a healing wound over the left fifth toe that is epithelializing. It looks as though she lost quite a bit of skin over the tip of her toe. I can see no evidence of infection, significant PAD or chronic venous insufficiency Electronic  Signature(s) Signed: 06/17/2022 4:38:07 PM By: Linton Ham MD Entered By: Linton Ham on 06/17/2022 09:35:46 -------------------------------------------------------------------------------- Physician Orders Details Patient Name: Date of Service:  WHITA KER, KA RO N W. 06/17/2022 8:00 A M Medical Record Number: [id number] Patient Account Number: [id number] Date of Birth/Sex: Treating RN: 1956/04/27 (66 y.o. [name] Primary Care Provider: [name] Other Clinician: Referring Provider: Treating Provider/Extender: Georgette Shell in Treatment: 0 Verbal / Phone Orders: No Diagnosis Coding Follow-up Appointments ppointment in 2 weeks. - 07/01/22 @ 8:00am with Orland Jarred, RN (Room 7) Return A Edema Control - Lymphedema / SCD / Other Elevate legs to the level of the heart or above for 30 minutes daily and/or when sitting, a frequency of: - several times a day Avoid standing for long periods of time. Moisturize legs daily. Off-Loading Other: - Do not wear flip flops-you may wear well supportive sandals Additional Orders / Instructions Follow Nutritious Diet Wound Treatment Wound #1 - T Fifth oe Wound Laterality: Left Cleanser: Soap and Water 1 x Per Day/30 Days Discharge Instructions: May shower and wash wound with dial antibacterial soap and water prior to dressing change. Prim Dressing: KerraCel Ag Gelling Fiber Dressing, 2x2 in (silver alginate) 1 x Per Day/30 Days ary Discharge Instructions: Apply silver alginate to wound bed as instructed Secondary Dressing: Woven Gauze Sponges 2x2 in 1 x Per Day/30 Days Discharge Instructions: Apply over primary dressing as directed. Secured With: 93M Medipore H Soft Cloth Surgical T ape, 4 x 10 (in/yd) 1 x Per Day/30 Days Discharge Instructions: Secure with tape as directed. Electronic Signature(s) Signed: 06/17/2022 4:38:07 PM By: Linton Ham MD Signed: 06/17/2022 4:44:39 PM By: [name] Entered  By: [name] on 06/17/2022 09:13:21 -------------------------------------------------------------------------------- Problem List Details Patient Name: Date of Service: [name], Martin Majestic RO [name]. 06/17/2022 8:00 A M Medical Record Number: [id number] Patient Account Number: [id number] Date of Birth/Sex: Treating RN: 12/27/1955 (66 y.o. [name] Primary Care Provider: [name] Other Clinician: Referring Provider: Treating Provider/Extender: Georgette Shell in Treatment: 0 Active Problems ICD-10 Encounter Code Description Active Date MDM Diagnosis L97.529 Non-pressure chronic ulcer of other part of left foot with unspecified severity 06/17/2022 No Yes L97.518 Non-pressure chronic ulcer of other part of right foot with other specified 06/17/2022 No Yes severity Inactive Problems Resolved Problems Electronic Signature(s) Signed: 06/17/2022 4:38:07 PM By: Linton Ham MD Entered By: Linton Ham on 06/17/2022 09:27:42 -------------------------------------------------------------------------------- Progress Note Details Patient Name: Date of Service: [name], Martin Majestic RO [name]. 06/17/2022 8:00 A M Medical Record Number: [id number] Patient Account Number: [id number] Date of Birth/Sex: Treating RN: Nov 02, 1956 (66 y.o. [name] Primary Care Provider: [name] Other Clinician: Referring Provider: Treating Provider/Extender: Milford Cage, Alexis Goodell in Treatment: 0 Subjective Chief Complaint Information obtained from Patient 06/17/2022; patient is here for review of an area on her left fifth toe. Last month she had a similar area on the right first toe that healed. History of Present Illness (HPI) ADMISSION 06/17/2022 This is a 65 year old nondiabetic woman who woke up at the end of last month with a wound on her right great toe and to a lesser extent on the tip of her right third toe. She was seen in urgent care she was given  Silvadene told she had a hematoma. At that time she apparently had developed edema in both her legs which responded to Lasix. These wounds healed. Earlier this month she developed a similar area on daily left fifth toe. This apparently bled profusely. The patient was seen in the podiatry office by Dr. Eliott Nine. She was given a healing sandal. The wound  was debrided and dressed and it is quite a bit better. She is here for our discussion about why this is happening to her. Past medical history; atrial fibrillation on Xarelto, achalasia, lumbar spine surgery, gastroesophageal reflux disease, total knee replacements x2 ABIs in our clinic were 1.1 bilaterally Patient History Information obtained from Patient, Chart. Allergies warfarin Family History Heart Disease - Mother,Father, Hypertension - Mother,Father, No family history of Cancer, Diabetes, Hereditary Spherocytosis, Kidney Disease, Lung Disease, Seizures, Stroke, Thyroid Problems, Tuberculosis. Social History Never smoker, Marital Status - Married, Alcohol Use - Never, Drug Use - No History, Caffeine Use - Rarely. Medical History Eyes Patient has history of Cataracts - surgery Cardiovascular Patient has history of Arrhythmia - A Fib, Coronary Artery Disease, Hypertension Musculoskeletal Patient has history of Osteoarthritis Neurologic Patient has history of Neuropathy Medical A Surgical History Notes nd Endocrine Hypothyroidism Oncologic Breast Cancer 2018 Review of Systems (ROS) Eyes Denies complaints or symptoms of Dry Eyes, Vision Changes, Glasses / Contacts. Ear/Nose/Mouth/Throat Denies complaints or symptoms of Chronic sinus problems or rhinitis. Respiratory Complains or has symptoms of Shortness of Breath. Gastrointestinal Denies complaints or symptoms of Frequent diarrhea, Nausea, Vomiting. Genitourinary Denies complaints or symptoms of Frequent urination. Integumentary (Skin) Complains or has symptoms of  Wounds - feet. Psychiatric Denies complaints or symptoms of Claustrophobia, Suicidal. Objective Constitutional Sitting or standing Blood Pressure is within target range for patient.. Pulse regular and within target range for patient.Marland Kitchen Respirations regular, non-labored and within target range.. Temperature is normal and within the target range for the patient.Marland Kitchen Appears in no distress. Vitals Time Taken: 8:06 AM, Height: 66 in, Source: Stated, Weight: 212 lbs, Source: Stated, BMI: 34.2, Temperature: 98.3 F, Pulse: 82 bpm, Respiratory Rate: 20 breaths/min, Blood Pressure: 126/73 mmHg. Cardiovascular Pedal pulses palpable and strong bilaterally.. No convincing evidence of significant chronic venous insufficiency. Neurological Did not do particularly well with the monofilament test on the left foot although right foot is satisfactory.. General Notes: Wound exam; everything on the right foot is closed however she has callus buildup over the right first metatarsal head to a lesser extent on the plantar right heel. On the involved left foot she has a healing wound over the left fifth toe that is epithelializing. It looks as though she lost quite a bit of skin over the tip of her toe. I can see no evidence of infection, significant PAD or chronic venous insufficiency Integumentary (Hair, Skin) No evidence of tinea pedis. Wound #1 status is Open. Original cause of wound was Blister. The date acquired was: 05/26/2022. The wound is located on the Left T Fifth. The wound oe measures 2.5cm length x 1.5cm width x 0.1cm depth; 2.945cm^2 area and 0.295cm^3 volume. There is Fat Layer (Subcutaneous Tissue) exposed. There is no tunneling or undermining noted. There is a medium amount of sanguinous drainage noted. The wound margin is distinct with the outline attached to the wound base. There is large (67-100%) red granulation within the wound bed. There is no necrotic tissue within the wound bed. 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 - 07/01/22 @ 8:00am with Orland Jarred, RN (Room 7) Edema Control - Lymphedema / SCD / Other: Elevate legs to the level of the heart or above for 30 minutes daily and/or when sitting, a frequency of: -  several times a day Avoid standing for long periods of time. Moisturize legs daily. Off-Loading: Other: - Do not wear flip flops-you may wear well supportive sandals Additional Orders / Instructions: Follow Nutritious Diet WOUND #1: - T Fifth Wound Laterality: Left oe Cleanser: Soap and Water 1 x Per Day/30 Days Discharge Instructions: May shower and wash wound with dial antibacterial soap and water prior to dressing change. Prim Dressing: KerraCel Ag Gelling Fiber Dressing, 2x2 in (silver alginate) 1 x Per Day/30 Days ary Discharge Instructions: Apply silver alginate to wound bed as instructed Secondary Dressing: Woven Gauze Sponges 2x2 in 1 x Per Day/30 Days Discharge Instructions: Apply over primary dressing as directed. Secured With: 69M Medipore H Soft Cloth Surgical T ape, 4 x 10 (in/yd) 1 x Per Day/30 Days Discharge Instructions: Secure with tape as directed. 1. The patient is developing blisters/wounds on her bilateral feet. She is puzzled as to why this is occurring. She was wearing a healing sandal that was given to her by podiatry on the left foot but a very dilapidated flip-flop on the right foot. I think the latter is the source of her problems and if she is going to wear sandals I think she needs a better pair of sandals. 2. If there is an alternative explanation to what is going on I did not really see it in until she has reoccurrences when she is wearing proper shoes I would not give it too much  additional thought. Fortunately the wound on the left fifth toe is healing in the same fashion as the right first toe we used calcium alginate 3. She is going to the beach in 2 days I asked her to be cautious this to wear better footwear and if she is going to walk in the sand to wear of each shoes 4. Interestingly she did not do well with the monofilament in the left foot. This may have something to do with her elbow as spine surgery or perhaps she is in the early stage of a nondiabetic neuropathy. This could contribute to the genesis of wounds 5. I am fairly confident that proper foot wear will fix this problem and I discussed this with her. 6. Dr. Paulla Dolly had ordered lab work and I note that she is anemic slightly hypochromic but normocytic. She apparently has been on iron in the past but did not tolerated. I have asked her to speak to her primary doctor Dr. Forde Dandy about this to firm up the diagnosis of iron deficiency and see what she can do to replace iron if that is the problem Electronic Signature(s) Signed: 06/17/2022 4:38:07 PM By: Linton Ham MD Entered By: Linton Ham on 06/17/2022 09:38:50 -------------------------------------------------------------------------------- HxROS Details Patient Name: Date of Service: [name], [name]. 06/17/2022 8:00 A M Medical Record Number: [id number] Patient Account Number: [id number] Date of Birth/Sex: Treating RN: 1956-05-01 (66 y.o. [name] Primary Care Provider: [name] Other Clinician: Referring Provider: Treating Provider/Extender: Georgette Shell in Treatment: 0 Information Obtained From Patient Chart Eyes Complaints and Symptoms: Negative for: Dry Eyes; Vision Changes; Glasses / Contacts Medical History: Positive for: Cataracts - surgery Ear/Nose/Mouth/Throat Complaints and Symptoms: Negative for: Chronic sinus problems or rhinitis Respiratory Complaints and Symptoms: Positive for:  Shortness of Breath Gastrointestinal Complaints and Symptoms: Negative for: Frequent diarrhea; Nausea; Vomiting Genitourinary Complaints and Symptoms: Negative for: Frequent urination Integumentary (Skin) Complaints and Symptoms: Positive for: Wounds - feet Psychiatric Complaints and Symptoms: Negative for: Claustrophobia; Suicidal Cardiovascular  Medical History: Positive for: Arrhythmia - A Fib; Coronary Artery Disease; Hypertension Endocrine Medical History: Past Medical History Notes: Hypothyroidism Immunological Musculoskeletal Medical History: Positive for: Osteoarthritis Neurologic Medical History: Positive for: Neuropathy Oncologic Medical History: Past Medical History Notes: Breast Cancer 2018 HBO Extended History Items Eyes: Cataracts Immunizations Pneumococcal Vaccine: Received Pneumococcal Vaccination: Yes Received Pneumococcal Vaccination On or After 60th Birthday: No Implantable Devices No devices added Family and Social History Cancer: No; Diabetes: No; Heart Disease: Yes - Mother,Father; Hereditary Spherocytosis: No; Hypertension: Yes - Mother,Father; Kidney Disease: No; Lung Disease: No; Seizures: No; Stroke: No; Thyroid Problems: No; Tuberculosis: No; Never smoker; Marital Status - Married; Alcohol Use: Never; Drug Use: No History; Caffeine Use: Rarely; Financial Concerns: No; Food, Clothing or Shelter Needs: No; Support System Lacking: No; Transportation Concerns: No Electronic Signature(s) Signed: 06/17/2022 4:38:07 PM By: Linton Ham MD Signed: 06/17/2022 4:44:39 PM By: [name] Entered By: [name] on 06/17/2022 08:16:49 -------------------------------------------------------------------------------- [place] Details Patient Name: Date of Service: [name], Despina Pole. 06/17/2022 Medical Record Number: [id number] Patient Account Number: [id number] Date of Birth/Sex: Treating RN: Dec 14, 1955 (66 y.o. 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 L97.518 Non-pressure chronic ulcer of other part of right foot with other specified severity Facility Procedures CPT4 Code: [id number] Description: 99214 - WOUND CARE VISIT-LEV 4 EST PT Modifier: 25 Quantity: 1 Physician Procedures : CPT4 Code Description Modifier [id number] WC PHYS LEVEL 3 NEW PT ICD-10 Diagnosis Description L97.529 Non-pressure chronic ulcer of other part of left foot with unspecified severity L97.518 Non-pressure chronic ulcer of other part of right foot with other  specified severity Quantity: 1 Electronic Signature(s) Signed: 06/17/2022 4:38:07 PM By: Linton Ham MD Entered By: Linton Ham on 06/17/2022 09:39:27

## 2022-06-17 NOTE — Progress Notes (Signed)
CAREEN, MAUCH ([id number]) Visit Report for 06/17/2022 Abuse Risk Screen Details Patient Name: Date of Service: Kathryn [name]. 06/17/2022 8:00 A M Medical Record Number: [id number] Patient Account Number: [id number] Date of Birth/Sex: Treating RN: November 01, 1956 (66 y.o. Kathryn [name] Primary Care Elyshia Kumagai: [name] Other Clinician: Referring Destiny Trickey: Treating Norva Bowe/Extender: Georgette Shell in Treatment: 0 Abuse Risk Screen Items Answer ABUSE RISK SCREEN: Has anyone close to you tried to hurt or harm you recentlyo No Do you feel uncomfortable with anyone in your familyo No Has anyone forced you do things that you didnt want to doo No Electronic Signature(s) Signed: 06/17/2022 4:44:39 PM By: [name] Entered By: [name] on 06/17/2022 08:16:56 -------------------------------------------------------------------------------- Activities of Daily Living Details Patient Name: Date of Service: Kathryn [name]. 06/17/2022 8:00 A M Medical Record Number: [id number] Patient Account Number: [id number] Date of Birth/Sex: Treating RN: Nov 21, 1956 (66 y.o. Kathryn [name] Primary Care Kajol Crispen: [name] Other Clinician: Referring Meng Winterton: Treating Nagee Goates/Extender: Georgette Shell in Treatment: 0 Activities of Daily Living Items Answer Activities of Daily Living (Please select one for each item) Drive Automobile Not Able T Medications ake Completely Able Use T elephone Completely Able Care for Appearance Completely Able Use T oilet Completely Able Bath / Shower Completely Able Dress Self Completely Able Feed Self Completely Able Walk Completely Able Get In / Out Bed Completely Able Housework Completely Able Prepare Meals Completely Able Handle Money Completely Able Shop for Self Completely Able Electronic Signature(s) Signed: 06/17/2022 4:44:39 PM By: [name] Entered By: [name] on  06/17/2022 08:17:26 -------------------------------------------------------------------------------- Education Screening Details Patient Name: Date of Service: Kathryn [name], Kathryn Range N W. 06/17/2022 8:00 A M Medical Record Number: [id number] Patient Account Number: [id number] Date of Birth/Sex: Treating RN: 1956/05/20 (66 y.o. Kathryn [name] Primary Care Capri Raben: [name] Other Clinician: Referring Antoria Lanza: Treating Rayden Scheper/Extender: Georgette Shell in Treatment: 0 Primary Learner Assessed: Patient Learning Preferences/Education Level/Primary Language Learning Preference: Explanation, Demonstration, Printed Material Highest Education Level: High School Preferred Language: English Cognitive Barrier Language Barrier: No Translator Needed: No Memory Deficit: No Emotional Barrier: No Cultural/Religious Beliefs Affecting Medical Care: No Physical Barrier Impaired Vision: No Impaired Hearing: No Decreased Hand dexterity: No Knowledge/Comprehension Knowledge Level: High Comprehension Level: High Ability to understand written instructions: High Ability to understand verbal instructions: High Motivation Anxiety Level: Calm Cooperation: Cooperative Education Importance: Acknowledges Need Interest in Health Problems: Asks Questions Perception: Coherent Willingness to Engage in Self-Management High Activities: Readiness to Engage in Self-Management High Activities: Electronic Signature(s) Signed: 06/17/2022 4:44:39 PM By: [name] Entered By: [name] on 06/17/2022 08:17:56 -------------------------------------------------------------------------------- Fall Risk Assessment Details Patient Name: Date of Service: Kathryn [name], Kathryn Healthy [name]. 06/17/2022 8:00 A M Medical Record Number: [id number] Patient Account Number: [id number] Date of Birth/Sex: Treating RN: 07-Dec-1955 (66 y.o. Kathryn [name] Primary Care Annamae Shivley: [name] Other  Clinician: Referring Mattheu Brodersen: Treating Ojas Coone/Extender: Georgette Shell in Treatment: 0 Fall Risk Assessment Items Have you had 2 or more falls in the last 12 monthso 0 No Have you had any fall that resulted in injury in the last 12 monthso 0 No FALLS RISK SCREEN History of falling - immediate or within 3 months 0 No Secondary diagnosis (Do you have 2 or more medical diagnoseso) 15 Yes Ambulatory aid None/bed rest/wheelchair/nurse 0 No Crutches/cane/walker 15 Yes Furniture 0 No Intravenous therapy Access/Saline/Heparin Lock 0 No Gait/Transferring Normal/ bed rest/ wheelchair  0 Yes Weak (short steps with or without shuffle, stooped but able to lift head while walking, may seek 0 No support from furniture) Impaired (short steps with shuffle, may have difficulty arising from chair, head down, impaired 0 No balance) Mental Status Oriented to own ability 0 Yes Electronic Signature(s) Signed: 06/17/2022 4:44:39 PM By: [name] Entered By: [name] on 06/17/2022 08:18:27 -------------------------------------------------------------------------------- Foot Assessment Details Patient Name: Date of Service: Kathryn [name], Kathryn Majestic RO N W. 06/17/2022 8:00 A M Medical Record Number: [id number] Patient Account Number: [id number] Date of Birth/Sex: Treating RN: November 19, 1956 (66 y.o. Kathryn [name] Primary Care Bricia Taher: [name] Other Clinician: Referring Genevive Printup: Treating Merik Mignano/Extender: Georgette Shell in Treatment: 0 Foot Assessment Items Site Locations + = Sensation present, - = Sensation absent, C = Callus, U = Ulcer R = Redness, W = Warmth, M = Maceration, PU = Pre-ulcerative lesion F = Fissure, S = Swelling, D = Dryness Assessment Right: Left: Other Deformity: No No Prior Foot Ulcer: No No Prior Amputation: No No Charcot Joint: No No Ambulatory Status: Ambulatory With Help Assistance Device: Cane Gait:  Steady Electronic Signature(s) Signed: 06/17/2022 4:44:39 PM By: [name] Entered By: [name] on 06/17/2022 08:24:11 -------------------------------------------------------------------------------- Nutrition Risk Screening Details Patient Name: Date of Service: Kathryn Canterbury RO N W. 06/17/2022 8:00 A M Medical Record Number: [id number] Patient Account Number: [id number] Date of Birth/Sex: Treating RN: 1956-07-14 (66 y.o. Kathryn [name] Primary Care Khing Belcher: [name] Other Clinician: Referring Wynnie Pacetti: Treating Josephus Harriger/Extender: Georgette Shell in Treatment: 0 Height (in): 66 Weight (lbs): 212 Body Mass Index (BMI): 34.2 Nutrition Risk Screening Items Score Screening NUTRITION RISK SCREEN: I have an illness or condition that made me change the kind and/or amount of food I eat 0 No I eat fewer than two meals per day 0 No I eat few fruits and vegetables, or milk products 0 No I have three or more drinks of beer, liquor or wine almost every day 0 No I have tooth or mouth problems that make it hard for me to eat 0 No I don't always have enough money to buy the food I need 0 No I eat alone most of the time 0 No I take three or more different prescribed or over-the-counter drugs a day 1 Yes Without wanting to, I have lost or gained 10 pounds in the last six months 0 No I am not always physically able to shop, cook and/or feed myself 0 No Nutrition Protocols Good Risk Protocol 0 No interventions needed Moderate Risk Protocol High Risk Proctocol Risk Level: Good Risk Score: 1 Electronic Signature(s) Signed: 06/17/2022 4:44:39 PM By: [name] Entered By: [name] on 06/17/2022 08:19:47

## 2022-07-01 ENCOUNTER — Encounter (HOSPITAL_BASED_OUTPATIENT_CLINIC_OR_DEPARTMENT_OTHER): Payer: Medicare Other | Attending: Physician Assistant | Admitting: Physician Assistant

## 2022-07-01 DIAGNOSIS — Z96653 Presence of artificial knee joint, bilateral: Secondary | ICD-10-CM | POA: Insufficient documentation

## 2022-07-01 DIAGNOSIS — K219 Gastro-esophageal reflux disease without esophagitis: Secondary | ICD-10-CM | POA: Diagnosis not present

## 2022-07-01 DIAGNOSIS — Z7901 Long term (current) use of anticoagulants: Secondary | ICD-10-CM | POA: Diagnosis not present

## 2022-07-01 DIAGNOSIS — I4891 Unspecified atrial fibrillation: Secondary | ICD-10-CM | POA: Insufficient documentation

## 2022-07-01 DIAGNOSIS — L97518 Non-pressure chronic ulcer of other part of right foot with other specified severity: Secondary | ICD-10-CM | POA: Diagnosis not present

## 2022-07-01 DIAGNOSIS — L97529 Non-pressure chronic ulcer of other part of left foot with unspecified severity: Secondary | ICD-10-CM | POA: Insufficient documentation

## 2022-07-01 DIAGNOSIS — K220 Achalasia of cardia: Secondary | ICD-10-CM | POA: Diagnosis not present

## 2022-07-01 DIAGNOSIS — L97522 Non-pressure chronic ulcer of other part of left foot with fat layer exposed: Secondary | ICD-10-CM | POA: Diagnosis not present

## 2022-07-01 NOTE — Progress Notes (Signed)
Kathryn [name], Kathryn [name] ([id number]) Visit Report for 07/01/2022 Chief Complaint Document Details Patient Name: Date of Service: [name]. 07/01/2022 8:00 A M Medical Record Number: [id number] Patient Account Number: [id number] Date of Birth/Sex: Treating RN: [birth year] (66 y.o. [name] Primary Care Provider: [name] Other Clinician: Referring Provider: Treating Provider/Extender: [name] in Treatment: 2 Information Obtained from: Patient Chief Complaint 06/17/2022; patient is here for review of an area on her left fifth toe. Last month she had a similar area on the right first toe that healed. Electronic Signature(s) Signed: 07/01/2022 8:35:49 AM By: Worthy Keeler PA-C Entered By: Worthy Keeler on 07/01/2022 08:35:48 -------------------------------------------------------------------------------- Problem List Details Patient Name: Date of Service: Kathryn [name], Kathryn [name] [name]. 07/01/2022 8:00 A M Medical Record Number: [id number] Patient Account Number: [id number] Date of Birth/Sex: Treating RN: [birth year] (66 y.o. 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

## 2022-07-01 NOTE — Progress Notes (Addendum)
Kathryn [name], Kathryn [name] ([id number]) Visit Report for 07/01/2022 Arrival Information Details Patient Name: Date of Service: Kathryn [name]. 07/01/2022 8:00 A M Medical Record Number: [id number] Patient Account Number: [id number] Date of Birth/Sex: Treating RN: June 21, 1956 (66 y.o. [name] Primary Care [name]: [name] Other Clinician: Referring [name]: Treating [name]/Extender: [name] in Treatment: 2 Visit Information History Since Last Visit Added or deleted any medications: No Patient Arrived: Kathryn [name] Any new allergies or adverse reactions: No Arrival Time: 08:03 Had a fall or experienced change in No Accompanied By: husband activities of daily living that may affect Transfer Assistance: None risk of falls: Patient Identification Verified: Yes Signs or symptoms of abuse/neglect since last visito No Secondary Verification Process Completed: Yes Hospitalized since last visit: No Patient Requires Transmission-Based Precautions: No Implantable device outside of the clinic excluding No Patient Has Alerts: Yes cellular tissue based products placed in the center Patient Alerts: Patient on Blood Thinner since last visit: Has Dressing in Place as Prescribed: Yes Pain Present Now: No Electronic Signature(s) Signed: 07/01/2022 8:03:32 AM By: [name] Entered By: [name] on 07/01/2022 08:03:32 -------------------------------------------------------------------------------- Clinic Level of Care Assessment Details Patient Name: Date of Service: Kathryn [name], Kathryn [name] RO N W. 07/01/2022 8:00 A M Medical Record Number: [id number] Patient Account Number: [id number] Date of Birth/Sex: Treating RN: 09-06-1956 (66 y.o. [name] Primary Care Myrene Bougher: [name] Other Clinician: Referring Yovanni Frenette: Treating [name]/Extender: [name] in Treatment: 2 Clinic Level of Care Assessment Items TOOL 4 Quantity  Score X- 1 0 Use when only an EandM is performed on FOLLOW-UP visit ASSESSMENTS - Nursing Assessment / Reassessment X- 1 10 Reassessment of Co-morbidities (includes updates in patient status) X- 1 5 Reassessment of Adherence to Treatment Plan ASSESSMENTS - Wound and Skin A ssessment / Reassessment X - Simple Wound Assessment / Reassessment - one wound 1 5 '[]'$  - 0 Complex Wound Assessment / Reassessment - multiple wounds '[]'$  - 0 Dermatologic / Skin Assessment (not related to wound area) ASSESSMENTS - Focused Assessment '[]'$  - 0 Circumferential Edema Measurements - multi extremities '[]'$  - 0 Nutritional Assessment / Counseling / Intervention '[]'$  - 0 Lower Extremity Assessment (monofilament, tuning fork, pulses) '[]'$  - 0 Peripheral Arterial Disease Assessment (using hand held doppler) ASSESSMENTS - Ostomy and/or Continence Assessment and Care '[]'$  - 0 Incontinence Assessment and Management '[]'$  - 0 Ostomy Care Assessment and Management (repouching, etc.) PROCESS - Coordination of Care '[]'$  - 0 Simple Patient / Family Education for ongoing care X- 1 20 Complex (extensive) Patient / Family Education for ongoing care '[]'$  - 0 Staff obtains Programmer, systems, Records, T Results / Process Orders est '[]'$  - 0 Staff telephones HHA, Nursing Homes / Clarify orders / etc '[]'$  - 0 Routine Transfer to another Facility (non-emergent condition) '[]'$  - 0 Routine Hospital Admission (non-emergent condition) '[]'$  - 0 New Admissions / Biomedical engineer / Ordering NPWT Apligraf, etc. , '[]'$  - 0 Emergency Hospital Admission (emergent condition) '[]'$  - 0 Simple Discharge Coordination '[]'$  - 0 Complex (extensive) Discharge Coordination PROCESS - Special Needs '[]'$  - 0 Pediatric / Minor Patient Management '[]'$  - 0 Isolation Patient Management '[]'$  - 0 Hearing / Language / Visual special needs '[]'$  - 0 Assessment of Community assistance (transportation, D/C planning, etc.) '[]'$  - 0 Additional assistance / Altered  mentation '[]'$  - 0 Support Surface(s) Assessment (bed, cushion, seat, etc.) INTERVENTIONS - Wound Cleansing / Measurement X - Simple Wound Cleansing - one wound  1 5 '[]'$  - 0 Complex Wound Cleansing - multiple wounds X- 1 5 Wound Imaging (photographs - any number of wounds) '[]'$  - 0 Wound Tracing (instead of photographs) X- 1 5 Simple Wound Measurement - one wound '[]'$  - 0 Complex Wound Measurement - multiple wounds INTERVENTIONS - Wound Dressings X - Small Wound Dressing one or multiple wounds 1 10 '[]'$  - 0 Medium Wound Dressing one or multiple wounds '[]'$  - 0 Large Wound Dressing one or multiple wounds '[]'$  - 0 Application of Medications - topical '[]'$  - 0 Application of Medications - injection INTERVENTIONS - Miscellaneous '[]'$  - 0 External ear exam '[]'$  - 0 Specimen Collection (cultures, biopsies, blood, body fluids, etc.) '[]'$  - 0 Specimen(s) / Culture(s) sent or taken to Lab for analysis '[]'$  - 0 Patient Transfer (multiple staff / Civil Service fast streamer / Similar devices) '[]'$  - 0 Simple Staple / Suture removal (25 or less) '[]'$  - 0 Complex Staple / Suture removal (26 or more) '[]'$  - 0 Hypo / Hyperglycemic Management (close monitor of Blood Glucose) '[]'$  - 0 Ankle / Brachial Index (ABI) - do not check if billed separately X- 1 5 Vital Signs Has the patient been seen at the hospital within the last three years: Yes Total Score: 70 Level Of Care: New/Established - Level 2 Electronic Signature(s) Signed: 07/01/2022 5:34:04 PM By: [name] Entered By: [name] on 07/01/2022 09:00:59 -------------------------------------------------------------------------------- Encounter Discharge Information Details Patient Name: Date of Service: Kathryn [name], Kathryn [name] RO N W. 07/01/2022 8:00 A M Medical Record Number: [id number] Patient Account Number: [id number] Date of Birth/Sex: Treating RN: Oct 22, 1956 (66 y.o. [name] Primary Care Bartolo Montanye: [name] Other Clinician: Referring [name]: Treating  [name]/Extender: [name] in Treatment: 2 Encounter Discharge Information Items Discharge Condition: Stable Ambulatory Status: Cane Discharge Destination: Home Transportation: Private Auto Accompanied By: Husband Schedule Follow-up Appointment: Yes Clinical Summary of Care: Provided on 07/01/2022 Form Type Recipient Paper Patient Patient Electronic Signature(s) Signed: 07/01/2022 9:02:26 AM By: [name] Entered By: [name] on 07/01/2022 09:02:26 -------------------------------------------------------------------------------- Lower Extremity Assessment Details Patient Name: Date of Service: Kathryn [name], Wallace [name]. 07/01/2022 8:00 A M Medical Record Number: [id number] Patient Account Number: [id number] Date of Birth/Sex: Treating RN: 1956/06/09 (66 y.o. [name] Primary Care Jarrod Mcenery: [name] Other Clinician: Referring Carlise Stofer: Treating Rylyn Zawistowski/Extender: [name] in Treatment: 2 Edema Assessment Assessed: [Left: Yes] [Right: No] Edema: [Left: Ye] [Right: s] Calf Left: Right: Point of Measurement: 28 cm From Medial Instep 38 cm Ankle Left: Right: Point of Measurement: 11 cm From Medial Instep 24.5 cm Vascular Assessment Pulses: Dorsalis Pedis Palpable: [Left:Yes] Electronic Signature(s) Signed: 07/01/2022 5:34:04 PM By: [name] Entered By: [name] on 07/01/2022 08:07:47 -------------------------------------------------------------------------------- Multi-Disciplinary Care Plan Details Patient Name: Date of Service: Kathryn [name], [place] [name]. 07/01/2022 8:00 A M Medical Record Number: [id number] Patient Account Number: [id number] Date of Birth/Sex: Treating RN: 31-Aug-1956 (66 y.o. [name] Primary Care Vrishank Moster: [name] Other Clinician: Referring Kalim Kissel: Treating Kacper Cartlidge/Extender: Margot Ables, Alexis Goodell in Treatment: 2 Active Inactive Electronic  Signature(s) Signed: 07/13/2022 1:03:43 PM By: [name] Previous Signature: 07/01/2022 8:02:33 AM Version By: [name] Entered By: [name] on 07/13/2022 13:03:43 -------------------------------------------------------------------------------- Pain Assessment Details Patient Name: Date of Service: Kathryn [name], Despina Pole. 07/01/2022 8:00 A M Medical Record Number: [id number] Patient Account Number: [id number] Date of Birth/Sex: Treating RN: 1955/11/27 (66 y.o. [name] Primary Care Nira Visscher: [place],  Annie Main Other Clinician: Referring Darien Kading: Treating Nasra Counce/Extender: [name] in Treatment: 2 Active Problems Location of Pain Severity and Description of Pain Patient Has Paino No Site Locations Pain Management and Medication Current Pain Management: Electronic Signature(s) Signed: 07/01/2022 5:34:04 PM By: [name] Entered By: [name] on 07/01/2022 08:07:35 -------------------------------------------------------------------------------- Patient/Caregiver Education Details Patient Name: Date of Service: WHITA KER, KA RO N W. 8/9/2023andnbsp8:00 A M Medical Record Number: [id number] Patient Account Number: [id number] Date of Birth/Gender: Treating RN: 06-07-1956 (66 y.o. [name] Primary Care Physician: [name] Other Clinician: Referring Physician: Treating Physician/Extender: [name] in Treatment: 2 Education Assessment Education Provided To: Patient Education Topics Provided Wound/Skin Impairment: Methods: Demonstration, Explain/Verbal, Printed Responses: State content correctly Electronic Signature(s) Signed: 07/01/2022 5:34:04 PM By: [name] Entered By: [name] on 07/01/2022 08:02:57 -------------------------------------------------------------------------------- Wound Assessment Details Patient Name: Date of Service: Kathryn [name], [name]. 07/01/2022  8:00 A M Medical Record Number: [id number] Patient Account Number: [id number] Date of Birth/Sex: Treating RN: 08/25/1956 (66 y.o. [name] Primary Care Dillard Pascal: [name] Other Clinician: Referring Dyshaun Bonzo: Treating Nadiyah Zeis/Extender: [name] in Treatment: 2 Wound Status Wound Number: 1 Primary Neuropathic Ulcer-Non Diabetic Etiology: Wound Location: Left T Fifth oe Wound Healed - Epithelialized Wounding Event: Blister Status: Date Acquired: 05/26/2022 Comorbid Cataracts, Arrhythmia, Coronary Artery Disease, Hypertension, Weeks Of Treatment: 2 History: Osteoarthritis, Neuropathy Clustered Wound: No Photos Wound Measurements Length: (cm) Width: (cm) Depth: (cm) Area: (cm) Volume: (cm) 0 % Reduction in Area: 100% 0 % Reduction in Volume: 100% 0 0 0 Wound Description Classification: Full Thickness Without Exposed Support Structu res Assessment Notes 07/13/22: Patient called and stated wound is healed. Treatment Notes Wound #1 (Toe Fifth) Wound Laterality: Left Cleanser Soap and Water Discharge Instruction: May shower and wash wound with dial antibacterial soap and water prior to dressing change. Peri-Wound Care Topical Primary Dressing KerraCel Ag Gelling Fiber Dressing, 2x2 in (silver alginate) Discharge Instruction: Apply silver alginate to wound bed as instructed Secondary Dressing Secured With Bandaid Compression Wrap Compression Stockings Add-Ons Electronic Signature(s) Signed: 07/13/2022 1:03:11 PM By: [name] Previous Signature: 07/01/2022 5:34:04 PM Version By: [name] Entered By: [name] on 07/13/2022 13:03:11 -------------------------------------------------------------------------------- Vitals Details Patient Name: Date of Service: Kathryn [name], [place] N W. 07/01/2022 8:00 A M Medical Record Number: [id number] Patient Account Number: [id number] Date of Birth/Sex: Treating RN: [birth year] (66  y.o. [name] Primary Care Spenser Harren: [name] Other Clinician: Referring Akeira Lahm: Treating Meshilem Machuca/Extender: [name] in Treatment: 2 Vital Signs Time Taken: 08:05 Temperature (F): 98.6 Height (in): 66 Pulse (bpm): 78 Weight (lbs): 212 Respiratory Rate (breaths/min): 18 Body Mass Index (BMI): 34.2 Blood Pressure (mmHg): 135/82 Reference Range: 80 - 120 mg / dl Electronic Signature(s) Signed: 07/01/2022 5:34:04 PM By: [name] Entered By: [name] on 07/01/2022 17:79:39

## 2022-07-08 DIAGNOSIS — H524 Presbyopia: Secondary | ICD-10-CM | POA: Diagnosis not present

## 2022-07-15 ENCOUNTER — Encounter (HOSPITAL_BASED_OUTPATIENT_CLINIC_OR_DEPARTMENT_OTHER): Payer: Medicare Other | Admitting: Physician Assistant

## 2022-07-25 DIAGNOSIS — E039 Hypothyroidism, unspecified: Secondary | ICD-10-CM | POA: Diagnosis not present

## 2022-07-25 DIAGNOSIS — D649 Anemia, unspecified: Secondary | ICD-10-CM | POA: Diagnosis not present

## 2022-07-25 DIAGNOSIS — E876 Hypokalemia: Secondary | ICD-10-CM | POA: Diagnosis not present

## 2022-07-25 DIAGNOSIS — R0902 Hypoxemia: Secondary | ICD-10-CM | POA: Diagnosis not present

## 2022-07-25 DIAGNOSIS — Z20822 Contact with and (suspected) exposure to covid-19: Secondary | ICD-10-CM | POA: Diagnosis not present

## 2022-07-25 DIAGNOSIS — I4891 Unspecified atrial fibrillation: Secondary | ICD-10-CM | POA: Diagnosis not present

## 2022-07-25 DIAGNOSIS — D689 Coagulation defect, unspecified: Secondary | ICD-10-CM | POA: Diagnosis not present

## 2022-07-25 DIAGNOSIS — R000 Tachycardia, unspecified: Secondary | ICD-10-CM | POA: Diagnosis not present

## 2022-07-25 DIAGNOSIS — I10 Essential (primary) hypertension: Secondary | ICD-10-CM | POA: Diagnosis not present

## 2022-07-25 DIAGNOSIS — Z7901 Long term (current) use of anticoagulants: Secondary | ICD-10-CM | POA: Diagnosis not present

## 2022-07-25 DIAGNOSIS — R55 Syncope and collapse: Secondary | ICD-10-CM | POA: Diagnosis not present

## 2022-07-25 DIAGNOSIS — I480 Paroxysmal atrial fibrillation: Secondary | ICD-10-CM | POA: Diagnosis not present

## 2022-07-25 DIAGNOSIS — R531 Weakness: Secondary | ICD-10-CM | POA: Diagnosis not present

## 2022-07-25 DIAGNOSIS — I499 Cardiac arrhythmia, unspecified: Secondary | ICD-10-CM | POA: Diagnosis not present

## 2022-08-05 ENCOUNTER — Telehealth: Payer: Self-pay | Admitting: *Deleted

## 2022-08-05 NOTE — Telephone Encounter (Signed)
This RN received call from pt stating she completed her 5 years of tamoxifen last May and was released from follow up in this office.  She states she has had " just a lot of trouble off and on the last few months "  She states [place] and [place] Fib - she proceeded to the ER where they noted her heme was 6.8. She received 2 units of blood.  She states it was 8.6 in July 2023.  This RN was able to pull up records per ER visit from [place]- including recommendation for pt to follow up with her primary MD for possible need for further work up and possible GI consult.  This RN asked if she has seen her primary MD ( Dr Forde Dandy ) she stated no- " but isn't Dr Chryl Heck a hematologist ?"  "When I called and spoke with his office they said maybe I should call you"  This RN stated Dr Chryl Heck is a hematologist- but due to her above episode with noted decrease in heme more rapidly - she needs work up first by primary MD- if he upon his review of her blood results needs to see [place] we can schedule an appointment.  Informed her this office does not do the "work up" that she may need for full evaluation.  This RN informed Becci again we are glad to see her per her evaluation with Dr Forde Dandy once he can assess her more completely.  Pt verbalized understanding of above.  Dr Chryl Heck did review labs from [place] and agreed above is most appropriate- and if per Dr Baldwin Crown evaluation need for pt to be seen here we are glad to accommodate.  This note will be forwarded to Dr Forde Dandy for review of call.

## 2022-08-06 ENCOUNTER — Telehealth: Payer: Self-pay | Admitting: Hematology and Oncology

## 2022-08-06 NOTE — Telephone Encounter (Signed)
Contacted patient to scheduled an appointment for her symptoms. Patient advised that she would follow up her pcp first and then would call back to schedule an appointment if neccessary.

## 2022-08-12 DIAGNOSIS — Z6835 Body mass index (BMI) 35.0-35.9, adult: Secondary | ICD-10-CM | POA: Diagnosis not present

## 2022-08-12 DIAGNOSIS — M5416 Radiculopathy, lumbar region: Secondary | ICD-10-CM | POA: Diagnosis not present

## 2022-08-12 DIAGNOSIS — G894 Chronic pain syndrome: Secondary | ICD-10-CM | POA: Diagnosis not present

## 2022-08-12 DIAGNOSIS — F1120 Opioid dependence, uncomplicated: Secondary | ICD-10-CM | POA: Diagnosis not present

## 2022-08-13 DIAGNOSIS — E039 Hypothyroidism, unspecified: Secondary | ICD-10-CM | POA: Diagnosis not present

## 2022-08-13 DIAGNOSIS — D509 Iron deficiency anemia, unspecified: Secondary | ICD-10-CM | POA: Diagnosis not present

## 2022-08-13 DIAGNOSIS — I700 Atherosclerosis of aorta: Secondary | ICD-10-CM | POA: Diagnosis not present

## 2022-08-13 DIAGNOSIS — I480 Paroxysmal atrial fibrillation: Secondary | ICD-10-CM | POA: Diagnosis not present

## 2022-08-20 DIAGNOSIS — Z961 Presence of intraocular lens: Secondary | ICD-10-CM | POA: Diagnosis not present

## 2022-08-20 DIAGNOSIS — H18593 Other hereditary corneal dystrophies, bilateral: Secondary | ICD-10-CM | POA: Diagnosis not present

## 2022-08-20 DIAGNOSIS — H26493 Other secondary cataract, bilateral: Secondary | ICD-10-CM | POA: Diagnosis not present

## 2022-09-10 DIAGNOSIS — K220 Achalasia of cardia: Secondary | ICD-10-CM | POA: Diagnosis not present

## 2022-09-10 DIAGNOSIS — D509 Iron deficiency anemia, unspecified: Secondary | ICD-10-CM | POA: Diagnosis not present

## 2022-09-10 DIAGNOSIS — K5900 Constipation, unspecified: Secondary | ICD-10-CM | POA: Diagnosis not present

## 2022-09-10 DIAGNOSIS — R1013 Epigastric pain: Secondary | ICD-10-CM | POA: Diagnosis not present

## 2022-09-11 DIAGNOSIS — D509 Iron deficiency anemia, unspecified: Secondary | ICD-10-CM | POA: Diagnosis not present

## 2022-09-14 ENCOUNTER — Other Ambulatory Visit (HOSPITAL_COMMUNITY): Payer: Self-pay

## 2022-09-14 MED ORDER — APIXABAN 5 MG PO TABS: 5.0000 mg | ORAL_TABLET | Freq: Two times a day (BID) | ORAL | 11 refills | Status: DC

## 2022-09-15 ENCOUNTER — Ambulatory Visit (HOSPITAL_COMMUNITY)
Admission: RE | Admit: 2022-09-15 | Discharge: 2022-09-15 | Disposition: A | Discharge status: Home / Self Care | Payer: Medicare Other | Source: Ambulatory Visit | Attending: Nurse Practitioner | Admitting: Nurse Practitioner

## 2022-09-15 ENCOUNTER — Encounter (HOSPITAL_COMMUNITY): Payer: Self-pay | Admitting: Nurse Practitioner

## 2022-09-15 VITALS — BP 150/92 | HR 77 | Ht 66.0 in | Wt 216.6 lb

## 2022-09-15 DIAGNOSIS — D649 Anemia, unspecified: Secondary | ICD-10-CM | POA: Insufficient documentation

## 2022-09-15 DIAGNOSIS — D6869 Other thrombophilia: Secondary | ICD-10-CM

## 2022-09-15 DIAGNOSIS — R0600 Dyspnea, unspecified: Secondary | ICD-10-CM | POA: Diagnosis not present

## 2022-09-15 DIAGNOSIS — Z7901 Long term (current) use of anticoagulants: Secondary | ICD-10-CM | POA: Insufficient documentation

## 2022-09-15 DIAGNOSIS — R002 Palpitations: Secondary | ICD-10-CM | POA: Insufficient documentation

## 2022-09-15 DIAGNOSIS — I480 Paroxysmal atrial fibrillation: Secondary | ICD-10-CM | POA: Insufficient documentation

## 2022-09-15 DIAGNOSIS — I10 Essential (primary) hypertension: Secondary | ICD-10-CM | POA: Diagnosis not present

## 2022-09-15 DIAGNOSIS — I4891 Unspecified atrial fibrillation: Secondary | ICD-10-CM

## 2022-09-15 MED ORDER — DILTIAZEM HCL 30 MG PO TABS: ORAL_TABLET | ORAL | 1 refills | Status: DC

## 2022-09-15 NOTE — Progress Notes (Signed)
Primary Care Physician: [name], MD Referring Physician: Dr. Leane Platt is a 66 y.o. female with a h/o palpitations that recently saw [name] for afib/HTN that was noted at time of scheduled endoscopy/colonoscopy. Of note, she did not take her BB dose prior to procedure that day. The procedure was cancelled. She was sent to the ER and she  left 2/2 long wait in the lobby. She was feeling improved. She saw Dr. Rayann Heman in f/u and he increased her BB and scheduled an echo which is pending tomorrow. He also started her on xarelto 20 mg daily for a CHA2DS2VASc score of 3. She has not had any issues with the change in  her meds. Colonoscopy/endoscopy was scheduled as she saw blood in her stool in the past but has not seen any with start of DOAC. She also has issues with food getting hung from  time to time.  She still feels minutes of palpitations but no extended periods of afib. She limits caffeine, no alcohol or tobacco and husband reports some snoring but no apnea. She is in rhythm today.   F/u in afib clinic, 05/13/21. She has only had one episode of afib last week, she took a Cardizem right away and it was over in 15 mins. She has not had her endo/colonoscopy rescheduled yet. She went on to have repeat back surgery in May.  She states that ever since she started having back surgery 3 years ago, she has not been able to walk very long distances and has noted exertional dyspnea with this. No chest discomfort. Unchanged over the last 3 years. Very sedentary. No issues with blood thinner.   F/u in afib clinic, 03/17/22. She has not felt any issues with irregular heart beat but feels lightheaded at times. Sometimes  with position change, sometimes walking around. She is on HCTZ, admitted she probably does not good water intake.    F/u in afib clinic, 09/15/22. She reports that when she was [place] the beach in [place], her heart started to beat rapidly and she became presyncopal. EMS was  called and she taken to a hospital near [facility]. She was in rapid afib but she was also  found to be profoundly anemic with a hemoglobin around 6. She was transfused and ultimately left the ER in [place]. Her blood count has dropped again and she is  in the middle of  GI w/o for this. She does not notice any hematuria or blood in stool. She is pending an endoscopy soon, because of the anemia, she is very tired and winded with exertion. Intermittent pedal edema.   Today, she denies symptoms of palpitations, chest pain, shortness of breath, orthopnea, PND, lower extremity edema, dizziness, presyncope, syncope, or neurologic sequela. The patient is tolerating medications without difficulties and is otherwise without complaint today.   Past Medical History:  Diagnosis Date   Anxiety    Breast cancer ([place])    Cancer ([place]) 10/2016   Breast right,  lumpectomy,  did not require chemo/xrt   DDD (degenerative disc disease), cervical    Depression    Dyspnea    Dysrhythmia    A Fib   Elevated cholesterol    GERD (gastroesophageal reflux disease)    H/O hiatal hernia    Hypertension    Hypothyroidism    Paroxysmal atrial fibrillation ([place]) 11/20/2020   Tremor    patient states "tremor has gotten better" on 06/30/19   Wears partial dentures  upper   Past Surgical History:  Procedure Laterality Date   ABDOMINAL HYSTERECTOMY  yrs ago   ANTERIOR LAT LUMBAR FUSION N/A 11/21/2019   Procedure: Lumbar two-three Lumbar three-four Anterolateral lumbar interbody fusion with lateral plate;  Surgeon: Kristeen Miss, MD;  Location: [place];  Service: Neurosurgery;  Laterality: N/A;   BREAST EXCISIONAL BIOPSY Right 11/2016   ALH and papillomas   BREAST LUMPECTOMY Right 2017   BREAST LUMPECTOMY WITH RADIOACTIVE SEED LOCALIZATION Right 12/15/2016   Procedure: RADIOACTIVE SEED X 2 GUIDED EXCISIONAL BREAST BIOPSY;  Surgeon: Rolm Bookbinder, MD;  Location: [facility];  Service: General;  Laterality:  Right;   CARPAL TUNNEL RELEASE Bilateral yrs ago   x 2   cervical neck fusion  yrs ago   C 3 and C 4    COLONOSCOPY     ESOPHAGOGASTRODUODENOSCOPY ENDOSCOPY N/A 01/09/2022   Procedure: INTRAOPERATIVE ENDOSCOPY;  Surgeon: Ralene Ok, MD;  Location: [facility] OR;  Service: General;  Laterality: N/A;   EYE SURGERY Bilateral    Cataracts removed   left knee arthroscopy   last done 2008   x 3   left shoulder arthroscopy  yrs ago   lower back surgery  2005   right total knee replacement   2008   TOTAL KNEE ARTHROPLASTY  01/14/2012   Procedure: TOTAL KNEE ARTHROPLASTY;  Surgeon: Johnn Hai, MD;  Location: [facility];  Service: Orthopedics;  Laterality: Left;  preop femoral nerve block   UPPER GI ENDOSCOPY     WISDOM TOOTH EXTRACTION      Current Outpatient Medications  Medication Sig Dispense Refill   apixaban (ELIQUIS) 5 MG TABS tablet Take 1 tablet (5 mg total) by mouth 2 (two) times daily. 60 tablet 11   Cholecalciferol (VITAMIN D3) 25 MCG (1000 UT) CAPS 1 capsule Orally Once a day     colchicine 0.6 MG tablet Take 0.6 mg by mouth as needed.     DULoxetine (CYMBALTA) 30 MG capsule Take 30 mg by mouth daily.     DULoxetine (CYMBALTA) 60 MG capsule Take 60 mg by mouth See admin instructions. Take with 30 mg to equal 90 mg at night     FERREX 150 150 MG capsule Take 150 mg by mouth 2 (two) times daily.     furosemide (LASIX) 20 MG tablet Take 1 tablet (20 mg total) by mouth daily as needed. 30 tablet 1   gabapentin (NEURONTIN) 300 MG capsule Take 1 capsule by mouth See admin instructions. Take 300 mg twice daily, may take a third 300 mg dose as needed for pain     gabapentin (NEURONTIN) 600 MG tablet Take 600 mg by mouth 3 (three) times daily.     hydrochlorothiazide (HYDRODIURIL) 25 MG tablet Take 1 tablet (25 mg total) by mouth daily. 30 tablet 11   levothyroxine (SYNTHROID) 200 MCG tablet TAKE 1 TABLET BY MOUTH ONCE DAILY for 90     methocarbamol (ROBAXIN) 500 MG tablet Take 1 tablet  (500 mg total) by mouth every 6 (six) hours as needed for muscle spasms. 40 tablet 3   metoprolol succinate (TOPROL-XL) 100 MG 24 hr tablet TAKE 1 TABLET BY MOUTH DAILY WITH OR IMMEDIATELY FOLLOWING A MEAL 90 tablet 2   omeprazole (PRILOSEC) 20 MG capsule Take 20 mg by mouth 2 (two) times daily.     oxyCODONE-acetaminophen (PERCOCET) 7.5-325 MG tablet Take 1 tablet by mouth every 8 (eight) hours as needed.     diltiazem (CARDIZEM) 30 MG tablet  Take 1 tablet every 4 hours AS NEEDED for heart rate >100 as long as blood pressure >100. 30 tablet 1   No current facility-administered medications for this encounter.    No Known Allergies   Social History   Socioeconomic History   Marital status: Married    Spouse name: Not on file   Number of children: Not on file   Years of education: Not on file   Highest education level: Not on file  Occupational History   Not on file  Tobacco Use   Smoking status: Never   Smokeless tobacco: Never  Vaping Use   Vaping Use: Never used  Substance and Sexual Activity   Alcohol use: Yes    Comment: occasional wine   Drug use: No   Sexual activity: Not on file    Comment: Hysterectormy  Other Topics Concern   Not on file  Social History Narrative   Lives in [place] with spouse.   Retired Nurse, learning disability   Social Determinants of Radio broadcast assistant Strain: Not on file  Food Insecurity: Not on file  Transportation Needs: Not on file  Physical Activity: Not on file  Stress: Not on file  Social Connections: Not on file  Intimate Partner Violence: Not on file    Family History  Problem Relation Age of Onset   Breast cancer Neg Hx     ROS- All systems are reviewed and negative except as per the HPI above  Physical Exam: Vitals:   09/15/22 0946  BP: (!) 150/92  Pulse: 77  Weight: 98.2 kg  Height: '5\' 6"'$  (1.676 m)   Wt Readings from Last 3 Encounters:  09/15/22 98.2 kg  05/21/22 90.7 kg  04/03/22 96.5 kg     Labs: Lab Results  Component Value Date   NA 138 06/08/2022   K 3.8 06/08/2022   CL 94 (L) 06/08/2022   CO2 31 06/08/2022   GLUCOSE 74 06/08/2022   BUN 16 06/08/2022   CREATININE 1.14 (H) 06/08/2022   CALCIUM 9.4 06/08/2022   Lab Results  Component Value Date   INR 0.91 01/05/2012   No results found for: "CHOL", "HDL", "LDLCALC", "TRIG"   GEN- The patient is well appearing, alert and oriented x 3 today.   Head- normocephalic, atraumatic Eyes-  Sclera clear, conjunctiva pink Ears- hearing intact Oropharynx- clear Neck- supple, no JVP Lymph- no cervical lymphadenopathy Lungs- Clear to ausculation bilaterally, normal work of breathing Heart- Regular rate and rhythm, no murmurs, rubs or gallops, PMI not laterally displaced GI- soft, NT, ND, + BS Extremities- no clubbing, cyanosis, or edema MS- no significant deformity or atrophy Skin- no rash or lesion Psych- euthymic mood, full affect Neuro- strength and sensation are intact  Ekg- Vent. rate 77 BPM PR interval 168 ms QRS duration 94 ms QT/QTcB 404/457 ms P-R-T axes 18 21 44 Sinus rhythm with occasional Premature ventricular complexes Nonspecific ST and T wave abnormality Abnormal ECG When compared with ECG of 17-Mar-2022 14:14, PREVIOUS ECG IS PRESENT  Echo -. Left ventricular ejection fraction, by estimation, is 55 to 60%. The  left ventricle has normal function. The left ventricle has no regional  wall motion abnormalities. There is moderate left ventricular hypertrophy.  Left ventricular diastolic  parameters are consistent with Grade II diastolic dysfunction  (pseudonormalization).   2. Right ventricular systolic function is normal. The right ventricular  size is normal. Tricuspid regurgitation signal is inadequate for assessing  PA pressure.   3. Left  atrial size was mildly dilated.   4. The mitral valve is normal in structure. Trivial mitral valve  regurgitation. No evidence of mitral stenosis.   5.  The aortic valve is abnormal. There is moderate calcification of the  aortic valve. Aortic valve regurgitation is not visualized. Mild aortic  valve sclerosis is present, with no evidence of aortic valve stenosis.   6. The inferior vena cava is normal in size with greater than 50%  respiratory variability, suggesting right atrial pressure of 3 mmHg.   Epic records reviewed   Assessment and Plan: 1. Paroxysmal afib Low burden. Has not been aware of any heart irregularities since September in the setting of profound anemia  Continue Toprol 100 mg qd RX 30 mg cardizem to use for breakthrough afib   2. CHA2DS2VASc score of 3 Continue eliquis 5 mg bid, changed form xarelto at time of dx of anemia in September  No specific bleeding site identified so far so she has not been told to hold drug  3. Exertional dyspnea/ decreased exercise tolerance  Somewhat chronic but worsened recently secondary to anemia Continue current w/u with GI   F/u afib clinic in 6 months   Kathryn [name], [facility] [address] [place], [place] [address] [phone]

## 2022-09-18 DIAGNOSIS — M5116 Intervertebral disc disorders with radiculopathy, lumbar region: Secondary | ICD-10-CM | POA: Diagnosis not present

## 2022-09-18 DIAGNOSIS — I480 Paroxysmal atrial fibrillation: Secondary | ICD-10-CM | POA: Diagnosis not present

## 2022-09-18 DIAGNOSIS — M79605 Pain in left leg: Secondary | ICD-10-CM | POA: Diagnosis not present

## 2022-09-18 DIAGNOSIS — M79604 Pain in right leg: Secondary | ICD-10-CM | POA: Diagnosis not present

## 2022-09-22 DIAGNOSIS — D509 Iron deficiency anemia, unspecified: Secondary | ICD-10-CM | POA: Diagnosis not present

## 2022-09-22 DIAGNOSIS — R1310 Dysphagia, unspecified: Secondary | ICD-10-CM | POA: Diagnosis not present

## 2022-09-22 DIAGNOSIS — K2980 Duodenitis without bleeding: Secondary | ICD-10-CM | POA: Diagnosis not present

## 2022-09-22 DIAGNOSIS — K259 Gastric ulcer, unspecified as acute or chronic, without hemorrhage or perforation: Secondary | ICD-10-CM | POA: Diagnosis not present

## 2022-09-22 DIAGNOSIS — K2289 Other specified disease of esophagus: Secondary | ICD-10-CM | POA: Diagnosis not present

## 2022-09-22 DIAGNOSIS — Z980 Intestinal bypass and anastomosis status: Secondary | ICD-10-CM | POA: Diagnosis not present

## 2022-09-22 DIAGNOSIS — K2930 Chronic superficial gastritis without bleeding: Secondary | ICD-10-CM | POA: Diagnosis not present

## 2022-09-27 IMAGING — RF DG LUMBAR SPINE 2-3V
1 series · 7 of 7 positions shown · non-contrast
Comparison: Lumbar CT myelogram 01/08/2020.

CLINICAL DATA: Surgery, elective. Additional history provided:
Lumbar 5 sacral 1 posterior lumbar interbody fusion with pedicle
fixation to pelvis. Provided fluoroscopy time 26 seconds (22.46
mGy).

EXAM:
LUMBAR SPINE - 2-3 VIEW; DG C-ARM 1-60 MIN

[Series 1: run · 7 of 7 slices shown]
[im 1/7]
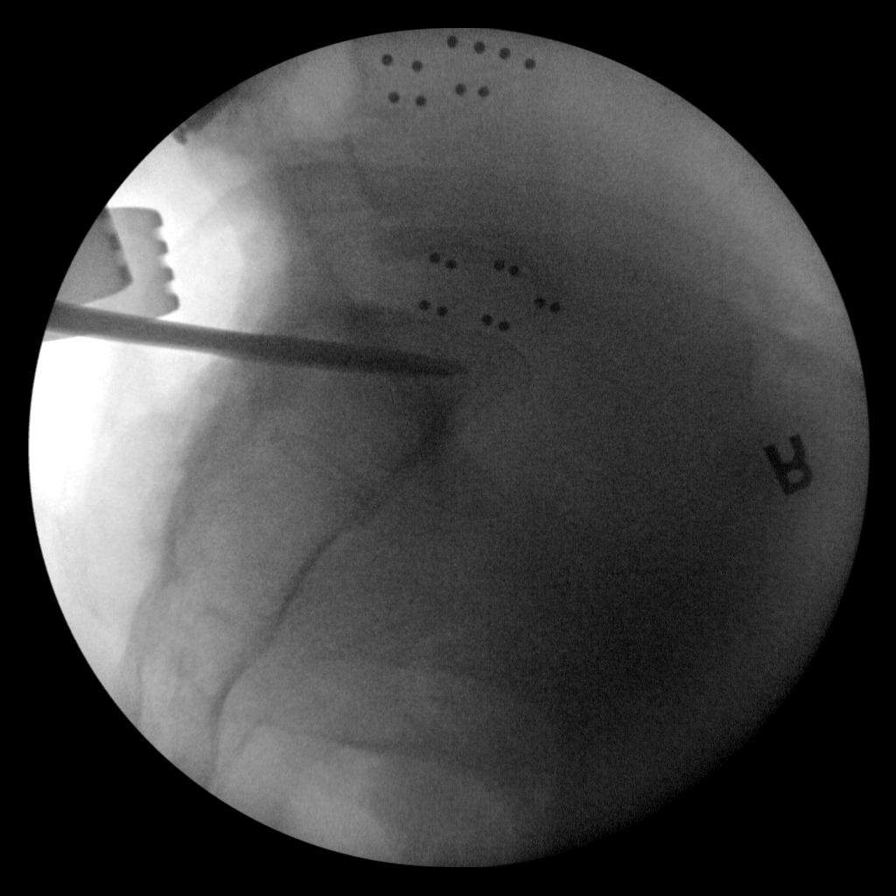
[im 2/7]
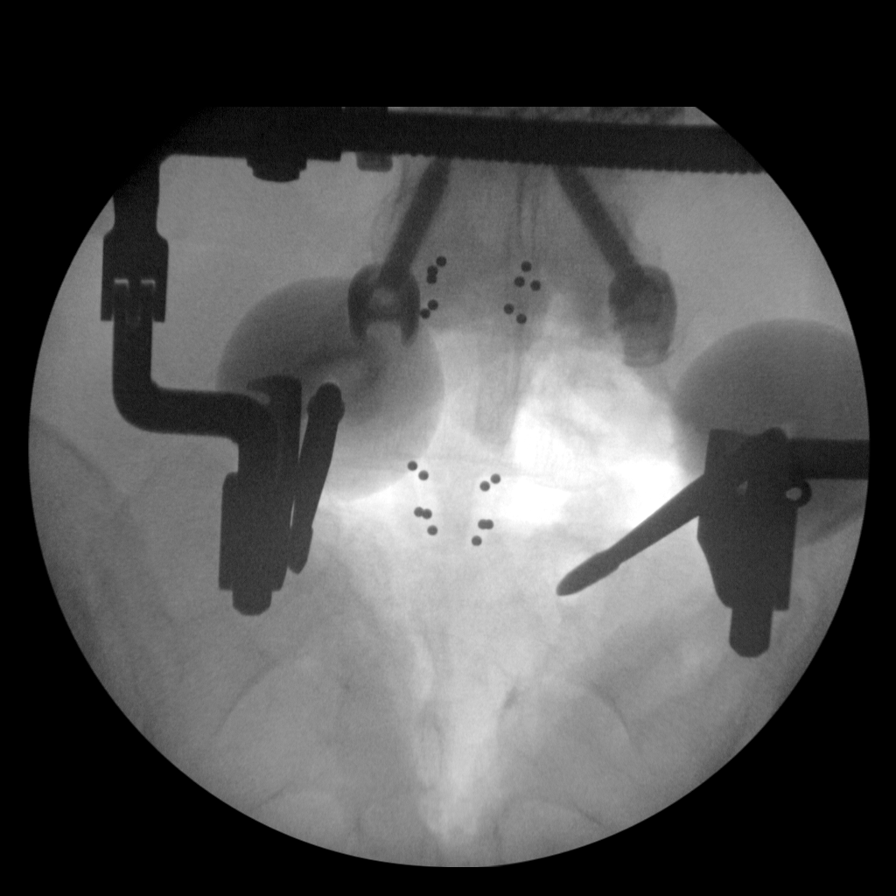
[im 3/7]
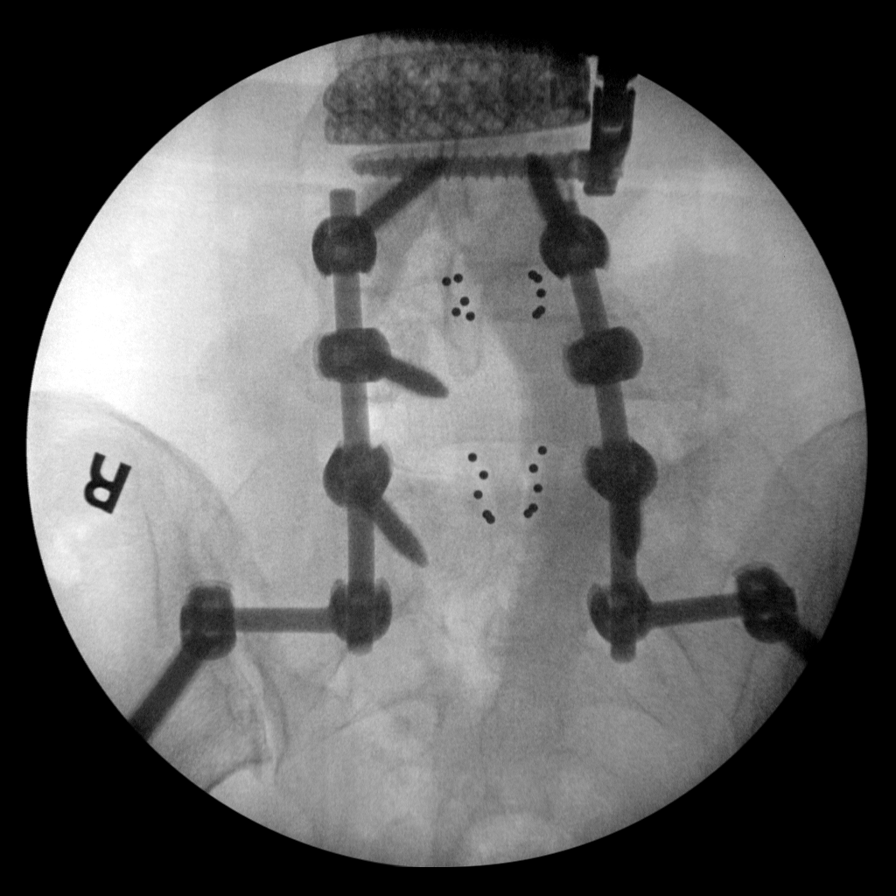
[im 4/7]
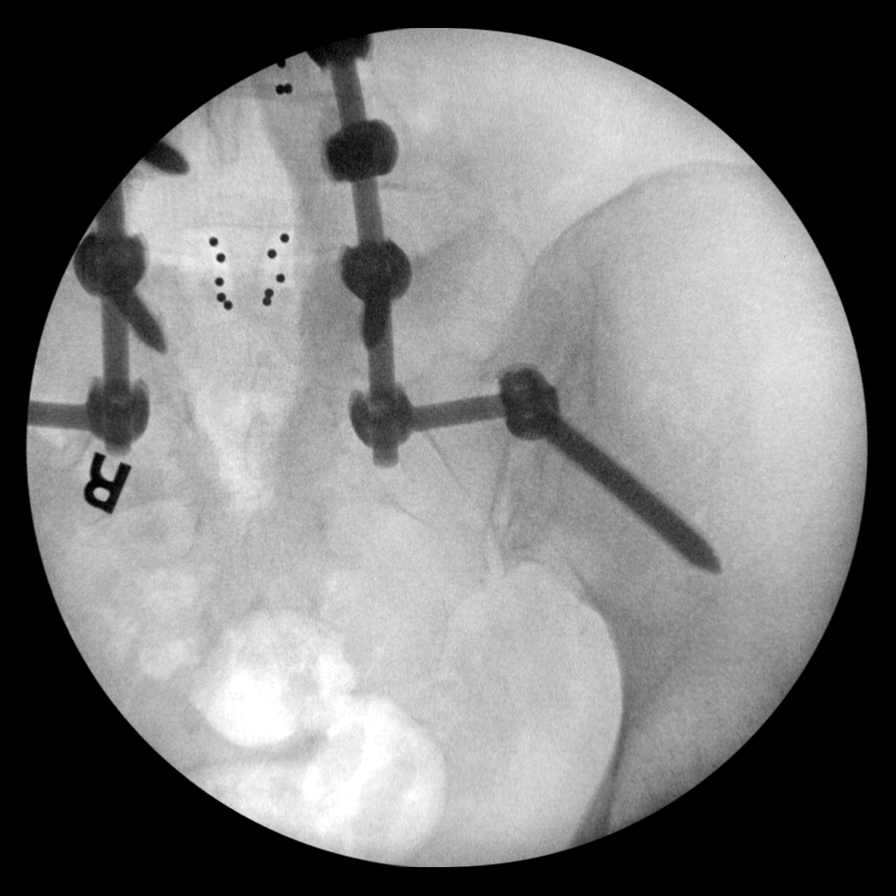
[im 5/7]
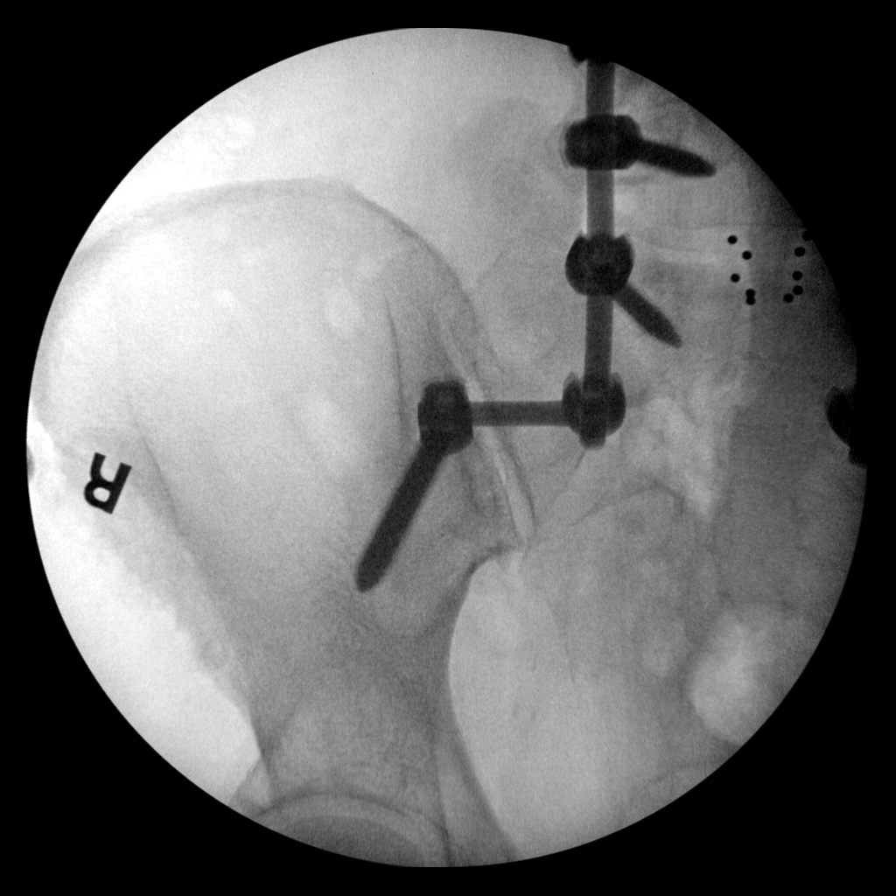
[im 6/7]
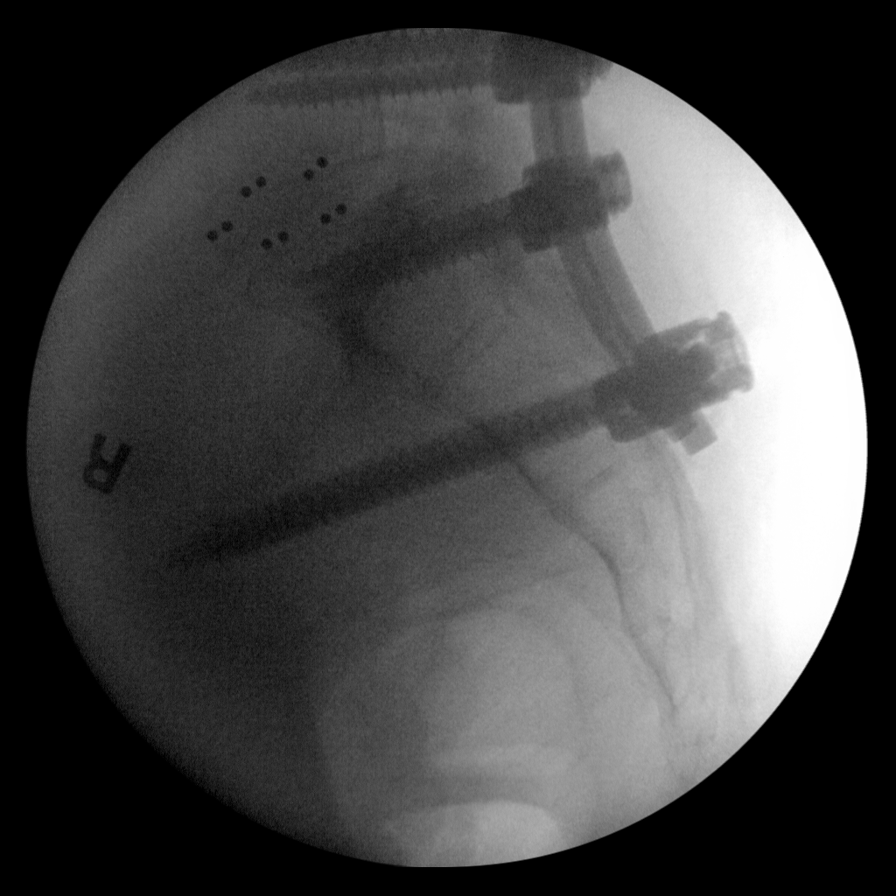
[im 7/7]
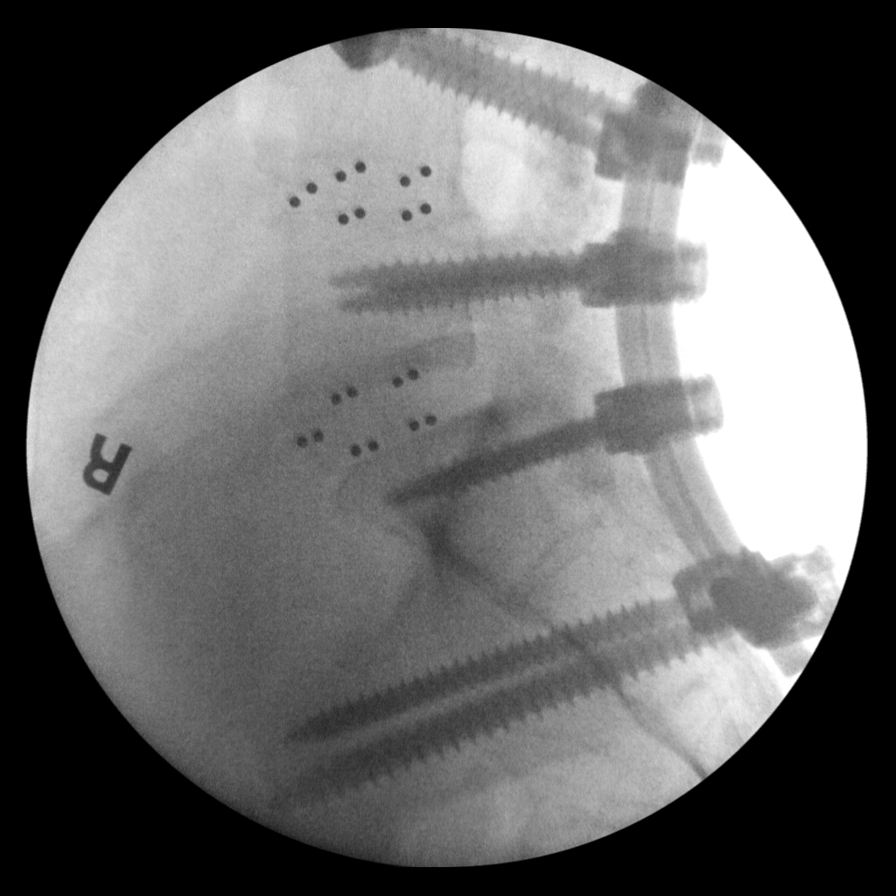

[7 of 7 positions shown; findings below may reference images not displayed]

FINDINGS: AP and lateral view intraoperative fluoroscopic images of the
lumbosacral spine and pelvis are submitted, 7 images total. On the
most recent provided images, a posterior spinal fusion construct
(bilateral pedicle screws and vertical interconnecting rods) now
spans the L4-S1 levels. Additionally, components extend from this
posterior spinal fusion construct to screws within the bilateral
iliac bones. Interbody devices at L4-L5 and L5-S1. Partially imaged
lateral fixation hardware and interbody device at L3-L4.
IMPRESSION: Seven intraoperative fluoroscopic images of the lumbosacral spine
and pelvis, as described.

## 2022-09-29 DIAGNOSIS — K2980 Duodenitis without bleeding: Secondary | ICD-10-CM | POA: Diagnosis not present

## 2022-09-29 DIAGNOSIS — K2930 Chronic superficial gastritis without bleeding: Secondary | ICD-10-CM | POA: Diagnosis not present

## 2022-10-08 ENCOUNTER — Inpatient Hospital Stay: Payer: Medicare Other | Attending: Adult Health | Admitting: Adult Health

## 2022-10-08 ENCOUNTER — Telehealth: Payer: Self-pay | Admitting: Adult Health

## 2022-10-08 NOTE — Telephone Encounter (Signed)
TC from Pt stating she had an appointment scheduled today with Mendel Ryder and didn't; know why it was scheduled. Returned call to inform Pt that this appointment was scheduled in May and looking in the notes she decided to go to her PCP for the problem. Left a message to return call to the office.

## 2022-10-20 ENCOUNTER — Telehealth: Payer: Self-pay | Admitting: Cardiology

## 2022-10-20 NOTE — Telephone Encounter (Signed)
Pt c/o of Chest Pain: STAT if CP now or developed within 24 hours  1. Are you having CP right now? No  2. Are you experiencing any other symptoms (ex. SOB, nausea, vomiting, sweating)? SOB  3. How long have you been experiencing CP?  Few days  4. Is your CP continuous or coming and going? Come and go  5. Have you taken Nitroglycerin? No ? Husband states that pt has SOB when laying down

## 2022-10-20 NOTE — Telephone Encounter (Signed)
Called patient's husband (DPR) back. Patient is complaining about just not feeling right, burning sensation in legs, SOB when laying down, and chest pain that comes and goes. Tried to get patient in to see an APP, husband is refusing to have patient see anyone other than Dr. Quentin Ore. Patient is scheduled to see Dr. Quentin Ore  on 12/07/22. Informed patient's husband that patient needs to see someone sooner than the appointment that is scheduled. He stated he would call back if her symptoms get worse. Will see if there are any sooner appointments available with Dr. Quentin Ore.

## 2022-11-02 DIAGNOSIS — G894 Chronic pain syndrome: Secondary | ICD-10-CM | POA: Diagnosis not present

## 2022-11-02 DIAGNOSIS — Z6834 Body mass index (BMI) 34.0-34.9, adult: Secondary | ICD-10-CM | POA: Diagnosis not present

## 2022-11-02 DIAGNOSIS — F1120 Opioid dependence, uncomplicated: Secondary | ICD-10-CM | POA: Diagnosis not present

## 2022-11-24 ENCOUNTER — Other Ambulatory Visit: Payer: Self-pay | Admitting: *Deleted

## 2022-11-24 DIAGNOSIS — M79606 Pain in leg, unspecified: Secondary | ICD-10-CM

## 2022-11-25 ENCOUNTER — Ambulatory Visit (HOSPITAL_COMMUNITY)
Admission: RE | Admit: 2022-11-25 | Discharge: 2022-11-25 | Disposition: A | Discharge status: Home / Self Care | Payer: Medicare Other | Source: Ambulatory Visit | Attending: Vascular Surgery | Admitting: Vascular Surgery

## 2022-11-25 DIAGNOSIS — M79606 Pain in leg, unspecified: Secondary | ICD-10-CM | POA: Diagnosis not present

## 2022-11-26 ENCOUNTER — Ambulatory Visit: Payer: Medicare Other | Admitting: Vascular Surgery

## 2022-11-26 ENCOUNTER — Encounter: Payer: Self-pay | Admitting: Vascular Surgery

## 2022-11-26 VITALS — BP 125/66 | HR 74 | Temp 97.9°F | Resp 20 | Ht 66.0 in | Wt 215.0 lb

## 2022-11-26 DIAGNOSIS — M25561 Pain in right knee: Secondary | ICD-10-CM | POA: Diagnosis not present

## 2022-11-26 DIAGNOSIS — M25562 Pain in left knee: Secondary | ICD-10-CM | POA: Diagnosis not present

## 2022-11-26 NOTE — Progress Notes (Signed)
ASSESSMENT & PLAN   BILATERAL LEG PAIN: This patient has significant pain in both thighs which is worse when she is lying flat.  It is better when she is sitting or standing.  I do not get any history of claudication or rest pain.  She has no evidence of arterial insufficiency.  She has palpable femoral, popliteal, and pedal pulses bilaterally.  She has normal Doppler signals in both feet.  She has normal ABIs and normal toe pressures.  Thus I do not think she has any arterial insufficiency which would explain her symptoms.  Likewise, she does not have any evidence of venous disease.  In addition her symptoms do not fit with symptoms from venous hypertension.  The patient has had previous back surgery and the symptoms sound potentially neurogenic in origin.  She is also having some paresthesias in her feet.  She will arrange to follow-up with Dr. Ellene Route who was previously worked on her back.  I will be happy to see her back at any time if any new vascular issues arise.  REASON FOR CONSULT:    Pain in legs from hips to knees.  Symptoms are worse on the right.  Consult is requested by Dr. [name].  HPI:   Kathryn [name] is a 67 y.o. female who is referred with leg pain.  I have reviewed the records from the referring office.  The patient was seen on 09/18/2022.  She was having extreme pain in her legs and she felt that "calcium was building up in her legs.".  This had been going on for 3 to 4 months.  It has been gradually getting worse.  She does have a history of aortoiliac atherosclerosis.  She is sent for vascular consultation.  Of note she has previously had back surgery.  She had previous discectomy and fusion from L2 to the sacrum.  On my history, the patient developed thigh pain bilaterally that came on gradually in the summer 2023.  The symptoms are more significant on the left side.  She experiences significant pain in her legs when she is lying flat.  This affects her to the point  where she cannot sleep.  Her symptoms are a little bit better when she is sitting or standing.  I do not get any history of claudication, rest pain, or nonhealing ulcers.  She does not have any hip claudication.  Her risk factors for peripheral arterial disease include hypertension and hypercholesterolemia.  She denies any history of diabetes, family history of premature cardiovascular disease, or tobacco use.  She denies any previous history of DVT.  She is on Eliquis for atrial fibrillation.  She also complains of some knots in both of her legs.  Past Medical History:  Diagnosis Date   Anxiety    Breast cancer ([place])    Cancer ([place]) 10/2016   Breast right,  lumpectomy,  did not require chemo/xrt   DDD (degenerative disc disease), cervical    Depression    Dyspnea    Dysrhythmia    A Fib   Elevated cholesterol    GERD (gastroesophageal reflux disease)    H/O hiatal hernia    Hypertension    Hypothyroidism    Paroxysmal atrial fibrillation ([place]) 11/20/2020   Tremor    patient states "tremor has gotten better" on 06/30/19   Wears partial dentures    upper    Family History  Problem Relation Age of Onset   Breast cancer Neg Hx  SOCIAL HISTORY: Social History   Tobacco Use   Smoking status: Never   Smokeless tobacco: Never  Substance Use Topics   Alcohol use: Yes    Comment: occasional wine    No Known Allergies  Current Outpatient Medications  Medication Sig Dispense Refill   apixaban (ELIQUIS) 5 MG TABS tablet Take 1 tablet (5 mg total) by mouth 2 (two) times daily. 60 tablet 11   Cholecalciferol (VITAMIN D3) 25 MCG (1000 UT) CAPS 1 capsule Orally Once a day     colchicine 0.6 MG tablet Take 0.6 mg by mouth as needed.     diltiazem (CARDIZEM) 30 MG tablet Take 1 tablet every 4 hours AS NEEDED for heart rate >100 as long as blood pressure >100. 30 tablet 1   DULoxetine (CYMBALTA) 30 MG capsule Take 30 mg by mouth daily.     DULoxetine (CYMBALTA) 60 MG capsule  Take 60 mg by mouth See admin instructions. Take with 30 mg to equal 90 mg at night     FERREX 150 150 MG capsule Take 150 mg by mouth 2 (two) times daily.     furosemide (LASIX) 20 MG tablet Take 1 tablet (20 mg total) by mouth daily as needed. 30 tablet 1   gabapentin (NEURONTIN) 600 MG tablet Take 600 mg by mouth 3 (three) times daily.     hydrochlorothiazide (HYDRODIURIL) 25 MG tablet Take 1 tablet (25 mg total) by mouth daily. 30 tablet 11   levothyroxine (SYNTHROID) 200 MCG tablet TAKE 1 TABLET BY MOUTH ONCE DAILY for 90     methocarbamol (ROBAXIN) 500 MG tablet Take 1 tablet (500 mg total) by mouth every 6 (six) hours as needed for muscle spasms. 40 tablet 3   metoprolol succinate (TOPROL-XL) 100 MG 24 hr tablet TAKE 1 TABLET BY MOUTH DAILY WITH OR IMMEDIATELY FOLLOWING A MEAL 90 tablet 2   omeprazole (PRILOSEC) 20 MG capsule Take 20 mg by mouth 2 (two) times daily.     oxyCODONE-acetaminophen (PERCOCET) 7.5-325 MG tablet Take 1 tablet by mouth every 8 (eight) hours as needed.     No current facility-administered medications for this visit.    REVIEW OF SYSTEMS:  '[X]'$  denotes positive finding, '[ ]'$  denotes negative finding Cardiac  Comments:  Chest pain or chest pressure:    Shortness of breath upon exertion:    Short of breath when lying flat:    Irregular heart rhythm:        Vascular    Pain in calf, thigh, or hip brought on by ambulation:    Pain in feet at night that wakes you up from your sleep:     Blood clot in your veins:    Leg swelling:         Pulmonary    Oxygen at home:    Productive cough:     Wheezing:         Neurologic    Sudden weakness in arms or legs:     Sudden numbness in arms or legs:     Sudden onset of difficulty speaking or slurred speech:    Temporary loss of vision in one eye:     Problems with dizziness:         Gastrointestinal    Blood in stool:     Vomited blood:         Genitourinary    Burning when urinating:     Blood in urine:         Psychiatric  Major depression:         Hematologic    Bleeding problems:    Problems with blood clotting too easily:        Skin    Rashes or ulcers:        Constitutional    Fever or chills:    -  PHYSICAL EXAM:   Vitals:   11/26/22 1041  BP: 125/66  Pulse: 74  Resp: 20  Temp: 97.9 F (36.6 C)  SpO2: 92%  Weight: 215 lb (97.5 kg)  Height: '5\' 6"'$  (1.676 m)   Body mass index is 34.7 kg/m. GENERAL: The patient is a well-nourished female, in no acute distress. The vital signs are documented above. CARDIAC: There is a regular rate and rhythm.  VASCULAR: I do not detect carotid bruits. She has palpable femoral, popliteal, dorsalis pedis, and posterior tibial pulses bilaterally. She has no significant lower extremity swelling and no hyperpigmentation to suggest chronic venous insufficiency. PULMONARY: There is good air exchange bilaterally without wheezing or rales. ABDOMEN: Soft and non-tender with normal pitched bowel sounds.  MUSCULOSKELETAL: There are no major deformities. NEUROLOGIC: No focal weakness or paresthesias are detected. SKIN: There are no ulcers or rashes noted. PSYCHIATRIC: The patient has a normal affect.  DATA:    ARTERIAL DOPPLER STUDY: I have reviewed her arterial Doppler study that was done yesterday.  On the right side there was a multiphasic posterior tibial signal and dorsalis pedis signal.  ABI was 100%.  Toe pressure was 119 mmHg.  On the left side there was a multiphasic dorsalis pedis and posterior tibial signal.  ABI was 100%.  Toe pressures 137 mmHg.  LABS: I reviewed the labs from 08/13/2022.  Creatinine was 0.8.  GFR was 71.  Hemoglobin was 8.9.  Hematocrit 27.2.  Platelets 327,000.  Deitra Mayo [facility]

## 2022-12-02 ENCOUNTER — Ambulatory Visit: Payer: Medicare Other | Admitting: Cardiology

## 2022-12-06 NOTE — Progress Notes (Unsigned)
Electrophysiology Office Follow up Visit Note:    Date:  12/06/2022   ID:  Kathryn [name], DOB [birth year], MRN [id number]  PCP:  Kathryn Bowen, MD  [facility] Cardiologist:  None  [facility] Electrophysiologist:  Kathryn Epley, MD    Interval History:    Kathryn [name] is a 67 y.o. female who presents for a follow up visit.  She was previously seen by Dr. Rayann [name].  She saw Kathryn [name] in [place] clinic on March 17, 2022.  She is on Xarelto for stroke prophylaxis.  At the appointment Kathryn [name] she reported a low burden of atrial fibrillation.  She saw Kathryn [name] again September 15, 2022 and was again doing well.  She reported an exertional dyspnea and reduced exercise tolerance that she thought was potentially related to an anemia.  Her last hemoglobin was 8.6 in July 2023.    Past Medical History:  Diagnosis Date   Anxiety    Breast cancer ([place])    Cancer ([place]) 10/2016   Breast right,  lumpectomy,  did not require chemo/xrt   DDD (degenerative disc disease), cervical    Depression    Dyspnea    Dysrhythmia    A Fib   Elevated cholesterol    GERD (gastroesophageal reflux disease)    H/O hiatal hernia    Hypertension    Hypothyroidism    Paroxysmal atrial fibrillation ([place]) 11/20/2020   Tremor    patient states "tremor has gotten better" on 06/30/19   Wears partial dentures    upper    Past Surgical History:  Procedure Laterality Date   ABDOMINAL HYSTERECTOMY  yrs ago   ANTERIOR LAT LUMBAR FUSION N/A 11/21/2019   Procedure: Lumbar two-three Lumbar three-four Anterolateral lumbar interbody fusion with lateral plate;  Surgeon: Kathryn Miss, MD;  Location: [place];  Service: Neurosurgery;  Laterality: N/A;   BREAST EXCISIONAL BIOPSY Right 11/2016   ALH and papillomas   BREAST LUMPECTOMY Right 2017   BREAST LUMPECTOMY WITH RADIOACTIVE SEED LOCALIZATION Right 12/15/2016   Procedure: RADIOACTIVE SEED X 2 GUIDED EXCISIONAL BREAST BIOPSY;  Surgeon: Rolm Bookbinder, MD;  Location:  [place];  Service: General;  Laterality: Right;   CARPAL TUNNEL RELEASE Bilateral yrs ago   x 2   cervical neck fusion  yrs ago   C 3 and C 4    COLONOSCOPY     ESOPHAGOGASTRODUODENOSCOPY ENDOSCOPY N/A 01/09/2022   Procedure: INTRAOPERATIVE ENDOSCOPY;  Surgeon: Kathryn Ok, MD;  [facility];  Service: General;  Laterality: N/A;   EYE SURGERY Bilateral    Cataracts removed   left knee arthroscopy   last done 2008   x 3   left shoulder arthroscopy  yrs ago   lower back surgery  2005   right total knee replacement   2008   TOTAL KNEE ARTHROPLASTY  01/14/2012   Procedure: TOTAL KNEE ARTHROPLASTY;  Surgeon: Kathryn Hai, MD;  Location: [facility];  Service: Orthopedics;  Laterality: Left;  preop femoral nerve block   UPPER GI ENDOSCOPY     WISDOM TOOTH EXTRACTION      Current Medications: No outpatient medications have been marked as taking for the 12/07/22 encounter (Appointment) with Kathryn Epley, MD.     Allergies:   Patient has no known allergies.   Social History   Socioeconomic History   Marital status: Married    Spouse name: Not on file   Number of children: Not on file   Years of education: Not on  file   Highest education level: Not on file  Occupational History   Not on file  Tobacco Use   Smoking status: Never   Smokeless tobacco: Never  Vaping Use   Vaping Use: Never used  Substance and Sexual Activity   Alcohol use: Yes    Comment: occasional wine   Drug use: No   Sexual activity: Not on file    Comment: Hysterectormy  Other Topics Concern   Not on file  Social History Narrative   Lives in [place] with spouse.   Retired Nurse, learning disability   Social Determinants of Radio broadcast assistant Strain: Not on [employer] Insecurity: Not on file  Transportation Needs: Not on file  Physical Activity: Not on file  Stress: Not on file  Social Connections: Not on file     Family History: The patient's family history  is negative for Breast cancer.  ROS:   Please see the history of present illness.    All other systems reviewed and are negative.  EKGs/Labs/Other Studies Reviewed:    The following studies were reviewed today:     Recent Labs: 06/08/2022: ALT 8; BUN 16; Creat 1.14; Hemoglobin 8.6; Platelets 306; Potassium 3.8; Sodium 138  Recent Lipid Panel No results found for: "CHOL", "TRIG", "HDL", "CHOLHDL", "VLDL", "LDLCALC", "LDLDIRECT"  Physical Exam:    VS:  LMP  (LMP Unknown)     Wt Readings from Last 3 Encounters:  11/26/22 215 lb (97.5 kg)  09/15/22 216 lb 9.6 oz (98.2 kg)  05/21/22 200 lb (90.7 kg)     GEN: *** Well nourished, well developed in no acute distress CARDIAC: ***RRR, no murmurs, rubs, gallops RESPIRATORY:  Clear to auscultation without rales, wheezing or rhonchi  PSYCHIATRIC:  Normal affect        ASSESSMENT:    1. Atrial fibrillation, unspecified type ([place])   2. Encounter for therapeutic drug monitoring   3. Anemia, unspecified type    PLAN:    In order of problems listed above:   #Atrial fibrillation On Eliquis for stroke prophylaxis Continue diltiazem and metoprolol  #Anemia Needs updated blood work including a CBC, BMP and magnesium   Follow-up 1 year or sooner as needed      Medication Adjustments/Labs and Tests Ordered: Current medicines are reviewed at length with the patient today.  Concerns regarding medicines are outlined above.  No orders of the defined types were placed in this encounter.  No orders of the defined types were placed in this encounter.    Signed, Kathryn Mage, MD, [facility], [facility] 12/06/2022 9:00 PM    Electrophysiology [facility]

## 2022-12-07 ENCOUNTER — Encounter: Payer: Self-pay | Admitting: Cardiology

## 2022-12-07 ENCOUNTER — Ambulatory Visit: Payer: Medicare Other | Attending: Cardiology | Admitting: Cardiology

## 2022-12-07 VITALS — BP 140/72 | HR 74 | Ht 66.0 in | Wt 215.0 lb

## 2022-12-07 DIAGNOSIS — D649 Anemia, unspecified: Secondary | ICD-10-CM

## 2022-12-07 DIAGNOSIS — Z79899 Other long term (current) drug therapy: Secondary | ICD-10-CM | POA: Diagnosis not present

## 2022-12-07 DIAGNOSIS — Z5181 Encounter for therapeutic drug level monitoring: Secondary | ICD-10-CM

## 2022-12-07 DIAGNOSIS — I4891 Unspecified atrial fibrillation: Secondary | ICD-10-CM | POA: Diagnosis not present

## 2022-12-07 DIAGNOSIS — R0602 Shortness of breath: Secondary | ICD-10-CM

## 2022-12-07 NOTE — Progress Notes (Signed)
Electrophysiology Office Follow up Visit Note:    Date:  12/07/2022   ID:  Kathryn [name], DOB 18-Jun-1956, MRN [id number]  PCP:  Kathryn Bowen, MD  [facility] Cardiologist:  None  [facility] Electrophysiologist:  Vickie Epley, MD    Interval History:    Kathryn [name] is a 67 y.o. female who presents for a follow up visit.  She was previously seen by Dr. Rayann Heman.  She saw Butch Penny in [place] clinic on March 17, 2022.  She is on Xarelto for stroke prophylaxis.  At the appointment Butch Penny she reported a low burden of atrial fibrillation.  She saw Butch Penny again September 15, 2022 and was again doing well.  She reported an exertional dyspnea and reduced exercise tolerance that she thought was potentially related to an anemia.  Her last hemoglobin was 8.6 in July 2023.    Today, she reports having anemia as her blood count has been low. She has not followed up with any one regarding her anemia. She has Hx of breast cancer but no radiation therapy.  Over the course of 6 months, she had about 4 A fib episodes. Her husband reports that her episodes have been more frequent with low blood levels. She [place] major [place]. She had a second major episode last Saturday, lasting a few days. She reached near syncope. Her Cardizem 30 MG improved her symptoms. She last saw Dr. Rayann Heman on January 2022. She also reports that she has issues with her legs. She reports that a recent scan revealed an ulcer, but no bleeding was found  She discontinued Xarelto and was started on Eliquis. Her bleeding has improved since then. She complains of frequent fatigue and denies any blood in stool.   She denies any chest pain, shortness of breath, or peripheral edema. No headaches, orthopnea, or PND.   Past Medical History:  Diagnosis Date   Anxiety    Breast cancer ([place])    Cancer ([place]) 10/2016   Breast right,  lumpectomy,  did not require chemo/xrt   DDD (degenerative  disc disease), cervical    Depression    Dyspnea    Dysrhythmia    A Fib   Elevated cholesterol    GERD (gastroesophageal reflux disease)    H/O hiatal hernia    Hypertension    Hypothyroidism    Paroxysmal atrial fibrillation ([place]) 11/20/2020   Tremor    patient states "tremor has gotten better" on 06/30/19   Wears partial dentures    upper    Past Surgical History:  Procedure Laterality Date   ABDOMINAL HYSTERECTOMY  yrs ago   ANTERIOR LAT LUMBAR FUSION N/A 11/21/2019   Procedure: Lumbar two-three Lumbar three-four Anterolateral lumbar interbody fusion with lateral plate;  Surgeon: Kristeen Miss, MD;  Location: [place];  Service: Neurosurgery;  Laterality: N/A;   BREAST EXCISIONAL BIOPSY Right 11/2016   ALH and papillomas   BREAST LUMPECTOMY Right 2017   BREAST LUMPECTOMY WITH RADIOACTIVE SEED LOCALIZATION Right 12/15/2016   Procedure: RADIOACTIVE SEED X 2 GUIDED EXCISIONAL BREAST BIOPSY;  Surgeon: Rolm Bookbinder, MD;  Location: [place];  Service: General;  Laterality: Right;   CARPAL TUNNEL RELEASE Bilateral yrs ago   x 2   cervical neck fusion  yrs ago   C 3 and C 4    COLONOSCOPY     ESOPHAGOGASTRODUODENOSCOPY ENDOSCOPY N/A 01/09/2022   Procedure: INTRAOPERATIVE ENDOSCOPY;  Surgeon: Ralene Ok, MD;  Location: [place] [facility];  Service: General;  Laterality: N/A;   EYE SURGERY Bilateral    Cataracts removed   left knee arthroscopy   last done 2008   x 3   left shoulder arthroscopy  yrs ago   lower back surgery  2005   right total knee replacement   2008   TOTAL KNEE ARTHROPLASTY  01/14/2012   Procedure: TOTAL KNEE ARTHROPLASTY;  Surgeon: Johnn Hai, MD;  [facility];  Service: Orthopedics;  Laterality: Left;  preop femoral nerve block   UPPER GI ENDOSCOPY     WISDOM TOOTH EXTRACTION      Current Medications: Current Meds  Medication Sig   apixaban (ELIQUIS) 5 MG TABS tablet Take 1 tablet (5 mg total) by mouth 2 (two) times daily.    Cholecalciferol (VITAMIN D3) 25 MCG (1000 UT) CAPS 1 capsule Orally Once a day   colchicine 0.6 MG tablet Take 0.6 mg by mouth as needed.   diltiazem (CARDIZEM) 30 MG tablet Take 1 tablet every 4 hours AS NEEDED for heart rate >100 as long as blood pressure >100.   DULoxetine (CYMBALTA) 30 MG capsule Take 30 mg by mouth daily.   DULoxetine (CYMBALTA) 60 MG capsule Take 60 mg by mouth See admin instructions. Take with 30 mg to equal 90 mg at night   FERREX 150 150 MG capsule Take 150 mg by mouth 2 (two) times daily.   furosemide (LASIX) 20 MG tablet Take 1 tablet (20 mg total) by mouth daily as needed.   gabapentin (NEURONTIN) 600 MG tablet Take 600 mg by mouth 3 (three) times daily.   hydrochlorothiazide (HYDRODIURIL) 25 MG tablet Take 1 tablet (25 mg total) by mouth daily.   levothyroxine (SYNTHROID) 200 MCG tablet TAKE 1 TABLET BY MOUTH ONCE DAILY for 90   methocarbamol (ROBAXIN) 500 MG tablet Take 1 tablet (500 mg total) by mouth every 6 (six) hours as needed for muscle spasms.   metoprolol succinate (TOPROL-XL) 100 MG 24 hr tablet TAKE 1 TABLET BY MOUTH DAILY WITH OR IMMEDIATELY FOLLOWING A MEAL   omeprazole (PRILOSEC) 20 MG capsule Take 20 mg by mouth 2 (two) times daily.   oxyCODONE-acetaminophen (PERCOCET) 7.5-325 MG tablet Take 1 tablet by mouth every 8 (eight) hours as needed.     Allergies:   Patient has no known allergies.   Social History   Socioeconomic History   Marital status: Married    Spouse name: Not on file   Number of children: Not on file   Years of education: Not on file   Highest education level: Not on file  Occupational History   Not on file  Tobacco Use   Smoking status: Never   Smokeless tobacco: Never  Vaping Use   Vaping Use: Never used  Substance and Sexual Activity   Alcohol use: Yes    Comment: occasional wine   Drug use: No   Sexual activity: Not on file    Comment: Hysterectormy  Other Topics Concern   Not on file  Social History Narrative    Lives in [place] with spouse.   Retired Nurse, learning disability   Social Determinants of Radio broadcast assistant Strain: Not on [employer] Insecurity: Not on file  Transportation Needs: Not on file  Physical Activity: Not on file  Stress: Not on file  Social Connections: Not on file     Family History: The patient's family history is negative for Breast cancer.  ROS:  Please see the history of present illness.   (+) frequent fatigue  All other systems reviewed and are negative.  EKGs/Labs/Other Studies Reviewed:    The following studies were reviewed today:     Recent Labs: 06/08/2022: ALT 8; BUN 16; Creat 1.14; Hemoglobin 8.6; Platelets 306; Potassium 3.8; Sodium 138  Recent Lipid Panel No results found for: "CHOL", "TRIG", "HDL", "CHOLHDL", "VLDL", "LDLCALC", "LDLDIRECT"  Physical Exam:    VS:  BP (!) 140/72   Pulse 74   Ht '5\' 6"'$  (1.676 m)   Wt 215 lb (97.5 kg)   LMP  (LMP Unknown)   SpO2 96%   BMI 34.70 kg/m     Wt Readings from Last 3 Encounters:  12/07/22 215 lb (97.5 kg)  11/26/22 215 lb (97.5 kg)  09/15/22 216 lb 9.6 oz (98.2 kg)     GEN:  Well nourished, well developed in no acute distress CARDIAC: RRR, no murmurs, rubs, gallops RESPIRATORY:  Clear to auscultation without rales, wheezing or rhonchi  PSYCHIATRIC:  Normal affect        ASSESSMENT:    1. Atrial fibrillation, unspecified type ([place])   2. Encounter for therapeutic drug monitoring   3. Anemia, unspecified type   4. Medication management   5. Shortness of breath    PLAN:    In order of problems listed above:  #Fatigue and shortness of breath Unclear cause.  Given her history of significant anemia I am concerned that this may be a sign of worsening blood count.  I am getting get blood work today to recheck this in addition her kidney function and electrolytes.  I will also check an echocardiogram to make sure there there have been no changes in her ejection fraction or  valvular function.  #Atrial fibrillation On Eliquis for stroke prophylaxis Continue diltiazem and metoprolol Made a long discussion about treatment options for her atrial fibrillation.  Given the severity of her symptoms while in atrial fibrillation and the burden of her A-fib, I think a rhythm control strategy is clearly indicated.  We discussed antiarrhythmic drug therapy and catheter ablation.  I think as long as her hemoglobin stabilizes and there are no concerns with bleeding, catheter ablation could be considered as a treatment option.  I did discuss catheter ablation in detail with the patient during today's visit including the risks and recovery and likelihood of success.  We will discuss this further at our upcoming follow-up appointment in 8 weeks to finalize treatment plan.  We also discussed left atrial appendage occlusion as a mechanism to avoid long-term exposure anticoagulation given history of significant anemia.  I discussed the Watchman procedure in detail including the risks, recovery and likelihood of success.  We discussed the need for short-term anticoagulation around the time of the procedure.  We will discuss this further at our follow-up appointment to finalize treatment plan.   #Anemia Needs updated blood work including a CBC, BMP and magnesium  Follow-up : 8 weeks with me      Medication Adjustments/Labs and Tests Ordered: Current medicines are reviewed at length with the patient today.  Concerns regarding medicines are outlined above.  Orders Placed This Encounter  Procedures   CBC   Basic Metabolic Panel (BMET)   Magnesium   ECHOCARDIOGRAM COMPLETE   No orders of the defined types were placed in this encounter.   I,Mitra Faeizi,acting as a Education administrator for Vickie Epley, MD.,have documented all relevant documentation on the behalf of Vickie Epley, MD,as directed  by  Vickie Epley, MD while in the presence of Vickie Epley, MD.  I, Vickie Epley, MD, have reviewed all documentation for this visit. The documentation on 12/07/22 for the exam, diagnosis, procedures, and orders are all accurate and complete.      Signed, Lars Mage, MD, [facility], [facility] 12/07/2022 4:41 PM    Electrophysiology [facility]

## 2022-12-07 NOTE — Patient Instructions (Signed)
Medication Instructions:  Your physician recommends that you continue on your current medications as directed. Please refer to the Current Medication list given to you today.  *If you need a refill on your cardiac medications before your next appointment, please call your pharmacy*   Lab Work: Today: BMET, CBC & Magnesium  If you have labs (blood work) drawn today and your tests are completely normal, you will receive your results only by: [place] (if you have MyChart) OR A paper copy in the mail If you have any lab test that is abnormal or we need to change your treatment, we will call you to review the results.   Testing/Procedures: Your physician has requested that you have an echocardiogram. Echocardiography is a painless test that uses sound waves to create images of your heart. It provides your doctor with information about the size and shape of your heart and how well your heart's chambers and valves are working. This procedure takes approximately one hour. There are no restrictions for this procedure. Please do NOT wear cologne, perfume, aftershave, or lotions (deodorant is allowed). Please arrive 15 minutes prior to your appointment time.   Follow-Up: At [facility], you and your health needs are our priority.  As part of our continuing mission to provide you with exceptional heart care, we have created designated Provider Care Teams.  These Care Teams include your primary Cardiologist (physician) and Advanced Practice Providers (APPs -  Physician Assistants and Nurse Practitioners) who all work together to provide you with the care you need, when you need it.  Your next appointment:   8 week(s)  The format for your next appointment:   In Person  Provider:   Lars Mage, MD    Thank you for choosing [facility]!!   [phone]  Other Instructions

## 2022-12-08 LAB — MAGNESIUM: Magnesium: 2 mg/dL (ref 1.6–2.3)

## 2022-12-08 LAB — CBC
Hematocrit: 34.5 % (ref 34.0–46.6)
Hemoglobin: 11.4 g/dL (ref 11.1–15.9)
MCH: 28 pg (ref 26.6–33.0)
MCHC: 33 g/dL (ref 31.5–35.7)
MCV: 85 fL (ref 79–97)
Platelets: 286 10*3/uL (ref 150–450)
RBC: 4.07 x10E6/uL (ref 3.77–5.28)
RDW: 15.6 % — ABNORMAL HIGH (ref 11.7–15.4)
WBC: 7.4 10*3/uL (ref 3.4–10.8)

## 2022-12-08 LAB — BASIC METABOLIC PANEL
BUN/Creatinine Ratio: 15 (ref 12–28)
BUN: 18 mg/dL (ref 8–27)
CO2: 28 mmol/L (ref 20–29)
Calcium: 9.9 mg/dL (ref 8.7–10.3)
Chloride: 95 mmol/L — ABNORMAL LOW (ref 96–106)
Creatinine, Ser: 1.17 mg/dL — ABNORMAL HIGH (ref 0.57–1.00)
Glucose: 99 mg/dL (ref 70–99)
Potassium: 3.9 mmol/L (ref 3.5–5.2)
Sodium: 139 mmol/L (ref 134–144)
eGFR: 51 mL/min/{1.73_m2} — ABNORMAL LOW (ref >59)

## 2022-12-30 DIAGNOSIS — M1612 Unilateral primary osteoarthritis, left hip: Secondary | ICD-10-CM | POA: Diagnosis not present

## 2022-12-30 DIAGNOSIS — M48062 Spinal stenosis, lumbar region with neurogenic claudication: Secondary | ICD-10-CM | POA: Diagnosis not present

## 2022-12-30 DIAGNOSIS — G894 Chronic pain syndrome: Secondary | ICD-10-CM | POA: Diagnosis not present

## 2022-12-30 DIAGNOSIS — M5416 Radiculopathy, lumbar region: Secondary | ICD-10-CM | POA: Diagnosis not present

## 2022-12-31 ENCOUNTER — Encounter (HOSPITAL_COMMUNITY): Payer: Self-pay | Admitting: *Deleted

## 2023-01-11 DIAGNOSIS — Z6833 Body mass index (BMI) 33.0-33.9, adult: Secondary | ICD-10-CM | POA: Diagnosis not present

## 2023-01-11 DIAGNOSIS — M48062 Spinal stenosis, lumbar region with neurogenic claudication: Secondary | ICD-10-CM | POA: Diagnosis not present

## 2023-01-11 DIAGNOSIS — F1120 Opioid dependence, uncomplicated: Secondary | ICD-10-CM | POA: Diagnosis not present

## 2023-01-11 DIAGNOSIS — M1612 Unilateral primary osteoarthritis, left hip: Secondary | ICD-10-CM | POA: Diagnosis not present

## 2023-01-12 ENCOUNTER — Ambulatory Visit (HOSPITAL_COMMUNITY): Payer: Medicare Other | Attending: Cardiology

## 2023-01-12 DIAGNOSIS — R0602 Shortness of breath: Secondary | ICD-10-CM | POA: Diagnosis not present

## 2023-01-12 LAB — ECHOCARDIOGRAM COMPLETE
AR max vel: 1.41 cm2
AV Area VTI: 1.38 cm2
AV Area mean vel: 1.27 cm2
AV Mean grad: 15 mmHg
AV Peak grad: 23.4 mmHg
Ao pk vel: 2.42 m/s
Area-P 1/2: 2.5 cm2
S' Lateral: 3.6 cm

## 2023-01-19 ENCOUNTER — Telehealth: Payer: Self-pay | Admitting: *Deleted

## 2023-01-19 DIAGNOSIS — M1612 Unilateral primary osteoarthritis, left hip: Secondary | ICD-10-CM | POA: Diagnosis not present

## 2023-01-19 NOTE — Telephone Encounter (Signed)
   Pre-operative Risk Assessment    Patient Name: Kathryn [name]  DOB: 17-Jan-1956 MRN: [id number]      Request for Surgical Clearance    Procedure:   LEFT TOTAL HIP ARTHROPLASTY  Date of Surgery:  Clearance TBD                                 Surgeon:  DR. Rod Can Surgeon's Group or Practice Name:  [facility] Phone number:  [phone] ATTN: [place] Fax number:  [phone]   Type of Clearance Requested:   - Medical  - Pharmacy:  Hold Apixaban (Eliquis)     Type of Anesthesia:  Spinal   Additional requests/questions:    Jiles Prows   01/19/2023, 3:09 PM

## 2023-01-19 NOTE — Telephone Encounter (Signed)
S/w the pt and she is agreeable to plan of care per pre op APP pt to est with [facility]. Pt has been scheduled to see Dr. Irish Lack 01/28/23 @ 9 am, new pt appt pre op clearance. I will update all parties involved. I will send a message to chart prep to prepare the chart for Dr. Irish Lack. Pt thanked me for the help.

## 2023-01-19 NOTE — Telephone Encounter (Signed)
   Name: INDIAH MURCHISON  DOB: 09-Mar-1956  MRN: [id number]  Primary Cardiologist: None (followed only by EP at this time, Dr. Quentin Ore)  Chart reviewed as part of pre-operative protocol coverage. Because of Mellody Florentine [id number] past medical history and time since last visit, she will require a follow-up in-office visit in order to better assess preoperative cardiovascular risk. At last OV 11/2022, patient was complaining of exertional dyspnea. F/u Hgb was improved from prior. Echocardiogram showed normal EF, mild AS. Patient has no prior ischemic testing aside from remote nuc in 2009 that was normal. Per pre-op protocol, given symptoms at recent OV, unable to clear by protocol, needs in-person visit. She also had prior phone note 09/2022 for chest pain.  Recommend general cardiology evaluation for symptoms and pre-op evaluation.   Pre-op covering staff: - Please schedule appointment and call patient to inform them. - Please contact requesting surgeon's office via preferred method (i.e, phone, fax) to inform them of need for appointment prior to surgery.  This message will also be routed to pharmacy pool for input on holding Eliquis as requested below so that this information is available to the clearing provider at time of patient's appointment.   Charlie Pitter, PA-C  01/19/2023, 3:51 PM

## 2023-01-20 NOTE — Telephone Encounter (Signed)
Patient with diagnosis of afib on Eliquis for anticoagulation.    Procedure:  LEFT TOTAL HIP ARTHROPLASTY  Date of procedure: TBD   CHA2DS2-VASc Score = 4   This indicates a 4.8% annual risk of stroke. The patient's score is based upon: CHF History: 0 HTN History: 1 Diabetes History: 0 Stroke History: 0 Vascular Disease History: 1 Age Score: 1 Gender Score: 1      CrCl 54 ml/min  Per office protocol, patient can hold Eliquis for 3 days prior to procedure.    **This guidance is not considered finalized until pre-operative APP has relayed final recommendations.**

## 2023-01-28 ENCOUNTER — Ambulatory Visit: Payer: Medicare Other | Attending: Interventional Cardiology | Admitting: Interventional Cardiology

## 2023-01-28 VITALS — BP 128/82 | HR 66 | Ht 66.0 in | Wt 211.4 lb

## 2023-01-28 DIAGNOSIS — I700 Atherosclerosis of aorta: Secondary | ICD-10-CM | POA: Diagnosis not present

## 2023-01-28 DIAGNOSIS — I480 Paroxysmal atrial fibrillation: Secondary | ICD-10-CM

## 2023-01-28 DIAGNOSIS — R072 Precordial pain: Secondary | ICD-10-CM | POA: Diagnosis not present

## 2023-01-28 DIAGNOSIS — R0602 Shortness of breath: Secondary | ICD-10-CM | POA: Diagnosis not present

## 2023-01-28 DIAGNOSIS — Z01818 Encounter for other preprocedural examination: Secondary | ICD-10-CM | POA: Diagnosis not present

## 2023-01-28 NOTE — Progress Notes (Addendum)
Cardiology Office Note   Date:  01/28/2023   ID:  Kathryn [name], Kathryn [name] 10-Feb-1956, MRN [id number]  PCP:  [name], MD    No chief complaint on file.  Preoperative eval  Wt Readings from Last 3 Encounters:  12/07/22 215 lb (97.5 kg)  11/26/22 215 lb (97.5 kg)  09/15/22 216 lb 9.6 oz (98.2 kg)       History of Present Illness: Kathryn [name] is a 67 y.o. female who is being seen today for the evaluation of preoperative eval at the request of [name], MD.   Echo in February 2024: "Left ventricular ejection fraction, by estimation, is 60 to 65%. The  left ventricle has normal function. The left ventricle has no regional  wall motion abnormalities. Left ventricular diastolic parameters are  consistent with Grade I diastolic  dysfunction (impaired relaxation).   2. Right ventricular systolic function is normal. The right ventricular  size is normal. There is normal pulmonary artery systolic pressure.   3. The mitral valve is normal in structure. Trivial mitral valve  regurgitation. No evidence of mitral stenosis.   4. The aortic valve is tricuspid. There is moderate calcification of the  aortic valve. Aortic valve regurgitation is not visualized. Mild aortic  valve stenosis. Aortic valve area, by VTI measures 1.38 cm. Aortic valve  mean gradient measures 15.0 mmHg.  Aortic valve Vmax measures 2.42 m/s.   5. The inferior vena cava is normal in size with greater than 50%  respiratory variability, suggesting right atrial pressure of 3 mmHg. "  No bleeding issues currently.  She did have some bleeding issues on Xarelto..  Uses diltiazem for AFib as needed.   Reports dyspnea on exertion with some occasional associated chest pressure.  Denies :  Dizziness. Leg edema. Nitroglycerin use. Orthopnea. Palpitations. Paroxysmal nocturnal dyspnea. Syncope.    Activity significantly limited by hip pain.  Does not walk up stairs.   Past Medical History:  Diagnosis Date    Anxiety    Breast cancer ([place])    Cancer ([place]) 10/2016   Breast right,  lumpectomy,  did not require chemo/xrt   DDD (degenerative disc disease), cervical    Depression    Dyspnea    Dysrhythmia    A Fib   Elevated cholesterol    GERD (gastroesophageal reflux disease)    H/O hiatal hernia    Hypertension    Hypothyroidism    Paroxysmal atrial fibrillation ([place]) 11/20/2020   Tremor    patient states "tremor has gotten better" on 06/30/19   Wears partial dentures    upper    Past Surgical History:  Procedure Laterality Date   ABDOMINAL HYSTERECTOMY  yrs ago   ANTERIOR LAT LUMBAR FUSION N/A 11/21/2019   Procedure: Lumbar two-three Lumbar three-four Anterolateral lumbar interbody fusion with lateral plate;  Surgeon: Kristeen Miss, MD;  Location: [facility];  Service: Neurosurgery;  Laterality: N/A;   BREAST EXCISIONAL BIOPSY Right 11/2016   ALH and papillomas   BREAST LUMPECTOMY Right 2017   BREAST LUMPECTOMY WITH RADIOACTIVE SEED LOCALIZATION Right 12/15/2016   Procedure: RADIOACTIVE SEED X 2 GUIDED EXCISIONAL BREAST BIOPSY;  Surgeon: Rolm Bookbinder, MD;  Location: [facility];  Service: General;  Laterality: Right;   CARPAL TUNNEL RELEASE Bilateral yrs ago   x 2   cervical neck fusion  yrs ago   C 3 and C 4    COLONOSCOPY     ESOPHAGOGASTRODUODENOSCOPY ENDOSCOPY N/A 01/09/2022   Procedure: INTRAOPERATIVE  ENDOSCOPY;  Surgeon: Ralene Ok, MD;  Location: [facility] OR;  Service: General;  Laterality: N/A;   EYE SURGERY Bilateral    Cataracts removed   left knee arthroscopy   last done 2008   x 3   left shoulder arthroscopy  yrs ago   lower back surgery  2005   right total knee replacement   2008   TOTAL KNEE ARTHROPLASTY  01/14/2012   Procedure: TOTAL KNEE ARTHROPLASTY;  Surgeon: Johnn Hai, MD;  Location: [facility];  Service: Orthopedics;  Laterality: Left;  preop femoral nerve block   UPPER GI ENDOSCOPY     WISDOM TOOTH EXTRACTION       Current Outpatient  Medications  Medication Sig Dispense Refill   apixaban (ELIQUIS) 5 MG TABS tablet Take 1 tablet (5 mg total) by mouth 2 (two) times daily. 60 tablet 11   Cholecalciferol (VITAMIN D3) 25 MCG (1000 UT) CAPS 1 capsule Orally Once a day     colchicine 0.6 MG tablet Take 0.6 mg by mouth as needed.     diltiazem (CARDIZEM) 30 MG tablet Take 1 tablet every 4 hours AS NEEDED for heart rate >100 as long as blood pressure >100. 30 tablet 1   DULoxetine (CYMBALTA) 30 MG capsule Take 30 mg by mouth daily.     DULoxetine (CYMBALTA) 60 MG capsule Take 60 mg by mouth See admin instructions. Take with 30 mg to equal 90 mg at night     FERREX 150 150 MG capsule Take 150 mg by mouth 2 (two) times daily.     furosemide (LASIX) 20 MG tablet Take 1 tablet (20 mg total) by mouth daily as needed. 30 tablet 1   gabapentin (NEURONTIN) 600 MG tablet Take 600 mg by mouth 3 (three) times daily.     hydrochlorothiazide (HYDRODIURIL) 25 MG tablet Take 1 tablet (25 mg total) by mouth daily. 30 tablet 11   levothyroxine (SYNTHROID) 200 MCG tablet TAKE 1 TABLET BY MOUTH ONCE DAILY for 90     methocarbamol (ROBAXIN) 500 MG tablet Take 1 tablet (500 mg total) by mouth every 6 (six) hours as needed for muscle spasms. 40 tablet 3   metoprolol succinate (TOPROL-XL) 100 MG 24 hr tablet TAKE 1 TABLET BY MOUTH DAILY WITH OR IMMEDIATELY FOLLOWING A MEAL 90 tablet 2   omeprazole (PRILOSEC) 20 MG capsule Take 20 mg by mouth 2 (two) times daily.     oxyCODONE-acetaminophen (PERCOCET) 7.5-325 MG tablet Take 1 tablet by mouth every 8 (eight) hours as needed.     No current facility-administered medications for this visit.    Allergies:   Patient has no known allergies.    Social History:  The patient  reports that she has never smoked. She has never used smokeless tobacco. She reports current alcohol use. She reports that she does not use drugs.   Family History:  The patient's family history is not on file.    ROS:  Please see the  history of present illness.   Otherwise, review of systems are positive for hip pain- balance issues..   All other systems are reviewed and negative.    PHYSICAL EXAM: VS:  Ht '5\' 6"'$  (1.676 m)   LMP  (LMP Unknown)   BMI 34.70 kg/m  , BMI Body mass index is 34.7 kg/m. GEN: Well nourished, well developed, in no acute distress HEENT: normal Neck: no JVD, carotid bruits, or masses Cardiac: RRR, premature beats; no murmurs, rubs, or gallops,no edema  Respiratory:  clear to auscultation bilaterally, normal work of breathing GI: soft, nontender, nondistended, + BS MS: no deformity or atrophy Skin: warm and dry, no rash Neuro:  Strength and sensation are intact Psych: euthymic mood, full affect   EKG:   The ekg ordered today demonstrates normal sinus rhythm, T wave inversion in leads V1 and V2 are slightly more prominent   Recent Labs: 06/08/2022: ALT 8 12/07/2022: BUN 18; Creatinine, Ser 1.17; Hemoglobin 11.4; Magnesium 2.0; Platelets 286; Potassium 3.9; Sodium 139   Lipid Panel No results found for: "CHOL", "TRIG", "HDL", "CHOLHDL", "VLDL", "LDLCALC", "LDLDIRECT"   Other studies Reviewed: Additional studies/ records that were reviewed today with results demonstrating: Stress test done in 2009.  Echocardiogram from February 2024 reviewed.  Normal creatinine in January 2024..   ASSESSMENT AND PLAN:  Preoperative eval: Had fairly recent back surgery without any cardiac issues.  Does ok walking on flat surfaces but due to her hip, does not complete 4 METS of activity.  Feels that her shortness of breath is perhaps a little worse and can be associated with some occasional chest pressure.  Will check CTA coronaries to evaluate for obstructive CAD.  Mild change noted in her anterior leads with more prominent T wave inversion.  Can take her as needed diltiazem prior to the CT scan to ensure her heart rate will be controlled.  She already takes metoprolol ER 100 mg daily.  Looking to rule out  high risk anatomy with the CTA.  Even if she had small vessel or branch vessel disease, she could likely proceed with surgery without invasive cardiac testing. DOE: Some deconditioning.  Echo reassuring with normal LV function and mild aortic stenosis. Aortic atherosclerosis: Hyperlipidemia noted as well with LDL 165.  She would benefit from lipid-lowering therapy.  Will also see what calcium score is to decide on intensity of therapy.  Most likely will start this after her hip replacement if this is going to happen in the near future. PAF: She has been on Eliquis for stroke prevention.  She reported some bleeding on Xarelto.  She has only been taking the Eliquis once a day.  We explained to her that Eliquis is typically taken twice a day.  There are also cost issues with Eliquis.  Will try to see if she is eligible for less expensive supply of Eliquis.  Will have her discuss further options with Dr. Quentin Ore.  I am unsure exactly what her bleeding issues were, but if she does not tolerate full dose anticoagulation and it is still indicated, they may need to consider watchman.   Current medicines are reviewed at length with the patient today.  The patient concerns regarding her medicines were addressed.  The following changes have been made:  No change  Labs/ tests ordered today include:  No orders of the defined types were placed in this encounter.   Recommend 150 minutes/week of aerobic exercise Low fat, low carb, high fiber diet recommended  Disposition:   FU in prn based on CTA   Signed, Larae Grooms, MD  01/28/2023 9:07 AM    [facility] [place], [place],   [address] Phone: [phone]; Fax: [phone]   Addendum: Coronary CT wihtout obstructive disease.  No further testing needed prior to surgery.  Eliquis per PharmD recs.  Jettie Booze, MD

## 2023-01-28 NOTE — Patient Instructions (Addendum)
Medication Instructions:  Your physician recommends that you continue on your current medications as directed. Please refer to the Current Medication list given to you today.  *If you need a refill on your cardiac medications before your next appointment, please call your pharmacy*   Lab Work: Lab work to be done today--BMP If you have labs (blood work) drawn today and your tests are completely normal, you will receive your results only by: [place] (if you have MyChart) OR A paper copy in the mail If you have any lab test that is abnormal or we need to change your treatment, we will call you to review the results.   Testing/Procedures: Your physician has requested that you have cardiac CT. Cardiac computed tomography (CT) is a painless test that uses an x-ray machine to take clear, detailed pictures of your heart. For further information please visit [email or web address]. Please follow instruction sheet as given.     Follow-Up: At [facility], you and your health needs are our priority.  As part of our continuing mission to provide you with exceptional heart care, we have created designated Provider Care Teams.  These Care Teams include your primary Cardiologist (physician) and Advanced Practice Providers (APPs -  Physician Assistants and Nurse Practitioners) who all work together to provide you with the care you need, when you need it.  We recommend signing up for the patient portal called "MyChart".  Sign up information is provided on this After Visit Summary.  MyChart is used to connect with patients for Virtual Visits (Telemedicine).  Patients are able to view lab/test results, encounter notes, upcoming appointments, etc.  Non-urgent messages can be sent to your provider as well.   To learn more about what you can do with MyChart, go to [email or web address].    Your next appointment:   Based on results  Provider:   Larae Grooms, MD     Other  Instructions   Your cardiac CT will be scheduled at one of the below locations:   [facility] [address] [place], [place] [address] [phone]  [facility] [address] [place], [place] [address] [phone]  [facility] [place], [place] [address] [phone]  If scheduled at [facility], please arrive at the [facility]'s Entrance (Entrance C2) of [facility] 30 minutes prior to test start time. You can use the FREE valet parking offered at entrance C (encouraged to control the heart rate for the test)  Proceed to the [facility] Radiology Department (first floor) to check-in and test prep.  All radiology patients and guests should use entrance C2 at [facility], accessed from [facility], even though the hospital's physical address listed is [address]    If scheduled at [facility] or [facility], please arrive 15 mins early for check-in and test prep.   Please follow these instructions carefully (unless otherwise directed):     On the Night Before the Test: Be sure to Drink plenty of water. Do not consume any caffeinated/decaffeinated beverages or chocolate 12 hours prior to your test. Do not take any antihistamines 12 hours prior to your test.  On the Day of the Test: Drink plenty of water until 1 hour prior to the test. Do not eat any food 1 hour prior to test. You may take your regular medications prior to  the test.  Take Cardizem 30 mg 2 hours prior to test.  Take normal dose of metoprolol as usual the day of test If you take Furosemide/Hydrochlorothiazide/Spironolactone, please HOLD on the morning of the test. FEMALES- please wear underwire-free bra if available, avoid dresses & tight clothing         After the Test: Drink plenty of  water. After receiving IV contrast, you may experience a mild flushed feeling. This is normal. On occasion, you may experience a mild rash up to 24 hours after the test. This is not dangerous. If this occurs, you can take Benadryl 25 mg and increase your fluid intake. If you experience trouble breathing, this can be serious. If it is severe call 911 IMMEDIATELY. If it is mild, please call our office. If you take any of these medications: Glipizide/Metformin, Avandament, Glucavance, please do not take 48 hours after completing test unless otherwise instructed.  We will call to schedule your test 2-4 weeks out understanding that some insurance companies will need an authorization prior to the service being performed.   For non-scheduling related questions, please contact the cardiac imaging nurse navigator should you have any questions/concerns: Marchia Bond, Cardiac Imaging Nurse Navigator Gordy Clement, Cardiac Imaging Nurse Navigator [facility] Direct Office Dial: [phone]   For scheduling needs, including cancellations and rescheduling, please call [place], [phone].

## 2023-01-29 ENCOUNTER — Telehealth (HOSPITAL_COMMUNITY): Payer: Self-pay | Admitting: *Deleted

## 2023-01-29 LAB — BASIC METABOLIC PANEL
BUN/Creatinine Ratio: 17 (ref 12–28)
BUN: 15 mg/dL (ref 8–27)
CO2: 29 mmol/L (ref 20–29)
Calcium: 9.9 mg/dL (ref 8.7–10.3)
Chloride: 93 mmol/L — ABNORMAL LOW (ref 96–106)
Creatinine, Ser: 0.86 mg/dL (ref 0.57–1.00)
Glucose: 101 mg/dL — ABNORMAL HIGH (ref 70–99)
Potassium: 4.3 mmol/L (ref 3.5–5.2)
Sodium: 138 mmol/L (ref 134–144)
eGFR: 74 mL/min/{1.73_m2} (ref >59)

## 2023-01-29 NOTE — Telephone Encounter (Signed)
Patient returning call about her upcoming cardiac imaging study; pt verbalizes understanding of appt date/time, parking situation and where to check in, pre-test NPO status and medications ordered, and verified current allergies; name and call back number provided for further questions should they arise  Gordy Clement RN Navigator Cardiac Imaging [facility] [phone] office [phone] cell  Patient to take '100mg'$  metoprolol succinate and '30mg'$  Cardizem two hours prior to her cardiac CT scan. She is aware to arrive at 7:30am.

## 2023-01-29 NOTE — Telephone Encounter (Signed)
Attempted to call patient regarding upcoming cardiac CT appointment. °Left message on voicemail with name and callback number ° °Indya Oliveria RN Navigator Cardiac Imaging °[facility] °[phone] Office °[phone] Cell ° °

## 2023-01-29 NOTE — Telephone Encounter (Signed)
Requesting office sent a duplicate request. Pt was seen by Dr. Irish Lack on 01/28/23. Dr. Irish Lack ordered a cardiac CT which is scheduled for 02/01/23. Once the pt has been cleared our office will be sure to fax notes.

## 2023-02-01 ENCOUNTER — Ambulatory Visit (HOSPITAL_COMMUNITY)
Admission: RE | Admit: 2023-02-01 | Discharge: 2023-02-01 | Disposition: A | Discharge status: Home / Self Care | Payer: Medicare Other | Source: Ambulatory Visit | Attending: Interventional Cardiology | Admitting: Interventional Cardiology

## 2023-02-01 DIAGNOSIS — R072 Precordial pain: Secondary | ICD-10-CM | POA: Insufficient documentation

## 2023-02-01 MED ORDER — NITROGLYCERIN 0.4 MG SL SUBL
SUBLINGUAL_TABLET | SUBLINGUAL | Status: AC
  Filled 2023-02-01: qty 2

## 2023-02-01 MED ORDER — METOPROLOL TARTRATE 5 MG/5ML IV SOLN
5.0000 mg | INTRAVENOUS | Status: DC | PRN
  Administered 2023-02-01: 5 mg via INTRAVENOUS

## 2023-02-01 MED ORDER — METOPROLOL TARTRATE 5 MG/5ML IV SOLN
INTRAVENOUS | Status: AC
  Filled 2023-02-01: qty 5

## 2023-02-01 MED ORDER — NITROGLYCERIN 0.4 MG SL SUBL
0.8000 mg | SUBLINGUAL_TABLET | Freq: Once | SUBLINGUAL | Status: AC
  Administered 2023-02-01: 0.8 mg via SUBLINGUAL

## 2023-02-01 MED ORDER — IOHEXOL 350 MG/ML SOLN
100.0000 mL | Freq: Once | INTRAVENOUS | Status: AC | PRN
  Administered 2023-02-01: 100 mL via INTRAVENOUS

## 2023-02-02 NOTE — Telephone Encounter (Signed)
Kathryn [name] with Dr. Lyla Glassing office called checking on clearance. I s/w Dr. Irish Lack and confirmed that he did amend ov note: I addended my note already. 3 days hold of Eliquis is fine.    Dr. Irish Lack has noted on CT as well no further cardiac work up needed and cleared for surgery. I assured Kathryn [name], I will re-send notes to update the pt has been cleared and ok to hold Eliquis x 3 days prior to surgery. Kathryn [name] thanked me for the help today.

## 2023-02-08 NOTE — Progress Notes (Unsigned)
Electrophysiology Office Follow up Visit Note:    Date:  02/10/2023   ID:  Kathryn [name], DOB 03/03/1956, MRN [id number]  PCP:  [name], MD  [place] Cardiologist:  Larae Grooms, MD  [facility] Electrophysiologist:  Vickie Epley, MD    Interval History:    Kathryn [name] is a 67 y.o. female who presents for a follow up visit.   Last seen 12/07/2022. I saw her AF and we discussed catheter ablation/watchman given history of symptomatic anemia. At the last appointment I ordered labs and checked an echo and planned f/u to finalize treatment plan.  Since our last appointment, she has been doing well.  Her hip is giving her some trouble with chronic arthritic pain.  She has an upcoming hip replacement surgery scheduled for May.  She has been taking her Eliquis.      Past medical, surgical, social and family history were reviewed.  ROS:   Please see the history of present illness.    All other systems reviewed and are negative.  EKGs/Labs/Other Studies Reviewed:    The following studies were reviewed today:  01/12/2023 Echo - EF 60, RV normal, trivial MR, mild AS  02/01/2023 CTA coronary - calcium score 46th percentile. Non-severe AS.    Physical Exam:    VS:  BP 124/82   Pulse (!) 52   Ht 5\' 6"  (1.676 m)   Wt 212 lb 12.8 oz (96.5 kg)   LMP  (LMP Unknown)   SpO2 95%   BMI 34.35 kg/m     Wt Readings from Last 3 Encounters:  02/10/23 212 lb 12.8 oz (96.5 kg)  01/28/23 211 lb 6.4 oz (95.9 kg)  12/07/22 215 lb (97.5 kg)     GEN:  Well nourished, well developed in no acute distress CARDIAC: RRR, 2 out of 6 crescendo decrescendo systolic murmur.  No rubs, gallops RESPIRATORY:  Clear to auscultation without rales, wheezing or rhonchi       ASSESSMENT:    1. Pre-operative clearance   2. PAF (paroxysmal atrial fibrillation) ([place])   3. Anemia, unspecified type    PLAN:    In order of problems listed above:   #AF On Eliquis but has  history of anemia  Discussed treatment options today for their AF including antiarrhythmic drug therapy and ablation.  I think long-term the best strategy will be an A-fib ablation followed by Watchman but she is planning to get her hip replaced in May.  I would like her to get this done first and heal before proceeding with PVI because once we start with the PVI followed by watchman sequence, anticoagulation cannot safely be stopped for 4 months.    We will plan to touch base in about 6 months and finalize treatment plans.    HAS-BLED score 3 Hypertension Yes  Abnormal renal and liver function (Dialysis, transplant, Cr >2.26 mg/dL /Cirrhosis or Bilirubin >2x Normal or AST/ALT/AP >3x Normal) No  Stroke No  Bleeding Yes  Labile INR (Unstable/high INR) No  Elderly (>65) Yes  Drugs or alcohol (? 8 drinks/week, anti-plt or NSAID) No   CHA2DS2-VASc Score = 4  The patient's score is based upon: CHF History: 0 HTN History: 1 Diabetes History: 0 Stroke History: 0 Vascular Disease History: 1 Age Score: 1 Gender Score: 1  #Preop I think she is at acceptable risk to undergo orthopedic surgery.  No ischemic or heart failure symptoms today.  Echo shows normal ejection fraction recently.  It is  okay for her to hold her anticoagulant for 2 to 3 days prior to the planned surgery and restart when felt safe from a surgical perspective.  She should continue her metoprolol in the perioperative period.   Follow-up 6 months with me   Signed, Lars Mage, MD, [facility], [facility] 02/10/2023 2:07 PM    Electrophysiology [facility]

## 2023-02-10 ENCOUNTER — Ambulatory Visit: Payer: Medicare Other | Attending: Cardiology | Admitting: Cardiology

## 2023-02-10 ENCOUNTER — Encounter: Payer: Self-pay | Admitting: Cardiology

## 2023-02-10 VITALS — BP 124/82 | HR 52 | Ht 66.0 in | Wt 212.8 lb

## 2023-02-10 DIAGNOSIS — Z01818 Encounter for other preprocedural examination: Secondary | ICD-10-CM | POA: Diagnosis not present

## 2023-02-10 DIAGNOSIS — R931 Abnormal findings on diagnostic imaging of heart and coronary circulation: Secondary | ICD-10-CM | POA: Diagnosis not present

## 2023-02-10 DIAGNOSIS — E039 Hypothyroidism, unspecified: Secondary | ICD-10-CM | POA: Diagnosis not present

## 2023-02-10 DIAGNOSIS — I700 Atherosclerosis of aorta: Secondary | ICD-10-CM | POA: Diagnosis not present

## 2023-02-10 DIAGNOSIS — E785 Hyperlipidemia, unspecified: Secondary | ICD-10-CM | POA: Diagnosis not present

## 2023-02-10 DIAGNOSIS — I10 Essential (primary) hypertension: Secondary | ICD-10-CM | POA: Diagnosis not present

## 2023-02-10 DIAGNOSIS — I480 Paroxysmal atrial fibrillation: Secondary | ICD-10-CM | POA: Diagnosis not present

## 2023-02-10 DIAGNOSIS — D649 Anemia, unspecified: Secondary | ICD-10-CM | POA: Diagnosis not present

## 2023-02-10 NOTE — Patient Instructions (Signed)
Medication Instructions:  Your physician recommends that you continue on your current medications as directed. Please refer to the Current Medication list given to you today. *If you need a refill on your cardiac medications before your next appointment, please call your pharmacy*   Follow-Up: At [facility], you and your health needs are our priority.  As part of our continuing mission to provide you with exceptional heart care, we have created designated Provider Care Teams.  These Care Teams include your primary Cardiologist (physician) and Advanced Practice Providers (APPs -  Physician Assistants and Nurse Practitioners) who all work together to provide you with the care you need, when you need it.  We recommend signing up for the patient portal called "MyChart".  Sign up information is provided on this After Visit Summary.  MyChart is used to connect with patients for Virtual Visits (Telemedicine).  Patients are able to view lab/test results, encounter notes, upcoming appointments, etc.  Non-urgent messages can be sent to your provider as well.   To learn more about what you can do with MyChart, go to [email or web address].    Your next appointment:   6 month(s)  Provider:   Lars Mage, MD

## 2023-02-17 ENCOUNTER — Other Ambulatory Visit (HOSPITAL_COMMUNITY): Payer: Self-pay | Admitting: Nurse Practitioner

## 2023-03-02 ENCOUNTER — Other Ambulatory Visit: Payer: Self-pay | Admitting: *Deleted

## 2023-03-02 MED ORDER — METOPROLOL SUCCINATE ER 100 MG PO TB24: ORAL_TABLET | ORAL | 3 refills | Status: DC

## 2023-03-22 NOTE — Progress Notes (Signed)
Sent message, via epic in basket, requesting orders in epic from surgeon.  

## 2023-03-24 ENCOUNTER — Ambulatory Visit: Payer: Self-pay | Admitting: Student

## 2023-03-28 NOTE — Progress Notes (Signed)
COVID Vaccine received:  [x]  No []  Yes Date of any COVID positive Test in last 90 days:  None  PCP - Adrian Prince, MD  Clearance on chart Cardiologist - Everette Rank, MD  Cardiac clearance on chart EP - Steffanie Dunn, MD EP Clearance in 02-10-2023 note  Chest x-ray - 11-20-2020  Epic EKG -  01-28-2023  Epic Stress Test -  ECHO - 01-12-2023  Epic Cardiac Cath -  CT Coronary score- 02-01-2023   Score: 1.22  PCR screen: [x]  Ordered & Completed           []   No Order but Needs PROFEND           []   N/A for this surgery  Surgery Plan:  []  Ambulatory                            [x]  Outpatient in bed                            []  Admit  Anesthesia:    []  General  [x]  Spinal                           []   Choice []   MAC  Pacemaker / ICD device [x]  No []  Yes   Spinal Cord Stimulator:[x]  No []  Yes       History of Sleep Apnea? [x]  No []  Yes   CPAP used?- [x]  No []  Yes    Does the patient monitor blood sugar?          []  No []  Yes  [x]  N/A Patient has: [x]  NO Hx DM   []  Pre-DM                 []  DM1  []   DM2  Blood Thinner / Instructions: Eliquis Ok to Hold x 3 days per Dr. Eldridge Dace and Dr. Lalla Brothers notes. Patient is aware.  Aspirin Instructions: none  ERAS Protocol Ordered: []  No  [x]  Yes PRE-SURGERY [x]  ENSURE  []  G2  Patient is to be NPO after: 09:15  am  Patient was given the 5 CHG shower / bath instructions for THA surgery along with 2 bottles of the CHG soap. Patient will start this on:  Sunday Apr 04, 2023.          Patient voiced understanding of this process.   Activity level: Patient is unable to climb a flight of stairs without difficulty;   Patient can  perform ADLs without assistance.   Anesthesia review: HTN, A.fib, Mild AS, nonobstructive CAD, GERD, ACDF (C3-4) anxiety, s/p right breast lumpectomy only. No Chemo or radiation.   Patient denies shortness of breath, fever, cough and chest pain at PAT appointment.  Patient verbalized understanding and agreement to the  Pre-Surgical Instructions that were given to them at this PAT appointment. Patient was also educated of the need to review these PAT instructions again prior to her surgery.I reviewed the appropriate phone numbers to call if they have any and questions or concerns.

## 2023-03-28 NOTE — Patient Instructions (Signed)
SURGICAL WAITING ROOM VISITATION Patients having surgery or a procedure may have no more than 2 support people in the waiting area - these visitors may rotate in the visitor waiting room.   Due to an increase in RSV and influenza rates and associated hospitalizations, children ages 43 and under may not visit patients in [facility] hospitals. If the patient needs to stay at the hospital during part of their recovery, the visitor guidelines for inpatient rooms apply.  PRE-OP VISITATION  Pre-op nurse will coordinate an appropriate time for 1 support person to accompany the patient in pre-op.  This support person may not rotate.  This visitor will be contacted when the time is appropriate for the visitor to come back in the pre-op area.  Please refer to the [facility] website for the visitor guidelines for Inpatients (after your surgery is over and you are in a regular room).  You are not required to quarantine at this time prior to your surgery. However, you must do this: Hand Hygiene often Do NOT share personal items Notify your provider if you are in close contact with someone who has COVID or you develop fever 100.4 or greater, new onset of sneezing, cough, sore throat, shortness of breath or body aches.  If you test positive for Covid or have been in contact with anyone that has tested positive in the last 10 days please notify you surgeon.    Your procedure is scheduled on:  Thursday  Apr 08, 2023  Report to [facility] Main Entrance: [facility] entrance where the [employer] is available.   Report to admitting at: 09:45  AM  +++++Call this number if you have any questions or problems the morning of surgery [phone]  Do not eat food after Midnight the night prior to your surgery/procedure.  After Midnight you may have the following liquids until   09:15 AM DAY OF SURGERY  Clear Liquid Diet Water Black Coffee (sugar ok, NO MILK/CREAM OR CREAMERS)  Tea (sugar ok, NO  MILK/CREAM OR CREAMERS) regular and decaf                             Plain Jell-O  with no fruit (NO RED)                                           Fruit ices (not with fruit pulp, NO RED)                                     Popsicles (NO RED)                                                                  Juice: apple, WHITE grape, WHITE cranberry Sports drinks like Gatorade or Powerade (NO RED)                    The day of surgery:  Drink ONE (1) Pre-Surgery Clear Ensure at 09:15 AM the morning of surgery. Drink in one sitting. Do not  sip.  This drink was given to you during your hospital pre-op appointment visit. Nothing else to drink after completing the Pre-Surgery Clear Ensure : No candy, chewing gum or throat lozenges.    FOLLOW ANY ADDITIONAL PRE OP INSTRUCTIONS YOU RECEIVED FROM YOUR SURGEON'S OFFICE!!!   Oral Hygiene is also important to reduce your risk of infection.        Remember - BRUSH YOUR TEETH THE MORNING OF SURGERY WITH YOUR REGULAR TOOTHPASTE  Do NOT smoke after Midnight the night before surgery.  Take ONLY these medicines the morning of surgery with A SIP OF WATER: Levothyroxine (Synthroid), omeprazole (Prilosec), metoprolol, Duloxetine (Cymbalta), gabapentin. You may take Diltiazem if your heart rate is greater than 100. You may take Percocet if needed for severe pain.                    You may not have any metal on your body including hair pins, jewelry, and body piercing  Do not wear make-up, lotions, powders, perfumes or deodorant  Do not wear nail polish including gel and S&S, artificial / acrylic nails, or any other type of covering on natural nails including finger and toenails. If you have artificial nails, gel coating, etc., that needs to be removed by a nail salon, Please have this removed prior to surgery. Not doing so may mean that your surgery could be cancelled or delayed if the Surgeon or anesthesia staff feels like they are unable to monitor  you safely.   Do not shave 48 hours prior to surgery to avoid nicks in your skin which may contribute to postoperative infections.   Contacts, Hearing Aids, dentures or bridgework may not be worn into surgery. DENTURES WILL BE REMOVED PRIOR TO SURGERY PLEASE DO NOT APPLY "Poly grip" OR ADHESIVES!!!  You may bring a small overnight bag with you on the day of surgery, only pack items that are not valuable. [facility] IS NOT RESPONSIBLE   FOR VALUABLES THAT ARE LOST OR STOLEN.   Do not bring your home medications to the hospital. The Pharmacy will dispense medications listed on your medication list to you during your admission in the Hospital.  Special Instructions: Bring a copy of your healthcare power of attorney and living will documents the day of surgery, if you wish to have them scanned into your [facility]- EPIC  Please read over the following fact sheets you were given: IF YOU HAVE QUESTIONS ABOUT YOUR PRE-OP INSTRUCTIONS, PLEASE CALL [phone].   +++++++ PLEASE FOLLOW THE ATTACHED INFORMATION REGARDING SHOWERING / BATHING SCHEDULE  PRIOR TO YOUR SURGERY. Start this schedule on :  Sunday  Apr 04, 2023    ON THE DAY OF SURGERY : Do not apply any lotions/deodorants the morning of surgery.  Please wear clean clothes to the hospital/surgery center.    FAILURE TO FOLLOW THESE INSTRUCTIONS MAY RESULT IN THE CANCELLATION OF YOUR SURGERY  PATIENT SIGNATURE_________________________________  NURSE SIGNATURE__________________________________  ________________________________________________________________________         Rogelia Mire    An incentive spirometer is a tool that can help keep your lungs clear and active. This tool measures how well you are filling your lungs with each breath. Taking long deep breaths may help reverse or decrease the chance of developing breathing (pulmonary) problems (especially infection) following: A long period of time  when you are unable to move or be active. BEFORE THE PROCEDURE  If the spirometer includes an indicator to show your best effort, your  nurse or respiratory therapist will set it to a desired goal. If possible, sit up straight or lean slightly forward. Try not to slouch. Hold the incentive spirometer in an upright position. INSTRUCTIONS FOR USE  Sit on the edge of your bed if possible, or sit up as far as you can in bed or on a chair. Hold the incentive spirometer in an upright position. Breathe out normally. Place the mouthpiece in your mouth and seal your lips tightly around it. Breathe in slowly and as deeply as possible, raising the piston or the ball toward the top of the column. Hold your breath for 3-5 seconds or for as long as possible. Allow the piston or ball to fall to the bottom of the column. Remove the mouthpiece from your mouth and breathe out normally. Rest for a few seconds and repeat Steps 1 through 7 at least 10 times every 1-2 hours when you are awake. Take your time and take a few normal breaths between deep breaths. The spirometer may include an indicator to show your best effort. Use the indicator as a goal to work toward during each repetition. After each set of 10 deep breaths, practice coughing to be sure your lungs are clear. If you have an incision (the cut made at the time of surgery), support your incision when coughing by placing a pillow or rolled up towels firmly against it. Once you are able to get out of bed, walk around indoors and cough well. You may stop using the incentive spirometer when instructed by your caregiver.  RISKS AND COMPLICATIONS Take your time so you do not get dizzy or light-headed. If you are in pain, you may need to take or ask for pain medication before doing incentive spirometry. It is harder to take a deep breath if you are having pain. AFTER USE Rest and breathe slowly and easily. It can be helpful to keep track of a log of your  progress. Your caregiver can provide you with a simple table to help with this. If you are using the spirometer at home, follow these instructions: SEEK MEDICAL CARE IF:  You are having difficultly using the spirometer. You have trouble using the spirometer as often as instructed. Your pain medication is not giving enough relief while using the spirometer. You develop fever of 100.5 F (38.1 C) or higher.                                                                                                    SEEK IMMEDIATE MEDICAL CARE IF:  You cough up bloody sputum that had not been present before. You develop fever of 102 F (38.9 C) or greater. You develop worsening pain at or near the incision site. MAKE SURE YOU:  Understand these instructions. Will watch your condition. Will get help right away if you are not doing well or get worse. Document Released: 03/22/2007 Document Revised: 02/01/2012 Document Reviewed: 05/23/2007 [facility] Patient Information 2014 [place], [place].      WHAT IS A BLOOD TRANSFUSION? Blood Transfusion Information  A transfusion is the replacement of  blood or some of its parts. Blood is made up of multiple cells which provide different functions. Red blood cells carry oxygen and are used for blood loss replacement. White blood cells fight against infection. Platelets control bleeding. Plasma helps clot blood. Other blood products are available for specialized needs, such as hemophilia or other clotting disorders. BEFORE THE TRANSFUSION  Who gives blood for transfusions?  Healthy volunteers who are fully evaluated to make sure their blood is safe. This is blood bank blood. Transfusion therapy is the safest it has ever been in the practice of medicine. Before blood is taken from a donor, a complete history is taken to make sure that person has no history of diseases nor engages in risky social behavior (examples are intravenous drug use or sexual activity with  multiple partners). The donor's travel history is screened to minimize risk of transmitting infections, such as malaria. The donated blood is tested for signs of infectious diseases, such as HIV and hepatitis. The blood is then tested to be sure it is compatible with you in order to minimize the chance of a transfusion reaction. If you or a relative donates blood, this is often done in anticipation of surgery and is not appropriate for emergency situations. It takes many days to process the donated blood. RISKS AND COMPLICATIONS Although transfusion therapy is very safe and saves many lives, the main dangers of transfusion include:  Getting an infectious disease. Developing a transfusion reaction. This is an allergic reaction to something in the blood you were given. Every precaution is taken to prevent this. The decision to have a blood transfusion has been considered carefully by your caregiver before blood is given. Blood is not given unless the benefits outweigh the risks. AFTER THE TRANSFUSION Right after receiving a blood transfusion, you will usually feel much better and more energetic. This is especially true if your red blood cells have gotten low (anemic). The transfusion raises the level of the red blood cells which carry oxygen, and this usually causes an energy increase. The nurse administering the transfusion will monitor you carefully for complications. HOME CARE INSTRUCTIONS  No special instructions are needed after a transfusion. You may find your energy is better. Speak with your caregiver about any limitations on activity for underlying diseases you may have. SEEK MEDICAL CARE IF:  Your condition is not improving after your transfusion. You develop redness or irritation at the intravenous (IV) site. SEEK IMMEDIATE MEDICAL CARE IF:  Any of the following symptoms occur over the next 12 hours: Shaking chills. You have a temperature by mouth above 102 F (38.9 C), not controlled by  medicine. Chest, back, or muscle pain. People around you feel you are not acting correctly or are confused. Shortness of breath or difficulty breathing. Dizziness and fainting. You get a rash or develop hives. You have a decrease in urine output. Your urine turns a dark color or changes to pink, red, or brown. Any of the following symptoms occur over the next 10 days: You have a temperature by mouth above 102 F (38.9 C), not controlled by medicine. Shortness of breath. Weakness after normal activity. The white part of the eye turns yellow (jaundice). You have a decrease in the amount of urine or are urinating less often. Your urine turns a dark color or changes to pink, red, or brown. Document Released: 11/06/2000 Document Revised: 02/01/2012 Document Reviewed: 06/25/2008 [facility] Patient Information 2014 [place], [place].  _______________________________________________________________________

## 2023-03-29 ENCOUNTER — Encounter (HOSPITAL_COMMUNITY)
Admission: RE | Admit: 2023-03-29 | Discharge: 2023-03-29 | Disposition: A | Discharge status: Home / Self Care | Payer: Medicare Other | Source: Ambulatory Visit | Attending: Orthopedic Surgery | Admitting: Orthopedic Surgery

## 2023-03-29 ENCOUNTER — Other Ambulatory Visit: Payer: Self-pay

## 2023-03-29 ENCOUNTER — Encounter (HOSPITAL_COMMUNITY): Payer: Self-pay

## 2023-03-29 VITALS — BP 139/84 | HR 72 | Temp 98.7°F | Resp 20 | Ht 64.0 in | Wt 207.0 lb

## 2023-03-29 DIAGNOSIS — I10 Essential (primary) hypertension: Secondary | ICD-10-CM | POA: Insufficient documentation

## 2023-03-29 DIAGNOSIS — K219 Gastro-esophageal reflux disease without esophagitis: Secondary | ICD-10-CM | POA: Insufficient documentation

## 2023-03-29 DIAGNOSIS — M1612 Unilateral primary osteoarthritis, left hip: Secondary | ICD-10-CM | POA: Diagnosis not present

## 2023-03-29 DIAGNOSIS — Z853 Personal history of malignant neoplasm of breast: Secondary | ICD-10-CM | POA: Diagnosis not present

## 2023-03-29 DIAGNOSIS — I480 Paroxysmal atrial fibrillation: Secondary | ICD-10-CM | POA: Insufficient documentation

## 2023-03-29 DIAGNOSIS — Z01812 Encounter for preprocedural laboratory examination: Secondary | ICD-10-CM | POA: Diagnosis not present

## 2023-03-29 DIAGNOSIS — I2510 Atherosclerotic heart disease of native coronary artery without angina pectoris: Secondary | ICD-10-CM | POA: Insufficient documentation

## 2023-03-29 DIAGNOSIS — Z7901 Long term (current) use of anticoagulants: Secondary | ICD-10-CM | POA: Diagnosis not present

## 2023-03-29 DIAGNOSIS — Z01818 Encounter for other preprocedural examination: Secondary | ICD-10-CM

## 2023-03-29 HISTORY — DX: Headache, unspecified: R51.9

## 2023-03-29 HISTORY — DX: Anemia, unspecified: D64.9

## 2023-03-29 LAB — BASIC METABOLIC PANEL
Anion gap: 11 (ref 5–15)
BUN: 14 mg/dL (ref 8–23)
CO2: 32 mmol/L (ref 22–32)
Calcium: 9.4 mg/dL (ref 8.9–10.3)
Chloride: 95 mmol/L — ABNORMAL LOW (ref 98–111)
Creatinine, Ser: 0.87 mg/dL (ref 0.44–1.00)
GFR, Estimated: >60 mL/min (ref >60)
Glucose, Bld: 127 mg/dL — ABNORMAL HIGH (ref 70–99)
Potassium: 3.7 mmol/L (ref 3.5–5.1)
Sodium: 138 mmol/L (ref 135–145)

## 2023-03-29 LAB — TYPE AND SCREEN
ABO/RH(D): A NEG
Antibody Screen: NEGATIVE

## 2023-03-29 LAB — CBC
HCT: 38.4 % (ref 36.0–46.0)
Hemoglobin: 11.8 g/dL — ABNORMAL LOW (ref 12.0–15.0)
MCH: 27.4 pg (ref 26.0–34.0)
MCHC: 30.7 g/dL (ref 30.0–36.0)
MCV: 89.1 fL (ref 80.0–100.0)
Platelets: 272 10*3/uL (ref 150–400)
RBC: 4.31 MIL/uL (ref 3.87–5.11)
RDW: 15.4 % (ref 11.5–15.5)
WBC: 6.5 10*3/uL (ref 4.0–10.5)
nRBC: 0 % (ref 0.0–0.2)

## 2023-03-29 LAB — SURGICAL PCR SCREEN
MRSA, PCR: NEGATIVE
Staphylococcus aureus: POSITIVE — AB

## 2023-03-29 NOTE — Progress Notes (Signed)
Patient's PCR screen is positive for  [facility]. Appropriate notes have been placed on the patient's chart. This note has been routed to Dr.Swinteck for review. The Patient's surgery is currently scheduled for:04-08-2023 at [facility]  Rudean Haskell, BSN, CVRN-BC   Pre-Surgical Testing Nurse [facility]  [phone]

## 2023-03-30 NOTE — Progress Notes (Signed)
Anesthesia chart review   Case: [id number] Date/Time: 04/08/23 1200   Procedure: TOTAL HIP ARTHROPLASTY ANTERIOR APPROACH (Left: Hip) - 150   Anesthesia type: Spinal   Pre-op diagnosis: Left hip osteoarthritis   [facility] ROOM 09 / [facility]   Surgeons: Samson Frederic, MD       DISCUSSION: 67 year old never smoker with history of HTN, GERD, breast cancer, mild aortic valve stenosis (valve area 1.3 cm, mean gradient 15.0 mmHg), paroxysmal atrial fibrillation, left hip OA scheduled for above procedure 04/08/2023 with Dr. Samson Frederic.  Patient last seen by cardiology 02/10/2023.  Per office visit note, "I think she is at acceptable risk to undergo orthopedic surgery. No ischemic or heart failure symptoms today. Echo shows normal ejection fraction recently. It is okay for her to hold her anticoagulant for 2 to 3 days prior to the planned surgery and restart when felt safe from a surgical perspective. She should continue her metoprolol in the perioperative period."  3-day hold of Eliquis cleared by cardiology.  Patient reports she will be holding Eliquis 3 days prior to procedure.   VS: BP 139/84 Comment: Right arm sitting  Pulse 72   Temp 37.1 C (Oral)   Resp 20   Ht 5\' 4"  (1.626 m)   Wt 93.9 kg   LMP  (LMP Unknown)   SpO2 100%   BMI 35.53 kg/m   PROVIDERS: Adrian Prince, MD is PCP  Lance Muss, MD is Cardiologist  LABS: Labs reviewed: Acceptable for surgery. (all labs ordered are listed, but only abnormal results are displayed)  Labs Reviewed  SURGICAL PCR SCREEN - Abnormal; Notable for the following components:      Result Value   Staphylococcus aureus POSITIVE (*)    All other components within normal limits  BASIC METABOLIC PANEL - Abnormal; Notable for the following components:   Chloride 95 (*)    Glucose, Bld 127 (*)    All other components within normal limits  CBC - Abnormal; Notable for the following components:   Hemoglobin 11.8 (*)    All other  components within normal limits  TYPE AND SCREEN     IMAGES:   EKG:   CV: Echo 02/10/2023 1. Left ventricular ejection fraction, by estimation, is 60 to 65%. The  left ventricle has normal function. The left ventricle has no regional  wall motion abnormalities. Left ventricular diastolic parameters are  consistent with Grade I diastolic  dysfunction (impaired relaxation).   2. Right ventricular systolic function is normal. The right ventricular  size is normal. There is normal pulmonary artery systolic pressure.   3. The mitral valve is normal in structure. Trivial mitral valve  regurgitation. No evidence of mitral stenosis.   4. The aortic valve is tricuspid. There is moderate calcification of the  aortic valve. Aortic valve regurgitation is not visualized. Mild aortic  valve stenosis. Aortic valve area, by VTI measures 1.38 cm. Aortic valve  mean gradient measures 15.0 mmHg.  Aortic valve Vmax measures 2.42 m/s.   5. The inferior vena cava is normal in size with greater than 50%  respiratory variability, suggesting right atrial pressure of 3 mmHg.   Past Medical History:  Diagnosis Date   Anemia    Anxiety    Breast cancer (HCC)    Right breast, had lumpectomy only no chemo, radiation   Cancer (HCC) 10/2016   Breast right,  lumpectomy,  did not require chemo/xrt   DDD (degenerative disc disease), cervical    Depression  Dyspnea    Dysrhythmia    A Fib   Elevated cholesterol    GERD (gastroesophageal reflux disease)    H/O hiatal hernia    Headache    Hypertension    Hypothyroidism    Paroxysmal atrial fibrillation (HCC) 11/20/2020   PONV (postoperative nausea and vomiting)    Tremor    in hands and down left leg   Wears partial dentures    upper    Past Surgical History:  Procedure Laterality Date   ABDOMINAL HYSTERECTOMY  yrs ago   ANTERIOR LAT LUMBAR FUSION N/A 11/21/2019   Procedure: Lumbar two-three Lumbar three-four Anterolateral lumbar interbody  fusion with lateral plate;  Surgeon: Barnett Abu, MD;  Location: [facility] OR;  Service: Neurosurgery;  Laterality: N/A;   BREAST EXCISIONAL BIOPSY Right 11/2016   ALH and papillomas   BREAST LUMPECTOMY Right 2017   BREAST LUMPECTOMY WITH RADIOACTIVE SEED LOCALIZATION Right 12/15/2016   Procedure: RADIOACTIVE SEED X 2 GUIDED EXCISIONAL BREAST BIOPSY;  Surgeon: Emelia Loron, MD;  Location: [facility];  Service: General;  Laterality: Right;   CARPAL TUNNEL RELEASE Bilateral yrs ago   x 2   cervical neck fusion  yrs ago   C 3 and C 4    COLONOSCOPY     ESOPHAGOGASTRODUODENOSCOPY ENDOSCOPY N/A 01/09/2022   Procedure: INTRAOPERATIVE ENDOSCOPY;  Surgeon: Axel Filler, MD;  Location: [facility] OR;  Service: General;  Laterality: N/A;   EYE SURGERY Bilateral    Cataracts removed   left knee arthroscopy   last done 2008   x 3   left shoulder arthroscopy  yrs ago   lower back surgery  2005   right total knee replacement   2008   TOTAL KNEE ARTHROPLASTY  01/14/2012   Procedure: TOTAL KNEE ARTHROPLASTY;  Surgeon: Javier Docker, MD;  [facility];  Service: Orthopedics;  Laterality: Left;  preop femoral nerve block   UPPER GI ENDOSCOPY     WISDOM TOOTH EXTRACTION      MEDICATIONS:  apixaban (ELIQUIS) 5 MG TABS tablet   Camphor-Menthol-Methyl Sal (SALONPAS) 3.11-28-08 % PTCH   colchicine 0.6 MG tablet   diltiazem (CARDIZEM) 30 MG tablet   DULoxetine (CYMBALTA) 30 MG capsule   DULoxetine (CYMBALTA) 60 MG capsule   furosemide (LASIX) 20 MG tablet   gabapentin (NEURONTIN) 600 MG tablet   hydrochlorothiazide (HYDRODIURIL) 25 MG tablet   levothyroxine (SYNTHROID) 200 MCG tablet   methocarbamol (ROBAXIN) 500 MG tablet   metoprolol succinate (TOPROL-XL) 100 MG 24 hr tablet   omeprazole (PRILOSEC) 20 MG capsule   oxyCODONE-acetaminophen (PERCOCET/ROXICET) 5-325 MG tablet   No current facility-administered medications for this encounter.   Jodell Cipro Ward, PA-C [facility]  Pre-Surgical Testing [phone]

## 2023-03-30 NOTE — Anesthesia Preprocedure Evaluation (Addendum)
Anesthesia Evaluation  Patient identified by MRN, date of birth, ID band Patient awake    Reviewed: Allergy & Precautions, NPO status , Patient's Chart, lab work & pertinent test results, reviewed documented beta blocker date and time   History of Anesthesia Complications (+) PONV and history of anesthetic complications  Airway Mallampati: III  TM Distance: <3 FB Neck ROM: Full   Comment: Previous airway note Preoxygenation: Pre-oxygenation with 100% oxygen Induction Type: IV induction Ventilation: Mask ventilation without difficulty Laryngoscope Size: Mac and 3 Grade View: Grade I Tube type: Oral Number of attempts: 1 Airway Equipment and Method: Stylet and Oral airway Placement Confirmation: ETT inserted through vocal cords under direct vision, positive ETCO2 and breath sounds checked- equal and bilateral Tube secured with: Tape Dental Injury: Teeth and Oropharynx as per pre-operative assessment   Dental  (+) Partial Upper, Dental Advisory Given, Poor Dentition, Chipped, Missing,    Pulmonary shortness of breath and with exertion   Pulmonary exam normal breath sounds clear to auscultation   (-) rales    Cardiovascular hypertension, Pt. on medications and Pt. on home beta blockers + dysrhythmias Atrial Fibrillation + Valvular Problems/Murmurs AS  Rhythm:Regular Rate:Normal  EKG 10/01/21 NSR, Non specific ST-T wave abnormalities  CV: Echo 02/10/2023 1. Left ventricular ejection fraction, by estimation, is 60 to 65%. The  left ventricle has normal function. The left ventricle has no regional  wall motion abnormalities. Left ventricular diastolic parameters are  consistent with Grade I diastolic  dysfunction (impaired relaxation).   2. Right ventricular systolic function is normal. The right ventricular  size is normal. There is normal pulmonary artery systolic pressure.   3. The mitral valve is normal in structure. Trivial  mitral valve  regurgitation. No evidence of mitral stenosis.   4. The aortic valve is tricuspid. There is moderate calcification of the  aortic valve. Aortic valve regurgitation is not visualized. Mild aortic  valve stenosis. Aortic valve area, by VTI measures 1.38 cm. Aortic valve  mean gradient measures 15.0 mmHg.  Aortic valve Vmax measures 2.42 m/s.   5. The inferior vena cava is normal in size with greater than 50%  respiratory variability, suggesting right atrial pressure of 3 mmHg.     Neuro/Psych  PSYCHIATRIC DISORDERS Anxiety Depression     Neuromuscular disease    GI/Hepatic hiatal hernia,GERD  Medicated,,  Endo/Other  Hypothyroidism  Hyperlipidemia Obesity  Renal/GU   negative genitourinary   Musculoskeletal  (+) Arthritis , Osteoarthritis,  Hx/o cervical fusion Chronic lumbar radiculopathy   Abdominal  (+) + obese  Peds  Hematology  (+) Blood dyscrasia, anemia Xarelto therapy- last dose 6 days ago   Anesthesia Other Findings   Reproductive/Obstetrics                             Anesthesia Physical Anesthesia Plan  ASA: 3  Anesthesia Plan: General   Post-op Pain Management: Ofirmev IV (intra-op)*, Precedex and Dilaudid IV   Induction: Intravenous, Rapid sequence and Cricoid pressure planned  PONV Risk Score and Plan: 4 or greater and Treatment may vary due to age or medical condition, Ondansetron, Dexamethasone and TIVA  Airway Management Planned: Oral ETT and LMA  Additional Equipment: None  Intra-op Plan:   Post-operative Plan: Extubation in OR  Informed Consent: I have reviewed the patients History and Physical, chart, labs and discussed the procedure including the risks, benefits and alternatives for the proposed anesthesia with the patient or authorized  representative who has indicated his/her understanding and acceptance.       Plan Discussed with: Anesthesiologist and CRNA  Anesthesia Plan Comments: (See PAT  note 03/29/2023   DISCUSSION: 67 year old never smoker with history of HTN, GERD, breast cancer, mild aortic valve stenosis (valve area 1.3 cm, mean gradient 15.0 mmHg), paroxysmal atrial fibrillation, left hip OA scheduled for above procedure 04/08/2023 with Dr. Samson Frederic.   Patient last seen by cardiology 02/10/2023.  Per office visit note, "I think she is at acceptable risk to undergo orthopedic surgery. No ischemic or heart failure symptoms today. Echo shows normal ejection fraction recently. It is okay for her to hold her anticoagulant for 2 to 3 days prior to the planned surgery and restart when felt safe from a surgical perspective. She should continue her metoprolol in the perioperative period."   3-day hold of Eliquis cleared by cardiology.  Patient reports she will be holding Eliquis 3 days prior to procedure.   )        Anesthesia Quick Evaluation

## 2023-03-31 DIAGNOSIS — F1120 Opioid dependence, uncomplicated: Secondary | ICD-10-CM | POA: Diagnosis not present

## 2023-03-31 DIAGNOSIS — M1612 Unilateral primary osteoarthritis, left hip: Secondary | ICD-10-CM | POA: Diagnosis not present

## 2023-03-31 DIAGNOSIS — M5416 Radiculopathy, lumbar region: Secondary | ICD-10-CM | POA: Diagnosis not present

## 2023-03-31 DIAGNOSIS — M48062 Spinal stenosis, lumbar region with neurogenic claudication: Secondary | ICD-10-CM | POA: Diagnosis not present

## 2023-03-31 DIAGNOSIS — Z6834 Body mass index (BMI) 34.0-34.9, adult: Secondary | ICD-10-CM | POA: Diagnosis not present

## 2023-04-05 ENCOUNTER — Telehealth: Payer: Self-pay | Admitting: Hematology and Oncology

## 2023-04-05 ENCOUNTER — Inpatient Hospital Stay: Payer: Medicare Other | Admitting: Hematology and Oncology

## 2023-04-07 ENCOUNTER — Ambulatory Visit: Payer: Self-pay | Admitting: Student

## 2023-04-07 NOTE — H&P (Signed)
TOTAL HIP ADMISSION H&P  Patient is admitted for left total hip arthroplasty.  Subjective:  Chief Complaint: left hip pain  HPI: Kathryn [name], 67 y.o. female, has a history of pain and functional disability in the left hip(s) due to arthritis and patient has failed non-surgical conservative treatments for greater than 12 weeks to include NSAID's and/or analgesics, flexibility and strengthening excercises, use of assistive devices, and activity modification.  Onset of symptoms was gradual starting 10 years ago with rapidlly worsening course since that time.The patient noted no past surgery on the left hip(s).  Patient currently rates pain in the left hip at 10 out of 10 with activity. Patient has night pain, worsening of pain with activity and weight bearing, trendelenberg gait, pain that interfers with activities of daily living, and pain with passive range of motion. Patient has evidence of subchondral cysts, subchondral sclerosis, periarticular osteophytes, and joint space narrowing by imaging studies. This condition presents safety issues increasing the risk of falls. There is no current active infection.  Patient Active Problem List   Diagnosis Date Noted   Aortic atherosclerosis (HCC) 04/09/2022   Esophageal achalasia 01/09/2022   Chronic left-sided lumbar radiculopathy 03/27/2021   Lumbar radiculopathy, chronic 11/21/2019   Spondylolisthesis of lumbar region 07/04/2019   Spondylolisthesis at L4-L5 level 07/04/2019   Atypical lobular hyperplasia [facility]) of right breast 02/09/2017   Osteoarthritis of knee 01/15/2012   HYPOTHYROIDISM 07/26/2008   HYPERLIPIDEMIA 07/26/2008   HYPERTENSION 07/26/2008   DYSPNEA 07/26/2008   CARPAL TUNNEL SYNDROME, HX OF 07/26/2008   Past Medical History:  Diagnosis Date   Anemia    Anxiety    Breast cancer (HCC)    Right breast, had lumpectomy only no chemo, radiation   Cancer (HCC) 10/2016   Breast right,  lumpectomy,  did not require chemo/xrt    DDD (degenerative disc disease), cervical    Depression    Dyspnea    Dysrhythmia    A Fib   Elevated cholesterol    GERD (gastroesophageal reflux disease)    H/O hiatal hernia    Headache    Hypertension    Hypothyroidism    Paroxysmal atrial fibrillation (HCC) 11/20/2020   PONV (postoperative nausea and vomiting)    Tremor    in hands and down left leg   Wears partial dentures    upper    Past Surgical History:  Procedure Laterality Date   ABDOMINAL HYSTERECTOMY  yrs ago   ANTERIOR LAT LUMBAR FUSION N/A 11/21/2019   Procedure: Lumbar two-three Lumbar three-four Anterolateral lumbar interbody fusion with lateral plate;  Surgeon: Barnett Abu, MD;  Location: [facility] OR;  Service: Neurosurgery;  Laterality: N/A;   BREAST EXCISIONAL BIOPSY Right 11/2016   ALH and papillomas   BREAST LUMPECTOMY Right 2017   BREAST LUMPECTOMY WITH RADIOACTIVE SEED LOCALIZATION Right 12/15/2016   Procedure: RADIOACTIVE SEED X 2 GUIDED EXCISIONAL BREAST BIOPSY;  Surgeon: Emelia Loron, MD;  Location: [facility];  Service: General;  Laterality: Right;   CARPAL TUNNEL RELEASE Bilateral yrs ago   x 2   cervical neck fusion  yrs ago   C 3 and C 4    COLONOSCOPY     ESOPHAGOGASTRODUODENOSCOPY ENDOSCOPY N/A 01/09/2022   Procedure: INTRAOPERATIVE ENDOSCOPY;  Surgeon: Axel Filler, MD;  Location[facility] OR;  Service: General;  Laterality: N/A;   EYE SURGERY Bilateral    Cataracts removed   left knee arthroscopy   last done 2008   x 3   left shoulder  arthroscopy  yrs ago   lower back surgery  2005   right total knee replacement   2008   TOTAL KNEE ARTHROPLASTY  01/14/2012   Procedure: TOTAL KNEE ARTHROPLASTY;  Surgeon: Javier Docker, MD;  Location: [facility] ORS;  Service: Orthopedics;  Laterality: Left;  preop femoral nerve block   UPPER GI ENDOSCOPY     WISDOM TOOTH EXTRACTION      Current Outpatient Medications  Medication Sig Dispense Refill Last Dose   apixaban (ELIQUIS) 5 MG TABS  tablet Take 5 mg by mouth at bedtime.      Camphor-Menthol-Methyl Sal (SALONPAS) 3.11-28-08 % PTCH Place 1 patch onto the skin daily as needed (left hip pain).      colchicine 0.6 MG tablet Take 0.6 mg by mouth as needed (gout flare).      diltiazem (CARDIZEM) 30 MG tablet Take 1 tablet every 4 hours AS NEEDED for heart rate >100 as long as blood pressure >100. 30 tablet 1    DULoxetine (CYMBALTA) 30 MG capsule Take 30 mg by mouth daily.      DULoxetine (CYMBALTA) 60 MG capsule Take 60 mg by mouth See admin instructions.      furosemide (LASIX) 20 MG tablet TAKE 1 TABLET BY MOUTH DAILY AS NEEDED 30 tablet 11    gabapentin (NEURONTIN) 600 MG tablet Take 600 mg by mouth 3 (three) times daily.      hydrochlorothiazide (HYDRODIURIL) 25 MG tablet Take 1 tablet (25 mg total) by mouth daily. 30 tablet 11    levothyroxine (SYNTHROID) 200 MCG tablet Take 200 mcg by mouth daily before breakfast.      methocarbamol (ROBAXIN) 500 MG tablet Take 1 tablet (500 mg total) by mouth every 6 (six) hours as needed for muscle spasms. 40 tablet 3    metoprolol succinate (TOPROL-XL) 100 MG 24 hr tablet TAKE 1 TABLET BY MOUTH DAILY WITH OR IMMEDIATELY FOLLOWING A MEAL 90 tablet 3    omeprazole (PRILOSEC) 20 MG capsule Take 20 mg by mouth 2 (two) times daily.      oxyCODONE-acetaminophen (PERCOCET/ROXICET) 5-325 MG tablet Take 1 tablet by mouth every 8 (eight) hours as needed for moderate pain.      No current facility-administered medications for this visit.   No Known Allergies  Social History   Tobacco Use   Smoking status: Never   Smokeless tobacco: Never  Substance Use Topics   Alcohol use: Yes    Comment: occasional wine    Family History  Problem Relation Age of Onset   Heart disease Father    Stroke Brother    Breast cancer Neg Hx      Review of Systems  Musculoskeletal:  Positive for arthralgias, back pain and gait problem.  All other systems reviewed and are negative.   Objective:  Physical  Exam Constitutional:      Appearance: Normal appearance.  HENT:     Head: Normocephalic and atraumatic.     Nose: Nose normal.     Mouth/Throat:     Mouth: Mucous membranes are moist.     Pharynx: Oropharynx is clear.  Eyes:     Conjunctiva/sclera: Conjunctivae normal.  Cardiovascular:     Rate and Rhythm: Normal rate and regular rhythm.     Pulses: Normal pulses.     Heart sounds: Normal heart sounds.  Pulmonary:     Effort: Pulmonary effort is normal.     Breath sounds: Normal breath sounds.  Abdominal:     General: Abdomen  is flat.     Palpations: Abdomen is soft.  Genitourinary:    Comments: deferred Musculoskeletal:     Cervical back: Normal range of motion and neck supple.     Comments: Examination of the left hip reveals no skin wounds or lesions. No significant trochanteric tenderness to palpation. Her left hip is extremely stiff. She has a 20 degree flexion contracture, and I can flex her up to 90 degrees. She internally rotates 0 degrees, and externally rotates 30 degrees. Pain with terminal flexion and rotation. Pain in the position of impingement. Positive Stinchfield.  She is neurovascularly intact distally.  Skin:    General: Skin is warm and dry.     Capillary Refill: Capillary refill takes less than 2 seconds.  Neurological:     General: No focal deficit present.     Mental Status: She is alert and oriented to person, place, and time.  Psychiatric:        Mood and Affect: Mood normal.        Behavior: Behavior normal.        Thought Content: Thought content normal.        Judgment: Judgment normal.     Vital signs in last 24 hours: @VSRANGES @  Labs:   Estimated body mass index is 35.53 kg/m as calculated from the following:   Height as of 03/29/23: 5\' 4"  (1.626 m).   Weight as of 03/29/23: 93.9 kg.   Imaging Review Plain radiographs demonstrate severe degenerative joint disease of the left hip(s). The bone quality appears to be adequate for age and  reported activity level.      Assessment/Plan:  End stage arthritis, left hip(s)  The patient history, physical examination, clinical judgement of the provider and imaging studies are consistent with end stage degenerative joint disease of the left hip(s) and total hip arthroplasty is deemed medically necessary. The treatment options including medical management, injection therapy, arthroscopy and arthroplasty were discussed at length. The risks and benefits of total hip arthroplasty were presented and reviewed. The risks due to aseptic loosening, infection, stiffness, dislocation/subluxation,  thromboembolic complications and other imponderables were discussed.  The patient acknowledged the explanation, agreed to proceed with the plan and consent was signed. Patient is being admitted for inpatient treatment for surgery, pain control, PT, OT, prophylactic antibiotics, VTE prophylaxis, progressive ambulation and ADL's and discharge planning.The patient is planning to be discharged home with HEP after an overnight stay.    Therapy Plans: HEP. Disposition: Home with husband and daughter Planned DVT Prophylaxis: Eliquis 5mg  BID DME needed: Has rolling walker.  PCP: Cleared.  Cardiology: Cleared.  TXA: IV Allergies: NDKA.  Anesthesia Concerns: Sometimes nauseated. None.  BMI: 36.4 Last HgbA1c: Not diabetic Other: - A-fib, Eliquis, Need to stop 3 days prior to surgery date.  - Chronic low back pain. Oxycodone 7.5mg  3-4 times per day.  - No NSAIDS.  - Oxycodone, zofran.  - 03/29/23: Hgb 11.8, K+ 3.7, Cr. 0.87.     Patient's anticipated LOS is less than 2 midnights, meeting these requirements: - Younger than 84 - Lives within 1 hour of care - Has a competent adult at home to recover with post-op recover - NO history of  - Chronic pain requiring opiods  - Diabetes  - Coronary Artery Disease  - Heart failure  - Heart attack  - Stroke  - DVT/VTE  - Cardiac arrhythmia  - Respiratory  Failure/COPD  - Renal failure  - Anemia  - Advanced Liver disease

## 2023-04-07 NOTE — H&P (View-Only) (Signed)
TOTAL HIP ADMISSION H&P  Patient is admitted for left total hip arthroplasty.  Subjective:  Chief Complaint: left hip pain  HPI: Kathryn [name], 67 y.o. female, has a history of pain and functional disability in the left hip(s) due to arthritis and patient has failed non-surgical conservative treatments for greater than 12 weeks to include NSAID's and/or analgesics, flexibility and strengthening excercises, use of assistive devices, and activity modification.  Onset of symptoms was gradual starting 10 years ago with rapidlly worsening course since that time.The patient noted no past surgery on the left hip(s).  Patient currently rates pain in the left hip at 10 out of 10 with activity. Patient has night pain, worsening of pain with activity and weight bearing, trendelenberg gait, pain that interfers with activities of daily living, and pain with passive range of motion. Patient has evidence of subchondral cysts, subchondral sclerosis, periarticular osteophytes, and joint space narrowing by imaging studies. This condition presents safety issues increasing the risk of falls. There is no current active infection.  Patient Active Problem List   Diagnosis Date Noted   Aortic atherosclerosis (HCC) 04/09/2022   Esophageal achalasia 01/09/2022   Chronic left-sided lumbar radiculopathy 03/27/2021   Lumbar radiculopathy, chronic 11/21/2019   Spondylolisthesis of lumbar region 07/04/2019   Spondylolisthesis at L4-L5 level 07/04/2019   Atypical lobular hyperplasia (ALH) of right breast 02/09/2017   Osteoarthritis of knee 01/15/2012   HYPOTHYROIDISM 07/26/2008   HYPERLIPIDEMIA 07/26/2008   HYPERTENSION 07/26/2008   DYSPNEA 07/26/2008   CARPAL TUNNEL SYNDROME, HX OF 07/26/2008   Past Medical History:  Diagnosis Date   Anemia    Anxiety    Breast cancer (HCC)    Right breast, had lumpectomy only no chemo, radiation   Cancer (HCC) 10/2016   Breast right,  lumpectomy,  did not require chemo/xrt    DDD (degenerative disc disease), cervical    Depression    Dyspnea    Dysrhythmia    A Fib   Elevated cholesterol    GERD (gastroesophageal reflux disease)    H/O hiatal hernia    Headache    Hypertension    Hypothyroidism    Paroxysmal atrial fibrillation (HCC) 11/20/2020   PONV (postoperative nausea and vomiting)    Tremor    in hands and down left leg   Wears partial dentures    upper    Past Surgical History:  Procedure Laterality Date   ABDOMINAL HYSTERECTOMY  yrs ago   ANTERIOR LAT LUMBAR FUSION N/A 11/21/2019   Procedure: Lumbar two-three Lumbar three-four Anterolateral lumbar interbody fusion with lateral plate;  Surgeon: Elsner, Henry, MD;  Location: [facility] OR;  Service: Neurosurgery;  Laterality: N/A;   BREAST EXCISIONAL BIOPSY Right 11/2016   ALH and papillomas   BREAST LUMPECTOMY Right 2017   BREAST LUMPECTOMY WITH RADIOACTIVE SEED LOCALIZATION Right 12/15/2016   Procedure: RADIOACTIVE SEED X 2 GUIDED EXCISIONAL BREAST BIOPSY;  Surgeon: Matthew Wakefield, MD;  Location: [facility];  Service: General;  Laterality: Right;   CARPAL TUNNEL RELEASE Bilateral yrs ago   x 2   cervical neck fusion  yrs ago   C 3 and C 4    COLONOSCOPY     ESOPHAGOGASTRODUODENOSCOPY ENDOSCOPY N/A 01/09/2022   Procedure: INTRAOPERATIVE ENDOSCOPY;  Surgeon: Ramirez, Armando, MD;  Location: [facility] OR;  Service: General;  Laterality: N/A;   EYE SURGERY Bilateral    Cataracts removed   left knee arthroscopy   last done 2008   x 3   left shoulder   arthroscopy  yrs ago   lower back surgery  2005   right total knee replacement   2008   TOTAL KNEE ARTHROPLASTY  01/14/2012   Procedure: TOTAL KNEE ARTHROPLASTY;  Surgeon: Jeffrey C Beane, MD;  Location: [facility] ORS;  Service: Orthopedics;  Laterality: Left;  preop femoral nerve block   UPPER GI ENDOSCOPY     WISDOM TOOTH EXTRACTION      Current Outpatient Medications  Medication Sig Dispense Refill Last Dose   apixaban (ELIQUIS) 5 MG TABS  tablet Take 5 mg by mouth at bedtime.      Camphor-Menthol-Methyl Sal (SALONPAS) 3.11-28-08 % PTCH Place 1 patch onto the skin daily as needed (left hip pain).      colchicine 0.6 MG tablet Take 0.6 mg by mouth as needed (gout flare).      diltiazem (CARDIZEM) 30 MG tablet Take 1 tablet every 4 hours AS NEEDED for heart rate >100 as long as blood pressure >100. 30 tablet 1    DULoxetine (CYMBALTA) 30 MG capsule Take 30 mg by mouth daily.      DULoxetine (CYMBALTA) 60 MG capsule Take 60 mg by mouth See admin instructions.      furosemide (LASIX) 20 MG tablet TAKE 1 TABLET BY MOUTH DAILY AS NEEDED 30 tablet 11    gabapentin (NEURONTIN) 600 MG tablet Take 600 mg by mouth 3 (three) times daily.      hydrochlorothiazide (HYDRODIURIL) 25 MG tablet Take 1 tablet (25 mg total) by mouth daily. 30 tablet 11    levothyroxine (SYNTHROID) 200 MCG tablet Take 200 mcg by mouth daily before breakfast.      methocarbamol (ROBAXIN) 500 MG tablet Take 1 tablet (500 mg total) by mouth every 6 (six) hours as needed for muscle spasms. 40 tablet 3    metoprolol succinate (TOPROL-XL) 100 MG 24 hr tablet TAKE 1 TABLET BY MOUTH DAILY WITH OR IMMEDIATELY FOLLOWING A MEAL 90 tablet 3    omeprazole (PRILOSEC) 20 MG capsule Take 20 mg by mouth 2 (two) times daily.      oxyCODONE-acetaminophen (PERCOCET/ROXICET) 5-325 MG tablet Take 1 tablet by mouth every 8 (eight) hours as needed for moderate pain.      No current facility-administered medications for this visit.   No Known Allergies  Social History   Tobacco Use   Smoking status: Never   Smokeless tobacco: Never  Substance Use Topics   Alcohol use: Yes    Comment: occasional wine    Family History  Problem Relation Age of Onset   Heart disease Father    Stroke Brother    Breast cancer Neg Hx      Review of Systems  Musculoskeletal:  Positive for arthralgias, back pain and gait problem.  All other systems reviewed and are negative.   Objective:  Physical  Exam Constitutional:      Appearance: Normal appearance.  HENT:     Head: Normocephalic and atraumatic.     Nose: Nose normal.     Mouth/Throat:     Mouth: Mucous membranes are moist.     Pharynx: Oropharynx is clear.  Eyes:     Conjunctiva/sclera: Conjunctivae normal.  Cardiovascular:     Rate and Rhythm: Normal rate and regular rhythm.     Pulses: Normal pulses.     Heart sounds: Normal heart sounds.  Pulmonary:     Effort: Pulmonary effort is normal.     Breath sounds: Normal breath sounds.  Abdominal:     General: Abdomen   is flat.     Palpations: Abdomen is soft.  Genitourinary:    Comments: deferred Musculoskeletal:     Cervical back: Normal range of motion and neck supple.     Comments: Examination of the left hip reveals no skin wounds or lesions. No significant trochanteric tenderness to palpation. Her left hip is extremely stiff. She has a 20 degree flexion contracture, and I can flex her up to 90 degrees. She internally rotates 0 degrees, and externally rotates 30 degrees. Pain with terminal flexion and rotation. Pain in the position of impingement. Positive Stinchfield.  She is neurovascularly intact distally.  Skin:    General: Skin is warm and dry.     Capillary Refill: Capillary refill takes less than 2 seconds.  Neurological:     General: No focal deficit present.     Mental Status: She is alert and oriented to person, place, and time.  Psychiatric:        Mood and Affect: Mood normal.        Behavior: Behavior normal.        Thought Content: Thought content normal.        Judgment: Judgment normal.     Vital signs in last 24 hours: @VSRANGES@  Labs:   Estimated body mass index is 35.53 kg/m as calculated from the following:   Height as of 03/29/23: 5' 4" (1.626 m).   Weight as of 03/29/23: 93.9 kg.   Imaging Review Plain radiographs demonstrate severe degenerative joint disease of the left hip(s). The bone quality appears to be adequate for age and  reported activity level.      Assessment/Plan:  End stage arthritis, left hip(s)  The patient history, physical examination, clinical judgement of the provider and imaging studies are consistent with end stage degenerative joint disease of the left hip(s) and total hip arthroplasty is deemed medically necessary. The treatment options including medical management, injection therapy, arthroscopy and arthroplasty were discussed at length. The risks and benefits of total hip arthroplasty were presented and reviewed. The risks due to aseptic loosening, infection, stiffness, dislocation/subluxation,  thromboembolic complications and other imponderables were discussed.  The patient acknowledged the explanation, agreed to proceed with the plan and consent was signed. Patient is being admitted for inpatient treatment for surgery, pain control, PT, OT, prophylactic antibiotics, VTE prophylaxis, progressive ambulation and ADL's and discharge planning.The patient is planning to be discharged home with HEP after an overnight stay.    Therapy Plans: HEP. Disposition: Home with husband and daughter Planned DVT Prophylaxis: Eliquis 5mg BID DME needed: Has rolling walker.  PCP: Cleared.  Cardiology: Cleared.  TXA: IV Allergies: NDKA.  Anesthesia Concerns: Sometimes nauseated. None.  BMI: 36.4 Last HgbA1c: Not diabetic Other: - A-fib, Eliquis, Need to stop 3 days prior to surgery date.  - Chronic low back pain. Oxycodone 7.5mg 3-4 times per day.  - No NSAIDS.  - Oxycodone, zofran.  - 03/29/23: Hgb 11.8, K+ 3.7, Cr. 0.87.     Patient's anticipated LOS is less than 2 midnights, meeting these requirements: - Younger than 65 - Lives within 1 hour of care - Has a competent adult at home to recover with post-op recover - NO history of  - Chronic pain requiring opiods  - Diabetes  - Coronary Artery Disease  - Heart failure  - Heart attack  - Stroke  - DVT/VTE  - Cardiac arrhythmia  - Respiratory  Failure/COPD  - Renal failure  - Anemia  - Advanced Liver disease   

## 2023-04-08 ENCOUNTER — Other Ambulatory Visit: Payer: Self-pay

## 2023-04-08 ENCOUNTER — Ambulatory Visit (HOSPITAL_BASED_OUTPATIENT_CLINIC_OR_DEPARTMENT_OTHER): Payer: Medicare Other | Admitting: Anesthesiology

## 2023-04-08 ENCOUNTER — Encounter (HOSPITAL_COMMUNITY)
Admission: RE | Disposition: A | Discharge status: Home / Self Care | Payer: Self-pay | Source: Ambulatory Visit | Attending: Orthopedic Surgery

## 2023-04-08 ENCOUNTER — Ambulatory Visit (HOSPITAL_COMMUNITY): Payer: Medicare Other

## 2023-04-08 ENCOUNTER — Ambulatory Visit (HOSPITAL_COMMUNITY)
Admission: RE | Admit: 2023-04-08 | Discharge: 2023-04-09 | Disposition: A | Discharge status: Home / Self Care | Payer: Medicare Other | Source: Ambulatory Visit | Attending: Orthopedic Surgery | Admitting: Orthopedic Surgery

## 2023-04-08 ENCOUNTER — Ambulatory Visit (HOSPITAL_COMMUNITY): Payer: Medicare Other | Admitting: Physician Assistant

## 2023-04-08 ENCOUNTER — Encounter (HOSPITAL_COMMUNITY): Payer: Self-pay | Admitting: Orthopedic Surgery

## 2023-04-08 DIAGNOSIS — K219 Gastro-esophageal reflux disease without esophagitis: Secondary | ICD-10-CM | POA: Insufficient documentation

## 2023-04-08 DIAGNOSIS — Z96642 Presence of left artificial hip joint: Secondary | ICD-10-CM

## 2023-04-08 DIAGNOSIS — I350 Nonrheumatic aortic (valve) stenosis: Secondary | ICD-10-CM | POA: Insufficient documentation

## 2023-04-08 DIAGNOSIS — F419 Anxiety disorder, unspecified: Secondary | ICD-10-CM | POA: Diagnosis not present

## 2023-04-08 DIAGNOSIS — F32A Depression, unspecified: Secondary | ICD-10-CM | POA: Diagnosis not present

## 2023-04-08 DIAGNOSIS — D649 Anemia, unspecified: Secondary | ICD-10-CM | POA: Insufficient documentation

## 2023-04-08 DIAGNOSIS — D759 Disease of blood and blood-forming organs, unspecified: Secondary | ICD-10-CM | POA: Insufficient documentation

## 2023-04-08 DIAGNOSIS — M25752 Osteophyte, left hip: Secondary | ICD-10-CM | POA: Insufficient documentation

## 2023-04-08 DIAGNOSIS — E039 Hypothyroidism, unspecified: Secondary | ICD-10-CM | POA: Diagnosis not present

## 2023-04-08 DIAGNOSIS — D638 Anemia in other chronic diseases classified elsewhere: Secondary | ICD-10-CM

## 2023-04-08 DIAGNOSIS — I4891 Unspecified atrial fibrillation: Secondary | ICD-10-CM | POA: Insufficient documentation

## 2023-04-08 DIAGNOSIS — Z981 Arthrodesis status: Secondary | ICD-10-CM | POA: Insufficient documentation

## 2023-04-08 DIAGNOSIS — K449 Diaphragmatic hernia without obstruction or gangrene: Secondary | ICD-10-CM | POA: Diagnosis not present

## 2023-04-08 DIAGNOSIS — Z7901 Long term (current) use of anticoagulants: Secondary | ICD-10-CM | POA: Insufficient documentation

## 2023-04-08 DIAGNOSIS — I10 Essential (primary) hypertension: Secondary | ICD-10-CM

## 2023-04-08 DIAGNOSIS — M1612 Unilateral primary osteoarthritis, left hip: Secondary | ICD-10-CM | POA: Diagnosis not present

## 2023-04-08 DIAGNOSIS — Z471 Aftercare following joint replacement surgery: Secondary | ICD-10-CM | POA: Diagnosis not present

## 2023-04-08 HISTORY — PX: TOTAL HIP ARTHROPLASTY: SHX124

## 2023-04-08 SURGERY — ARTHROPLASTY, HIP, TOTAL, ANTERIOR APPROACH
Anesthesia: General | Site: Hip | Laterality: Left

## 2023-04-08 MED ORDER — FENTANYL CITRATE PF 50 MCG/ML IJ SOSY
25.0000 ug | PREFILLED_SYRINGE | INTRAMUSCULAR | Status: DC | PRN
  Administered 2023-04-08: 50 ug via INTRAVENOUS

## 2023-04-08 MED ORDER — EPINEPHRINE PF 1 MG/ML IJ SOLN
INTRAMUSCULAR | Status: AC
  Filled 2023-04-08: qty 1

## 2023-04-08 MED ORDER — WATER FOR IRRIGATION, STERILE IR SOLN
Status: DC | PRN
  Administered 2023-04-08: 2000 mL

## 2023-04-08 MED ORDER — METHOCARBAMOL 500 MG IVPB - SIMPLE MED: 500.0000 mg | Freq: Four times a day (QID) | INTRAVENOUS | Status: DC | PRN

## 2023-04-08 MED ORDER — ACETAMINOPHEN 160 MG/5ML PO SOLN: 325.0000 mg | ORAL | Status: DC | PRN

## 2023-04-08 MED ORDER — DOCUSATE SODIUM 100 MG PO CAPS
100.0000 mg | ORAL_CAPSULE | Freq: Two times a day (BID) | ORAL | Status: DC
  Administered 2023-04-08 – 2023-04-09 (×2): 100 mg via ORAL
  Filled 2023-04-08 (×2): qty 1

## 2023-04-08 MED ORDER — ONDANSETRON HCL 4 MG/2ML IJ SOLN
INTRAMUSCULAR | Status: DC | PRN
  Administered 2023-04-08: 4 mg via INTRAVENOUS

## 2023-04-08 MED ORDER — PROPOFOL 10 MG/ML IV BOLUS
INTRAVENOUS | Status: DC | PRN
  Administered 2023-04-08: 100 mg via INTRAVENOUS

## 2023-04-08 MED ORDER — OXYCODONE HCL 5 MG PO TABS: 5.0000 mg | ORAL_TABLET | Freq: Once | ORAL | Status: DC | PRN

## 2023-04-08 MED ORDER — GABAPENTIN 300 MG PO CAPS
600.0000 mg | ORAL_CAPSULE | Freq: Three times a day (TID) | ORAL | Status: DC
  Administered 2023-04-08 – 2023-04-09 (×3): 600 mg via ORAL
  Filled 2023-04-08 (×3): qty 2

## 2023-04-08 MED ORDER — DULOXETINE HCL 60 MG PO CPEP: 60.0000 mg | ORAL_CAPSULE | ORAL | Status: DC

## 2023-04-08 MED ORDER — HYDROMORPHONE HCL 1 MG/ML IJ SOLN
INTRAMUSCULAR | Status: DC | PRN
  Administered 2023-04-08 (×2): .5 mg via INTRAVENOUS

## 2023-04-08 MED ORDER — HYDROMORPHONE HCL 1 MG/ML IJ SOLN: 0.5000 mg | INTRAMUSCULAR | Status: DC | PRN

## 2023-04-08 MED ORDER — ONDANSETRON HCL 4 MG/2ML IJ SOLN: 4.0000 mg | Freq: Once | INTRAMUSCULAR | Status: DC | PRN

## 2023-04-08 MED ORDER — PHENYLEPHRINE HCL-NACL 20-0.9 MG/250ML-% IV SOLN
INTRAVENOUS | Status: DC | PRN
  Administered 2023-04-08: 40 ug/min via INTRAVENOUS

## 2023-04-08 MED ORDER — DEXAMETHASONE SODIUM PHOSPHATE 10 MG/ML IJ SOLN
INTRAMUSCULAR | Status: AC
  Filled 2023-04-08: qty 1

## 2023-04-08 MED ORDER — GLYCOPYRROLATE 0.2 MG/ML IJ SOLN
INTRAMUSCULAR | Status: AC
  Filled 2023-04-08: qty 1

## 2023-04-08 MED ORDER — CEFAZOLIN SODIUM-DEXTROSE 2-4 GM/100ML-% IV SOLN
2.0000 g | INTRAVENOUS | Status: AC
  Administered 2023-04-08: 2 g via INTRAVENOUS
  Filled 2023-04-08: qty 100

## 2023-04-08 MED ORDER — ONDANSETRON HCL 4 MG/2ML IJ SOLN: 4.0000 mg | Freq: Four times a day (QID) | INTRAMUSCULAR | Status: DC | PRN

## 2023-04-08 MED ORDER — MIDAZOLAM HCL 2 MG/2ML IJ SOLN
INTRAMUSCULAR | Status: AC
  Filled 2023-04-08: qty 2

## 2023-04-08 MED ORDER — ACETAMINOPHEN 325 MG PO TABS: 325.0000 mg | ORAL_TABLET | ORAL | Status: DC | PRN

## 2023-04-08 MED ORDER — OXYCODONE HCL 5 MG PO TABS
5.0000 mg | ORAL_TABLET | ORAL | Status: DC | PRN
  Administered 2023-04-08 – 2023-04-09 (×4): 10 mg via ORAL
  Filled 2023-04-08 (×5): qty 2

## 2023-04-08 MED ORDER — BUPIVACAINE HCL (PF) 0.25 % IJ SOLN
INTRAMUSCULAR | Status: AC
  Filled 2023-04-08: qty 30

## 2023-04-08 MED ORDER — ACETAMINOPHEN 500 MG PO TABS
1000.0000 mg | ORAL_TABLET | Freq: Once | ORAL | Status: AC
  Administered 2023-04-08: 1000 mg via ORAL
  Filled 2023-04-08: qty 2

## 2023-04-08 MED ORDER — PROPOFOL 10 MG/ML IV BOLUS
INTRAVENOUS | Status: AC
  Filled 2023-04-08: qty 20

## 2023-04-08 MED ORDER — FENTANYL CITRATE PF 50 MCG/ML IJ SOSY
PREFILLED_SYRINGE | INTRAMUSCULAR | Status: AC
  Filled 2023-04-08: qty 2

## 2023-04-08 MED ORDER — DIPHENHYDRAMINE HCL 12.5 MG/5ML PO ELIX: 12.5000 mg | ORAL_SOLUTION | ORAL | Status: DC | PRN

## 2023-04-08 MED ORDER — FENTANYL CITRATE (PF) 100 MCG/2ML IJ SOLN
INTRAMUSCULAR | Status: DC | PRN
  Administered 2023-04-08 (×2): 50 ug via INTRAVENOUS

## 2023-04-08 MED ORDER — OXYCODONE HCL 5 MG PO TABS
10.0000 mg | ORAL_TABLET | ORAL | Status: DC | PRN
  Administered 2023-04-09: 10 mg via ORAL

## 2023-04-08 MED ORDER — GLYCOPYRROLATE 0.2 MG/ML IJ SOLN
INTRAMUSCULAR | Status: DC | PRN
  Administered 2023-04-08: .2 mg via INTRAVENOUS

## 2023-04-08 MED ORDER — LIDOCAINE HCL (PF) 2 % IJ SOLN
INTRAMUSCULAR | Status: AC
  Filled 2023-04-08: qty 5

## 2023-04-08 MED ORDER — DULOXETINE HCL 30 MG PO CPEP
90.0000 mg | ORAL_CAPSULE | Freq: Every day | ORAL | Status: DC
  Administered 2023-04-09: 90 mg via ORAL
  Filled 2023-04-08: qty 3

## 2023-04-08 MED ORDER — OXYCODONE HCL 5 MG/5ML PO SOLN: 5.0000 mg | Freq: Once | ORAL | Status: DC | PRN

## 2023-04-08 MED ORDER — METOPROLOL SUCCINATE ER 100 MG PO TB24
100.0000 mg | ORAL_TABLET | Freq: Every day | ORAL | Status: DC
  Administered 2023-04-09: 100 mg via ORAL
  Filled 2023-04-08: qty 1

## 2023-04-08 MED ORDER — METOCLOPRAMIDE HCL 5 MG/ML IJ SOLN: 5.0000 mg | Freq: Three times a day (TID) | INTRAMUSCULAR | Status: DC | PRN

## 2023-04-08 MED ORDER — LACTATED RINGERS IV SOLN: INTRAVENOUS | Status: DC

## 2023-04-08 MED ORDER — ALUM & MAG HYDROXIDE-SIMETH 200-200-20 MG/5ML PO SUSP: 30.0000 mL | ORAL | Status: DC | PRN

## 2023-04-08 MED ORDER — DEXAMETHASONE SODIUM PHOSPHATE 10 MG/ML IJ SOLN
INTRAMUSCULAR | Status: DC | PRN
  Administered 2023-04-08: 4 mg via INTRAVENOUS

## 2023-04-08 MED ORDER — POVIDONE-IODINE 10 % EX SWAB: 2.0000 | Freq: Once | CUTANEOUS | Status: DC

## 2023-04-08 MED ORDER — ONDANSETRON HCL 4 MG/2ML IJ SOLN
INTRAMUSCULAR | Status: AC
  Filled 2023-04-08: qty 2

## 2023-04-08 MED ORDER — ROCURONIUM BROMIDE 10 MG/ML (PF) SYRINGE
PREFILLED_SYRINGE | INTRAVENOUS | Status: AC
  Filled 2023-04-08: qty 10

## 2023-04-08 MED ORDER — KETOROLAC TROMETHAMINE 30 MG/ML IJ SOLN
INTRAMUSCULAR | Status: DC | PRN
  Administered 2023-04-08: 30 mg

## 2023-04-08 MED ORDER — CHLORHEXIDINE GLUCONATE 0.12 % MT SOLN
15.0000 mL | Freq: Once | OROMUCOSAL | Status: AC
  Administered 2023-04-08: 15 mL via OROMUCOSAL

## 2023-04-08 MED ORDER — ROCURONIUM BROMIDE 100 MG/10ML IV SOLN
INTRAVENOUS | Status: DC | PRN
  Administered 2023-04-08: 70 mg via INTRAVENOUS

## 2023-04-08 MED ORDER — ONDANSETRON HCL 4 MG PO TABS: 4.0000 mg | ORAL_TABLET | Freq: Four times a day (QID) | ORAL | Status: DC | PRN

## 2023-04-08 MED ORDER — PROPOFOL 1000 MG/100ML IV EMUL
INTRAVENOUS | Status: AC
  Filled 2023-04-08: qty 100

## 2023-04-08 MED ORDER — PHENYLEPHRINE HCL-NACL 20-0.9 MG/250ML-% IV SOLN
INTRAVENOUS | Status: AC
  Filled 2023-04-08: qty 250

## 2023-04-08 MED ORDER — TRANEXAMIC ACID-NACL 1000-0.7 MG/100ML-% IV SOLN
1000.0000 mg | INTRAVENOUS | Status: AC
  Administered 2023-04-08: 1000 mg via INTRAVENOUS
  Filled 2023-04-08: qty 100

## 2023-04-08 MED ORDER — MEPERIDINE HCL 50 MG/ML IJ SOLN: 6.2500 mg | INTRAMUSCULAR | Status: DC | PRN

## 2023-04-08 MED ORDER — SUGAMMADEX SODIUM 200 MG/2ML IV SOLN
INTRAVENOUS | Status: DC | PRN
  Administered 2023-04-08: 200 mg via INTRAVENOUS

## 2023-04-08 MED ORDER — LIDOCAINE HCL (CARDIAC) PF 100 MG/5ML IV SOSY
PREFILLED_SYRINGE | INTRAVENOUS | Status: DC | PRN
  Administered 2023-04-08: 60 mg via INTRAVENOUS

## 2023-04-08 MED ORDER — ACETAMINOPHEN 325 MG PO TABS: 325.0000 mg | ORAL_TABLET | Freq: Four times a day (QID) | ORAL | Status: DC | PRN

## 2023-04-08 MED ORDER — CEFAZOLIN SODIUM-DEXTROSE 2-4 GM/100ML-% IV SOLN
2.0000 g | Freq: Four times a day (QID) | INTRAVENOUS | Status: AC
  Administered 2023-04-08 (×2): 2 g via INTRAVENOUS
  Filled 2023-04-08 (×2): qty 100

## 2023-04-08 MED ORDER — MIDAZOLAM HCL 5 MG/5ML IJ SOLN
INTRAMUSCULAR | Status: DC | PRN
  Administered 2023-04-08: 2 mg via INTRAVENOUS

## 2023-04-08 MED ORDER — ISOPROPYL ALCOHOL 70 % SOLN
Status: DC | PRN
  Administered 2023-04-08: 1 via TOPICAL

## 2023-04-08 MED ORDER — ACETAMINOPHEN 500 MG PO TABS
1000.0000 mg | ORAL_TABLET | Freq: Four times a day (QID) | ORAL | Status: AC
  Administered 2023-04-08 – 2023-04-09 (×3): 1000 mg via ORAL
  Filled 2023-04-08 (×3): qty 2

## 2023-04-08 MED ORDER — COLCHICINE 0.6 MG PO TABS: 0.6000 mg | ORAL_TABLET | Freq: Every day | ORAL | Status: DC | PRN

## 2023-04-08 MED ORDER — BUPIVACAINE-EPINEPHRINE 0.25% -1:200000 IJ SOLN
INTRAMUSCULAR | Status: DC | PRN
  Administered 2023-04-08: 30 mL

## 2023-04-08 MED ORDER — LEVOTHYROXINE SODIUM 100 MCG PO TABS
200.0000 ug | ORAL_TABLET | Freq: Every day | ORAL | Status: DC
  Administered 2023-04-09: 200 ug via ORAL
  Filled 2023-04-08: qty 2
  Filled 2023-04-08: qty 1

## 2023-04-08 MED ORDER — ORAL CARE MOUTH RINSE: 15.0000 mL | Freq: Once | OROMUCOSAL | Status: AC

## 2023-04-08 MED ORDER — SODIUM CHLORIDE 0.9 % IR SOLN
Status: DC | PRN
  Administered 2023-04-08: 4000 mL

## 2023-04-08 MED ORDER — MENTHOL 3 MG MT LOZG: 1.0000 | LOZENGE | OROMUCOSAL | Status: DC | PRN

## 2023-04-08 MED ORDER — SODIUM CHLORIDE 0.9 % IV SOLN: INTRAVENOUS | Status: DC

## 2023-04-08 MED ORDER — EPHEDRINE 5 MG/ML INJ
INTRAVENOUS | Status: AC
  Filled 2023-04-08: qty 5

## 2023-04-08 MED ORDER — METHOCARBAMOL 500 MG PO TABS
500.0000 mg | ORAL_TABLET | Freq: Four times a day (QID) | ORAL | Status: DC | PRN
  Administered 2023-04-09 (×2): 500 mg via ORAL
  Filled 2023-04-08 (×2): qty 1

## 2023-04-08 MED ORDER — FENTANYL CITRATE (PF) 100 MCG/2ML IJ SOLN
INTRAMUSCULAR | Status: AC
  Filled 2023-04-08: qty 2

## 2023-04-08 MED ORDER — PHENOL 1.4 % MT LIQD: 1.0000 | OROMUCOSAL | Status: DC | PRN

## 2023-04-08 MED ORDER — PANTOPRAZOLE SODIUM 40 MG PO TBEC
40.0000 mg | DELAYED_RELEASE_TABLET | Freq: Every day | ORAL | Status: DC
  Administered 2023-04-08 – 2023-04-09 (×2): 40 mg via ORAL
  Filled 2023-04-08 (×2): qty 1

## 2023-04-08 MED ORDER — POLYETHYLENE GLYCOL 3350 17 G PO PACK: 17.0000 g | PACK | Freq: Every day | ORAL | Status: DC | PRN

## 2023-04-08 MED ORDER — HYDROMORPHONE HCL 2 MG/ML IJ SOLN
INTRAMUSCULAR | Status: AC
  Filled 2023-04-08: qty 1

## 2023-04-08 MED ORDER — SODIUM CHLORIDE (PF) 0.9 % IJ SOLN
INTRAMUSCULAR | Status: DC | PRN
  Administered 2023-04-08: 30 mL

## 2023-04-08 MED ORDER — SENNA 8.6 MG PO TABS
1.0000 | ORAL_TABLET | Freq: Two times a day (BID) | ORAL | Status: DC
  Administered 2023-04-08 – 2023-04-09 (×2): 8.6 mg via ORAL
  Filled 2023-04-08 (×2): qty 1

## 2023-04-08 MED ORDER — APIXABAN 2.5 MG PO TABS
2.5000 mg | ORAL_TABLET | Freq: Two times a day (BID) | ORAL | Status: DC
  Administered 2023-04-09: 2.5 mg via ORAL
  Filled 2023-04-08: qty 1

## 2023-04-08 MED ORDER — METOCLOPRAMIDE HCL 5 MG PO TABS: 5.0000 mg | ORAL_TABLET | Freq: Three times a day (TID) | ORAL | Status: DC | PRN

## 2023-04-08 MED ORDER — FUROSEMIDE 20 MG PO TABS: 20.0000 mg | ORAL_TABLET | Freq: Every day | ORAL | Status: DC | PRN

## 2023-04-08 MED ORDER — EPHEDRINE SULFATE (PRESSORS) 50 MG/ML IJ SOLN
INTRAMUSCULAR | Status: DC | PRN
  Administered 2023-04-08: 10 mg via INTRAVENOUS

## 2023-04-08 MED ORDER — ROPIVACAINE HCL 5 MG/ML IJ SOLN: INTRAMUSCULAR | Status: DC | PRN

## 2023-04-08 SURGICAL SUPPLY — 57 items
ADH SKN CLS APL DERMABOND .7 (GAUZE/BANDAGES/DRESSINGS) ×1
APL PRP STRL LF DISP 70% ISPRP (MISCELLANEOUS) ×1
BAG COUNTER SPONGE SURGICOUNT (BAG) IMPLANT
BAG DECANTER FOR FLEXI CONT (MISCELLANEOUS) IMPLANT
BAG SPEC THK2 15X12 ZIP CLS (MISCELLANEOUS)
BAG SPNG CNTER NS LX DISP (BAG)
BAG ZIPLOCK 12X15 (MISCELLANEOUS) IMPLANT
CHLORAPREP W/TINT 26 (MISCELLANEOUS) ×2 IMPLANT
COVER PERINEAL POST (MISCELLANEOUS) ×2 IMPLANT
COVER SURGICAL LIGHT HANDLE (MISCELLANEOUS) ×2 IMPLANT
DERMABOND ADVANCED .7 DNX12 (GAUZE/BANDAGES/DRESSINGS) ×4 IMPLANT
DRAPE IMP U-DRAPE 54X76 (DRAPES) ×2 IMPLANT
DRAPE SHEET LG 3/4 BI-LAMINATE (DRAPES) ×6 IMPLANT
DRAPE STERI IOBAN 125X83 (DRAPES) ×2 IMPLANT
DRAPE U-SHAPE 47X51 STRL (DRAPES) ×2 IMPLANT
DRESSING AQUACEL AG SP 3.5X10 (GAUZE/BANDAGES/DRESSINGS) IMPLANT
DRSG AQUACEL AG ADV 3.5X10 (GAUZE/BANDAGES/DRESSINGS) ×2 IMPLANT
DRSG AQUACEL AG SP 3.5X10 (GAUZE/BANDAGES/DRESSINGS) ×1
ELECT REM PT RETURN 15FT ADLT (MISCELLANEOUS) ×2 IMPLANT
GAUZE SPONGE 4X4 12PLY STRL (GAUZE/BANDAGES/DRESSINGS) ×2 IMPLANT
GLOVE BIO SURGEON STRL SZ7 (GLOVE) ×2 IMPLANT
GLOVE BIO SURGEON STRL SZ8.5 (GLOVE) ×4 IMPLANT
GLOVE BIOGEL PI IND STRL 7.5 (GLOVE) ×2 IMPLANT
GLOVE BIOGEL PI IND STRL 8.5 (GLOVE) ×2 IMPLANT
GOWN SPEC L3 XXLG W/TWL (GOWN DISPOSABLE) ×2 IMPLANT
GOWN STRL REUS W/ TWL XL LVL3 (GOWN DISPOSABLE) ×2 IMPLANT
GOWN STRL REUS W/TWL XL LVL3 (GOWN DISPOSABLE) ×1
HANDPIECE INTERPULSE COAX TIP (DISPOSABLE) ×1
HEAD CERAMIC BIOLOX 36 T1 STD (Head) IMPLANT
HOLDER FOLEY CATH W/STRAP (MISCELLANEOUS) ×2 IMPLANT
HOOD PEEL AWAY T7 (MISCELLANEOUS) ×6 IMPLANT
KIT TURNOVER KIT A (KITS) IMPLANT
LINER ACETAB ~~LOC~~ D 36 (Liner) IMPLANT
MANIFOLD NEPTUNE II (INSTRUMENTS) ×2 IMPLANT
MARKER SKIN DUAL TIP RULER LAB (MISCELLANEOUS) ×2 IMPLANT
NDL SAFETY ECLIP 18X1.5 (MISCELLANEOUS) ×2 IMPLANT
NDL SPNL 18GX3.5 QUINCKE PK (NEEDLE) ×2 IMPLANT
NEEDLE SPNL 18GX3.5 QUINCKE PK (NEEDLE) ×1 IMPLANT
PACK ANTERIOR HIP CUSTOM (KITS) ×2 IMPLANT
SAW OSC TIP CART 19.5X105X1.3 (SAW) ×2 IMPLANT
SEALER BIPOLAR AQUA 6.0 (INSTRUMENTS) ×2 IMPLANT
SET HNDPC FAN SPRY TIP SCT (DISPOSABLE) ×2 IMPLANT
SHELL ACET G7 3H 50 SZD (Shell) IMPLANT
SOLUTION PRONTOSAN WOUND 350ML (IRRIGATION / IRRIGATOR) ×2 IMPLANT
SPIKE FLUID TRANSFER (MISCELLANEOUS) ×2 IMPLANT
STEM FEM REDUCE DISTAL 11X107 (Stem) IMPLANT
SUT MNCRL AB 3-0 PS2 18 (SUTURE) ×2 IMPLANT
SUT MON AB 2-0 CT1 36 (SUTURE) ×2 IMPLANT
SUT STRATAFIX PDO 1 14 VIOLET (SUTURE) ×1
SUT STRATFX PDO 1 14 VIOLET (SUTURE) ×1
SUT VIC AB 2-0 CT1 27 (SUTURE)
SUT VIC AB 2-0 CT1 TAPERPNT 27 (SUTURE) IMPLANT
SUTURE STRATFX PDO 1 14 VIOLET (SUTURE) ×2 IMPLANT
SYR 3ML LL SCALE MARK (SYRINGE) ×2 IMPLANT
TRAY FOLEY MTR SLVR 16FR STAT (SET/KITS/TRAYS/PACK) IMPLANT
TUBE SUCTION HIGH CAP CLEAR NV (SUCTIONS) ×2 IMPLANT
WATER STERILE IRR 1000ML POUR (IV SOLUTION) ×2 IMPLANT

## 2023-04-08 NOTE — Anesthesia Procedure Notes (Deleted)
Anesthesia Regional Block: Femoral nerve block   Pre-Anesthetic Checklist: , timeout performed,  Correct Patient, Correct Site, Correct Laterality,  Correct Procedure, Correct Position, site marked,  Risks and benefits discussed,  Surgical consent,  Pre-op evaluation,  At surgeon's request and post-op pain management  Laterality: Left  Prep: chloraprep       Needles:  Injection technique: Single-shot  Needle Type: Echogenic Stimulator Needle     Needle Length: 5cm  Needle Gauge: 22     Additional Needles:   Procedures:,,,, ultrasound used (permanent image in chart),,    Narrative:  Start time: 04/08/2023 9:45 AM End time: 04/08/2023 9:50 AM Injection made incrementally with aspirations every 5 mL.  Performed by: Personally  Anesthesiologist: Bethena Midget, MD  Additional Notes: Functioning IV was confirmed and monitors were applied.  A 50mm 22ga Arrow echogenic stimulator needle was used. Sterile prep and drape,hand hygiene and sterile gloves were used. Ultrasound guidance: relevant anatomy identified, needle position confirmed, local anesthetic spread visualized around nerve(s)., vascular puncture avoided.  Image printed for medical record. Negative aspiration and negative test dose prior to incremental administration of local anesthetic. The patient tolerated the procedure well.

## 2023-04-08 NOTE — Interval H&P Note (Signed)
History and Physical Interval Note:  04/08/2023 9:36 AM  Kathryn [name]  has presented today for surgery, with the diagnosis of Left hip osteoarthritis.  The various methods of treatment have been discussed with the patient and family. After consideration of risks, benefits and other options for treatment, the patient has consented to  Procedure(s) with comments: TOTAL HIP ARTHROPLASTY ANTERIOR APPROACH (Left) - 150 as a surgical intervention.  The patient's history has been reviewed, patient examined, no change in status, stable for surgery.  I have reviewed the patient's chart and labs.  Questions were answered to the patient's satisfaction.     [name]

## 2023-04-08 NOTE — Anesthesia Procedure Notes (Signed)
Procedure Name: Intubation Date/Time: 04/08/2023 10:32 AM  Performed by: Johnette Abraham, CRNAPre-anesthesia Checklist: Patient identified, Emergency Drugs available, Suction available and Patient being monitored Patient Re-evaluated:Patient Re-evaluated prior to induction Oxygen Delivery Method: Circle System Utilized Preoxygenation: Pre-oxygenation with 100% oxygen Induction Type: IV induction Ventilation: Mask ventilation without difficulty Laryngoscope Size: Mac and 3 Grade View: Grade II Tube type: Oral Tube size: 7.0 mm Number of attempts: 1 Airway Equipment and Method: Stylet and Oral airway Placement Confirmation: ETT inserted through vocal cords under direct vision, positive ETCO2 and breath sounds checked- equal and bilateral Secured at: 22 cm Tube secured with: Tape Dental Injury: Teeth and Oropharynx as per pre-operative assessment

## 2023-04-08 NOTE — Anesthesia Postprocedure Evaluation (Signed)
Anesthesia Post Note  Patient: Kathryn [name]  Procedure(s) Performed: TOTAL HIP ARTHROPLASTY ANTERIOR APPROACH (Left: Hip)     Patient location during evaluation: PACU Anesthesia Type: General Level of consciousness: awake and alert Pain management: pain level controlled Vital Signs Assessment: post-procedure vital signs reviewed and stable Respiratory status: spontaneous breathing, nonlabored ventilation, respiratory function stable and patient connected to nasal cannula oxygen Cardiovascular status: blood pressure returned to baseline and stable Postop Assessment: no apparent nausea or vomiting Anesthetic complications: no   No notable events documented.  Last Vitals:  Vitals:   04/08/23 1240 04/08/23 1245  BP: 131/70 (!) 118/58  Pulse: (!) 57 (!) 56  Resp: 14 10  Temp: 36.8 C   SpO2: 96% 100%    Last Pain:  Vitals:   04/08/23 1240  TempSrc:   PainSc: Asleep                 Jovanne Riggenbach

## 2023-04-08 NOTE — Evaluation (Signed)
Physical Therapy Evaluation Patient Details Name: Kathryn [name] MRN: [id number] DOB: 1956-06-01 Today's Date: 04/08/2023  History of Present Illness  Pt is 67 yo female admitted 04/08/23 for L anterior THA.  Pt with hx including but not limited to Bil TKA, C3-C26fusion, L 5 laminectomy, bil carpal tunnel release, breast CA, lumpectomy, HTN, tremor, anxiety  Clinical Impression  Pt is s/p THA resulting in the deficits listed below (see PT Problem List). At baseline, pt is independent with a cane.  She will have family support at d/c and only has 3 steps to enter her home with rail.  Pt seen on DOS for evaluation.  Pt had general anesthesia and was very lethargic but agreeable to therapy.  She had good pain control and was able to ambulate 20' with RW and min guard for safety - distance limited due to lethargy.  Pt expected to progress well with therapy.  Pt will benefit from acute skilled PT to increase their independence and safety with mobility to facilitate discharge.         Recommendations for follow up therapy are one component of a multi-disciplinary discharge planning process, led by the attending physician.  Recommendations may be updated based on patient status, additional functional criteria and insurance authorization.         Assistance Recommended at Discharge Intermittent Supervision/Assistance  Patient can return home with the following  A little help with walking and/or transfers;A little help with bathing/dressing/bathroom;Assistance with cooking/housework;Help with stairs or ramp for entrance    Equipment Recommendations None recommended by PT  Recommendations for Other Services       Functional Status Assessment Patient has had a recent decline in their functional status and demonstrates the ability to make significant improvements in function in a reasonable and predictable amount of time.     Precautions / Restrictions Precautions Precautions:  Fall Restrictions Weight Bearing Restrictions: Yes LLE Weight Bearing: Weight bearing as tolerated      Mobility  Bed Mobility Overal bed mobility: Needs Assistance Bed Mobility: Supine to Sit     Supine to sit: Min assist     General bed mobility comments: min A for L LE, increased time, cues    Transfers Overall transfer level: Needs assistance Equipment used: Rolling walker (2 wheels) Transfers: Sit to/from Stand Sit to Stand: Min assist           General transfer comment: cues for hand placement min A to steady    Ambulation/Gait Ambulation/Gait assistance: Min guard Gait Distance (Feet): 20 Feet Assistive device: Rolling walker (2 wheels) Gait Pattern/deviations: Step-to pattern, Decreased stride length Gait velocity: decreased     General Gait Details: Initial plans just for side steps due to lethargy but pt wanting to walk. Ambulated to door and back with min guard and cues for RW.  Rehab tech was present for +2 if needed since so lethargic  Stairs            Wheelchair Mobility    Modified Rankin (Stroke Patients Only)       Balance Overall balance assessment: Needs assistance Sitting-balance support: No upper extremity supported Sitting balance-Leahy Scale: Good     Standing balance support: Bilateral upper extremity supported, Reliant on assistive device for balance Standing balance-Leahy Scale: Poor Standing balance comment: Steady with RW                             Pertinent Vitals/Pain Pain Assessment  Pain Assessment: Faces Faces Pain Scale: Hurts a little bit Pain Location: L hip with movement Pain Descriptors / Indicators: Discomfort Pain Intervention(s): Limited activity within patient's tolerance, Monitored during session, Premedicated before session, Repositioned, Ice applied    Home Living Family/patient expects to be discharged to:: Private residence Living Arrangements: Spouse/significant other Available  Help at Discharge: Family;Available 24 hours/day Type of Home: House Home Access: Stairs to enter Entrance Stairs-Rails: Right;Left;Can reach both Entrance Stairs-Number of Steps: 3   Home Layout: Two level;Able to live on main level with bedroom/bathroom Home Equipment: Rolling Walker (2 wheels);Shower seat - built in;Grab bars - tub/shower;Hand held shower head;Adaptive equipment      Prior Function Prior Level of Function : Independent/Modified Independent;Driving             Mobility Comments: Could ambulate in community; used cane ADLs Comments: independent adls and iadls     Hand Dominance        Extremity/Trunk Assessment   Upper Extremity Assessment Upper Extremity Assessment: Overall WFL for tasks assessed    Lower Extremity Assessment Lower Extremity Assessment: LLE deficits/detail;RLE deficits/detail RLE Deficits / Details: ROM WFL; MMT 5/5 LLE Deficits / Details: ROM WFL; MMT: ankle 5/5, knee 3/5, hip 2/5    Cervical / Trunk Assessment Cervical / Trunk Assessment: Kyphotic  Communication   Communication: No difficulties  Cognition Arousal/Alertness: Lethargic, Suspect due to medications Behavior During Therapy: WFL for tasks assessed/performed Overall Cognitive Status: Within Functional Limits for tasks assessed                                 General Comments: Pt very lethargic - had general anesthesia        General Comments General comments (skin integrity, edema, etc.): Pt on 2 L O2 with sats 98%.  Did RA during session and while interactive sats were 93%.  Did reapply O2 and continuous pulse ox at end of session due to drop in sats and lethergy    Exercises     Assessment/Plan    PT Assessment Patient needs continued PT services  PT Problem List Decreased strength;Pain;Decreased range of motion;Decreased activity tolerance;Decreased balance;Decreased mobility;Decreased knowledge of use of DME       PT Treatment  Interventions DME instruction;Therapeutic exercise;Gait training;Balance training;Stair training;Modalities;Functional mobility training;Therapeutic activities;Patient/family education    PT Goals (Current goals can be found in the Care Plan section)  Acute Rehab PT Goals Patient Stated Goal: return home PT Goal Formulation: With patient/family Time For Goal Achievement: 04/22/23 Potential to Achieve Goals: Good    Frequency 7X/week     Co-evaluation               AM-PAC PT "6 Clicks" Mobility  Outcome Measure Help needed turning from your back to your side while in a flat bed without using bedrails?: A Little Help needed moving from lying on your back to sitting on the side of a flat bed without using bedrails?: A Little Help needed moving to and from a bed to a chair (including a wheelchair)?: A Little Help needed standing up from a chair using your arms (e.g., wheelchair or bedside chair)?: A Little Help needed to walk in hospital room?: A Little Help needed climbing 3-5 steps with a railing? : A Little 6 Click Score: 18    End of Session Equipment Utilized During Treatment: Gait belt Activity Tolerance: Patient limited by lethargy Patient left: with chair alarm set;in  chair;with call bell/phone within reach;with family/visitor present Nurse Communication: Mobility status PT Visit Diagnosis: Other abnormalities of gait and mobility (R26.89);Muscle weakness (generalized) (M62.81)    Time: 4098-1191 PT Time Calculation (min) (ACUTE ONLY): 27 min   Charges:   PT Evaluation $PT Eval Low Complexity: 1 Low PT Treatments $Gait Training: 8-22 mins        Anise Salvo, PT Acute Rehab [facility] [phone]   Rayetta Humphrey 04/08/2023, 4:42 PM

## 2023-04-08 NOTE — Transfer of Care (Signed)
Immediate Anesthesia Transfer of Care Note  Patient: Kathryn [name]  Procedure(s) Performed: TOTAL HIP ARTHROPLASTY ANTERIOR APPROACH (Left: Hip)  Patient Location: PACU  Anesthesia Type:General  Level of Consciousness: awake, alert , oriented, and patient cooperative  Airway & Oxygen Therapy: Patient Spontanous Breathing and Patient connected to face mask oxygen  Post-op Assessment: Report given to RN, Post -op Vital signs reviewed and stable, and Patient moving all extremities  Post vital signs: Reviewed and stable  Last Vitals:  Vitals Value Taken Time  BP 131/70 04/08/23 1240  Temp 36.8 C 04/08/23 1240  Pulse 56 04/08/23 1244  Resp 11 04/08/23 1244  SpO2 100 % 04/08/23 1244  Vitals shown include unvalidated device data.  Last Pain:  Vitals:   04/08/23 0828  TempSrc:   PainSc: 0-No pain         Complications: No notable events documented.

## 2023-04-08 NOTE — Op Note (Signed)
OPERATIVE REPORT  SURGEON: Samson Frederic, MD   ASSISTANT: Clint Bolder, PA-C.  PREOPERATIVE DIAGNOSIS: Left hip arthritis.   POSTOPERATIVE DIAGNOSIS: Left hip arthritis.   PROCEDURE: Left total hip arthroplasty, anterior approach.   IMPLANTS: Biomet Taperloc Complete Microplasty stem, size 11 x 107.5 mm, high offset. Biomet G7 OsseoTi Cup, size 50 mm. Biomet Vivacit-E liner, size 36 mm, D,  neutral. Biomet Biolox ceramic head ball, size 36 + 0 mm.  ANESTHESIA:  General  ESTIMATED BLOOD LOSS:-200 mL    ANTIBIOTICS: 2 g Ancef.  DRAINS: None.  COMPLICATIONS: None.   CONDITION: PACU - hemodynamically stable.   BRIEF CLINICAL NOTE: Kathryn [name] is a 67 y.o. female with a long-standing history of Left hip arthritis. After failing conservative management, the patient was indicated for total hip arthroplasty. The risks, benefits, and alternatives to the procedure were explained, and the patient elected to proceed.  PROCEDURE IN DETAIL: Surgical site was marked by myself in the pre-op holding area. Once inside the operating room, spinal anesthesia was obtained, and a foley catheter was inserted. The patient was then positioned on the Hana table.  All bony prominences were well padded.  The hip was prepped and draped in the normal sterile surgical fashion.  A time-out was called verifying side and site of surgery. The patient received IV antibiotics within 60 minutes of beginning the procedure.   Bikini incision was made, and superficial dissection was performed lateral to the ASIS. The direct anterior approach to the hip was performed through the Hueter interval.  Lateral femoral circumflex vessels were treated with the Auqumantys. The anterior capsule was exposed and an inverted T capsulotomy was made. The femoral neck cut was made to the level of the templated cut.  A corkscrew was placed into the head and the head was removed.  The femoral head was found to have eburnated bone.  The head was passed to the back table and was measured. Pubofemoral ligament was released off of the calcar, taking care to stay on bone. Superior capsule was released from the greater trochanter, taking care to stay lateral to the posterior border of the femoral neck in order to preserve the short external rotators.   Acetabular exposure was achieved, and the pulvinar and labrum were excised. Sequential reaming of the acetabulum was then performed up to a size 49 mm reamer. A 50 mm cup was then opened and impacted into place at approximately 40 degrees of abduction and 20 degrees of anteversion. The final polyethylene liner was impacted into place and acetabular osteophytes were removed.    I then gained femoral exposure taking care to protect the abductors and greater trochanter.  This was performed using standard external rotation, extension, and adduction.  A cookie cutter was used to enter the femoral canal, and then the femoral canal finder was placed.  Sequential broaching was performed up to a size 11.  Calcar planer was used on the femoral neck remnant.  I placed a high offset neck and a trial head ball.  The hip was reduced.  Leg lengths and offset were checked fluoroscopically.  The hip was dislocated and trial components were removed.  The final implants were placed, and the hip was reduced.  Fluoroscopy was used to confirm component position and leg lengths.  At 90 degrees of external rotation and full extension, the hip was stable to an anterior directed force.   The wound was copiously irrigated with Prontosan solution and normal saline using pule lavage.  Marcaine solution was injected into the periarticular soft tissue.  The wound was closed in layers using #1 Vicryl and V-Loc for the fascia, 2-0 Vicryl for the subcutaneous fat, 2-0 Monocryl for the deep dermal layer, 3-0 running Monocryl subcuticular stitch, and Dermabond for the skin.  Once the glue was fully dried, an Aquacell Ag dressing  was applied.  The patient was transported to the recovery room in stable condition.  Sponge, needle, and instrument counts were correct at the end of the case x2.  The patient tolerated the procedure well and there were no known complications.  The aquamantis was utilized for this case to help facilitate better hemostasis as patient was felt to be at increased risk of bleeding because of recent anticoagulation use.  A oscillating saw tip was utilized for this case to prevent damage to the soft tissue structures such as muscles, ligaments and tendons, and to ensure accurate bone cuts. This patient was at increased risk for above structures due to  minimally invasive approach.  Please note that a surgical assistant was a medical necessity for this procedure to perform it in a safe and expeditious manner. Assistant was necessary to provide appropriate retraction of vital neurovascular structures, to prevent femoral fracture, and to allow for anatomic placement of the prosthesis.

## 2023-04-08 NOTE — Discharge Instructions (Addendum)
? ?Dr. Brian Swinteck ?Joint Replacement Specialist ?[facility] ?[address] ?[place], [place] [address] ?[phone] ? ? ?TOTAL HIP REPLACEMENT POSTOPERATIVE DIRECTIONS ? ? ? ?Hip Rehabilitation, Guidelines Following Surgery  ? ?WEIGHT BEARING ?Weight bearing as tolerated with assist device (walker, cane, etc) as directed, use it as long as suggested by your surgeon or therapist, typically at least 4-6 weeks. ? ?The results of a hip operation are greatly improved after range of motion and muscle strengthening exercises. Follow all safety measures which are given to protect your hip. If any of these exercises cause increased pain or swelling in your joint, decrease the amount until you are comfortable again. Then slowly increase the exercises. Call your caregiver if you have problems or questions.  ? ?HOME CARE INSTRUCTIONS  ?Most of the following instructions are designed to prevent the dislocation of your new hip.  ?Remove items at home which could result in a fall. This includes throw rugs or furniture in walking pathways.  ?Continue medications as instructed at time of discharge. ?You may have some home medications which will be placed on hold until you complete the course of blood thinner medication. ?You may start showering once you are discharged home. Do not remove your dressing. ?Do not put on socks or shoes without following the instructions of your caregivers.   ?Sit on chairs with arms. Use the chair arms to help push yourself up when arising.  ?Arrange for the use of a toilet seat elevator so you are not sitting low.  ?Walk with walker as instructed.  ?You may resume a sexual relationship in one month or when given the OK by your caregiver.  ?Use walker as long as suggested by your caregivers.  ?You may put full weight on your legs and walk as much as is comfortable. ?Avoid periods of inactivity such as sitting longer than an hour when not asleep. This helps prevent blood  clots.  ?You may return to work once you are cleared by your surgeon.  ?Do not drive a car for 6 weeks or until released by your surgeon.  ?Do not drive while taking narcotics.  ?Wear elastic stockings for two weeks following surgery during the day but you may remove then at night.  ?Make sure you keep all of your appointments after your operation with all of your doctors and caregivers. You should call the office at the above phone number and make an appointment for approximately two weeks after the date of your surgery. ?Please pick up a stool softener and laxative for home use as long as you are requiring pain medications. ?ICE to the affected hip every three hours for 30 minutes at a time and then as needed for pain and swelling. Continue to use ice on the hip for pain and swelling from surgery. You may notice swelling that will progress down to the foot and ankle.  This is normal after surgery.  Elevate the leg when you are not up walking on it.   ?It is important for you to complete the blood thinner medication as prescribed by your doctor. ?Continue to use the breathing machine which will help keep your temperature down.  It is common for your temperature to cycle up and down following surgery, especially at night when you are not up moving around and exerting yourself.  The breathing machine keeps your lungs expanded and your temperature down. ? ?RANGE OF MOTION AND STRENGTHENING EXERCISES  ?These exercises are designed to help you   keep full movement of your hip joint. Follow your caregiver's or physical therapist's instructions. Perform all exercises about fifteen times, three times per day or as directed. Exercise both hips, even if you have had only one joint replacement. These exercises can be done on a training (exercise) mat, on the floor, on a table or on a bed. Use whatever works the best and is most comfortable for you. Use music or television while you are exercising so that the exercises are a  pleasant break in your day. This will make your life better with the exercises acting as a break in routine you can look forward to.  ?Lying on your back, slowly slide your foot toward your buttocks, raising your knee up off the floor. Then slowly slide your foot back down until your leg is straight again.  ?Lying on your back spread your legs as far apart as you can without causing discomfort.  ?Lying on your side, raise your upper leg and foot straight up from the floor as far as is comfortable. Slowly lower the leg and repeat.  ?Lying on your back, tighten up the muscle in the front of your thigh (quadriceps muscles). You can do this by keeping your leg straight and trying to raise your heel off the floor. This helps strengthen the largest muscle supporting your knee.  ?Lying on your back, tighten up the muscles of your buttocks both with the legs straight and with the knee bent at a comfortable angle while keeping your heel on the floor.  ? ?SKILLED REHAB INSTRUCTIONS: ?If the patient is transferred to a skilled rehab facility following release from the hospital, a list of the current medications will be sent to the facility for the patient to continue.  When discharged from the skilled rehab facility, please have the facility set up the patient's Home Health Physical Therapy prior to being released. Also, the skilled facility will be responsible for providing the patient with their medications at time of release from the facility to include their pain medication and their blood thinner medication. If the patient is still at the rehab facility at time of the two week follow up appointment, the skilled rehab facility will also need to assist the patient in arranging follow up appointment in our office and any transportation needs. ? ?POST-OPERATIVE OPIOID TAPER INSTRUCTIONS: ?It is important to wean off of your opioid medication as soon as possible. If you do not need pain medication after your surgery it is ok  to stop day one. ?Opioids include: ?Codeine, Hydrocodone(Norco, Vicodin), Oxycodone(Percocet, oxycontin) and hydromorphone amongst others.  ?Long term and even short term use of opiods can cause: ?Increased pain response ?Dependence ?Constipation ?Depression ?Respiratory depression ?And more.  ?Withdrawal symptoms can include ?Flu like symptoms ?Nausea, vomiting ?And more ?Techniques to manage these symptoms ?Hydrate well ?Eat regular healthy meals ?Stay active ?Use relaxation techniques(deep breathing, meditating, yoga) ?Do Not substitute Alcohol to help with tapering ?If you have been on opioids for less than two weeks and do not have pain than it is ok to stop all together.  ?Plan to wean off of opioids ?This plan should start within one week post op of your joint replacement. ?Maintain the same interval or time between taking each dose and first decrease the dose.  ?Cut the total daily intake of opioids by one tablet each day ?Next start to increase the time between doses. ?The last dose that should be eliminated is the evening dose.  ? ? ?MAKE   SURE YOU:  ?Understand these instructions.  ?Will watch your condition.  ?Will get help right away if you are not doing well or get worse. ? ?Pick up stool softner and laxative for home use following surgery while on pain medications. ?Do not remove your dressing. ?The dressing is waterproof--it is OK to take showers. ?Continue to use ice for pain and swelling after surgery. ?Do not use any lotions or creams on the incision until instructed by your surgeon. ?Total Hip Protocol. ? ?

## 2023-04-08 NOTE — Op Note (Deleted)
OPERATIVE REPORT  SURGEON: Samson Frederic, MD   ASSISTANT: Clint Bolder, PA-C.  PREOPERATIVE DIAGNOSIS: Right hip arthritis with protrusio deformity.   POSTOPERATIVE DIAGNOSIS: Right hip arthritis with protrusio deformity.   PROCEDURE: Right total hip arthroplasty, anterior approach.   IMPLANTS: Biomet Taperloc Complete Microplasty stem, size 9 x 102.5 mm, high offset. Biomet G7 OsseoTi Cup, size 52 mm. Biomet Vivacit-E liner, size 36 mm, E,  neutral. Biomet Biolox ceramic head ball, size 36 mm with -6mm taper adapter.  ANESTHESIA:  MAC and Spinal  ESTIMATED BLOOD LOSS: 350 mL.    ANTIBIOTICS: 2 g Ancef.  DRAINS: None.  COMPLICATIONS: None.   CONDITION: PACU - hemodynamically stable.   BRIEF CLINICAL NOTE: Kathryn [name] is a 67 y.o. female with a long-standing history of Right hip arthritis. After failing conservative management, the patient was indicated for total hip arthroplasty. The risks, benefits, and alternatives to the procedure were explained, and the patient elected to proceed.  PROCEDURE IN DETAIL: Surgical site was marked by myself in the pre-op holding area. Once inside the operating room, spinal anesthesia was obtained, and a foley catheter was inserted. The patient was then positioned on the Hana table.  All bony prominences were well padded.  The hip was prepped and draped in the normal sterile surgical fashion.  A time-out was called verifying side and site of surgery. The patient received IV antibiotics within 60 minutes of beginning the procedure.   Bikini incision was made, and superficial dissection was performed lateral to the ASIS. The direct anterior approach to the hip was performed through the Hueter interval.  Lateral femoral circumflex vessels were treated with the Auqumantys. The anterior capsule was exposed and an inverted T capsulotomy was made. The femoral neck cut was made to the level of the templated cut.  A corkscrew was placed into the  head and the head was removed.  The femoral head was found to have eburnated bone. The head was passed to the back table and was measured. Pubofemoral ligament was released off of the calcar, taking care to stay on bone. Superior capsule was released from the greater trochanter, taking care to stay lateral to the posterior border of the femoral neck in order to preserve the short external rotators.   Acetabular exposure was achieved, and the pulvinar and labrum were excised. Sequential reaming of the acetabulum was then performed up to a size 51 mm reamer. A 52 mm cup was then opened and impacted into place at approximately 40 degrees of abduction and 20 degrees of anteversion. The final polyethylene liner was impacted into place and acetabular osteophytes were removed.    I then gained femoral exposure taking care to protect the abductors and greater trochanter.  This was performed using standard external rotation, extension, and adduction.  A cookie cutter was used to enter the femoral canal, and then the femoral canal finder was placed.  Sequential broaching was performed up to a size 9.  Calcar planer was used on the femoral neck remnant.  I placed a high offset neck and a trial head ball.  The hip was reduced.  Leg lengths and offset were checked fluoroscopically.  The hip was dislocated and trial components were removed.  The final implants were placed, and the hip was reduced.  Fluoroscopy was used to confirm component position and leg lengths.  At 90 degrees of external rotation and full extension, the hip was stable to an anterior directed force.   The wound was copiously  irrigated with Prontosan solution and normal saline using pule lavage.  Marcaine solution was injected into the periarticular soft tissue.  The wound was closed in layers using #1 Vicryl and V-Loc for the fascia, 2-0 Vicryl for the subcutaneous fat, 2-0 Monocryl for the deep dermal layer, 3-0 running Monocryl subcuticular stitch, and  Dermabond for the skin.  Once the glue was fully dried, an Aquacell Ag dressing was applied.  The patient was transported to the recovery room in stable condition.  Sponge, needle, and instrument counts were correct at the end of the case x2.  The patient tolerated the procedure well and there were no known complications.  The aquamantis was utilized for this case to help facilitate better hemostasis as patient was felt to be at increased risk of bleeding because of obesity.  A oscillating saw tip was utilized for this case to prevent damage to the soft tissue structures such as muscles, ligaments and tendons, and to ensure accurate bone cuts. This patient was at increased risk for above structures due to  minimally invasive approach.  Please note that a surgical assistant was a medical necessity for this procedure to perform it in a safe and expeditious manner. Assistant was necessary to provide appropriate retraction of vital neurovascular structures, to prevent femoral fracture, and to allow for anatomic placement of the prosthesis.

## 2023-04-09 DIAGNOSIS — Z7901 Long term (current) use of anticoagulants: Secondary | ICD-10-CM | POA: Diagnosis not present

## 2023-04-09 DIAGNOSIS — F32A Depression, unspecified: Secondary | ICD-10-CM | POA: Diagnosis not present

## 2023-04-09 DIAGNOSIS — M1612 Unilateral primary osteoarthritis, left hip: Secondary | ICD-10-CM | POA: Diagnosis not present

## 2023-04-09 DIAGNOSIS — D759 Disease of blood and blood-forming organs, unspecified: Secondary | ICD-10-CM | POA: Diagnosis not present

## 2023-04-09 DIAGNOSIS — F419 Anxiety disorder, unspecified: Secondary | ICD-10-CM | POA: Diagnosis not present

## 2023-04-09 DIAGNOSIS — I4891 Unspecified atrial fibrillation: Secondary | ICD-10-CM | POA: Diagnosis not present

## 2023-04-09 DIAGNOSIS — K449 Diaphragmatic hernia without obstruction or gangrene: Secondary | ICD-10-CM | POA: Diagnosis not present

## 2023-04-09 DIAGNOSIS — K219 Gastro-esophageal reflux disease without esophagitis: Secondary | ICD-10-CM | POA: Diagnosis not present

## 2023-04-09 DIAGNOSIS — I10 Essential (primary) hypertension: Secondary | ICD-10-CM | POA: Diagnosis not present

## 2023-04-09 DIAGNOSIS — Z981 Arthrodesis status: Secondary | ICD-10-CM | POA: Diagnosis not present

## 2023-04-09 DIAGNOSIS — D649 Anemia, unspecified: Secondary | ICD-10-CM | POA: Diagnosis not present

## 2023-04-09 DIAGNOSIS — I350 Nonrheumatic aortic (valve) stenosis: Secondary | ICD-10-CM | POA: Diagnosis not present

## 2023-04-09 DIAGNOSIS — M25752 Osteophyte, left hip: Secondary | ICD-10-CM | POA: Diagnosis not present

## 2023-04-09 LAB — CBC
HCT: 27.1 % — ABNORMAL LOW (ref 36.0–46.0)
Hemoglobin: 8.3 g/dL — ABNORMAL LOW (ref 12.0–15.0)
MCH: 27.6 pg (ref 26.0–34.0)
MCHC: 30.6 g/dL (ref 30.0–36.0)
MCV: 90 fL (ref 80.0–100.0)
Platelets: 252 10*3/uL (ref 150–400)
RBC: 3.01 MIL/uL — ABNORMAL LOW (ref 3.87–5.11)
RDW: 15.5 % (ref 11.5–15.5)
WBC: 12.5 10*3/uL — ABNORMAL HIGH (ref 4.0–10.5)
nRBC: 0 % (ref 0.0–0.2)

## 2023-04-09 LAB — BASIC METABOLIC PANEL
Anion gap: 8 (ref 5–15)
BUN: 19 mg/dL (ref 8–23)
CO2: 28 mmol/L (ref 22–32)
Calcium: 8 mg/dL — ABNORMAL LOW (ref 8.9–10.3)
Chloride: 97 mmol/L — ABNORMAL LOW (ref 98–111)
Creatinine, Ser: 0.89 mg/dL (ref 0.44–1.00)
GFR, Estimated: >60 mL/min (ref >60)
Glucose, Bld: 124 mg/dL — ABNORMAL HIGH (ref 70–99)
Potassium: 4.1 mmol/L (ref 3.5–5.1)
Sodium: 133 mmol/L — ABNORMAL LOW (ref 135–145)

## 2023-04-09 MED ORDER — SENNA 8.6 MG PO TABS: 2.0000 | ORAL_TABLET | Freq: Every day | ORAL | 0 refills | Status: AC

## 2023-04-09 MED ORDER — OXYCODONE-ACETAMINOPHEN 7.5-325 MG PO TABS: 1.0000 | ORAL_TABLET | ORAL | 0 refills | Status: AC | PRN

## 2023-04-09 MED ORDER — DOCUSATE SODIUM 100 MG PO CAPS: 100.0000 mg | ORAL_CAPSULE | Freq: Two times a day (BID) | ORAL | 0 refills | Status: AC

## 2023-04-09 MED ORDER — POLYETHYLENE GLYCOL 3350 17 G PO PACK: 17.0000 g | PACK | Freq: Every day | ORAL | 0 refills | Status: AC | PRN

## 2023-04-09 MED ORDER — METHOCARBAMOL 500 MG PO TABS: 500.0000 mg | ORAL_TABLET | Freq: Four times a day (QID) | ORAL | 0 refills | Status: DC | PRN

## 2023-04-09 MED ORDER — ONDANSETRON HCL 4 MG PO TABS: 4.0000 mg | ORAL_TABLET | Freq: Three times a day (TID) | ORAL | 0 refills | Status: AC | PRN

## 2023-04-09 NOTE — TOC Transition Note (Signed)
Transition of Care [facility]) - CM/SW Discharge Note  Patient Details  Name: Kathryn [name] MRN: [id number] Date of Birth: 05/03/1956  [facility] CM/SW Contact:  Ewing Schlein, LCSW Phone Number: 04/09/2023, 11:25 AM  Clinical Narrative: Patient will discharge home after passing with PT. CSW met with patient and family to review discharge plan. Patient will discharge home with a home exercise program (HEP). Patient has a rolling walker at home, so there are no DME needs at this time. TOC signing off.   Final next level of care: Home/Self Care Barriers to Discharge: No Barriers Identified  Patient Goals and CMS Choice Choice offered to / list presented to : NA  Discharge Plan and Services Additional resources added to the After Visit Summary for       DME Arranged: N/A DME Agency: NA  Social Determinants of Health (SDOH) Interventions SDOH Screenings   Housing: Patient Unable To Answer (04/08/2023)  Tobacco Use: Low Risk  (04/08/2023)   Readmission Risk Interventions     No data to display

## 2023-04-09 NOTE — Progress Notes (Signed)
Physical Therapy Treatment Patient Details Name: Kathryn [name] MRN: [id number] DOB: 03-29-1956 Today's Date: 04/09/2023   History of Present Illness Pt is 67 yo female admitted 04/08/23 for L anterior THA.  Pt with hx including but not limited to Bil TKA, C3-C29fusion, L 5 laminectomy, bil carpal tunnel release, breast CA, lumpectomy, HTN, tremor, anxiety    PT Comments    POD # 1 General Comments: MUCH improved.  AxO x 3 pleasant and following all instructions.  "I was out of it yesterday", stated pt. Assisted OOB.  General bed mobility comments: demonsatred how to use a belt to self assist LE off bed.  Pt also crosses legs for opposite LE to help. General transfer comment: good safety cognition and use of hands to steady self.  Also assisted with a toilet transfer.  Pt able to self perform peri care in standing at Supervision level. General Gait Details: tolerated an increased functional distance of 55 feet with one VC safety with turns while in tight spaces (bathroom). General stair comments: with Family Mmember "hands on" assisted up/down 2 steps using B rails and only one VC needed on proper sequencing. Then returned to room to perform some TE's following HEP handout.  Instructed on proper tech, freq as well as use of ICE.   Addressed all mobility questions, discussed appropriate activity, educated on use of ICE.  Pt ready for D/C to home.   Recommendations for follow up therapy are one component of a multi-disciplinary discharge planning process, led by the attending physician.  Recommendations may be updated based on patient status, additional functional criteria and insurance authorization.  Follow Up Recommendations       Assistance Recommended at Discharge Intermittent Supervision/Assistance  Patient can return home with the following A little help with walking and/or transfers;A little help with bathing/dressing/bathroom;Assistance with cooking/housework;Help with stairs or ramp for  entrance   Equipment Recommendations  None recommended by PT    Recommendations for Other Services       Precautions / Restrictions Precautions Precautions: Fall Restrictions Weight Bearing Restrictions: No LLE Weight Bearing: Weight bearing as tolerated     Mobility  Bed Mobility Overal bed mobility: Needs Assistance Bed Mobility: Supine to Sit     Supine to sit: Supervision, Min guard     General bed mobility comments: demonsatred how to use a belt to self assist LE off bed.  Pt also crosses legs for opposite LE to help.    Transfers Overall transfer level: Needs assistance Equipment used: Rolling walker (2 wheels)   Sit to Stand: Supervision, Min guard           General transfer comment: good safety cognition and use of hands to steady self.  Also assisted with a toilet transfer.  Pt able to self perform peri care in standing at Supervision level.    Ambulation/Gait Ambulation/Gait assistance: Supervision, Min guard Gait Distance (Feet): 55 Feet Assistive device: Rolling walker (2 wheels) Gait Pattern/deviations: Decreased stride length, Step-through pattern, Decreased stance time - left Gait velocity: decreased     General Gait Details: tolerated an increased functional distance of 55 feet with one VC safety with turns while in tight spaces (bathroom).   Stairs Stairs: Yes Stairs assistance: Supervision, Min guard Stair Management: Two rails, Step to pattern, Forwards Number of Stairs: 2 General stair comments: with Family Mmember "hands on" assisted up/down 2 steps using B rails and only one VC needed on proper sequencing.   Wheelchair Mobility    Modified  Rankin (Stroke Patients Only)       Balance                                            Cognition Arousal/Alertness: Awake/alert Behavior During Therapy: WFL for tasks assessed/performed Overall Cognitive Status: Within Functional Limits for tasks assessed                                  General Comments: MUCH improved.  AxO x 3 pleasant and following all instructions.  "I was out of it yesterday", stated pt.        Exercises   Total Hip Replacement TE's following HEP Handout 10 reps ankle pumps 05 reps knee presses 05 reps heel slides 05 reps SAQ's 05 reps ABD Instructed how to use a belt loop to assist  Followed by ICE     General Comments        Pertinent Vitals/Pain Pain Assessment Pain Assessment: 0-10 Pain Score: 5  Pain Location: L hip with movement Pain Descriptors / Indicators: Aching, Operative site guarding, Grimacing, Tender Pain Intervention(s): Monitored during session, Premedicated before session, Repositioned, Ice applied    Home Living                          Prior Function            PT Goals (current goals can now be found in the care plan section) Progress towards PT goals: Progressing toward goals    Frequency    7X/week      PT Plan Current plan remains appropriate    Co-evaluation              AM-PAC PT "6 Clicks" Mobility   Outcome Measure  Help needed turning from your back to your side while in a flat bed without using bedrails?: A Little Help needed moving from lying on your back to sitting on the side of a flat bed without using bedrails?: A Little Help needed moving to and from a bed to a chair (including a wheelchair)?: A Little Help needed standing up from a chair using your arms (e.g., wheelchair or bedside chair)?: A Little Help needed to walk in hospital room?: A Little Help needed climbing 3-5 steps with a railing? : A Little 6 Click Score: 18    End of Session Equipment Utilized During Treatment: Gait belt Activity Tolerance: Patient tolerated treatment well Patient left: in bed;with call bell/phone within reach;with family/visitor present;with nursing/sitter in room Nurse Communication: Mobility status PT Visit Diagnosis: Other abnormalities of  gait and mobility (R26.89);Muscle weakness (generalized) (M62.81)     Time: 1610-9604 PT Time Calculation (min) (ACUTE ONLY): 41 min  Charges:  $Gait Training: 8-22 mins $Therapeutic Exercise: 8-22 mins $Therapeutic Activity: 8-22 mins                     Felecia Shelling  PTA Acute  Rehabilitation Services Office M-F          [phone]

## 2023-04-09 NOTE — Discharge Summary (Signed)
Physician Discharge Summary  Patient ID: Kathryn [name] MRN: [id number] DOB/AGE: [birth year] 67 y.o.  Admit date: 04/08/2023 Discharge date: 04/09/2023  Admission Diagnoses:  Primary osteoarthritis of left hip  Discharge Diagnoses:  Principal Problem:   Primary osteoarthritis of left hip   Past Medical History:  Diagnosis Date   Anemia    Anxiety    Breast cancer (HCC)    Right breast, had lumpectomy only no chemo, radiation   Cancer (HCC) 10/2016   Breast right,  lumpectomy,  did not require chemo/xrt   DDD (degenerative disc disease), cervical    Depression    Dyspnea    Dysrhythmia    A Fib   Elevated cholesterol    GERD (gastroesophageal reflux disease)    H/O hiatal hernia    Headache    Hypertension    Hypothyroidism    Paroxysmal atrial fibrillation (HCC) 11/20/2020   PONV (postoperative nausea and vomiting)    Tremor    in hands and down left leg   Wears partial dentures    upper    Surgeries: Procedure(s): TOTAL HIP ARTHROPLASTY ANTERIOR APPROACH on 04/08/2023   Consultants (if any):   Discharged Condition: Improved  Hospital Course: Kathryn [name] is an 67 y.o. female who was admitted 04/08/2023 with a diagnosis of Primary osteoarthritis of left hip and went to the operating room on 04/08/2023 and underwent the above named procedures.    She was given perioperative antibiotics:  Anti-infectives (From admission, onward)    Start     Dose/Rate Route Frequency Ordered Stop   04/08/23 1645  ceFAZolin (ANCEF) IVPB 2g/100 mL premix        2 g 200 mL/hr over 30 Minutes Intravenous Every 6 hours 04/08/23 1410 04/08/23 2300   04/08/23 0815  ceFAZolin (ANCEF) IVPB 2g/100 mL premix        2 g 200 mL/hr over 30 Minutes Intravenous On call to O.R. 04/08/23 6962 04/08/23 1102       She was given sequential compression devices, early ambulation, and Eliquis for DVT prophylaxis.  POD#1 She ambulated well with PT and was discharged home with HEP.   She  benefited maximally from the hospital stay and there were no complications.    Recent vital signs:  Vitals:   04/09/23 0551 04/09/23 0857  BP: 132/72 (!) 105/59  Pulse: 68 63  Resp: 16 16  Temp: (!) 97.5 F (36.4 C) 98.4 F (36.9 C)  SpO2: 97% 98%    Recent laboratory studies:  Lab Results  Component Value Date   HGB 8.3 (L) 04/09/2023   HGB 11.8 (L) 03/29/2023   HGB 11.4 12/07/2022   Lab Results  Component Value Date   WBC 12.5 (H) 04/09/2023   PLT 252 04/09/2023   Lab Results  Component Value Date   INR 0.91 01/05/2012   Lab Results  Component Value Date   NA 133 (L) 04/09/2023   K 4.1 04/09/2023   CL 97 (L) 04/09/2023   CO2 28 04/09/2023   BUN 19 04/09/2023   CREATININE 0.89 04/09/2023   GLUCOSE 124 (H) 04/09/2023     Allergies as of 04/09/2023   No Known Allergies      Medication List     STOP taking these medications    oxyCODONE-acetaminophen 5-325 MG tablet Commonly known as: PERCOCET/ROXICET Replaced by: oxyCODONE-acetaminophen 7.5-325 MG tablet       TAKE these medications    colchicine 0.6 MG tablet Take 0.6 mg by mouth as needed (  gout flare).   diltiazem 30 MG tablet Commonly known as: Cardizem Take 1 tablet every 4 hours AS NEEDED for heart rate >100 as long as blood pressure >100.   docusate sodium 100 MG capsule Commonly known as: Colace Take 1 capsule (100 mg total) by mouth 2 (two) times daily.   DULoxetine 60 MG capsule Commonly known as: CYMBALTA Take 60 mg by mouth See admin instructions.   DULoxetine 30 MG capsule Commonly known as: CYMBALTA Take 30 mg by mouth daily.   Eliquis 5 MG Tabs tablet Generic drug: apixaban Take 5 mg by mouth at bedtime.   furosemide 20 MG tablet Commonly known as: LASIX TAKE 1 TABLET BY MOUTH DAILY AS NEEDED   gabapentin 600 MG tablet Commonly known as: NEURONTIN Take 600 mg by mouth 3 (three) times daily.   hydrochlorothiazide 25 MG tablet Commonly known as: HYDRODIURIL Take 1  tablet (25 mg total) by mouth daily.   levothyroxine 200 MCG tablet Commonly known as: SYNTHROID Take 200 mcg by mouth daily before breakfast.   methocarbamol 500 MG tablet Commonly known as: ROBAXIN Take 1 tablet (500 mg total) by mouth every 6 (six) hours as needed for muscle spasms.   metoprolol succinate 100 MG 24 hr tablet Commonly known as: TOPROL-XL TAKE 1 TABLET BY MOUTH DAILY WITH OR IMMEDIATELY FOLLOWING A MEAL   omeprazole 20 MG capsule Commonly known as: PRILOSEC Take 20 mg by mouth 2 (two) times daily.   ondansetron 4 MG tablet Commonly known as: Zofran Take 1 tablet (4 mg total) by mouth every 8 (eight) hours as needed for nausea or vomiting.   oxyCODONE-acetaminophen 7.5-325 MG tablet Commonly known as: Percocet Take 1 tablet by mouth every 4 (four) hours as needed for up to 7 days for severe pain. Replaces: oxyCODONE-acetaminophen 5-325 MG tablet   polyethylene glycol 17 g packet Commonly known as: MiraLax Take 17 g by mouth daily as needed for mild constipation or moderate constipation.   Salonpas 3.11-28-08 % Ptch Generic drug: Camphor-Menthol-Methyl Sal Place 1 patch onto the skin daily as needed (left hip pain).   senna 8.6 MG Tabs tablet Commonly known as: SENOKOT Take 2 tablets (17.2 mg total) by mouth at bedtime for 15 days.               Discharge Care Instructions  (From admission, onward)           Start     Ordered   04/09/23 0000  Weight bearing as tolerated        04/09/23 0858   04/09/23 0000  Change dressing       Comments: Do not change your dressing.   04/09/23 0858              WEIGHT BEARING   Weight bearing as tolerated with assist device (walker, cane, etc) as directed, use it as long as suggested by your surgeon or therapist, typically at least 4-6 weeks.   EXERCISES  Results after joint replacement surgery are often greatly improved when you follow the exercise, range of motion and muscle strengthening  exercises prescribed by your doctor. Safety measures are also important to protect the joint from further injury. Any time any of these exercises cause you to have increased pain or swelling, decrease what you are doing until you are comfortable again and then slowly increase them. If you have problems or questions, call your caregiver or physical therapist for advice.   Rehabilitation is important following a joint replacement.  After just a few days of immobilization, the muscles of the leg can become weakened and shrink (atrophy).  These exercises are designed to build up the tone and strength of the thigh and leg muscles and to improve motion. Often times heat used for twenty to thirty minutes before working out will loosen up your tissues and help with improving the range of motion but do not use heat for the first two weeks following surgery (sometimes heat can increase post-operative swelling).   These exercises can be done on a training (exercise) mat, on the floor, on a table or on a bed. Use whatever works the best and is most comfortable for you.    Use music or television while you are exercising so that the exercises are a pleasant break in your day. This will make your life better with the exercises acting as a break in your routine that you can look forward to.   Perform all exercises about fifteen times, three times per day or as directed.  You should exercise both the operative leg and the other leg as well.  Exercises include:   Quad Sets - Tighten up the muscle on the front of the thigh (Quad) and hold for 5-10 seconds.   Straight Leg Raises - With your knee straight (if you were given a brace, keep it on), lift the leg to 60 degrees, hold for 3 seconds, and slowly lower the leg.  Perform this exercise against resistance later as your leg gets stronger.  Leg Slides: Lying on your back, slowly slide your foot toward your buttocks, bending your knee up off the floor (only go as far as is  comfortable). Then slowly slide your foot back down until your leg is flat on the floor again.  Angel Wings: Lying on your back spread your legs to the side as far apart as you can without causing discomfort.  Hamstring Strength:  Lying on your back, push your heel against the floor with your leg straight by tightening up the muscles of your buttocks.  Repeat, but this time bend your knee to a comfortable angle, and push your heel against the floor.  You may put a pillow under the heel to make it more comfortable if necessary.   A rehabilitation program following joint replacement surgery can speed recovery and prevent re-injury in the future due to weakened muscles. Contact your doctor or a physical therapist for more information on knee rehabilitation.    CONSTIPATION  Constipation is defined medically as fewer than three stools per week and severe constipation as less than one stool per week.  Even if you have a regular bowel pattern at home, your normal regimen is likely to be disrupted due to multiple reasons following surgery.  Combination of anesthesia, postoperative narcotics, change in appetite and fluid intake all can affect your bowels.   YOU MUST use at least one of the following options; they are listed in order of increasing strength to get the job done.  They are all available over the counter, and you may need to use some, POSSIBLY even all of these options:    Drink plenty of fluids (prune juice may be helpful) and high fiber foods Colace 100 mg by mouth twice a day  Senokot for constipation as directed and as needed Dulcolax (bisacodyl), take with full glass of water  Miralax (polyethylene glycol) once or twice a day as needed.  If you have tried all these things and are unable to  have a bowel movement in the first 3-4 days after surgery call either your surgeon or your primary doctor.    If you experience loose stools or diarrhea, hold the medications until you stool forms back  up.  If your symptoms do not get better within 1 week or if they get worse, check with your doctor.  If you experience "the worst abdominal pain ever" or develop nausea or vomiting, please contact the office immediately for further recommendations for treatment.   ITCHING:  If you experience itching with your medications, try taking only a single pain pill, or even half a pain pill at a time.  You can also use Benadryl over the counter for itching or also to help with sleep.   TED HOSE STOCKINGS:  Use stockings on both legs until for at least 2 weeks or as directed by physician office. They may be removed at night for sleeping.  MEDICATIONS:  See your medication summary on the "After Visit Summary" that nursing will review with you.  You may have some home medications which will be placed on hold until you complete the course of blood thinner medication.  It is important for you to complete the blood thinner medication as prescribed.  PRECAUTIONS:  If you experience chest pain or shortness of breath - call 911 immediately for transfer to the hospital emergency department.   If you develop a fever greater that 101 F, purulent drainage from wound, increased redness or drainage from wound, foul odor from the wound/dressing, or calf pain - CONTACT YOUR SURGEON.                                                   FOLLOW-UP APPOINTMENTS:  If you do not already have a post-op appointment, please call the office for an appointment to be seen by your surgeon.  Guidelines for how soon to be seen are listed in your "After Visit Summary", but are typically between 1-4 weeks after surgery.  OTHER INSTRUCTIONS:   Knee Replacement:  Do not place pillow under knee, focus on keeping the knee straight while resting. CPM instructions: 0-90 degrees, 2 hours in the morning, 2 hours in the afternoon, and 2 hours in the evening. Place foam block, curve side up under heel at all times except when in CPM or when walking.  DO  NOT modify, tear, cut, or change the foam block in any way.   MAKE SURE YOU:  Understand these instructions.  Get help right away if you are not doing well or get worse.    Thank you for letting us be a part of your medical care team.  It is a privilege we respect greatly.  We hope these instructions will help you stay on track for a fast and full recovery!   Diagnostic Studies: DG Pelvis Portable  Result Date: 04/08/2023 CLINICAL DATA:  Total hip prosthesis placement EXAM: PORTABLE PELVIS 1-2 VIEWS COMPARISON:  12/30/2022 FINDINGS: Interval placement of a left total hip prosthesis noted. Small amount of expected postoperative gas in the soft tissues. No periprosthetic fracture or acute complicating feature. Moderate to prominent degenerative right hip arthropathy noted. IMPRESSION: 1. Left total hip prosthesis placement without complicating feature. 2. Moderate to prominent degenerative right hip arthropathy. Electronically Signed   By: Gaylyn Rong M.D.   On: 04/08/2023 13:19  DG HIP UNILAT WITH PELVIS 1V LEFT  Result Date: 04/08/2023 CLINICAL DATA:  Insert his EXAM: DG HIP (WITH OR WITHOUT PELVIS) 1V*L* COMPARISON:  None Available. FINDINGS: Intraoperative images during left hip arthroplasty. Normal alignment. No evidence of loosening or periprosthetic fracture. Expected soft tissue changes. IMPRESSION: Intraoperative images during left hip arthroplasty. No evidence of immediate hardware complication. Normal alignment. Electronically Signed   By: Caprice Renshaw M.D.   On: 04/08/2023 12:23   DG C-Arm 1-60 Min-No Report  Result Date: 04/08/2023 Fluoroscopy was utilized by the requesting physician.  No radiographic interpretation.    Disposition: Discharge disposition: 01-Home or Self Care       Discharge Instructions     Call MD / Call 911   Complete by: As directed    If you experience chest pain or shortness of breath, CALL 911 and be transported to the hospital emergency  room.  If you develope a fever above 101 F, pus (white drainage) or increased drainage or redness at the wound, or calf pain, call your surgeon's office.   Change dressing   Complete by: As directed    Do not change your dressing.   Constipation Prevention   Complete by: As directed    Drink plenty of fluids.  Prune juice may be helpful.  You may use a stool softener, such as Colace (over the counter) 100 mg twice a day.  Use MiraLax (over the counter) for constipation as needed.   Diet - low sodium heart healthy   Complete by: As directed    Discharge instructions   Complete by: As directed    Elevate toes above nose. Use cryotherapy as needed for pain and swelling.   Driving restrictions   Complete by: As directed    No driving for 6 weeks   Increase activity slowly as tolerated   Complete by: As directed    Lifting restrictions   Complete by: As directed    No lifting for 6 weeks   Post-operative opioid taper instructions:   Complete by: As directed    POST-OPERATIVE OPIOID TAPER INSTRUCTIONS: It is important to wean off of your opioid medication as soon as possible. If you do not need pain medication after your surgery it is ok to stop day one. Opioids include: Codeine, Hydrocodone(Norco, Vicodin), Oxycodone(Percocet, oxycontin) and hydromorphone amongst others.  Long term and even short term use of opiods can cause: Increased pain response Dependence Constipation Depression Respiratory depression And more.  Withdrawal symptoms can include Flu like symptoms Nausea, vomiting And more Techniques to manage these symptoms Hydrate well Eat regular healthy meals Stay active Use relaxation techniques(deep breathing, meditating, yoga) Do Not substitute Alcohol to help with tapering If you have been on opioids for less than two weeks and do not have pain than it is ok to stop all together.  Plan to wean off of opioids This plan should start within one week post op of your  joint replacement. Maintain the same interval or time between taking each dose and first decrease the dose.  Cut the total daily intake of opioids by one tablet each day Next start to increase the time between doses. The last dose that should be eliminated is the evening dose.      TED hose   Complete by: As directed    Use stockings (TED hose) for 2 weeks on both leg(s).  You may remove them at night for sleeping.   Weight bearing as tolerated   Complete by:  As directed         Follow-up Information     Clois Dupes, PA-C. Schedule an appointment as soon as possible for a visit in 2 week(s).   Specialty: Orthopedic Surgery Why: For suture removal, For wound re-check Contact information: [address] [place] [address] [phone]                  Signed: Clois Dupes 04/09/2023, 3:42 PM

## 2023-04-09 NOTE — Plan of Care (Signed)
  Problem: Education: Goal: Knowledge of the prescribed therapeutic regimen will improve Outcome: Progressing Goal: Understanding of discharge needs will improve Outcome: Progressing Goal: Individualized Educational Video(s) Outcome: Progressing   Problem: Activity: Goal: Ability to avoid complications of mobility impairment will improve Outcome: Progressing Goal: Ability to tolerate increased activity will improve Outcome: Progressing   Problem: Pain Management: Goal: Pain level will decrease with appropriate interventions Outcome: Progressing   

## 2023-04-09 NOTE — Progress Notes (Signed)
    Subjective:  Patient reports pain as mild to moderate.  Denies N/V/CP/SOB/Abd pain. She states she has baseline numbness in feet bilaterally. No other sensory deficits. Pain well controlled on medication.   Husband at bedside. All questions solicited and answered.   Objective:   VITALS:   Vitals:   04/08/23 1822 04/08/23 2201 04/09/23 0207 04/09/23 0551  BP: 116/65 120/70 123/67 132/72  Pulse: (!) 53 (!) 57 65 68  Resp: 18 16 15 16   Temp: 97.7 F (36.5 C) 97.7 F (36.5 C) 97.6 F (36.4 C) (!) 97.5 F (36.4 C)  TempSrc: Oral Oral Oral Oral  SpO2: 100% 100% 96% 97%  Weight:      Height:        Patient sitting up in bed. NAD.  Neurologically intact ABD soft Neurovascular intact Intact pulses distally Dorsiflexion/Plantar flexion intact Incision: dressing C/D/I No cellulitis present Compartment soft he states she has baseline numbness in feet bilaterally. No other sensory deficits.  Lab Results  Component Value Date   WBC 12.5 (H) 04/09/2023   HGB 8.3 (L) 04/09/2023   HCT 27.1 (L) 04/09/2023   MCV 90.0 04/09/2023   PLT 252 04/09/2023   BMET    Component Value Date/Time   NA 133 (L) 04/09/2023 0339   NA 138 01/28/2023 1012   NA 142 02/09/2017 1502   K 4.1 04/09/2023 0339   K 3.9 02/09/2017 1502   CL 97 (L) 04/09/2023 0339   CO2 28 04/09/2023 0339   CO2 26 02/09/2017 1502   GLUCOSE 124 (H) 04/09/2023 0339   GLUCOSE 103 02/09/2017 1502   BUN 19 04/09/2023 0339   BUN 15 01/28/2023 1012   BUN 12.3 02/09/2017 1502   CREATININE 0.89 04/09/2023 0339   CREATININE 1.14 (H) 06/08/2022 1308   CREATININE 0.9 02/09/2017 1502   CALCIUM 8.0 (L) 04/09/2023 0339   CALCIUM 9.6 02/09/2017 1502   EGFR 74 01/28/2023 1012   GFRNONAA >60 04/09/2023 0339     Assessment/Plan: 1 Day Post-Op   Principal Problem:   Primary osteoarthritis of left hip  ABLA. Hemoglobin 8.3. Asymptomatic. Continue to monitor.   WBAT with walker DVT ppx:  Eliquis , SCDs, TEDS PO pain  control PT/OT: Patient ambulated 20 feet with PT yesterday.  Dispo:D/c home once cleared with PT.    Clois Dupes, PA-C 04/09/2023, 8:52 AM   [facility]  [place] [address], [place], [place] [address] Phone: [phone] [email or web address] Facebook  [employer]

## 2023-04-12 ENCOUNTER — Encounter (HOSPITAL_COMMUNITY): Payer: Self-pay | Admitting: Orthopedic Surgery

## 2023-04-23 DIAGNOSIS — Z96642 Presence of left artificial hip joint: Secondary | ICD-10-CM | POA: Diagnosis not present

## 2023-04-23 DIAGNOSIS — Z471 Aftercare following joint replacement surgery: Secondary | ICD-10-CM | POA: Diagnosis not present

## 2023-05-04 ENCOUNTER — Inpatient Hospital Stay: Payer: Medicare Other | Attending: Hematology and Oncology | Admitting: Hematology and Oncology

## 2023-05-18 ENCOUNTER — Other Ambulatory Visit (HOSPITAL_COMMUNITY): Payer: Self-pay | Admitting: Physician Assistant

## 2023-06-14 DIAGNOSIS — Z96642 Presence of left artificial hip joint: Secondary | ICD-10-CM | POA: Diagnosis not present

## 2023-06-30 DIAGNOSIS — F1120 Opioid dependence, uncomplicated: Secondary | ICD-10-CM | POA: Diagnosis not present

## 2023-06-30 DIAGNOSIS — G894 Chronic pain syndrome: Secondary | ICD-10-CM | POA: Diagnosis not present

## 2023-06-30 DIAGNOSIS — M48062 Spinal stenosis, lumbar region with neurogenic claudication: Secondary | ICD-10-CM | POA: Diagnosis not present

## 2023-06-30 DIAGNOSIS — M1612 Unilateral primary osteoarthritis, left hip: Secondary | ICD-10-CM | POA: Diagnosis not present

## 2023-08-02 ENCOUNTER — Encounter (HOSPITAL_COMMUNITY): Payer: Self-pay | Admitting: Gastroenterology

## 2023-08-02 ENCOUNTER — Other Ambulatory Visit: Payer: Self-pay | Admitting: Gastroenterology

## 2023-08-02 ENCOUNTER — Telehealth: Payer: Self-pay | Admitting: *Deleted

## 2023-08-02 DIAGNOSIS — K219 Gastro-esophageal reflux disease without esophagitis: Secondary | ICD-10-CM | POA: Diagnosis not present

## 2023-08-02 DIAGNOSIS — K5904 Chronic idiopathic constipation: Secondary | ICD-10-CM | POA: Diagnosis not present

## 2023-08-02 DIAGNOSIS — R1319 Other dysphagia: Secondary | ICD-10-CM | POA: Diagnosis not present

## 2023-08-02 NOTE — Telephone Encounter (Signed)
APPT WITH ANDY TILLERY, PAC IS PT'S 6 MONTH F/U THAT SHE IS DUE FOR.

## 2023-08-02 NOTE — Telephone Encounter (Signed)
   Pre-operative Risk Assessment    Patient Name: Kathryn [name]  DOB: 09/22/1956 MRN: [id number]    DATE OF LAST VISIT: 01/28/23 DR. VARANASI DATE OF NEXT VISIT: 08/09/23 Otilio Saber, PAC  I WILL REACH OUT TO REQUESTING OFFICE PT HAS APPT 08/09/23 WITH OUR OFFICE SAME DAY AS PROCEDURE WITH DR. KARKI. WILL CONFIRM IF PROCEDURE IS URGENT OR IF OK TO WAIT UNTIL THE PT HAS BEEN SEEN BY THE CARDIOLOGIST.   Request for Surgical Clearance    Procedure:   ENDOSCOPY  Date of Surgery:  Clearance 08/09/23                                 Surgeon:  DR. [facility] Surgeon's Group or Practice Name:  [facility] Phone number:  [phone] Fax number:  [phone]   Type of Clearance Requested:   - Medical  - Pharmacy:  Hold Apixaban (Eliquis)     Type of Anesthesia:   PROPOFOL   Additional requests/questions:    Elpidio Anis   08/02/2023, 10:29 AM

## 2023-08-02 NOTE — Telephone Encounter (Signed)
Pharmacy please advise on holding apixaban prior to endoscopy scheduled for 08/09/2023. Thank you.

## 2023-08-02 NOTE — Progress Notes (Signed)
Attempted to obtain medical history via telephone, unable to reach at this time. HIPAA compliant voicemail message left requesting return call to pre surgical testing department. 

## 2023-08-02 NOTE — Progress Notes (Deleted)
Attempted to obtain medical history via telephone, unable to reach at this time. HIPAA compliant voicemail message left requesting return call to pre surgical testing department. 

## 2023-08-02 NOTE — Telephone Encounter (Signed)
Patient with diagnosis of afib on Eliquis for anticoagulation.    Procedure: endoscopy Date of procedure: 08/09/23   CHA2DS2-VASc Score = 4   This indicates a 4.8% annual risk of stroke. The patient's score is based upon: CHF History: 0 HTN History: 1 Diabetes History: 0 Stroke History: 0 Vascular Disease History: 1 Age Score: 1 Gender Score: 1      CrCl 69 ml/min  Per office protocol, patient can hold Eliquis for 2 days prior to procedure.    **This guidance is not considered finalized until pre-operative APP has relayed final recommendations.**

## 2023-08-03 ENCOUNTER — Telehealth: Payer: Self-pay | Admitting: *Deleted

## 2023-08-03 DIAGNOSIS — I10 Essential (primary) hypertension: Secondary | ICD-10-CM | POA: Diagnosis not present

## 2023-08-03 DIAGNOSIS — D509 Iron deficiency anemia, unspecified: Secondary | ICD-10-CM | POA: Diagnosis not present

## 2023-08-03 DIAGNOSIS — E039 Hypothyroidism, unspecified: Secondary | ICD-10-CM | POA: Diagnosis not present

## 2023-08-03 DIAGNOSIS — M1A00X0 Idiopathic chronic gout, unspecified site, without tophus (tophi): Secondary | ICD-10-CM | POA: Diagnosis not present

## 2023-08-03 DIAGNOSIS — I700 Atherosclerosis of aorta: Secondary | ICD-10-CM | POA: Diagnosis not present

## 2023-08-03 DIAGNOSIS — M810 Age-related osteoporosis without current pathological fracture: Secondary | ICD-10-CM | POA: Diagnosis not present

## 2023-08-03 DIAGNOSIS — E785 Hyperlipidemia, unspecified: Secondary | ICD-10-CM | POA: Diagnosis not present

## 2023-08-03 NOTE — Telephone Encounter (Signed)
Left message to call back to set up tele pre op appt this week. Add on this week ok per Joni Reining, DNP due to procedure date. I also left on vm that the pt has an appt in our office 08/09/23 with Katrina Stack, PAC the same day as her procedure.

## 2023-08-03 NOTE — Telephone Encounter (Signed)
   Name: Kathryn [name]  DOB: 1956/04/10  MRN: [id number]  Primary Cardiologist: Lance Muss, MD   Preoperative team, please contact this patient and set up a phone call appointment for further preoperative risk assessment. Please obtain consent and complete medication review. Thank you for your help.  I confirm that guidance regarding antiplatelet and oral anticoagulation therapy has been completed and, if necessary, noted below.  Per office protocol, patient can hold Eliquis for 2 days prior to procedure.     Joni Reining, NP 08/03/2023, 7:53 AM [facility]

## 2023-08-03 NOTE — Telephone Encounter (Signed)
Pt called back and has been scheduled for tele pre op appt. See previous notes ok to add on this week per Pre op APP Joni Reining, DNP due to procedure date.    I also d/w the pt about her appt 08/09/23 with Otilio Saber, PAC. She said that she forgot about that appt and will need to reschedule the appt with EP. I assure the pt that I will have the EP schedulers call her with a new appt with EP.    Med rec and consent are done.

## 2023-08-03 NOTE — Telephone Encounter (Signed)
Pt called back and has been scheduled for tele pre op appt. See previous notes ok to add on this week per Pre op APP Joni Reining, DNP due to procedure date.   I also d/w the pt about her appt 08/09/23 with Otilio Saber, PAC. She said that she forgot about that appt and will need to reschedule the appt with EP. I assure the pt that I will have the EP schedulers call her with a new appt with EP.   Med rec and consent are done.     Patient Consent for Virtual Visit        Kathryn [name] has provided verbal consent on 08/03/2023 for a virtual visit (video or telephone).   CONSENT FOR VIRTUAL VISIT FOR:  Kathryn [name]  By participating in this virtual visit I agree to the following:  I hereby voluntarily request, consent and authorize [facility] and its employed or contracted physicians, physician assistants, nurse practitioners or other licensed health care professionals (the Practitioner), to provide me with telemedicine health care services (the "Services") as deemed necessary by the treating Practitioner. I acknowledge and consent to receive the Services by the Practitioner via telemedicine. I understand that the telemedicine visit will involve communicating with the Practitioner through live audiovisual communication technology and the disclosure of certain medical information by electronic transmission. I acknowledge that I have been given the opportunity to request an in-person assessment or other available alternative prior to the telemedicine visit and am voluntarily participating in the telemedicine visit.  I understand that I have the right to withhold or withdraw my consent to the use of telemedicine in the course of my care at any time, without affecting my right to future care or treatment, and that the Practitioner or I may terminate the telemedicine visit at any time. I understand that I have the right to inspect all information obtained and/or recorded in the course  of the telemedicine visit and may receive copies of available information for a reasonable fee.  I understand that some of the potential risks of receiving the Services via telemedicine include:  Delay or interruption in medical evaluation due to technological equipment failure or disruption; Information transmitted may not be sufficient (e.g. poor resolution of images) to allow for appropriate medical decision making by the Practitioner; and/or  In rare instances, security protocols could fail, causing a breach of personal health information.  Furthermore, I acknowledge that it is my responsibility to provide information about my medical history, conditions and care that is complete and accurate to the best of my ability. I acknowledge that Practitioner's advice, recommendations, and/or decision may be based on factors not within their control, such as incomplete or inaccurate data provided by me or distortions of diagnostic images or specimens that may result from electronic transmissions. I understand that the practice of medicine is not an exact science and that Practitioner makes no warranties or guarantees regarding treatment outcomes. I acknowledge that a copy of this consent can be made available to me via my patient portal [facility] MyChart), or I can request a printed copy by calling the office of [facility].    I understand that my insurance will be billed for this visit.   I have read or had this consent read to me. I understand the contents of this consent, which adequately explains the benefits and risks of the Services being provided via telemedicine.  I have been provided ample opportunity to ask questions regarding  this consent and the Services and have had my questions answered to my satisfaction. I give my informed consent for the services to be provided through the use of telemedicine in my medical care

## 2023-08-06 ENCOUNTER — Ambulatory Visit: Payer: Medicare Other | Attending: Cardiology | Admitting: Cardiology

## 2023-08-06 DIAGNOSIS — Z01818 Encounter for other preprocedural examination: Secondary | ICD-10-CM

## 2023-08-06 NOTE — Progress Notes (Signed)
See notes per pre op APP Perlie Gold, [facility] based on pt's symptoms she needs to keep her appt 08/09/23 with Otilio Saber, PAC. GI procedure will need to be post poned until cleared.

## 2023-08-06 NOTE — H&P (Signed)
History of Present Illness    General:  67 year old female with trouble swallowing, history of robotic Heller myotomy with dor fundoplication on 01/09/2022 by Dr. 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 She had hip pain(was told it was bone on bone), she needed a left total hip replacement in 03/2023. Usually, if she lays down she has a lot of mucous, sounds raspy. She feels a knot in her mid chest. She has started to choke on solids and liquids and occasionally pills are hard to swallow and get hung on her throat, she has needed back pats for food to go down. She has lost weight, 30 plus pounds in the 2 years. She has acid reflux and heartburn, she notices it more when she lays on ther right side at night. She has nausea but no vomiting. 8 out of 10 times, she feels she throws up when she has a BM. She has irregular Bms and is constipated, she has 1 BM in 2 weeks. This has been ongoing for all her life, 30-40 years. Stools can be hard and needs to strain occasionally. She took stools softners when she had hip surgery and it helped soften the stools. She has tried linzess in the past with variable results. She has tried miralax, metamucil, benefiber, senna without relief. Current Medications Taking Cymbalta 60 MG  Capsule Delayed Release Particles 1 capsule Orally Once a day , Notes to Pharmacist: 90 mg Metoprolol Succinate 100 MG Capsule ER 24 Hour Sprinkle 1 capsule Orally Once a day Eliquis Starter Pack , Notes to Pharmacist: Pt is currently being worked up by PCP Vitamin D3 25 MCG (1000 UT) Capsule 1 capsule Orally Once a day Tylenol 325 MG Tablet 1 tablet as needed Orally every 4 hrs Synthroid(Levothyroxine Sodium) 200 MCG Tablet 1 tablet every morning on an empty stomach Orally Once a day oxyCODONE-Acetaminophen 5-325 MG Tablet 1 tablet as needed Orally As needed , Notes to Pharmacist: prn Omeprazole 20 MG Capsule Delayed Release 1 capsule 30 minutes before morning meal Orally twice a day Not-Taking Fluconazole 100 MG Tablet 1 tablet Orally Once a day Medication List reviewed and reconciled with the patient Past Medical History Acid reflux. Hiatal hernia. Hypercholesterolemia. Hypothyroidism. Adenomatous colonic polyps. Paroxysmal atrial fibrillation. Iron deficiency anemia. History of gastric ulcer in 2012. Chronic pain. Achalasia. Surgical History knee surgery, bilateral back surgery hysterectomy wrist surgery, bilateral shoulder surgery neck surgery colonoscopy- 06/17/07, 11/25/01, 05/21/98, 07/2021 (hx colon polyps) knee replacement knee arthroscopy- 10/2010, EGD- 02/03/11, 06/2021, 07/2021, 08/2022 Back surgery 11/2018 Heller myotomy (Robotic) for esophageal achalasia by Dr. Derrell Lolling 12/2021 Left hip surgery 03/2023 Family History Father: deceased Mother: deceased, colon polyps Negative family history for colon cancer and liver disease. Social History    General:  Tobacco use  cigarettes: Never smoked Tobacco history last updated 08/02/2023 EXPOSURE TO PASSIVE SMOKE: no. Alcohol: no. Recreational drug use: no. OCCUPATION: employed, admin. Marine scientist. Hospitalization/Major Diagnostic Procedure Left  hip replacement 03/2023 Review of Systems    GI PROCEDURE:   Pacemaker/ AICD no.  Artificial heart valves no.  MI/heart attack no.  Abnormal heart rhythm YES, Atrial Fibrillation.  Angina YES .  CVA no.  Hypertension  YES.  Hypotension no.  Asthma, COPD no.  Sleep apnea no.  Seizure disorders no.  Artificial joints YES, Bilateral knees , left hip replaced.  Severe DJD no.  Diabetes no.  Significant headaches YES.  Vertigo no.  Depression/anxiety  YES.  Abnormal bleeding YES, Taking blood thinners changed from Xerelto to Eliquis samples .  Kidney Disease no.  Liver disease no.  Blood transfusion no   Vital Signs Wt: 203.2, Wt change: -9 lbs, Ht: 66, BMI: 32.79, Temp: 97.5, Pulse sitting: 77, BP sitting: 125/85. Examination GENERAL APPEARANCE:  Well developed, overweight, no active distress, pleasant.  SCLERA:  anicteric.  CARDIOVASCULAR  Normal RRR .  RESPIRATORY  Breath sounds normal. Respiration even and unlabored.  ABDOMEN  No masses palpated. Liver and spleen not palpated, normal. Bowel sounds normal, Abdomen not distended.  EXTREMITIES:  No edema.  NEURO:  alert, oriented to time, place and person, normal gait.  PSYCH:  mood/affect normal.  Assessments 1. Esophageal dysphagia - R13.19 (Primary)    2. Chronic GERD - K21.9    3. Chronic idiopathic constipation - K59.04    4. History of adenomatous polyp of colon - Z86.010    5. On apixaban therapy - Z79.01    Treatment 1. Esophageal dysphagia      IMAGING: EGD w/ Directed Submucosal Injection(s) (Ordered for 08/02/2023) Notes: Jamesetta Geralds 08/02/2023 09:21:39 AM > Pt scheduled for EGD with Botox on 08/09/2023 with Dr. Marca Ancona at [facility] endo. Patient Annette Stable of Rights, Consent Form, Instructions, Insurance forms and RX for prep given to patient.     Notes: Patient is status post robotic Heller myotomy with Dor fundoplication in 2/23 and now has progressively worsening dysphagia to solids as well as liquids. Will schedule her for EGD with Botox injection.            Referral To:                Reason:Pt scheduled for egd with botox on 08/09/2023 2. Chronic GERD        Start Pantoprazole Sodium Tablet Delayed Release, 40 MG, 1 tablet, Orally, twice a day, 90 days, 180 Tablet, Refills 3     Notes: Will switch patient from omeprazole 20 mg twice a day to pantoprazole 40 mg twice a day, will send prescription for the same for 90 days with 3 refills.   3. Chronic idiopathic constipation        Notes: Advised patient that she may have chronic idiopathic constipation worsened by use of oxycodone. Advised patient to take high-fiber diet, increase intake of whole grains, fruits, vegetables, increase intake of water and drink at least half of body weight in pounds as ounces a day. Patient was given samples of Linzess 72 mcg for 4 days, 145 mcg for 4 days and 290 mcg for 4 days along with Trulance 3 mg daily for 3 days. Patient advised to try each of the medication, maintain a diary to identify which medication works the best for her. Thereafter, advised to call me back in the office so a prescription can be sent for the same.    4. History of adenomatous polyp of colon        Notes: She will be due for surveillance colonoscopy in  07/2024.   5. On apixaban therapy        Notes: Will get an approval from a cardiologist to hold Eliquis for at least 24 hours prior to procedure

## 2023-08-06 NOTE — Progress Notes (Signed)
I WILL, FORWARD THIS TO PRE OP FRO FINAL REVIEW.

## 2023-08-06 NOTE — Progress Notes (Signed)
    This patient is pending EDG and possible esophageal botox with GI on 08/09/23. As a part of pre-procedure risk stratification, patient was scheduled for telehealth visit. On the phone today, patient reported symptoms of shortness of breath and chest tightness worse in a flat position and improved use of additional pillows. She also reports some chest tightness with exertion and intermittent recurrence of palpitations/symptoms of atrial fibrillation. Given these symptoms, I feel that office visit prior to EGD is most appropriate means of evaluating patient for risk stratification. While they may represent GERD/reflux symptoms, difficult to differentiate without physical exam and EKG. Patient verbalized concern with this recommendation given that her procedure is scheduled for 08/09/23 and requested that a message be sent to Dr. Lalla Brothers and Dr. Eldridge Dace for further review of need for appt and/or additional testing prior to her EGD.   I will reach out to the preoperative coverage team to schedule patient for acute office visit.   This note also routed to Dr. Lalla Brothers and Dr. Eldridge Dace for further recommendations.   Perlie Gold, PA-C

## 2023-08-06 NOTE — Telephone Encounter (Signed)
I s/w the pt and informed her she will need to keep her appt 08/09/23 with Any Tillery, PAC. Pt said she thinks the pre op PA misunderstood when she said her chest feels tight when she lays down. Pt said she feels this is her reflux. I explained to the pt that reflux/gerd symptoms can be very similar to cardiac symptoms. In the case of needing to be sure it is in the best interest to have her keep her appt 08/09/23 to try and r/o cardiac issue. Pt is not happy but does understand.

## 2023-08-06 NOTE — Telephone Encounter (Signed)
Per pre op APP today Kathryn [name], PAC pt is needing to be seen in office after tele visit today based on her symptoms. Pt has appt 08/09/23 with Otilio Saber, PAC. Pt will need to keep this appt before she can be cleared.  GI procedure will need to be post poned until the pt has been cleared per pre op APP.   I will update all parties involved.

## 2023-08-08 ENCOUNTER — Encounter (HOSPITAL_COMMUNITY): Payer: Self-pay | Admitting: Anesthesiology

## 2023-08-09 ENCOUNTER — Ambulatory Visit: Payer: Medicare Other | Attending: Student | Admitting: Student

## 2023-08-09 ENCOUNTER — Encounter: Payer: Self-pay | Admitting: Student

## 2023-08-09 ENCOUNTER — Ambulatory Visit (HOSPITAL_COMMUNITY): Admission: RE | Admit: 2023-08-09 | Payer: Medicare Other | Source: Home / Self Care | Admitting: Gastroenterology

## 2023-08-09 VITALS — BP 124/78 | HR 78 | Ht 64.0 in | Wt 204.4 lb

## 2023-08-09 DIAGNOSIS — I480 Paroxysmal atrial fibrillation: Secondary | ICD-10-CM

## 2023-08-09 DIAGNOSIS — Z01818 Encounter for other preprocedural examination: Secondary | ICD-10-CM

## 2023-08-09 SURGERY — ESOPHAGOGASTRODUODENOSCOPY (EGD) WITH PROPOFOL
Anesthesia: Monitor Anesthesia Care

## 2023-08-09 NOTE — Patient Instructions (Signed)
Medication Instructions:  Your physician recommends that you continue on your current medications as directed. Please refer to the Current Medication list given to you today.  *If you need a refill on your cardiac medications before your next appointment, please call your pharmacy*  Lab Work: None ordered If you have labs (blood work) drawn today and your tests are completely normal, you will receive your results only by: MyChart Message (if you have MyChart) OR A paper copy in the mail If you have any lab test that is abnormal or we need to change your treatment, we will call you to review the results.  Follow-Up: At [facility], you and your health needs are our priority.  As part of our continuing mission to provide you with exceptional heart care, we have created designated Provider Care Teams.  These Care Teams include your primary Cardiologist (physician) and Advanced Practice Providers (APPs -  Physician Assistants and Nurse Practitioners) who all work together to provide you with the care you need, when you need it.  Your next appointment:   6 month(s)  Provider:   Steffanie Dunn, MD

## 2023-08-09 NOTE — Progress Notes (Signed)
Electrophysiology Office Note:   Date:  08/09/2023  ID:  CESAR MUSACCHIA, DOB 06/06/1956, MRN [id number]  Primary Cardiologist: Lance Muss, MD Electrophysiologist: Lanier Prude, MD      History of Present Illness:   Kathryn [name] is a 67 y.o. female with h/o symptomatic anemia and AF seen today for routine electrophysiology followup.   Since last being seen in our clinic the patient reports doing OK from a cardiac procedure. She does have palpitations 1-2 times a month. Only last for 20-30 minutes.  Having some mild SOB and orthopnea, but was recently instructed to drink > 2L of water a day to try and help with her GI symptoms, pending endoscopy. Taking meds as ordered without issue.   Review of systems complete and found to be negative unless listed in HPI.   EP Information / Studies Reviewed:    EKG is not ordered today. EKG from 01/28/2023 reviewed which showed NSR  at 66 bpm   Physical Exam:   VS:  BP 124/78   Pulse 78   Ht 5\' 4"  (1.626 m)   Wt 204 lb 6.4 oz (92.7 kg)   LMP  (LMP Unknown)   SpO2 98%   BMI 35.09 kg/m    Wt Readings from Last 3 Encounters:  08/09/23 204 lb 6.4 oz (92.7 kg)  04/08/23 205 lb 0.4 oz (93 kg)  03/29/23 207 lb (93.9 kg)     GEN: Well nourished, well developed in no acute distress NECK: No JVD; No carotid bruits CARDIAC: Regular rate and rhythm, no murmurs, rubs, gallops RESPIRATORY:  Clear to auscultation without rales, wheezing or rhonchi  ABDOMEN: Soft, non-tender, non-distended EXTREMITIES:  No edema; No deformity   ASSESSMENT AND PLAN:    Paroxysmal Atrial Fibrillation  Regular on exam Continue Eliquis 5mg  BID for CHA2DS2VASC of at least 4 Continue Toprol 100 mg daily     She is still interested in ablation -> Watch[facility] but has an EGD and eventually another hip surgery pending  Cardiac clearance for EGD The patient is cleared for surgery from a cardiac perspective with an estimated Low Risk of perioperative cardiac  complications by the Revised Cardiac Risk Index Nedra Hai Criteria). The patient may proceed without further cardiac work up.    Follow up with Dr. Lalla Brothers in 6 months. AF clinic sooner if starts to have more AF or symptoms and need to consider AAD as bridge to ablation.   Signed, Graciella Freer, PA-C

## 2023-08-16 NOTE — Telephone Encounter (Signed)
Patient Name: Kathryn [name]  DOB: 02/10/1956 MRN: [id number]  Primary Cardiologist: Lance Muss, MD  Chart reviewed as part of pre-operative protocol coverage as this was still in staff messages. Pre-op clearance already addressed by colleagues in earlier phone notes. To summarize recommendations:  - Will fax copy of Otilio Saber PA-C's office note from 08/09/23 outlining pre-operative recommendations. His note states, "The patient is cleared for surgery from a cardiac perspective with an estimated Low Risk of perioperative cardiac complications by the Revised Cardiac Risk Index Nedra Hai Criteria). The patient may proceed without further cardiac work up." - Per pharmD review, "Per office protocol, patient can hold Eliquis for 2 days prior to procedure. "  Will route this bundled recommendation to requesting provider via Epic fax function and remove from pre-op pool. Please call with questions.  Laurann Montana, PA-C 08/16/2023, 7:47 AM

## 2023-09-22 DIAGNOSIS — G894 Chronic pain syndrome: Secondary | ICD-10-CM | POA: Diagnosis not present

## 2023-09-22 DIAGNOSIS — F1120 Opioid dependence, uncomplicated: Secondary | ICD-10-CM | POA: Diagnosis not present

## 2023-09-27 DIAGNOSIS — M7989 Other specified soft tissue disorders: Secondary | ICD-10-CM | POA: Diagnosis not present

## 2023-10-06 ENCOUNTER — Other Ambulatory Visit (HOSPITAL_COMMUNITY): Payer: Self-pay | Admitting: Endocrinology

## 2023-10-06 ENCOUNTER — Other Ambulatory Visit (HOSPITAL_COMMUNITY): Payer: Self-pay | Admitting: *Deleted

## 2023-10-06 DIAGNOSIS — M7989 Other specified soft tissue disorders: Secondary | ICD-10-CM

## 2023-10-06 MED ORDER — DILTIAZEM HCL 30 MG PO TABS: ORAL_TABLET | ORAL | 1 refills | Status: DC

## 2023-10-07 ENCOUNTER — Ambulatory Visit (HOSPITAL_COMMUNITY)
Admission: RE | Admit: 2023-10-07 | Discharge: 2023-10-07 | Disposition: A | Discharge status: Home / Self Care | Payer: Medicare Other | Source: Ambulatory Visit | Attending: Endocrinology | Admitting: Endocrinology

## 2023-10-07 DIAGNOSIS — M7989 Other specified soft tissue disorders: Secondary | ICD-10-CM | POA: Insufficient documentation

## 2023-10-07 DIAGNOSIS — R1900 Intra-abdominal and pelvic swelling, mass and lump, unspecified site: Secondary | ICD-10-CM | POA: Diagnosis not present

## 2023-10-15 ENCOUNTER — Other Ambulatory Visit: Payer: Self-pay | Admitting: Gastroenterology

## 2023-11-12 NOTE — Progress Notes (Signed)
PCP -  Cardiologist - Varney Daily, MD LOV clearance for EGD 08-09-23 epic per Maxine Glenn, PA-C EP- Steffanie Dunn, MD  PPM/ICD -  Device Orders -  Rep Notified -   Chest x-ray -  EKG - 01-28-23 epic Stress Test -  ECHO - 01-12-23 epic Cardiac Cath -  CT coronary-02-01-23 epic  Sleep Study -  CPAP -   Fasting Blood Sugar -  Checks Blood Sugar _____ times a day  Blood Thinner Instructions:Eliquis  Hold 2 daysper note in epic 08-16-23 Aspirin Instructions:   COVID vaccine -  Activity-- Anesthesia review:mild aortic valve stenosis, A-fib, HTN   Patient denies shortness of breath, fever, cough and chest pain at PAT appointment   All instructions explained to the patient, with a verbal understanding of the material. Patient agrees to go over the instructions while at home for a better understanding. Patient also instructed to self quarantine after being tested for COVID-19. The opportunity to ask questions was provided.

## 2023-11-15 ENCOUNTER — Other Ambulatory Visit: Payer: Self-pay

## 2023-11-15 ENCOUNTER — Encounter (HOSPITAL_COMMUNITY): Payer: Self-pay | Admitting: Gastroenterology

## 2023-11-22 NOTE — H&P (Signed)
History of Present Illness 67 year old female with trouble swallowing,  history of robotic Heller myotomy with dor fundoplication on 01/09/2022 by Dr. 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 She had hip pain(was told it was bone on bone), she needed a left total hip replacement in 03/2023. Usually, if she lays down she has a lot of mucous, sounds raspy. She feels a knot in her mid chest. She has started to choke on solids and liquids and occasionally pills are hard to swallow and get hung on her throat, she has needed back pats for food to go down. She has lost weight, 30 plus pounds in the 2 years. She has acid reflux and heartburn, she notices it more when she lays on ther right side at night. She has nausea but no vomiting. 8 out of 10 times, she feels she throws up when she has a BM. She has irregular Bms and is constipated, she has 1 BM in 2 weeks. This has been ongoing for all her life, 30-40 years. Stools can be hard and needs to strain occasionally. She took stools softners when she had hip surgery and it helped soften the stools. She has tried linzess in the past with variable results. She has tried miralax, metamucil, benefiber, senna without relief.  Vital Signs Wt: 203.2, Wt change: -9 lbs,  Ht: 66, BMI: 32.79, Temp: 97.5, Pulse sitting: 77, BP sitting: 125/85. Examination GENERAL APPEARANCE:  Well developed, overweight, no active distress, pleasant.  SCLERA:  anicteric.  CARDIOVASCULAR  Normal RRR .  RESPIRATORY  Breath sounds normal. Respiration even and unlabored.  ABDOMEN  No masses palpated. Liver and spleen not palpated, normal. Bowel sounds normal, Abdomen not distended.  EXTREMITIES:  No edema.  NEURO:  alert, oriented to time, place and person, normal gait.  PSYCH:  mood/affect normal.   Assessments 1. Esophageal dysphagia - R13.19 (Primary)    2. Chronic GERD - K21.9    3. Chronic idiopathic constipation - K59.04    4. History of adenomatous polyp of colon - Z86.010    5. On apixaban therapy - Z79.01     Treatment 1.Esophageal dysphagia     EGD w/ Directed Submucosal Injection(s) (Ordered for 08/02/2023)  Pt scheduled for EGD with Botox on at [facility] endo. Patient Annette Stable of Rights, Consent Form, Instructions, Insurance forms and RX for prep given to patient.    Notes: Patient is status post robotic Heller myotomy with Dor fundoplication in 2/23 and now has progressively worsening dysphagia to solids as well as liquids. Will schedule her for EGD with Botox injection.     2.Chronic GERD    Start Pantoprazole Sodium Tablet Delayed Release, 40 MG, 1 tablet, Orally, twice a day, 90 days, 180 Tablet, Refills 3    Notes: Will switch patient from omeprazole 20 mg twice a day to pantoprazole 40 mg twice a day, will send prescription  for the same for 90 days with 3 refills.    3.Chronic idiopathic constipation      Notes: Advised patient that she may have chronic idiopathic constipation worsened by use of oxycodone. Advised patient to take high-fiber diet, increase intake of whole grains, fruits, vegetables, increase intake of water and drink at least half of body weight in pounds as ounces a day. Patient was given samples of Linzess 72 mcg for 4 days, 145 mcg for 4 days and  290 mcg for 4 days along with Trulance 3 mg daily for 3 days. Patient advised to try each of the medication, maintain a diary to identify which medication works the best for her. Thereafter, advised to call me back in the office so a prescription can be sent for the same.    4.History of adenomatous polyp of colon      Notes: She will be due for surveillance colonoscopy in 07/2024.    5.On apixaban therapy    Notes: Will get an approval from a cardiologist to hold Eliquis for at least 24 hours prior to procedure

## 2023-11-23 ENCOUNTER — Encounter (HOSPITAL_COMMUNITY): Payer: Self-pay | Admitting: Gastroenterology

## 2023-11-23 ENCOUNTER — Ambulatory Visit (HOSPITAL_BASED_OUTPATIENT_CLINIC_OR_DEPARTMENT_OTHER): Payer: Medicare Other | Admitting: Certified Registered"

## 2023-11-23 ENCOUNTER — Ambulatory Visit (HOSPITAL_COMMUNITY): Payer: Medicare Other | Admitting: Certified Registered"

## 2023-11-23 ENCOUNTER — Other Ambulatory Visit: Payer: Self-pay

## 2023-11-23 ENCOUNTER — Ambulatory Visit (HOSPITAL_COMMUNITY)
Admission: RE | Admit: 2023-11-23 | Discharge: 2023-11-23 | Disposition: A | Discharge status: Home / Self Care | Payer: Medicare Other | Attending: Gastroenterology | Admitting: Gastroenterology

## 2023-11-23 ENCOUNTER — Encounter (HOSPITAL_COMMUNITY)
Admission: RE | Disposition: A | Discharge status: Home / Self Care | Payer: Self-pay | Source: Home / Self Care | Attending: Gastroenterology

## 2023-11-23 DIAGNOSIS — Z7901 Long term (current) use of anticoagulants: Secondary | ICD-10-CM | POA: Insufficient documentation

## 2023-11-23 DIAGNOSIS — E039 Hypothyroidism, unspecified: Secondary | ICD-10-CM | POA: Insufficient documentation

## 2023-11-23 DIAGNOSIS — R1314 Dysphagia, pharyngoesophageal phase: Secondary | ICD-10-CM | POA: Diagnosis not present

## 2023-11-23 DIAGNOSIS — F32A Depression, unspecified: Secondary | ICD-10-CM | POA: Diagnosis not present

## 2023-11-23 DIAGNOSIS — I4891 Unspecified atrial fibrillation: Secondary | ICD-10-CM

## 2023-11-23 DIAGNOSIS — K5904 Chronic idiopathic constipation: Secondary | ICD-10-CM | POA: Diagnosis not present

## 2023-11-23 DIAGNOSIS — R1310 Dysphagia, unspecified: Secondary | ICD-10-CM | POA: Diagnosis not present

## 2023-11-23 DIAGNOSIS — I10 Essential (primary) hypertension: Secondary | ICD-10-CM | POA: Insufficient documentation

## 2023-11-23 DIAGNOSIS — K219 Gastro-esophageal reflux disease without esophagitis: Secondary | ICD-10-CM | POA: Diagnosis not present

## 2023-11-23 DIAGNOSIS — K449 Diaphragmatic hernia without obstruction or gangrene: Secondary | ICD-10-CM | POA: Insufficient documentation

## 2023-11-23 DIAGNOSIS — K259 Gastric ulcer, unspecified as acute or chronic, without hemorrhage or perforation: Secondary | ICD-10-CM

## 2023-11-23 DIAGNOSIS — D649 Anemia, unspecified: Secondary | ICD-10-CM | POA: Insufficient documentation

## 2023-11-23 DIAGNOSIS — M1990 Unspecified osteoarthritis, unspecified site: Secondary | ICD-10-CM | POA: Insufficient documentation

## 2023-11-23 DIAGNOSIS — E785 Hyperlipidemia, unspecified: Secondary | ICD-10-CM | POA: Diagnosis not present

## 2023-11-23 DIAGNOSIS — Z9889 Other specified postprocedural states: Secondary | ICD-10-CM | POA: Insufficient documentation

## 2023-11-23 DIAGNOSIS — R519 Headache, unspecified: Secondary | ICD-10-CM | POA: Diagnosis not present

## 2023-11-23 DIAGNOSIS — F419 Anxiety disorder, unspecified: Secondary | ICD-10-CM | POA: Insufficient documentation

## 2023-11-23 DIAGNOSIS — Z79899 Other long term (current) drug therapy: Secondary | ICD-10-CM | POA: Diagnosis not present

## 2023-11-23 DIAGNOSIS — Z860101 Personal history of adenomatous and serrated colon polyps: Secondary | ICD-10-CM | POA: Diagnosis not present

## 2023-11-23 DIAGNOSIS — K319 Disease of stomach and duodenum, unspecified: Secondary | ICD-10-CM | POA: Diagnosis not present

## 2023-11-23 HISTORY — PX: ESOPHAGOGASTRODUODENOSCOPY (EGD) WITH PROPOFOL: SHX5813

## 2023-11-23 HISTORY — PX: BOTOX INJECTION: SHX5754

## 2023-11-23 HISTORY — PX: BIOPSY: SHX5522

## 2023-11-23 SURGERY — ESOPHAGOGASTRODUODENOSCOPY (EGD) WITH PROPOFOL
Anesthesia: Monitor Anesthesia Care

## 2023-11-23 MED ORDER — PROPOFOL 500 MG/50ML IV EMUL
INTRAVENOUS | Status: DC | PRN
  Administered 2023-11-23: 150 ug/kg/min via INTRAVENOUS

## 2023-11-23 MED ORDER — PROPOFOL 500 MG/50ML IV EMUL
INTRAVENOUS | Status: AC
  Filled 2023-11-23: qty 50

## 2023-11-23 MED ORDER — PROPOFOL 10 MG/ML IV BOLUS
INTRAVENOUS | Status: AC
  Filled 2023-11-23: qty 20

## 2023-11-23 MED ORDER — ONABOTULINUMTOXINA 100 UNITS IJ SOLR
INTRAMUSCULAR | Status: AC
  Filled 2023-11-23: qty 100

## 2023-11-23 MED ORDER — SODIUM CHLORIDE 0.9 % IV SOLN: INTRAVENOUS | Status: DC

## 2023-11-23 MED ORDER — SODIUM CHLORIDE (PF) 0.9 % IJ SOLN
INTRAMUSCULAR | Status: AC
  Filled 2023-11-23: qty 10

## 2023-11-23 MED ORDER — LIDOCAINE 2% (20 MG/ML) 5 ML SYRINGE
INTRAMUSCULAR | Status: DC | PRN
  Administered 2023-11-23: 100 mg via INTRAVENOUS

## 2023-11-23 MED ORDER — SODIUM CHLORIDE (PF) 0.9 % IJ SOLN
INTRAMUSCULAR | Status: DC | PRN
  Administered 2023-11-23: 4 mL via SUBMUCOSAL

## 2023-11-23 MED ORDER — PROPOFOL 10 MG/ML IV BOLUS
INTRAVENOUS | Status: DC | PRN
  Administered 2023-11-23: 80 mg via INTRAVENOUS
  Administered 2023-11-23: 50 mg via INTRAVENOUS
  Administered 2023-11-23: 40 mg via INTRAVENOUS

## 2023-11-23 SURGICAL SUPPLY — 14 items

## 2023-11-23 NOTE — Discharge Instructions (Signed)

## 2023-11-23 NOTE — Interval H&P Note (Signed)
 History and Physical Interval Note: 67/female with dysphagia for Egd with botox  injection with propofol .   11/23/2023 8:16 AM  Kathryn [name]  has presented today for Egd with botox  injection with propofol , with the diagnosis of esophageal dysphagia.  The various methods of treatment have been discussed with the patient and family. After consideration of risks, benefits and other options for treatment, the patient has consented to  Procedure(s): ESOPHAGOGASTRODUODENOSCOPY (EGD) WITH PROPOFOL  (N/A) BOTOX  INJECTION (N/A) as a surgical intervention.  The patient's history has been reviewed, patient examined, no change in status, stable for surgery.  I have reviewed the patient's chart and labs.  Questions were answered to the patient's satisfaction.     Estelita Manas

## 2023-11-23 NOTE — Progress Notes (Signed)
 Patient in recovery after upper endoscopy with Dr.karki. Tried for 15+ minutes to get an accurate BP on patient. Patient has restless legs and she was unable to hold still for BP. Dr. Tilford made aware of this as well. Patient A&Ox4 and not complaining of pain. Documented BP as best I could.

## 2023-11-23 NOTE — Transfer of Care (Addendum)
 Immediate Anesthesia Transfer of Care Note  Patient: Kathryn [name]  Procedure(s) Performed: ESOPHAGOGASTRODUODENOSCOPY (EGD) WITH PROPOFOL  BOTOX  INJECTION BIOPSY  Patient Location: Endoscopy Unit  Anesthesia Type:MAC  Level of Consciousness: awake and patient cooperative  Airway & Oxygen Therapy: Patient Spontanous Breathing  Post-op Assessment: Report given to RN and Post -op Vital signs reviewed and stable  Post vital signs: Reviewed and stable  Last Vitals:  Vitals Value Taken Time  BP 157/143 11/23/23   0958  Temp 36.7 11/23/23   0958  Pulse 63 11/23/23 0958  Resp 18 11/23/23 0958  SpO2 97 % 11/23/23 0958    Last Pain:  Vitals:   11/23/23 0801  TempSrc: Tympanic  PainSc: 4          Complications: Repeat blood pressure at 1021 was 132/75.

## 2023-11-23 NOTE — Anesthesia Postprocedure Evaluation (Signed)
 Anesthesia Post Note  Patient: Kathryn [name]  Procedure(s) Performed: ESOPHAGOGASTRODUODENOSCOPY (EGD) WITH PROPOFOL  BOTOX  INJECTION BIOPSY     Patient location during evaluation: PACU Anesthesia Type: MAC Level of consciousness: awake and alert Pain management: pain level controlled Vital Signs Assessment: post-procedure vital signs reviewed and stable Respiratory status: spontaneous breathing, nonlabored ventilation, respiratory function stable and patient connected to nasal cannula oxygen Cardiovascular status: stable and blood pressure returned to baseline Postop Assessment: no apparent nausea or vomiting Anesthetic complications: no  No notable events documented.  Last Vitals:  Vitals:   11/23/23 1018 11/23/23 1021  BP: 139/68 132/75  Pulse: 70 64  Resp: 18 20  Temp:    SpO2: 100% 97%    Last Pain:  Vitals:   11/23/23 1021  TempSrc:   PainSc: 0-No pain                 [name]

## 2023-11-23 NOTE — Op Note (Signed)
 [facility] Patient Name: Kathryn [name] Procedure Date: 11/23/2023 MRN: [id number] Attending MD: Estelita Manas , MD, [id number] Date of Birth: January 08, 1956 CSN: [id number] Age: 67 Admit Type: Outpatient Procedure:                Upper GI endoscopy Indications:              Dysphagia Providers:                Estelita Manas, MD, Particia Fischer, RN, Hoy Penner,                            RN, Lorrayne Kitty, Technician Referring MD:             Garnette Ore, MD Medicines:                Monitored Anesthesia Care Complications:            No immediate complications. Estimated blood loss:                            Minimal. Estimated Blood Loss:     Estimated blood loss was minimal. Procedure:                Pre-Anesthesia Assessment:                           - Prior to the procedure, a History and Physical                            was performed, and patient medications and                            allergies were reviewed. The patient's tolerance of                            previous anesthesia was also reviewed. The risks                            and benefits of the procedure and the sedation                            options and risks were discussed with the patient.                            All questions were answered, and informed consent                            was obtained. Prior Anticoagulants: The patient has                            taken Eliquis  (apixaban ), last dose was 3 days                            prior to procedure. ASA Grade Assessment: III - A  patient with severe systemic disease. After                            reviewing the risks and benefits, the patient was                            deemed in satisfactory condition to undergo the                            procedure.                           After obtaining informed consent, the endoscope was                            passed under direct vision. Throughout  the                            procedure, the patient's blood pressure, pulse, and                            oxygen saturations were monitored continuously. The                            [id number]-H190 ([id number]) Olympus endoscope was introduced                            through the mouth, and advanced to the second part                            of duodenum. The upper GI endoscopy was                            accomplished without difficulty. The patient                            tolerated the procedure well. Findings:      No endoscopic abnormality was evident in the esophagus to explain the       patient's complaint of dysphagia.      Area was successfully injected with 100 units botulinum toxin, 25       units/ml, 1 ml each in a four quadrant fashion, 1 cm above GE junction.      Evidence of a fundoplication was found in the gastric fundus. The wrap       appeared intact. This was traversed.      One non-bleeding superficial gastric ulcer with a clean ulcer base       (Forrest Class III) was found at the pylorus. The lesion was 7 mm in       largest dimension. Biopsies were taken with a cold forceps for       Helicobacter pylori testing.      The examined duodenum was normal. Impression:               - No endoscopic esophageal abnormality to explain  patient's dysphagia. Injected with botulinum toxin.                           - A fundoplication was found. The wrap appears                            intact.                           - Non-bleeding gastric ulcer with a clean ulcer                            base (Forrest Class III). Biopsied.                           - Normal examined duodenum. Moderate Sedation:      Patient did not receive moderate sedation for this procedure, but       instead received monitored anesthesia care. Recommendation:           - Resume regular diet.                           - Continue present medications.                            - Await pathology results.                           - Resume Eliquis  (apixaban ) at prior dose tomorrow.                           - PPI BID for 4 weeks and then at least once a day                            indefinitely thereafter. Procedure Code(s):        --- Professional ---                           [phone], Esophagogastroduodenoscopy, flexible,                            transoral; with biopsy, single or multiple                           43236, 59, Esophagogastroduodenoscopy, flexible,                            transoral; with directed submucosal injection(s),                            any substance Diagnosis Code(s):        --- Professional ---                           R13.10, Dysphagia, unspecified  S01.109, Other specified postprocedural states                           K25.9, Gastric ulcer, unspecified as acute or                            chronic, without hemorrhage or perforation CPT copyright 2022 American [employer] Association. All rights reserved. The codes documented in this report are preliminary and upon coder review may  be revised to meet current compliance requirements. Estelita Manas, MD 11/23/2023 9:55:18 AM This report has been signed electronically. Number of Addenda: 0

## 2023-11-23 NOTE — Anesthesia Preprocedure Evaluation (Signed)
 Anesthesia Evaluation  Patient identified by MRN, date of birth, ID band Patient awake    Reviewed: Allergy & Precautions, NPO status , Patient's Chart, lab work & pertinent test results  History of Anesthesia Complications (+) PONV and history of anesthetic complications  Airway Mallampati: I  TM Distance: >3 FB Neck ROM: Full    Dental  (+) Poor Dentition, Missing, Chipped,    Pulmonary    breath sounds clear to auscultation       Cardiovascular hypertension, Pt. on medications and Pt. on home beta blockers + dysrhythmias Atrial Fibrillation  Rhythm:Regular Rate:Normal     Neuro/Psych  Headaches PSYCHIATRIC DISORDERS Anxiety Depression       GI/Hepatic Neg liver ROS, hiatal hernia,GERD  Medicated,,  Endo/Other  Hypothyroidism    Renal/GU negative Renal ROS     Musculoskeletal  (+) Arthritis ,    Abdominal Normal abdominal exam  (+)   Peds  Hematology  (+) Blood dyscrasia, anemia   Anesthesia Other Findings   Reproductive/Obstetrics                             Anesthesia Physical Anesthesia Plan  ASA: 3  Anesthesia Plan: MAC   Post-op Pain Management:    Induction: Intravenous  PONV Risk Score and Plan: 0 and Propofol  infusion  Airway Management Planned: Natural Airway and Nasal Cannula  Additional Equipment: None  Intra-op Plan:   Post-operative Plan:   Informed Consent: I have reviewed the patients History and Physical, chart, labs and discussed the procedure including the risks, benefits and alternatives for the proposed anesthesia with the patient or authorized representative who has indicated his/her understanding and acceptance.       Plan Discussed with: CRNA  Anesthesia Plan Comments:        Anesthesia Quick Evaluation

## 2023-11-23 NOTE — Addendum Note (Signed)
 Addendum  created 11/23/23 1154 by Oletha Cruel, CRNA   Clinical Note Signed

## 2023-11-25 ENCOUNTER — Encounter (HOSPITAL_COMMUNITY): Payer: Self-pay | Admitting: Gastroenterology

## 2023-11-26 LAB — SURGICAL PATHOLOGY

## 2023-12-02 ENCOUNTER — Other Ambulatory Visit: Payer: Self-pay | Admitting: *Deleted

## 2023-12-02 ENCOUNTER — Other Ambulatory Visit (HOSPITAL_COMMUNITY): Payer: Self-pay | Admitting: *Deleted

## 2023-12-02 DIAGNOSIS — I480 Paroxysmal atrial fibrillation: Secondary | ICD-10-CM

## 2023-12-02 MED ORDER — APIXABAN 5 MG PO TABS: 5.0000 mg | ORAL_TABLET | Freq: Every evening | ORAL | 5 refills | Status: DC

## 2023-12-02 MED ORDER — APIXABAN 5 MG PO TABS: 5.0000 mg | ORAL_TABLET | Freq: Two times a day (BID) | ORAL | 5 refills | Status: DC

## 2023-12-02 NOTE — Addendum Note (Signed)
 Addended by: Pamala Hurry B on: 12/02/2023 04:19 PM   Modules accepted: Orders

## 2023-12-02 NOTE — Telephone Encounter (Addendum)
 Eliquis  5mg  refill request received. Patient is 68 years old, weight-90.7kg, Crea-0.89 on 04/09/23, Diagnosis-Afib, and last seen by Jodie Passey on 08/09/23. Dose is appropriate based on dosing criteria. Will send in refill to requested pharmacy.    Initially sent refill for once a day and called the pharmacy to advise to disregard the initial refill sent and that the correct twice a day eliquis  was sent. He states he will only fill the twice a day eliquis  refill.

## 2023-12-03 ENCOUNTER — Telehealth: Payer: Self-pay | Admitting: Cardiology

## 2023-12-03 MED ORDER — APIXABAN 5 MG PO TABS: 5.0000 mg | ORAL_TABLET | Freq: Two times a day (BID) | ORAL | 0 refills | Status: DC

## 2023-12-03 NOTE — Telephone Encounter (Signed)
 Spoke with the patient's husband who states that they are unable to afford Eliquis . Advised that price should go down once they pay and meet their deducible. He states that they are not even able to pay the $440 at this time. Patient took her last dose of Eliquis  this morning. I will provide a box of samples and patient assistance information for them in the meantime.

## 2023-12-03 NOTE — Telephone Encounter (Signed)
 Pt c/o medication issue:  1. Name of Medication:   apixaban  (ELIQUIS ) 5 MG TABS tablet    2. How are you currently taking this medication (dosage and times per day)? As written   3. Are you having a reaction (difficulty breathing--STAT)? No   4. What is your medication issue? Pt spouse called in stating eliquis  went up to $440 and they are unable to afford. He is requesting samples and other options.

## 2023-12-03 NOTE — Telephone Encounter (Signed)
 Pt was returning nurse call and is requesting a callback at [phone]. Please advise

## 2023-12-03 NOTE — Telephone Encounter (Signed)
 Attempted to contact patient's spouse. Unable to leave voicemail.

## 2023-12-07 ENCOUNTER — Telehealth: Payer: Self-pay | Admitting: Cardiology

## 2023-12-07 DIAGNOSIS — Z96642 Presence of left artificial hip joint: Secondary | ICD-10-CM | POA: Diagnosis not present

## 2023-12-07 DIAGNOSIS — M1611 Unilateral primary osteoarthritis, right hip: Secondary | ICD-10-CM | POA: Diagnosis not present

## 2023-12-07 DIAGNOSIS — Z471 Aftercare following joint replacement surgery: Secondary | ICD-10-CM | POA: Diagnosis not present

## 2023-12-07 NOTE — Telephone Encounter (Signed)
   Pre-operative Risk Assessment    Patient Name: Kathryn [name]  DOB: August 21, 1956 MRN: [id number]   Date of last office visit: 08/09/2023 Date of next office visit: 02/10/2024   Request for Surgical Clearance    Procedure:   right total hip arthroplasty  Date of Surgery:  Clearance TBD                                Surgeon:  Dr. Redell Shoals Surgeon's Group or Practice Name:  [facility] Phone number:  [phone] Fax number:  [phone]   Type of Clearance Requested:   - Pharmacy:  Hold Apixaban  (Eliquis )     Type of Anesthesia:  Spinal   Additional requests/questions:    Bonney Jonel LITTIE Smitty   12/07/2023, 3:45 PM

## 2023-12-08 NOTE — Telephone Encounter (Signed)
 Pharmacy please advise on holding apixaban  prior to right hip arthroplasty scheduled for TBD. Thank you.

## 2023-12-08 NOTE — Telephone Encounter (Signed)
 Tried contacting patient no answer left a detailed message to call back

## 2023-12-08 NOTE — Telephone Encounter (Signed)
 Patient with diagnosis of atrial fibrillation on Eliquis  for anticoagulation.    Procedure:   right total hip arthroplasty   Date of Surgery:  Clearance TBD    CHA2DS2-VASc Score = 4   This indicates a 4.8% annual risk of stroke. The patient's score is based upon: CHF History: 0 HTN History: 1 Diabetes History: 0 Stroke History: 0 Vascular Disease History: 1 Age Score: 1 Gender Score: 1    CrCl 85 Platelet count 252  Per office protocol, patient can hold Eliquis  for 3 days prior to procedure.   Patient will not need bridging with Lovenox  (enoxaparin ) around procedure.  **This guidance is not considered finalized until pre-operative APP has relayed final recommendations.**

## 2023-12-08 NOTE — Telephone Encounter (Signed)
   Name: Kathryn [name]  DOB: 05/11/1956  MRN: [id number]  Primary Cardiologist: Avery Bodo, MD Last seen 08/06/2019 for  Preoperative team, please contact this patient and set up a phone call appointment for further preoperative risk assessment. Please obtain consent and complete medication review. Thank you for your help.  I confirm that guidance regarding antiplatelet and oral anticoagulation therapy has been completed and, if necessary, noted below.  Per office protocol, patient can hold Eliquis  for 3 days prior to procedure.   Patient will not need bridging with Lovenox  (enoxaparin ) around procedure.  I also confirmed the patient resides in the state of [place] . As per [facility] Medical Board telemedicine laws, the patient must reside in the state in which the provider is licensed.   Friddie Jetty, NP 12/08/2023, 4:29 PM [facility]

## 2023-12-10 NOTE — Telephone Encounter (Signed)
Called patient to set up appt for pre-op, no answer left a vm

## 2023-12-13 NOTE — Telephone Encounter (Signed)
Called patient to set up appt for pre-op, no answer left a vm

## 2023-12-15 ENCOUNTER — Telehealth: Payer: Self-pay | Admitting: *Deleted

## 2023-12-15 NOTE — Telephone Encounter (Signed)
Pt has been scheduled 12/28/22. Med rec and consent are done.

## 2023-12-15 NOTE — Telephone Encounter (Signed)
Our office has attempted to reach pt x 4 to schedule a tele preop appt. Will remove from the call back pool fro preop until pt call's back to schedule tele pre op appt.   I will update the surgeon office as FYI.

## 2023-12-15 NOTE — Telephone Encounter (Signed)
Pt returning call

## 2023-12-15 NOTE — Telephone Encounter (Signed)
Pt has been scheduled 12/28/22. Med rec and consent are done.     Patient Consent for Virtual Visit        Kathryn [name] has provided verbal consent on 12/15/2023 for a virtual visit (video or telephone).   CONSENT FOR VIRTUAL VISIT FOR:  Kathryn [name]  By participating in this virtual visit I agree to the following:  I hereby voluntarily request, consent and authorize [facility] and its employed or contracted physicians, physician assistants, nurse practitioners or other licensed health care professionals (the Practitioner), to provide me with telemedicine health care services (the "Services") as deemed necessary by the treating Practitioner. I acknowledge and consent to receive the Services by the Practitioner via telemedicine. I understand that the telemedicine visit will involve communicating with the Practitioner through live audiovisual communication technology and the disclosure of certain medical information by electronic transmission. I acknowledge that I have been given the opportunity to request an in-person assessment or other available alternative prior to the telemedicine visit and am voluntarily participating in the telemedicine visit.  I understand that I have the right to withhold or withdraw my consent to the use of telemedicine in the course of my care at any time, without affecting my right to future care or treatment, and that the Practitioner or I may terminate the telemedicine visit at any time. I understand that I have the right to inspect all information obtained and/or recorded in the course of the telemedicine visit and may receive copies of available information for a reasonable fee.  I understand that some of the potential risks of receiving the Services via telemedicine include:  Delay or interruption in medical evaluation due to technological equipment failure or disruption; Information transmitted may not be sufficient (e.g. poor resolution of images)  to allow for appropriate medical decision making by the Practitioner; and/or  In rare instances, security protocols could fail, causing a breach of personal health information.  Furthermore, I acknowledge that it is my responsibility to provide information about my medical history, conditions and care that is complete and accurate to the best of my ability. I acknowledge that Practitioner's advice, recommendations, and/or decision may be based on factors not within their control, such as incomplete or inaccurate data provided by me or distortions of diagnostic images or specimens that may result from electronic transmissions. I understand that the practice of medicine is not an exact science and that Practitioner makes no warranties or guarantees regarding treatment outcomes. I acknowledge that a copy of this consent can be made available to me via my patient portal [facility] MyChart), or I can request a printed copy by calling the office of [facility].    I understand that my insurance will be billed for this visit.   I have read or had this consent read to me. I understand the contents of this consent, which adequately explains the benefits and risks of the Services being provided via telemedicine.  I have been provided ample opportunity to ask questions regarding this consent and the Services and have had my questions answered to my satisfaction. I give my informed consent for the services to be provided through the use of telemedicine in my medical care

## 2023-12-27 DIAGNOSIS — D171 Benign lipomatous neoplasm of skin and subcutaneous tissue of trunk: Secondary | ICD-10-CM | POA: Diagnosis not present

## 2023-12-28 DIAGNOSIS — G894 Chronic pain syndrome: Secondary | ICD-10-CM | POA: Diagnosis not present

## 2023-12-28 DIAGNOSIS — F1120 Opioid dependence, uncomplicated: Secondary | ICD-10-CM | POA: Diagnosis not present

## 2023-12-29 ENCOUNTER — Ambulatory Visit: Payer: Medicare Other | Attending: Cardiology | Admitting: Cardiology

## 2023-12-29 DIAGNOSIS — Z01810 Encounter for preprocedural cardiovascular examination: Secondary | ICD-10-CM

## 2023-12-29 DIAGNOSIS — Z01818 Encounter for other preprocedural examination: Secondary | ICD-10-CM

## 2023-12-29 NOTE — Progress Notes (Signed)
 Virtual Visit via Telephone Note   Because of Kathryn [name]'s co-morbid illnesses, she is at least at moderate risk for complications without adequate follow up.  This format is felt to be most appropriate for this patient at this time.  The patient did not have access to video technology/had technical difficulties with video requiring transitioning to audio format only (telephone).  All issues noted in this document were discussed and addressed.  No physical exam could be performed with this format.  Please refer to the patient's chart for her consent to telehealth for [facility].  Evaluation Performed:  Preoperative cardiovascular risk assessment _____________   Date:  12/29/2023   Patient ID:  Kathryn [name], DOB [birth year], MRN [id number] Patient Location:  Home Provider location:   Office  Primary Care Provider:  Nichole Senior, MD Primary Cardiologist:  Candyce Reek, MD  Chief Complaint / Patient Profile   68 y.o. y/o female with a h/o paroxysmal atrial fibrillation, aortic atherosclerosis, hypertension, hypothyroidism, hyperlipidemia who is pending right total hip arthroplasty with Dr. Redell Shoals and presents today for telephonic preoperative cardiovascular risk assessment.  History of Present Illness    Kathryn [name] is a 68 y.o. female who presents via audio/video conferencing for a telehealth visit today.  Pt was last seen in cardiology clinic on 08/09/23 by Ozell Passey, PA.  At that time Kathryn [name] was doing well.  The patient is now pending procedure as outlined above. Since her last visit, she has remained stable from a cardiac standpoint.  Today she denies chest pain, shortness of breath, lower extremity edema, fatigue, palpitations, melena, hematuria, hemoptysis, diaphoresis, weakness, presyncope, syncope, orthopnea, and PND.   Past Medical History    Past Medical History:  Diagnosis Date   Anemia    Anxiety    Breast cancer (HCC)     Right breast, had lumpectomy only no chemo, radiation   Cancer (HCC) 10/2016   Breast right,  lumpectomy,  did not require chemo/xrt   DDD (degenerative disc disease), cervical    Depression    Dyspnea    Dysrhythmia    A Fib   Elevated cholesterol    GERD (gastroesophageal reflux disease)    H/O hiatal hernia    Headache    Hypertension    Hypothyroidism    Paroxysmal atrial fibrillation (HCC) 11/20/2020   PONV (postoperative nausea and vomiting)    Tremor    in hands and down left leg   Wears partial dentures    upper   Past Surgical History:  Procedure Laterality Date   ABDOMINAL HYSTERECTOMY  yrs ago   ANTERIOR LAT LUMBAR FUSION N/A 11/21/2019   Procedure: Lumbar two-three Lumbar three-four Anterolateral lumbar interbody fusion with lateral plate;  Surgeon: Colon Shove, MD;  Location: [facility];  Service: Neurosurgery;  Laterality: N/A;   BIOPSY  11/23/2023   Procedure: BIOPSY;  Surgeon: Saintclair Jasper, MD;  Location: [facility];  Service: Gastroenterology;;   BOTOX  INJECTION N/A 11/23/2023   Procedure: BOTOX  INJECTION;  Surgeon: Saintclair Jasper, MD;  Location: [facility];  Service: Gastroenterology;  Laterality: N/A;   BREAST EXCISIONAL BIOPSY Right 11/2016   ALH and papillomas   BREAST LUMPECTOMY Right 2017   BREAST LUMPECTOMY WITH RADIOACTIVE SEED LOCALIZATION Right 12/15/2016   Procedure: RADIOACTIVE SEED X 2 GUIDED EXCISIONAL BREAST BIOPSY;  Surgeon: Donnice Bury, MD;  Location: [facility];  Service: General;  Laterality: Right;   CARPAL TUNNEL RELEASE Bilateral yrs ago  x 2   cervical neck fusion  yrs ago   C 3 and C 4    COLONOSCOPY     ESOPHAGOGASTRODUODENOSCOPY (EGD) WITH PROPOFOL  N/A 11/23/2023   Procedure: ESOPHAGOGASTRODUODENOSCOPY (EGD) WITH PROPOFOL ;  Surgeon: Saintclair Jasper, MD;  Location: [facility];  Service: Gastroenterology;  Laterality: N/A;   ESOPHAGOGASTRODUODENOSCOPY ENDOSCOPY N/A 01/09/2022   Procedure: INTRAOPERATIVE ENDOSCOPY;   Surgeon: Rubin Calamity, MD;  Location: [facility] OR;  Service: General;  Laterality: N/A;   EYE SURGERY Bilateral    Cataracts removed   left knee arthroscopy   last done 2008   x 3   left shoulder arthroscopy  yrs ago   lower back surgery  2005   right total knee replacement   2008   TOTAL HIP ARTHROPLASTY Left 04/08/2023   Procedure: TOTAL HIP ARTHROPLASTY ANTERIOR APPROACH;  Surgeon: Fidel Rogue, MD;  Location: [facility];  Service: Orthopedics;  Laterality: Left;  150   TOTAL KNEE ARTHROPLASTY  01/14/2012   Procedure: TOTAL KNEE ARTHROPLASTY;  Surgeon: Reyes JAYSON Billing, MD;  Location: [facility];  Service: Orthopedics;  Laterality: Left;  preop femoral nerve block   UPPER GI ENDOSCOPY     WISDOM TOOTH EXTRACTION     Allergies No Known Allergies Home Medications    Prior to Admission medications   Medication Sig Start Date End Date Taking? Authorizing Provider  apixaban  (ELIQUIS ) 5 MG TABS tablet Take 1 tablet (5 mg total) by mouth 2 (two) times daily. 12/02/23   Cindie Ole DASEN, MD  apixaban  (ELIQUIS ) 5 MG TABS tablet Take 1 tablet (5 mg total) by mouth 2 (two) times daily. 12/03/23   Cindie Ole DASEN, MD  Camphor-Menthol -Methyl Sal (SALONPAS) 3.11-28-08 % PTCH Place 1 patch onto the skin daily as needed (left hip pain).    [provider]  colchicine  0.6 MG tablet Take 0.6 mg by mouth as needed (gout flare). 09/04/21   [provider]  diltiazem  (CARDIZEM ) 30 MG tablet Take 1 tablet every 4 hours AS NEEDED for heart rate >100 as long as blood pressure >100. 10/06/23   Terra Fairy PARAS, PA-C  DULoxetine  (CYMBALTA ) 30 MG capsule Take 30 mg by mouth daily. 09/08/22   [provider]  DULoxetine  (CYMBALTA ) 60 MG capsule Take 60 mg by mouth See admin instructions.    [provider]  furosemide  (LASIX ) 20 MG tablet TAKE 1 TABLET BY MOUTH DAILY AS NEEDED 02/17/23   Cindie Ole DASEN, MD  gabapentin  (NEURONTIN ) 600 MG tablet Take 600 mg by mouth 3 (three) times  daily. 08/03/22   [provider]  hydrochlorothiazide  (HYDRODIURIL ) 25 MG tablet TAKE 1 TABLET BY MOUTH DAILY 05/18/23   Dann Candyce RAMAN, MD  levothyroxine  (SYNTHROID ) 200 MCG tablet Take 200 mcg by mouth daily before breakfast.    [provider]  methocarbamol  (ROBAXIN ) 500 MG tablet Take 1 tablet (500 mg total) by mouth every 6 (six) hours as needed for muscle spasms. 04/09/23   Hill, Valery RAMAN, PA-C  metoprolol  succinate (TOPROL -XL) 100 MG 24 hr tablet TAKE 1 TABLET BY MOUTH DAILY WITH OR IMMEDIATELY FOLLOWING A MEAL 03/02/23   Cindie Ole DASEN, MD  omeprazole  (PRILOSEC ) 20 MG capsule Take 20 mg by mouth 2 (two) times daily. 02/20/22   [provider]  ondansetron  (ZOFRAN ) 4 MG tablet Take 1 tablet (4 mg total) by mouth every 8 (eight) hours as needed for nausea or vomiting. 04/09/23 04/08/24  Leigh Valery RAMAN, PA-C   Physical Exam    Vital  Signs:  Kathryn [name] does not have vital signs available for review today.  Given telephonic nature of communication, physical exam is limited. AAOx3. NAD. Normal affect.  Speech and respirations are unlabored.  Accessory Clinical Findings   None Assessment & Plan    1.  Preoperative Cardiovascular Risk Assessment: Right total hip arthroplasty with Dr. Redell Shoals   Kathryn [name]'s perioperative risk of a major cardiac event is 0.4% according to the Revised Cardiac Risk Index (RCRI).  Therefore, she is at low risk for perioperative complications.   Her functional capacity is good at 6.61 METs according to the Duke Activity Status Index (DASI). Recommendations: According to ACC/AHA guidelines, no further cardiovascular testing needed.  The patient may proceed to surgery at acceptable risk.   Antiplatelet and/or Anticoagulation Recommendations: Eliquis  (Apixaban ) can be held for 3 days prior to surgery.  Please resume post op when felt to be safe.  Patient will not need bridging with Lovenox  around procedure.   The patient  was advised that if she develops new symptoms prior to surgery to contact our office to arrange for a follow-up visit, and she verbalized understanding.  A copy of this note will be routed to requesting surgeon.  Time:   Today, I have spent 10 minutes with the patient with telehealth technology discussing medical history, symptoms, and management plan.    Arnetra Terris D Pearl Bents, NP  12/29/2023, 11:40 AM

## 2024-01-24 ENCOUNTER — Telehealth: Payer: Self-pay | Admitting: Cardiology

## 2024-01-24 MED ORDER — APIXABAN 5 MG PO TABS: 5.0000 mg | ORAL_TABLET | Freq: Two times a day (BID) | ORAL | 0 refills | Status: DC

## 2024-01-24 NOTE — Telephone Encounter (Signed)
Patient calling the office for samples of medication:   1.  What medication and dosage are you requesting samples for?  apixaban (ELIQUIS) 5 MG TABS tablet  2.  Are you currently out of this medication?  yes

## 2024-01-24 NOTE — Telephone Encounter (Signed)
 Spoke with the patient and advised that I will leave Eliquis samples for her to pick up tomorrow. Advised her to contact her insurance company about setting up a payment plan for her deductible. Also advised her to check on applying for patient assistance. Information was provided for her when she last came and picked up samples however she never followed through with applying. Patient is aware that we cannot continue to provide samples.

## 2024-02-01 DIAGNOSIS — E538 Deficiency of other specified B group vitamins: Secondary | ICD-10-CM | POA: Diagnosis not present

## 2024-02-01 DIAGNOSIS — E039 Hypothyroidism, unspecified: Secondary | ICD-10-CM | POA: Diagnosis not present

## 2024-02-01 DIAGNOSIS — E785 Hyperlipidemia, unspecified: Secondary | ICD-10-CM | POA: Diagnosis not present

## 2024-02-01 DIAGNOSIS — I480 Paroxysmal atrial fibrillation: Secondary | ICD-10-CM | POA: Diagnosis not present

## 2024-02-01 DIAGNOSIS — I119 Hypertensive heart disease without heart failure: Secondary | ICD-10-CM | POA: Diagnosis not present

## 2024-02-01 DIAGNOSIS — D509 Iron deficiency anemia, unspecified: Secondary | ICD-10-CM | POA: Diagnosis not present

## 2024-02-09 NOTE — Progress Notes (Unsigned)
  Electrophysiology Office Follow up Visit Note:    Date:  02/10/2024   ID:  Kathryn [name], DOB [birth year], MRN [id number]  PCP:  Adrian Prince, MD  [facility] Cardiologist:  Lance Muss, MD  [facility] Electrophysiologist:  Lanier Prude, MD    Interval History:     Kathryn [name] is a 68 y.o. female who presents for a follow up visit.   I last saw the patient February 10, 2023.  I am seeing her to discuss her atrial fibrillation.  At the last appointment she was doing well.  She is on Eliquis for stroke prophylaxis but has a history of anemia.  We have discussed atrial fibrillation ablation and left atrial appendage occlusion to help manage both the arrhythmia and her stroke risk.  She presents today for follow-up.  Low burden of AF. 2x per month. Uses as needed meds and resolves after about 15 minutes.is undergoing workup for hip replacement.       Past medical, surgical, social and family history were reviewed.  ROS:   Please see the history of present illness.    All other systems reviewed and are negative.  EKGs/Labs/Other Studies Reviewed:    The following studies were reviewed today:  11/20/2020 ECG shows AF w/ RVR        Physical Exam:    VS:  BP 122/80 (BP Location: Left Arm, Patient Position: Sitting, Cuff Size: Large)   Pulse 80   Ht 5\' 4"  (1.626 m)   Wt 197 lb (89.4 kg)   LMP  (LMP Unknown)   SpO2 97%   BMI 33.81 kg/m     Wt Readings from Last 3 Encounters:  02/10/24 197 lb (89.4 kg)  11/23/23 199 lb 15.3 oz (90.7 kg)  08/09/23 204 lb 6.4 oz (92.7 kg)     GEN: no distress CARD: RRR, No MRG RESP: No IWOB. CTAB.      ASSESSMENT:    1. PAF (paroxysmal atrial fibrillation) (HCC)   2. Anemia, unspecified type    PLAN:    In order of problems listed above:  #Atrial fibrillation #Anemia The patient continues to have episodes of symptomatic atrial fibrillation occurring at least 2 times per month.  She manages these  episodes by taking as needed diltiazem.  She is interested in continue with the current medical therapy.  She is on Eliquis for stroke prophylaxis.  #Preop restratification The patient is at acceptable risk to undergo orthopedic surgery.  She is okay to hold her Eliquis for 2 to 3 days prior to the surgery and restart when felt safe from a postop surgical perspective.  The risks of stopping anticoagulation in the perioperative period have been discussed with the patient during today's clinic appointment and she is interested in proceeding with this strategy.  Follow-up 1 year with EP APP.   Signed, Steffanie Dunn, MD, [facility], [facility] 02/10/2024 12:21 PM    Electrophysiology [facility]

## 2024-02-10 ENCOUNTER — Encounter: Payer: Self-pay | Admitting: Cardiology

## 2024-02-10 ENCOUNTER — Ambulatory Visit: Payer: Medicare Other | Attending: Cardiology | Admitting: Cardiology

## 2024-02-10 VITALS — BP 122/80 | HR 80 | Ht 64.0 in | Wt 197.0 lb

## 2024-02-10 DIAGNOSIS — I480 Paroxysmal atrial fibrillation: Secondary | ICD-10-CM

## 2024-02-10 DIAGNOSIS — D649 Anemia, unspecified: Secondary | ICD-10-CM

## 2024-02-10 NOTE — Patient Instructions (Addendum)
 Medication Instructions:  Your physician recommends that you continue on your current medications as directed. Please refer to the Current Medication list given to you today.  *If you need a refill on your cardiac medications before your next appointment, please call your pharmacy*  Follow-Up: At [facility], you and your health needs are our priority.  As part of our continuing mission to provide you with exceptional heart care, we have created designated Provider Care Teams.  These Care Teams include your primary Cardiologist (physician) and Advanced Practice Providers (APPs -  Physician Assistants and Nurse Practitioners) who all work together to provide you with the care you need, when you need it.   Your next appointment:   1 year with EP APP

## 2024-03-03 ENCOUNTER — Other Ambulatory Visit: Payer: Self-pay | Admitting: Cardiology

## 2024-03-08 NOTE — Progress Notes (Signed)
 Surgery orders requested via Epic inbox.

## 2024-03-09 ENCOUNTER — Ambulatory Visit: Payer: Self-pay | Admitting: Student

## 2024-03-09 NOTE — H&P (Signed)
 TOTAL HIP ADMISSION H&P  Patient is admitted for right total hip arthroplasty.  Subjective:  Chief Complaint: right hip pain  HPI: Kathryn [name], 68 y.o. female, has a history of pain and functional disability in the right hip(s) due to arthritis and patient has failed non-surgical conservative treatments for greater than 12 weeks to include NSAID's and/or analgesics, flexibility and strengthening excercises, use of assistive devices, weight reduction as appropriate, and activity modification.  Onset of symptoms was gradual starting >10 years ago with rapidlly worsening course since that time.The patient noted no past surgery on the right hip(s).  Patient currently rates pain in the right hip at 10 out of 10 with activity. Patient has night pain, worsening of pain with activity and weight bearing, trendelenberg gait, pain that interfers with activities of daily living, and pain with passive range of motion. Patient has evidence of subchondral cysts, subchondral sclerosis, periarticular osteophytes, and joint space narrowing by imaging studies. This condition presents safety issues increasing the risk of falls.  There is no current active infection.  Patient Active Problem List   Diagnosis Date Noted   Primary osteoarthritis of left hip 04/08/2023   Aortic atherosclerosis (HCC) 04/09/2022   Esophageal achalasia 01/09/2022   Chronic left-sided lumbar radiculopathy 03/27/2021   Lumbar radiculopathy, chronic 11/21/2019   Spondylolisthesis of lumbar region 07/04/2019   Spondylolisthesis at L4-L5 level 07/04/2019   Atypical lobular hyperplasia [facility]) of right breast 02/09/2017   Osteoarthritis of knee 01/15/2012   HYPOTHYROIDISM 07/26/2008   HYPERLIPIDEMIA 07/26/2008   HYPERTENSION 07/26/2008   DYSPNEA 07/26/2008   CARPAL TUNNEL SYNDROME, HX OF 07/26/2008   Past Medical History:  Diagnosis Date   Anemia    Anxiety    Breast cancer (HCC)    Right breast, had lumpectomy only no chemo,  radiation   Cancer (HCC) 10/2016   Breast right,  lumpectomy,  did not require chemo/xrt   DDD (degenerative disc disease), cervical    Depression    Dyspnea    Dysrhythmia    A Fib   Elevated cholesterol    GERD (gastroesophageal reflux disease)    H/O hiatal hernia    Headache    Hypertension    Hypothyroidism    Paroxysmal atrial fibrillation (HCC) 11/20/2020   PONV (postoperative nausea and vomiting)    Tremor    in hands and down left leg   Wears partial dentures    upper    Past Surgical History:  Procedure Laterality Date   ABDOMINAL HYSTERECTOMY  yrs ago   ANTERIOR LAT LUMBAR FUSION N/A 11/21/2019   Procedure: Lumbar two-three Lumbar three-four Anterolateral lumbar interbody fusion with lateral plate;  Surgeon: Elna Haggis, MD;  Location: [facility] OR;  Service: Neurosurgery;  Laterality: N/A;   BIOPSY  11/23/2023   Procedure: BIOPSY;  Surgeon: Genell Ken, MD;  Location: [facility];  Service: Gastroenterology;;   BOTOX  INJECTION N/A 11/23/2023   Procedure: BOTOX  INJECTION;  Surgeon: Genell Ken, MD;  Location: [facility];  Service: Gastroenterology;  Laterality: N/A;   BREAST EXCISIONAL BIOPSY Right 11/2016   ALH and papillomas   BREAST LUMPECTOMY Right 2017   BREAST LUMPECTOMY WITH RADIOACTIVE SEED LOCALIZATION Right 12/15/2016   Procedure: RADIOACTIVE SEED X 2 GUIDED EXCISIONAL BREAST BIOPSY;  Surgeon: Enid Harry, MD;  Location: [facility];  Service: General;  Laterality: Right;   CARPAL TUNNEL RELEASE Bilateral yrs ago   x 2   cervical neck fusion  yrs ago   C 3 and C 4  COLONOSCOPY     ESOPHAGOGASTRODUODENOSCOPY (EGD) WITH PROPOFOL  N/A 11/23/2023   Procedure: ESOPHAGOGASTRODUODENOSCOPY (EGD) WITH PROPOFOL ;  Surgeon: Genell Ken, MD;  Location: [facility];  Service: Gastroenterology;  Laterality: N/A;   ESOPHAGOGASTRODUODENOSCOPY ENDOSCOPY N/A 01/09/2022   Procedure: INTRAOPERATIVE ENDOSCOPY;  Surgeon: Shela Derby, MD;  Location: [facility]  OR;  Service: General;  Laterality: N/A;   EYE SURGERY Bilateral    Cataracts removed   left knee arthroscopy   last done 2008   x 3   left shoulder arthroscopy  yrs ago   lower back surgery  2005   right total knee replacement   2008   TOTAL HIP ARTHROPLASTY Left 04/08/2023   Procedure: TOTAL HIP ARTHROPLASTY ANTERIOR APPROACH;  Surgeon: Adonica Hoose, MD;  Location: [facility];  Service: Orthopedics;  Laterality: Left;  150   TOTAL KNEE ARTHROPLASTY  01/14/2012   Procedure: TOTAL KNEE ARTHROPLASTY;  Surgeon: Loel Ring, MD;  Location: [facility];  Service: Orthopedics;  Laterality: Left;  preop femoral nerve block   UPPER GI ENDOSCOPY     WISDOM TOOTH EXTRACTION      Current Outpatient Medications  Medication Sig Dispense Refill Last Dose/Taking   apixaban  (ELIQUIS ) 5 MG TABS tablet Take 1 tablet (5 mg total) by mouth 2 (two) times daily. 60 tablet 5    apixaban  (ELIQUIS ) 5 MG TABS tablet Take 1 tablet (5 mg total) by mouth 2 (two) times daily. 28 tablet 0    Camphor-Menthol -Methyl Sal (SALONPAS) 3.11-28-08 % PTCH Place 1 patch onto the skin daily as needed (left hip pain).      colchicine  0.6 MG tablet Take 0.6 mg by mouth as needed (gout flare).      diltiazem  (CARDIZEM ) 30 MG tablet Take 1 tablet every 4 hours AS NEEDED for heart rate >100 as long as blood pressure >100. 30 tablet 1    DULoxetine  (CYMBALTA ) 30 MG capsule Take 30 mg by mouth daily.      DULoxetine  (CYMBALTA ) 60 MG capsule Take 60 mg by mouth See admin instructions.      furosemide  (LASIX ) 20 MG tablet TAKE 1 TABLET BY MOUTH DAILY AS NEEDED 30 tablet 11    gabapentin  (NEURONTIN ) 600 MG tablet Take 600 mg by mouth 3 (three) times daily.      hydrochlorothiazide  (HYDRODIURIL ) 25 MG tablet TAKE 1 TABLET BY MOUTH DAILY 30 tablet 11    levothyroxine  (SYNTHROID ) 200 MCG tablet Take 200 mcg by mouth daily before breakfast.      methocarbamol  (ROBAXIN ) 500 MG tablet Take 1 tablet (500 mg total) by mouth every 6 (six) hours as  needed for muscle spasms. 20 tablet 0    metoprolol  succinate (TOPROL -XL) 100 MG 24 hr tablet TAKE 1 TABLET BY MOUTH DAILY WITH OR IMMEDIATLY FOLLOWING A MEAL 90 tablet 3    omeprazole  (PRILOSEC ) 20 MG capsule Take 20 mg by mouth 2 (two) times daily.      ondansetron  (ZOFRAN ) 4 MG tablet Take 1 tablet (4 mg total) by mouth every 8 (eight) hours as needed for nausea or vomiting. 30 tablet 0    oxyCODONE -acetaminophen  (PERCOCET) 7.5-325 MG tablet Take 1 tablet by mouth every 8 (eight) hours as needed.      pantoprazole  (PROTONIX ) 40 MG tablet Take 40 mg by mouth 2 (two) times daily.      WELLBUTRIN XL 150 MG 24 hr tablet Take 150 mg by mouth daily.      No current facility-administered medications for this visit.   No Known  Allergies  Social History   Tobacco Use   Smoking status: Never   Smokeless tobacco: Never  Substance Use Topics   Alcohol  use: Not Currently    Comment: occasional wine    Family History  Problem Relation Age of Onset   Heart disease Father    Stroke Brother    Breast cancer Neg Hx      Review of Systems  Musculoskeletal:  Positive for arthralgias and gait problem.  All other systems reviewed and are negative.   Objective:  Physical Exam Constitutional:      Appearance: Normal appearance.  HENT:     Head: Normocephalic and atraumatic.     Nose: Nose normal.     Mouth/Throat:     Mouth: Mucous membranes are moist.     Pharynx: Oropharynx is clear.  Eyes:     Conjunctiva/sclera: Conjunctivae normal.  Cardiovascular:     Rate and Rhythm: Normal rate and regular rhythm.     Pulses: Normal pulses.     Heart sounds: Normal heart sounds.  Pulmonary:     Effort: Pulmonary effort is normal.     Breath sounds: Normal breath sounds.  Abdominal:     General: Abdomen is flat.     Palpations: Abdomen is soft.  Genitourinary:    Comments: Deferred. Musculoskeletal:     Cervical back: Normal range of motion and neck supple.     Comments: Examination of  the right hip reveals no skin wounds or lesions. Pain with flexion and rotation of the hip. Motion restriction noted. Trochanteric tenderness to palpation.   Examination of the left hip reveals a well healed incision. No drainage or erythema. No wound dehiscence. Small subcutaneous fluid collection. Painless log rolling of the hip. Mild trochanteric tenderness. Hip abductor strength 4/5.   Sensory and motor function intact in LE bilaterally. Distal pedal pulses 2+ bilaterally.  No significant pedal edema. Calves soft and non-tender.   Skin:    General: Skin is warm and dry.     Capillary Refill: Capillary refill takes less than 2 seconds.  Neurological:     General: No focal deficit present.     Mental Status: She is alert and oriented to person, place, and time.  Psychiatric:        Mood and Affect: Mood normal.        Behavior: Behavior normal.        Thought Content: Thought content normal.        Judgment: Judgment normal.     Vital signs in last 24 hours: @VSRANGES @  Labs:   Estimated body mass index is 33.81 kg/m as calculated from the following:   Height as of 02/10/24: 5\' 4"  (1.626 m).   Weight as of 02/10/24: 89.4 kg.   Imaging Review Plain radiographs demonstrate severe degenerative joint disease of the right hip(s). The bone quality appears to be adequate for age and reported activity level.      Assessment/Plan:  End stage arthritis, right hip(s)  The patient history, physical examination, clinical judgement of the provider and imaging studies are consistent with end stage degenerative joint disease of the right hip(s) and total hip arthroplasty is deemed medically necessary. The treatment options including medical management, injection therapy, arthroscopy and arthroplasty were discussed at length. The risks and benefits of total hip arthroplasty were presented and reviewed. The risks due to aseptic loosening, infection, stiffness, dislocation/subluxation,   thromboembolic complications and other imponderables were discussed.  The patient acknowledged the explanation, agreed  to proceed with the plan and consent was signed. Patient is being admitted for inpatient treatment for surgery, pain control, PT, OT, prophylactic antibiotics, VTE prophylaxis, progressive ambulation and ADL's and discharge planning.The patient is planning to be discharged home with HEP after an overnight stay.   Therapy Plans: HEP.  Disposition: Home with husband and daughter Planned DVT Prophylaxis: Eliquis  5mg  BID DME needed: Has rolling walker and cane.  PCP: Cleared.  Cardiology: Cleared. Hold Eliquis  3 days prior to surgery.  TXA: IV Allergies: NDKA.  Anesthesia Concerns: Sometimes nauseated. Did not have issues last surgery. None.  BMI: 35.1 Last HgbA1c: Not diabetic Other: - A-fib, Eliquis , Need to stop 3 days prior to surgery date.  - Chronic low back pain. Oxycodone  7.5mg  3-4 times per day, gabapentin  600mg  TID, methocarbamol  BID/TID. Pain management [facility].  - No NSAIDS.  - Oxycodone  7.5mg , zofran .  - Labs pending. Discussed hemoglobin has to be high enough for surgery.  - 03/15/24: Hgb 9.5 and too low for surgery. Surgery canceled until hemoglobin optimized, follow-up with PCP.      Patient's anticipated LOS is less than 2 midnights, meeting these requirements: - Younger than 7 - Lives within 1 hour of care - Has a competent adult at home to recover with post-op recover - NO history of  - Chronic pain requiring opiods  - Diabetes  - Coronary Artery Disease  - Heart failure  - Heart attack  - Stroke  - DVT/VTE  - Cardiac arrhythmia  - Respiratory Failure/COPD  - Renal failure  - Anemia  - Advanced Liver disease

## 2024-03-13 ENCOUNTER — Ambulatory Visit: Payer: Self-pay | Admitting: Student

## 2024-03-14 NOTE — Patient Instructions (Signed)
 DUE TO COVID-19 ONLY TWO VISITORS  (aged 70 and older)  ARE ALLOWED TO COME WITH YOU AND STAY IN THE WAITING ROOM ONLY DURING PRE OP AND PROCEDURE.   **NO VISITORS ARE ALLOWED IN THE SHORT STAY AREA OR RECOVERY ROOM!!**  IF YOU WILL BE ADMITTED INTO THE HOSPITAL YOU ARE ALLOWED ONLY FOUR SUPPORT PEOPLE DURING VISITATION HOURS ONLY (7 AM -8PM)   The support person(s) must pass our screening, gel in and out, and wear a mask at all times, including in the patient's room. Patients must also wear a mask when staff or their support person are in the room. Visitors GUEST BADGE MUST BE WORN VISIBLY  One adult visitor may remain with you overnight and MUST be in the room by 8 P.M.     Your procedure is scheduled on: 03/22/24   Report to [facility] Main Entrance    Report to admitting at : 11:30 AM   Call this number if you have problems the morning of surgery [phone]   Do not eat food :After Midnight.   After Midnight you may have the following liquids until : 11:00 AM DAY OF SURGERY  Water  Black Coffee (sugar ok, NO MILK/CREAM OR CREAMERS)  Tea (sugar ok, NO MILK/CREAM OR CREAMERS) regular and decaf                             Plain Jell-O (NO RED)                                           Fruit ices (not with fruit pulp, NO RED)                                     Popsicles (NO RED)                                                                  Juice: apple, WHITE grape, WHITE cranberry Sports drinks like Gatorade (NO RED)   The day of surgery:  Drink ONE (1) Pre-Surgery Clear Ensure at : 11:00 AM the morning of surgery. Drink in one sitting. Do not sip.  This drink was given to you during your hospital  pre-op appointment visit. Nothing else to drink after completing the  Pre-Surgery Clear Ensure or G2.          If you have questions, please contact your surgeon's office.  FOLLOW ANY ADDITIONAL PRE OP INSTRUCTIONS YOU RECEIVED FROM YOUR SURGEON'S OFFICE!!!   Oral  Hygiene is also important to reduce your risk of infection.                                    Remember - BRUSH YOUR TEETH THE MORNING OF SURGERY WITH YOUR REGULAR TOOTHPASTE  DENTURES WILL BE REMOVED PRIOR TO SURGERY PLEASE DO NOT APPLY "Poly grip" OR ADHESIVES!!!   Do NOT smoke after Midnight   Take these medicines the morning of surgery  with A SIP OF WATER : gabapentin ,duloxetine ,Wellbutrin,metoprolol ,omeprazole ,pantoprazole ,levothyroxine .Diltiazem ,colchicine  as needed.  DO NOT TAKE ANY ORAL DIABETIC MEDICATIONS DAY OF YOUR SURGERY                              STOP TAKING all Vitamins, Herbs and supplements 1 week before your surgery.   You may not have any metal on your body including hair pins, jewelry, and body piercing             Do not wear make-up, lotions, powders, perfumes/cologne, or deodorant  Do not wear nail polish including gel and S&S, artificial/acrylic nails, or any other type of covering on natural nails including finger and toenails. If you have artificial nails, gel coating, etc. that needs to be removed by a nail salon please have this removed prior to surgery or surgery may need to be canceled/ delayed if the surgeon/ anesthesia feels like they are unable to be safely monitored.   Do not shave  48 hours prior to surgery.    Do not bring valuables to the hospital. [facility] IS NOT             RESPONSIBLE   FOR VALUABLES.   Contacts, glasses, or bridgework may not be worn into surgery.   Bring small overnight bag day of surgery.   DO NOT BRING YOUR HOME MEDICATIONS TO THE HOSPITAL. PHARMACY WILL DISPENSE MEDICATIONS LISTED ON YOUR MEDICATION LIST TO YOU DURING YOUR ADMISSION IN THE HOSPITAL!    Patients discharged on the day of surgery will not be allowed to drive home.  Someone NEEDS to stay with you for the first 24 hours after anesthesia.   Special Instructions: Bring a copy of your healthcare power of attorney and living will documents         the day of  surgery if you haven't scanned them before.              Please read over the following fact sheets you were given: IF YOU HAVE QUESTIONS ABOUT YOUR PRE-OP INSTRUCTIONS PLEASE CALL [phone]      Pre-operative 5 CHG Bath Instructions   You can play a key role in reducing the risk of infection after surgery. Your skin needs to be as free of germs as possible. You can reduce the number of germs on your skin by washing with CHG (chlorhexidine  gluconate) soap before surgery. CHG is an antiseptic soap that kills germs and continues to kill germs even after washing.   DO NOT use if you have an allergy to chlorhexidine /CHG or antibacterial soaps. If your skin becomes reddened or irritated, stop using the CHG and notify one of our RNs at [phone].   Please shower with the CHG soap starting 4 days before surgery using the following schedule:     Please keep in mind the following:  DO NOT shave, including legs and underarms, starting the day of your first shower.   You may shave your face at any point before/day of surgery.  Place clean sheets on your bed the day you start using CHG soap. Use a clean washcloth (not used since being washed) for each shower. DO NOT sleep with pets once you start using the CHG.   CHG Shower Instructions:  If you choose to wash your hair and private area, wash first with your normal shampoo/soap.  After you use shampoo/soap, rinse your hair and body thoroughly to remove shampoo/soap residue.  Turn the  water  OFF and apply about 3 tablespoons (45 ml) of CHG soap to a CLEAN washcloth.  Apply CHG soap ONLY FROM YOUR NECK DOWN TO YOUR TOES (washing for 3-5 minutes)  DO NOT use CHG soap on face, private areas, open wounds, or sores.  Pay special attention to the area where your surgery is being performed.  If you are having back surgery, having someone wash your back for you may be helpful. Wait 2 minutes after CHG soap is applied, then you may rinse off the CHG  soap.  Pat dry with a clean towel  Put on clean clothes/pajamas   If you choose to wear lotion, please use ONLY the CHG-compatible lotions on the back of this paper.     Additional instructions for the day of surgery: DO NOT APPLY any lotions, deodorants, cologne, or perfumes.   Put on clean/comfortable clothes.  Brush your teeth.  Ask your nurse before applying any prescription medications to the skin.   CHG Compatible Lotions   Aveeno Moisturizing lotion  Cetaphil Moisturizing Cream  Cetaphil Moisturizing Lotion  Clairol Herbal Essence Moisturizing Lotion, Dry Skin  Clairol Herbal Essence Moisturizing Lotion, Extra Dry Skin  Clairol Herbal Essence Moisturizing Lotion, Normal Skin  Curel Age Defying Therapeutic Moisturizing Lotion with Alpha Hydroxy  Curel Extreme Care Body Lotion  Curel Soothing Hands Moisturizing Hand Lotion  Curel Therapeutic Moisturizing Cream, Fragrance-Free  Curel Therapeutic Moisturizing Lotion, Fragrance-Free  Curel Therapeutic Moisturizing Lotion, Original Formula  Eucerin Daily Replenishing Lotion  Eucerin Dry Skin Therapy Plus Alpha Hydroxy Crme  Eucerin Dry Skin Therapy Plus Alpha Hydroxy Lotion  Eucerin Original Crme  Eucerin Original Lotion  Eucerin Plus Crme Eucerin Plus Lotion  Eucerin TriLipid Replenishing Lotion  Keri Anti-Bacterial Hand Lotion  Keri Deep Conditioning Original Lotion Dry Skin Formula Softly Scented  Keri Deep Conditioning Original Lotion, Fragrance Free Sensitive Skin Formula  Keri Lotion Fast Absorbing Fragrance Free Sensitive Skin Formula  Keri Lotion Fast Absorbing Softly Scented Dry Skin Formula  Keri Original Lotion  Keri Skin Renewal Lotion Keri Silky Smooth Lotion  Keri Silky Smooth Sensitive Skin Lotion  Nivea Body Creamy Conditioning Oil  Nivea Body Extra Enriched Lotion  Nivea Body Original Lotion  Nivea Body Sheer Moisturizing Lotion Nivea Crme  Nivea Skin Firming Lotion  NutraDerm 30 Skin Lotion   NutraDerm Skin Lotion  NutraDerm Therapeutic Skin Cream  NutraDerm Therapeutic Skin Lotion  ProShield Protective Hand Cream  Provon moisturizing lotion   Incentive Spirometer  An incentive spirometer is a tool that can help keep your lungs clear and active. This tool measures how well you are filling your lungs with each breath. Taking long deep breaths may help reverse or decrease the chance of developing breathing (pulmonary) problems (especially infection) following: A long period of time when you are unable to move or be active. BEFORE THE PROCEDURE  If the spirometer includes an indicator to show your best effort, your nurse or respiratory therapist will set it to a desired goal. If possible, sit up straight or lean slightly forward. Try not to slouch. Hold the incentive spirometer in an upright position. INSTRUCTIONS FOR USE  Sit on the edge of your bed if possible, or sit up as far as you can in bed or on a chair. Hold the incentive spirometer in an upright position. Breathe out normally. Place the mouthpiece in your mouth and seal your lips tightly around it. Breathe in slowly and as deeply as possible, raising the piston or the ball  toward the top of the column. Hold your breath for 3-5 seconds or for as long as possible. Allow the piston or ball to fall to the bottom of the column. Remove the mouthpiece from your mouth and breathe out normally. Rest for a few seconds and repeat Steps 1 through 7 at least 10 times every 1-2 hours when you are awake. Take your time and take a few normal breaths between deep breaths. The spirometer may include an indicator to show your best effort. Use the indicator as a goal to work toward during each repetition. After each set of 10 deep breaths, practice coughing to be sure your lungs are clear. If you have an incision (the cut made at the time of surgery), support your incision when coughing by placing a pillow or rolled up towels firmly against  it. Once you are able to get out of bed, walk around indoors and cough well. You may stop using the incentive spirometer when instructed by your caregiver.  RISKS AND COMPLICATIONS Take your time so you do not get dizzy or light-headed. If you are in pain, you may need to take or ask for pain medication before doing incentive spirometry. It is harder to take a deep breath if you are having pain. AFTER USE Rest and breathe slowly and easily. It can be helpful to keep track of a log of your progress. Your caregiver can provide you with a simple table to help with this. If you are using the spirometer at home, follow these instructions: SEEK MEDICAL CARE IF:  You are having difficultly using the spirometer. You have trouble using the spirometer as often as instructed. Your pain medication is not giving enough relief while using the spirometer. You develop fever of 100.5 F (38.1 C) or higher. SEEK IMMEDIATE MEDICAL CARE IF:  You cough up bloody sputum that had not been present before. You develop fever of 102 F (38.9 C) or greater. You develop worsening pain at or near the incision site. MAKE SURE YOU:  Understand these instructions. Will watch your condition. Will get help right away if you are not doing well or get worse. Document Released: 03/22/2007 Document Revised: 02/01/2012 Document Reviewed: 05/23/2007 [facility] Patient Information 2014 [place], [place].   ________________________________________________________________________

## 2024-03-15 ENCOUNTER — Other Ambulatory Visit: Payer: Self-pay

## 2024-03-15 ENCOUNTER — Encounter (HOSPITAL_COMMUNITY)
Admission: RE | Admit: 2024-03-15 | Discharge: 2024-03-15 | Disposition: A | Discharge status: Home / Self Care | Source: Ambulatory Visit | Attending: Orthopedic Surgery | Admitting: Orthopedic Surgery

## 2024-03-15 ENCOUNTER — Encounter (HOSPITAL_COMMUNITY): Payer: Self-pay

## 2024-03-15 VITALS — BP 101/59 | HR 67 | Temp 98.5°F | Ht 64.0 in | Wt 196.0 lb

## 2024-03-15 DIAGNOSIS — Z01818 Encounter for other preprocedural examination: Secondary | ICD-10-CM | POA: Insufficient documentation

## 2024-03-15 DIAGNOSIS — I10 Essential (primary) hypertension: Secondary | ICD-10-CM | POA: Diagnosis not present

## 2024-03-15 HISTORY — DX: Unspecified osteoarthritis, unspecified site: M19.90

## 2024-03-15 LAB — BASIC METABOLIC PANEL WITH GFR
Anion gap: 9 (ref 5–15)
BUN: 18 mg/dL (ref 8–23)
CO2: 31 mmol/L (ref 22–32)
Calcium: 9.3 mg/dL (ref 8.9–10.3)
Chloride: 97 mmol/L — ABNORMAL LOW (ref 98–111)
Creatinine, Ser: 0.88 mg/dL (ref 0.44–1.00)
GFR, Estimated: >60 mL/min (ref >60)
Glucose, Bld: 113 mg/dL — ABNORMAL HIGH (ref 70–99)
Potassium: 4 mmol/L (ref 3.5–5.1)
Sodium: 137 mmol/L (ref 135–145)

## 2024-03-15 LAB — CBC
HCT: 33.7 % — ABNORMAL LOW (ref 36.0–46.0)
Hemoglobin: 9.5 g/dL — ABNORMAL LOW (ref 12.0–15.0)
MCH: 22.3 pg — ABNORMAL LOW (ref 26.0–34.0)
MCHC: 28.2 g/dL — ABNORMAL LOW (ref 30.0–36.0)
MCV: 79.1 fL — ABNORMAL LOW (ref 80.0–100.0)
Platelets: 384 10*3/uL (ref 150–400)
RBC: 4.26 MIL/uL (ref 3.87–5.11)
RDW: 18.1 % — ABNORMAL HIGH (ref 11.5–15.5)
WBC: 8.5 10*3/uL (ref 4.0–10.5)
nRBC: 0 % (ref 0.0–0.2)

## 2024-03-15 LAB — SURGICAL PCR SCREEN
MRSA, PCR: NEGATIVE
Staphylococcus aureus: POSITIVE — AB

## 2024-03-15 NOTE — Progress Notes (Signed)
 PCR: + STAPH. Hemoglobin: 9.5

## 2024-03-15 NOTE — Progress Notes (Addendum)
 For Anesthesia: PCP - Rosslyn Coons, MD  Cardiologist - Lucendia Rusk, MD  Boyce Byes, MD Electrophysiology : Clearance   Bowel Prep reminder:  Chest x-ray -  EKG - 03/15/24 Stress Test -  ECHO - 01/12/23 Cardiac Cath -  Pacemaker/ICD device last checked: Pacemaker orders received: Device Rep notified:  Spinal Cord Stimulator:N/A  Sleep Study - N/A CPAP -   Fasting Blood Sugar - N/A Checks Blood Sugar _____ times a day Date and result of last Hgb A1c-  Last dose of GLP1 agonist- N/A GLP1 instructions:   Last dose of SGLT-2 inhibitors- N/A SGLT-2 instructions:   Blood Thinner Instructions: Eliquis : will be hold after: 03/18/24 Aspirin Instructions: Last Dose:  Activity level: Can go up a flight of stairs and activities of daily living without stopping and without chest pain and/or shortness of breath      Unable to go up a flight of stairs without shortness of breath    Anesthesia review: Hx: HTN,Afib,SOB.  Patient denies shortness of breath, fever, cough and chest pain at PAT appointment   Patient verbalized understanding of instructions that were given to them at the PAT appointment. Patient was also instructed that they will need to review over the PAT instructions again at home before surgery.

## 2024-03-22 ENCOUNTER — Encounter (HOSPITAL_COMMUNITY): Admission: RE | Payer: Self-pay | Source: Home / Self Care

## 2024-03-22 ENCOUNTER — Ambulatory Visit (HOSPITAL_COMMUNITY): Admission: RE | Admit: 2024-03-22 | Payer: Medicare Other | Source: Home / Self Care | Admitting: Orthopedic Surgery

## 2024-03-22 LAB — TYPE AND SCREEN
ABO/RH(D): A NEG
Antibody Screen: NEGATIVE

## 2024-03-22 SURGERY — ARTHROPLASTY, HIP, TOTAL, ANTERIOR APPROACH
Anesthesia: Spinal | Site: Hip | Laterality: Right

## 2024-03-28 DIAGNOSIS — F1120 Opioid dependence, uncomplicated: Secondary | ICD-10-CM | POA: Diagnosis not present

## 2024-03-28 DIAGNOSIS — G894 Chronic pain syndrome: Secondary | ICD-10-CM | POA: Diagnosis not present

## 2024-04-05 DIAGNOSIS — E079 Disorder of thyroid, unspecified: Secondary | ICD-10-CM | POA: Diagnosis not present

## 2024-04-05 DIAGNOSIS — N6091 Unspecified benign mammary dysplasia of right breast: Secondary | ICD-10-CM | POA: Diagnosis not present

## 2024-04-05 DIAGNOSIS — Z7901 Long term (current) use of anticoagulants: Secondary | ICD-10-CM | POA: Diagnosis not present

## 2024-04-05 DIAGNOSIS — I10 Essential (primary) hypertension: Secondary | ICD-10-CM | POA: Diagnosis not present

## 2024-04-05 DIAGNOSIS — D509 Iron deficiency anemia, unspecified: Secondary | ICD-10-CM | POA: Diagnosis not present

## 2024-04-05 DIAGNOSIS — E785 Hyperlipidemia, unspecified: Secondary | ICD-10-CM | POA: Diagnosis not present

## 2024-04-05 DIAGNOSIS — I4891 Unspecified atrial fibrillation: Secondary | ICD-10-CM | POA: Diagnosis not present

## 2024-04-05 DIAGNOSIS — E538 Deficiency of other specified B group vitamins: Secondary | ICD-10-CM | POA: Diagnosis not present

## 2024-04-05 DIAGNOSIS — Z79899 Other long term (current) drug therapy: Secondary | ICD-10-CM | POA: Diagnosis not present

## 2024-04-11 DIAGNOSIS — E538 Deficiency of other specified B group vitamins: Secondary | ICD-10-CM | POA: Diagnosis not present

## 2024-04-11 DIAGNOSIS — D509 Iron deficiency anemia, unspecified: Secondary | ICD-10-CM | POA: Diagnosis not present

## 2024-04-24 DIAGNOSIS — D509 Iron deficiency anemia, unspecified: Secondary | ICD-10-CM | POA: Diagnosis not present

## 2024-05-10 DIAGNOSIS — I4891 Unspecified atrial fibrillation: Secondary | ICD-10-CM | POA: Diagnosis not present

## 2024-05-10 DIAGNOSIS — D509 Iron deficiency anemia, unspecified: Secondary | ICD-10-CM | POA: Diagnosis not present

## 2024-05-10 DIAGNOSIS — N6091 Unspecified benign mammary dysplasia of right breast: Secondary | ICD-10-CM | POA: Diagnosis not present

## 2024-05-10 DIAGNOSIS — R5383 Other fatigue: Secondary | ICD-10-CM | POA: Diagnosis not present

## 2024-05-10 DIAGNOSIS — E538 Deficiency of other specified B group vitamins: Secondary | ICD-10-CM | POA: Diagnosis not present

## 2024-05-10 DIAGNOSIS — Z7901 Long term (current) use of anticoagulants: Secondary | ICD-10-CM | POA: Diagnosis not present

## 2024-05-31 ENCOUNTER — Other Ambulatory Visit (HOSPITAL_COMMUNITY): Payer: Self-pay | Admitting: Interventional Cardiology

## 2024-06-01 ENCOUNTER — Ambulatory Visit: Payer: Self-pay | Admitting: Student

## 2024-06-01 NOTE — Telephone Encounter (Signed)
 01/2023 Varanasi made PRN, will Lambert take over prescribing. Please review

## 2024-06-13 NOTE — Progress Notes (Signed)
 COVID Vaccine received:  [x]  No []  Yes Date of any COVID positive Test in last 90 days:  none  PCP - Garnette Ore, MD  Cardiologist - Gordy Reek, MD  EP- Ole Holts, MD  cardiac clearance in 02-10-24 note  Chest x-ray - 11-20-2020  2v Epic EKG - 03-15-24  Epic  Stress Test -  ECHO - 01-12-2023  Epic Cardiac Cath -  CT Coronary Calcium score: 1.22 on 02-01-2023   Pacemaker / ICD device [x]  No []  Yes   Spinal Cord Stimulator:[x]  No []  Yes       History of Sleep Apnea? [x]  No []  Yes   CPAP used?- [x]  No []  Yes    Does the patient monitor blood sugar?   [x]  N/A   []  No []  Yes  Patient has: [x]  NO Hx DM   []  Pre-DM   []  DM1  []   DM2  Blood Thinner / Instructions: Eliquis -  Hold x 72 hours  ok'd by Dr. Holts  Aspirin Instructions:  ERAS Protocol Ordered: []  No  [x]  Yes PRE-SURGERY [x]  ENSURE  []  G2   Patient is to be NPO after: 0800  Dental hx: [x]  Dentures: Upper []  N/A      []  Bridge or Partial:                   []  Loose or Damaged teeth:   Comments: Patient was given the 5 CHG shower / bath instructions for THA surgery along with 2 bottles of the CHG soap. Patient will start this on:  Saturday  June 17, 2024     Activity level: Able to walk up 2 flights of stairs without becoming significantly short of breath or having chest pain?  [x]  No   []    Yes    But  Patient can perform ADLs without assistance.   Anesthesia review: HTN, A.fib, Mild AS, nonobstructive CAD, GERD, ACDF (C3-4) anxiety, s/p right breast lumpectomy only. No Chemo or radiation. PONV  Patient denies any S&S of respiratory illness or Covid - no shortness of breath, fever, cough or chest pain at PAT appointment.  Patient verbalized understanding and agreement to the Pre-Surgical Instructions that were given to them at this PAT appointment. Patient was also educated of the need to review these PAT instructions again prior to her surgery.I reviewed the appropriate phone numbers to call if they have any and  questions or concerns.

## 2024-06-13 NOTE — Patient Instructions (Signed)
 SURGICAL WAITING ROOM VISITATION Patients having surgery or a procedure may have no more than 2 support people in the waiting area - these visitors may rotate in the visitor waiting room.   If the patient needs to stay at the hospital during part of their recovery, the visitor guidelines for inpatient rooms apply.  PRE-OP VISITATION  Pre-op nurse will coordinate an appropriate time for 1 support person to accompany the patient in pre-op.  This support person may not rotate.  This visitor will be contacted when the time is appropriate for the visitor to come back in the pre-op area.  Please refer to the [facility] website for the visitor guidelines for Inpatients (after your surgery is over and you are in a regular room).  You are not required to quarantine at this time prior to your surgery. However, you must do this: Hand Hygiene often Do NOT share personal items Notify your provider if you are in close contact with someone who has COVID or you develop fever 100.4 or greater, new onset of sneezing, cough, sore throat, shortness of breath or body aches.  If you test positive for Covid or have been in contact with anyone that has tested positive in the last 10 days please notify you surgeon.    Your procedure is scheduled on:  Wednesday  June 21, 2024  Report to [facility] Main Entrance: [address] entrance where the [employer] is available.   Report to admitting at:  08:30   AM  Call this number if you have any questions or problems the morning of surgery [phone]  Do not eat food after Midnight the night prior to your surgery/procedure.  After Midnight you may have the following liquids until    08:00 AM DAY OF SURGERY  Clear Liquid Diet Water  Black Coffee (sugar ok, NO MILK/CREAM OR CREAMERS)  Tea (sugar ok, NO MILK/CREAM OR CREAMERS) regular and decaf                             Plain Jell-O  with no fruit (NO RED)                                           Fruit  ices (not with fruit pulp, NO RED)                                     Popsicles (NO RED)                                                                  Juice: NO CITRUS JUICES: only apple, WHITE grape, WHITE cranberry Sports drinks like Gatorade or Powerade (NO RED)                   The day of surgery:  Drink ONE (1) Pre-Surgery Clear Ensure at   08:00 AM the morning of surgery. Drink in one sitting. Do not sip.  This drink was given to you during your hospital pre-op appointment visit. Nothing else to  drink after completing the Pre-Surgery Clear Ensure  : No candy, chewing gum or throat lozenges.    FOLLOW ANY ADDITIONAL PRE OP INSTRUCTIONS YOU RECEIVED FROM YOUR SURGEON'S OFFICE!!!   Oral Hygiene is also important to reduce your risk of infection.        Remember - BRUSH YOUR TEETH THE MORNING OF SURGERY WITH YOUR REGULAR TOOTHPASTE  Do NOT smoke after Midnight the night before surgery.  STOP TAKING all Vitamins, Herbs and supplements 1 week before your surgery.   ELIQUIS - Stop taking 72 hours before surgery. Last dose will be taken on Saturday June 17, 2024  Take ONLY these medicines the morning of surgery with A SIP OF WATER : pantoprazole , levothyroxine , metoprolol , gabapentin , Diltiazem  (HR >100) and you may take EITHER Tylenol  OR Oxycodone -APAP if needed for pain.                     You may not have any metal on your body including hair pins, jewelry, and body piercing  Do not wear make-up, lotions, powders, perfumes or deodorant  Do not wear nail polish including gel and S&S, artificial / acrylic nails, or any other type of covering on natural nails including finger and toenails. If you have artificial nails, gel coating, etc., that needs to be removed by a nail salon, Please have this removed prior to surgery. Not doing so may mean that your surgery could be cancelled or delayed if the Surgeon or anesthesia staff feels like they are unable to monitor you safely.   Do  not shave 48 hours prior to surgery to avoid nicks in your skin which may contribute to postoperative infections.   Contacts, Hearing Aids, dentures or bridgework may not be worn into surgery. DENTURES WILL BE REMOVED PRIOR TO SURGERY PLEASE DO NOT APPLY Poly grip OR ADHESIVES!!!  You may bring a small overnight bag with you on the day of surgery, only pack items that are not valuable. [facility] IS NOT RESPONSIBLE   FOR VALUABLES THAT ARE LOST OR STOLEN.   Do not bring your home medications to the hospital. The Pharmacy will dispense medications listed on your medication list to you during your admission in the Hospital.  Please read over the following fact sheets you were given: IF YOU HAVE QUESTIONS ABOUT YOUR PRE-OP INSTRUCTIONS, PLEASE CALL [phone].     Pre-operative 5 CHG Bath Instructions   You can play a key role in reducing the risk of infection after surgery. Your skin needs to be as free of germs as possible. You can reduce the number of germs on your skin by washing with CHG (chlorhexidine  gluconate) soap before surgery. CHG is an antiseptic soap that kills germs and continues to kill germs even after washing.   DO NOT use if you have an allergy to chlorhexidine /CHG or antibacterial soaps. If your skin becomes reddened or irritated, stop using the CHG and notify one of our RNs at [phone]  Please shower with the CHG soap starting 4 days before surgery using the following schedule: START SHOWERS ON   SATURDAY  June 17, 2024  Please keep in mind the following:  DO NOT shave, including legs and underarms, starting the day of your first shower.   You may shave your face at any point before/day of surgery.   Place clean sheets on your bed the day you start using CHG soap. Use a clean washcloth (not  used since being washed) for each shower. DO NOT sleep with pets once you start using the CHG.   CHG Shower Instructions:  If you choose to wash your hair and private area, wash first with your normal shampoo/soap.  After you use shampoo/soap, rinse your hair and body thoroughly to remove shampoo/soap residue.  Turn the water  OFF and apply about 3 tablespoons (45 ml) of CHG soap to a CLEAN washcloth.  Apply CHG soap ONLY FROM YOUR NECK DOWN TO YOUR TOES (washing for 3-5 minutes)  DO NOT use CHG soap on face, private areas, open wounds, or sores.  Pay special attention to the area where your surgery is being performed.  If you are having back surgery, having someone wash your back for you may be helpful.  Wait 2 minutes after CHG soap is applied, then you may rinse off the CHG soap.  Pat dry with a clean towel  Put on clean clothes/pajamas   If you choose to wear lotion, please use ONLY the CHG-compatible lotions on the back of this paper.     Additional instructions for the day of surgery: DO NOT APPLY any lotions, deodorants, cologne, or perfumes.   Put on clean/comfortable clothes.  Brush your teeth.  Ask your nurse before applying any prescription medications to the skin.      CHG Compatible Lotions   Aveeno Moisturizing lotion  Cetaphil Moisturizing Cream  Cetaphil Moisturizing Lotion  Clairol Herbal Essence Moisturizing Lotion, Dry Skin  Clairol Herbal Essence Moisturizing Lotion, Extra Dry Skin  Clairol Herbal Essence Moisturizing Lotion, Normal Skin  Curel Age Defying Therapeutic Moisturizing Lotion with Alpha Hydroxy  Curel Extreme Care Body Lotion  Curel Soothing Hands Moisturizing Hand Lotion  Curel Therapeutic Moisturizing Cream, Fragrance-Free  Curel Therapeutic Moisturizing Lotion, Fragrance-Free  Curel Therapeutic Moisturizing Lotion, Original Formula  Eucerin Daily Replenishing Lotion  Eucerin Dry Skin Therapy Plus Alpha Hydroxy Crme  Eucerin Dry Skin  Therapy Plus Alpha Hydroxy Lotion  Eucerin Original Crme  Eucerin Original Lotion  Eucerin Plus Crme Eucerin Plus Lotion  Eucerin TriLipid Replenishing Lotion  Keri Anti-Bacterial Hand Lotion  Keri Deep Conditioning Original Lotion Dry Skin Formula Softly Scented  Keri Deep Conditioning Original Lotion, Fragrance Free Sensitive Skin Formula  Keri Lotion Fast Absorbing Fragrance Free Sensitive Skin Formula  Keri Lotion Fast Absorbing Softly Scented Dry Skin Formula  Keri Original Lotion  Keri Skin Renewal Lotion Keri Silky Smooth Lotion  Keri Silky Smooth Sensitive Skin Lotion  Nivea Body Creamy Conditioning Oil  Nivea Body Extra Enriched Lotion  Nivea Body Original Lotion  Nivea Body Sheer Moisturizing Lotion Nivea Crme  Nivea Skin Firming Lotion  NutraDerm 30 Skin Lotion  NutraDerm Skin Lotion  NutraDerm Therapeutic Skin Cream  NutraDerm Therapeutic Skin Lotion  ProShield Protective Hand Cream  Provon moisturizing lotion   FAILURE TO FOLLOW THESE INSTRUCTIONS MAY RESULT IN THE CANCELLATION OF YOUR SURGERY  PATIENT SIGNATURE_________________________________  NURSE SIGNATURE__________________________________  ________________________________________________________________________        Kathryn [name]    An incentive spirometer is a tool that can help keep your lungs clear and active. This tool measures how well you are filling your lungs with each breath.  Taking long deep breaths may help reverse or decrease the chance of developing breathing (pulmonary) problems (especially infection) following: A long period of time when you are unable to move or be active. BEFORE THE PROCEDURE  If the spirometer includes an indicator to show your best effort, your nurse or respiratory therapist will set it to a desired goal. If possible, sit up straight or lean slightly forward. Try not to slouch. Hold the incentive spirometer in an upright position. INSTRUCTIONS FOR USE   Sit on the edge of your bed if possible, or sit up as far as you can in bed or on a chair. Hold the incentive spirometer in an upright position. Breathe out normally. Place the mouthpiece in your mouth and seal your lips tightly around it. Breathe in slowly and as deeply as possible, raising the piston or the ball toward the top of the column. Hold your breath for 3-5 seconds or for as long as possible. Allow the piston or ball to fall to the bottom of the column. Remove the mouthpiece from your mouth and breathe out normally. Rest for a few seconds and repeat Steps 1 through 7 at least 10 times every 1-2 hours when you are awake. Take your time and take a few normal breaths between deep breaths. The spirometer may include an indicator to show your best effort. Use the indicator as a goal to work toward during each repetition. After each set of 10 deep breaths, practice coughing to be sure your lungs are clear. If you have an incision (the cut made at the time of surgery), support your incision when coughing by placing a pillow or rolled up towels firmly against it. Once you are able to get out of bed, walk around indoors and cough well. You may stop using the incentive spirometer when instructed by your caregiver.  RISKS AND COMPLICATIONS Take your time so you do not get dizzy or light-headed. If you are in pain, you may need to take or ask for pain medication before doing incentive spirometry. It is harder to take a deep breath if you are having pain. AFTER USE Rest and breathe slowly and easily. It can be helpful to keep track of a log of your progress. Your caregiver can provide you with a simple table to help with this. If you are using the spirometer at home, follow these instructions: SEEK MEDICAL CARE IF:  You are having difficultly using the spirometer. You have trouble using the spirometer as often as instructed. Your pain medication is not giving enough relief while using the  spirometer. You develop fever of 100.5 F (38.1 C) or higher.                                                                                                    SEEK IMMEDIATE MEDICAL CARE IF:  You cough up bloody sputum that had not been present before. You develop fever of 102 F (38.9 C) or greater. You develop worsening pain at or near the incision site. MAKE SURE YOU:  Understand these instructions. Will watch your  condition. Will get help right away if you are not doing well or get worse. Document Released: 03/22/2007 Document Revised: 02/01/2012 Document Reviewed: 05/23/2007 [facility] Patient Information 2014 [place], [place].       If you would like to see a video about joint replacement:   [email or web address]

## 2024-06-14 ENCOUNTER — Other Ambulatory Visit: Payer: Self-pay

## 2024-06-14 ENCOUNTER — Encounter (HOSPITAL_COMMUNITY)
Admission: RE | Admit: 2024-06-14 | Discharge: 2024-06-14 | Disposition: A | Discharge status: Home / Self Care | Source: Ambulatory Visit | Attending: Orthopedic Surgery | Admitting: Orthopedic Surgery

## 2024-06-14 ENCOUNTER — Encounter (HOSPITAL_COMMUNITY): Payer: Self-pay

## 2024-06-14 VITALS — BP 102/62 | HR 66 | Temp 98.0°F | Resp 20 | Ht 64.0 in | Wt 197.0 lb

## 2024-06-14 DIAGNOSIS — F419 Anxiety disorder, unspecified: Secondary | ICD-10-CM | POA: Diagnosis not present

## 2024-06-14 DIAGNOSIS — Z79891 Long term (current) use of opiate analgesic: Secondary | ICD-10-CM | POA: Insufficient documentation

## 2024-06-14 DIAGNOSIS — Z01812 Encounter for preprocedural laboratory examination: Secondary | ICD-10-CM | POA: Diagnosis not present

## 2024-06-14 DIAGNOSIS — E039 Hypothyroidism, unspecified: Secondary | ICD-10-CM | POA: Insufficient documentation

## 2024-06-14 DIAGNOSIS — G894 Chronic pain syndrome: Secondary | ICD-10-CM | POA: Diagnosis not present

## 2024-06-14 DIAGNOSIS — Z7901 Long term (current) use of anticoagulants: Secondary | ICD-10-CM | POA: Insufficient documentation

## 2024-06-14 DIAGNOSIS — Z981 Arthrodesis status: Secondary | ICD-10-CM | POA: Insufficient documentation

## 2024-06-14 DIAGNOSIS — K219 Gastro-esophageal reflux disease without esophagitis: Secondary | ICD-10-CM | POA: Diagnosis not present

## 2024-06-14 DIAGNOSIS — Z01818 Encounter for other preprocedural examination: Secondary | ICD-10-CM

## 2024-06-14 DIAGNOSIS — F32A Depression, unspecified: Secondary | ICD-10-CM | POA: Insufficient documentation

## 2024-06-14 DIAGNOSIS — I2510 Atherosclerotic heart disease of native coronary artery without angina pectoris: Secondary | ICD-10-CM | POA: Insufficient documentation

## 2024-06-14 DIAGNOSIS — M1611 Unilateral primary osteoarthritis, right hip: Secondary | ICD-10-CM | POA: Insufficient documentation

## 2024-06-14 DIAGNOSIS — I4891 Unspecified atrial fibrillation: Secondary | ICD-10-CM | POA: Diagnosis not present

## 2024-06-14 DIAGNOSIS — I10 Essential (primary) hypertension: Secondary | ICD-10-CM | POA: Insufficient documentation

## 2024-06-14 HISTORY — DX: Myoneural disorder, unspecified: G70.9

## 2024-06-14 LAB — SURGICAL PCR SCREEN
MRSA, PCR: NEGATIVE
Staphylococcus aureus: POSITIVE — AB

## 2024-06-14 LAB — CBC
HCT: 42.3 % (ref 36.0–46.0)
Hemoglobin: 12.8 g/dL (ref 12.0–15.0)
MCH: 28.3 pg (ref 26.0–34.0)
MCHC: 30.3 g/dL (ref 30.0–36.0)
MCV: 93.4 fL (ref 80.0–100.0)
Platelets: 249 K/uL (ref 150–400)
RBC: 4.53 MIL/uL (ref 3.87–5.11)
RDW: 20 % — ABNORMAL HIGH (ref 11.5–15.5)
WBC: 6.2 K/uL (ref 4.0–10.5)
nRBC: 0 % (ref 0.0–0.2)

## 2024-06-14 LAB — BASIC METABOLIC PANEL WITH GFR
Anion gap: 10 (ref 5–15)
BUN: 20 mg/dL (ref 8–23)
CO2: 29 mmol/L (ref 22–32)
Calcium: 9.3 mg/dL (ref 8.9–10.3)
Chloride: 97 mmol/L — ABNORMAL LOW (ref 98–111)
Creatinine, Ser: 0.92 mg/dL (ref 0.44–1.00)
GFR, Estimated: >60 mL/min (ref >60)
Glucose, Bld: 114 mg/dL — ABNORMAL HIGH (ref 70–99)
Potassium: 3.9 mmol/L (ref 3.5–5.1)
Sodium: 136 mmol/L (ref 135–145)

## 2024-06-14 NOTE — Progress Notes (Signed)
 Patient's PCR screen is positive for [facility]. Appropriate notes have been placed on the patient's chart. This note has been routed to Dr.Swinteck and Valery Potters, PA for review. The Patient's surgery is currently scheduled for: 06-21-24 at [facility].  Shawnee Aloe, BSN, CVRN-BC   Pre-Surgical Testing Nurse [facility]  [phone]

## 2024-06-15 NOTE — Anesthesia Preprocedure Evaluation (Addendum)
 Anesthesia Evaluation  Patient identified by MRN, date of birth, ID band Patient awake    Reviewed: Allergy & Precautions, NPO status , Patient's Chart, lab work & pertinent test results, reviewed documented beta blocker date and time   History of Anesthesia Complications (+) PONV and history of anesthetic complications  Airway Mallampati: III  TM Distance: >3 FB Neck ROM: Full    Dental  (+) Missing, Chipped,    Pulmonary neg pulmonary ROS   Pulmonary exam normal        Cardiovascular hypertension, Pt. on home beta blockers and Pt. on medications Normal cardiovascular exam+ dysrhythmias (on Eliquis ) Atrial Fibrillation   TTE 12/2022: EF 60-65%, grade I DD, mild AS    Neuro/Psych  Headaches  Anxiety Depression       GI/Hepatic Neg liver ROS, hiatal hernia,GERD  Medicated and Poorly Controlled,,  Endo/Other  Hypothyroidism    Renal/GU negative Renal ROS     Musculoskeletal  (+) Arthritis ,    Abdominal   Peds  Hematology negative hematology ROS (+)   Anesthesia Other Findings Day of surgery medications reviewed with patient.  Reproductive/Obstetrics                              Anesthesia Physical Anesthesia Plan  ASA: 2  Anesthesia Plan: General   Post-op Pain Management: Tylenol  PO (pre-op)*   Induction: Intravenous and Rapid sequence  PONV Risk Score and Plan: 4 or greater and Treatment may vary due to age or medical condition, Ondansetron , Propofol  infusion, Dexamethasone , Midazolam  and TIVA  Airway Management Planned: Oral ETT  Additional Equipment: None  Intra-op Plan:   Post-operative Plan: Extubation in OR  Informed Consent: I have reviewed the patients History and Physical, chart, labs and discussed the procedure including the risks, benefits and alternatives for the proposed anesthesia with the patient or authorized representative who has indicated his/her  understanding and acceptance.     Dental advisory given  Plan Discussed with: CRNA  Anesthesia Plan Comments: (GETA/RSI due to severe uncontrolled GERD. )         Anesthesia Quick Evaluation

## 2024-06-15 NOTE — Progress Notes (Signed)
 Case: [id number] Date/Time: 06/21/24 1055   Procedure: ARTHROPLASTY, HIP, TOTAL, ANTERIOR APPROACH (Right: Hip)   Anesthesia type: Spinal   Diagnosis: Primary osteoarthritis of right hip [M16.11]   Pre-op diagnosis: Right hip osteoarthritis   [facility] ROOM 07 / [facility]   Surgeons: Fidel Rogue, MD       DISCUSSION: Kathryn [name] is a 68 yo female with PMH of HTN, nonobstructive CAD (by CT), A.fib on Eliquis , GERD, hiatal hernia, hypothyroidism, anemia, arthritis, anxiety, depression, chronic pain syndrome with narcotic dependence, hx of lumbar fusion (L4-5 PLIF 07/04/19; L2-4 anterolateral fusion 11/21/19), neck fusion (remotely)  Prior anesthesia complications include PONV  Patient diagnosed with A.fib in 2021 during an endoscopy. Last seen by Dr. Cindie with EP on 02/10/24. She has a low burden of A.fib and takes Diltiazem  prn for palpitations. On Eliquis . Noted to be doing well and cleared for surgery:  Preop restratification The patient is at acceptable risk to undergo orthopedic surgery.  She is okay to hold her Eliquis  for 2 to 3 days prior to the surgery and restart when felt safe from a postop surgical perspective.  The risks of stopping anticoagulation in the perioperative period have been discussed with the patient during today's clinic appointment and she is interested in proceeding with this strategy.  Patient follows with Hematology at [facility] for anemia. Anemia resolved on PAT labs. Clearance scanned in media on 05/23/24  LD Eliquis : 7/26  VS: BP 102/62 Comment: left arm sitting  Pulse 66   Temp 36.7 C (Oral)   Resp 20   Ht 5' 4 (1.626 m)   Wt 89.4 kg   LMP  (LMP Unknown)   SpO2 96%   BMI 33.81 kg/m   PROVIDERS: Nichole Senior, MD   LABS: Labs reviewed: Acceptable for surgery. (all labs ordered are listed, but only abnormal results are displayed)  Labs Reviewed  SURGICAL PCR SCREEN - Abnormal; Notable for the following components:      Result Value    Staphylococcus aureus POSITIVE (*)    All other components within normal limits  BASIC METABOLIC PANEL WITH GFR - Abnormal; Notable for the following components:   Chloride 97 (*)    Glucose, Bld 114 (*)    All other components within normal limits  CBC - Abnormal; Notable for the following components:   RDW 20.0 (*)    All other components within normal limits  TYPE AND SCREEN     IMAGES:   EKG 03/15/24:  Normal sinus rhythm Possible Left atrial enlargement Left ventricular hypertrophy with repolarization abnormality ( R in aVL ) Abnormal ECG  CV:  CTA coronary 02/01/23:  IMPRESSION: 1. Calcium score 1.22 which is only 46 th percentile for age/sex   2.  Normal ascending thoracic aorta 3.4 cm   3.  Calcified aortic valve with score 683 indicating non severe AS   4.  CAD RADS 1 non obstructive CAD see description above  Echo 01/12/23:  IMPRESSIONS    1. Left ventricular ejection fraction, by estimation, is 60 to 65%. The left ventricle has normal function. The left ventricle has no regional wall motion abnormalities. Left ventricular diastolic parameters are consistent with Grade I diastolic dysfunction (impaired relaxation).  2. Right ventricular systolic function is normal. The right ventricular size is normal. There is normal pulmonary artery systolic pressure.  3. The mitral valve is normal in structure. Trivial mitral valve regurgitation. No evidence of mitral stenosis.  4. The aortic valve is tricuspid. There is moderate  calcification of the aortic valve. Aortic valve regurgitation is not visualized. Mild aortic valve stenosis. Aortic valve area, by VTI measures 1.38 cm. Aortic valve mean gradient measures 15.0 mmHg. Aortic valve Vmax measures 2.42 m/s.  5. The inferior vena cava is normal in size with greater than 50% respiratory variability, suggesting right atrial pressure of 3 mmHg.  Past Medical History:  Diagnosis Date   Anemia    Anxiety     Arthritis    Breast cancer (HCC)    Right breast, had lumpectomy only no chemo, radiation   Cancer (HCC) 10/2016   Breast right,  lumpectomy,  did not require chemo/xrt   DDD (degenerative disc disease), cervical    Depression    Dyspnea    Dysrhythmia    A Fib   Elevated cholesterol    GERD (gastroesophageal reflux disease)    H/O hiatal hernia    Headache    Hypertension    Hypothyroidism    Neuromuscular disorder (HCC)    neuropathy BLE and Feet numbness   Paroxysmal atrial fibrillation (HCC) 11/20/2020   PONV (postoperative nausea and vomiting)    Tremor    in hands and down left leg   Wears partial dentures    upper    Past Surgical History:  Procedure Laterality Date   ABDOMINAL HYSTERECTOMY  yrs ago   ANTERIOR LAT LUMBAR FUSION N/A 11/21/2019   Procedure: Lumbar two-three Lumbar three-four Anterolateral lumbar interbody fusion with lateral plate;  Surgeon: Colon Shove, MD;  Location: [facility];  Service: Neurosurgery;  Laterality: N/A;   BIOPSY  11/23/2023   Procedure: BIOPSY;  Surgeon: Saintclair Jasper, MD;  Location: [facility];  Service: Gastroenterology;;   BOTOX  INJECTION N/A 11/23/2023   Procedure: BOTOX  INJECTION;  Surgeon: Saintclair Jasper, MD;  [facility];  Service: Gastroenterology;  Laterality: N/A;   BREAST EXCISIONAL BIOPSY Right 11/2016   ALH and papillomas   BREAST LUMPECTOMY Right 2017   BREAST LUMPECTOMY WITH RADIOACTIVE SEED LOCALIZATION Right 12/15/2016   Procedure: RADIOACTIVE SEED X 2 GUIDED EXCISIONAL BREAST BIOPSY;  Surgeon: Donnice Bury, MD;  Location: [facility];  Service: General;  Laterality: Right;   CARPAL TUNNEL RELEASE Bilateral yrs ago   x 2   cervical neck fusion  yrs ago   C 3 and C 4    COLONOSCOPY     ESOPHAGOGASTRODUODENOSCOPY (EGD) WITH PROPOFOL  N/A 11/23/2023   Procedure: ESOPHAGOGASTRODUODENOSCOPY (EGD) WITH PROPOFOL ;  Surgeon: Saintclair Jasper, MD;  [facility];  Service: Gastroenterology;   Laterality: N/A;   ESOPHAGOGASTRODUODENOSCOPY ENDOSCOPY N/A 01/09/2022   Procedure: INTRAOPERATIVE ENDOSCOPY;  Surgeon: Rubin Calamity, MD;  Location: [facility];  Service: General;  Laterality: N/A;   EYE SURGERY Bilateral    Cataracts removed   left knee arthroscopy   last done 2008   x 3   left shoulder arthroscopy  yrs ago   lower back surgery  2005   right total knee replacement   2008   TOTAL HIP ARTHROPLASTY Left 04/08/2023   Procedure: TOTAL HIP ARTHROPLASTY ANTERIOR APPROACH;  Surgeon: Fidel Rogue, MD;  [facility];  Service: Orthopedics;  Laterality: Left;  150   TOTAL KNEE ARTHROPLASTY  01/14/2012   Procedure: TOTAL KNEE ARTHROPLASTY;  Surgeon: Reyes JAYSON Billing, MD;  Location[facility];  Service: Orthopedics;  Laterality: Left;  preop femoral nerve block   UPPER GI ENDOSCOPY     WISDOM TOOTH EXTRACTION      MEDICATIONS:  acetaminophen  (TYLENOL ) 500 MG tablet  apixaban  (ELIQUIS ) 5 MG TABS tablet   Camphor-Menthol -Methyl Sal (SALONPAS) 3.11-28-08 % PTCH   Cholecalciferol (D3 PO)   colchicine  0.6 MG tablet   Cyanocobalamin  (VITAMIN B-12 PO)   diltiazem  (CARDIZEM ) 30 MG tablet   DULoxetine  (CYMBALTA ) 30 MG capsule   DULoxetine  (CYMBALTA ) 60 MG capsule   furosemide  (LASIX ) 20 MG tablet   gabapentin  (NEURONTIN ) 600 MG tablet   hydrochlorothiazide  (HYDRODIURIL ) 25 MG tablet   levothyroxine  (SYNTHROID ) 200 MCG tablet   methocarbamol  (ROBAXIN ) 500 MG tablet   metoprolol  succinate (TOPROL -XL) 100 MG 24 hr tablet   oxyCODONE -acetaminophen  (PERCOCET) 7.5-325 MG tablet   pantoprazole  (PROTONIX ) 40 MG tablet   No current facility-administered medications for this encounter.   Burnard CHRISTELLA Odis DEVONNA [facility] Short Stay/Anesthesiology [facility] Phone [phone] 06/15/2024 9:49 AM

## 2024-06-16 ENCOUNTER — Ambulatory Visit: Payer: Self-pay | Admitting: Student

## 2024-06-16 NOTE — H&P (Signed)
 TOTAL HIP ADMISSION H&P  Patient is admitted for right total hip arthroplasty.  Subjective:  Chief Complaint: right hip pain  HPI: Kathryn [name], 68 y.o. female, has a history of pain and functional disability in the right hip(s) due to arthritis and patient has failed non-surgical conservative treatments for greater than 12 weeks to include NSAID's and/or analgesics, corticosteriod injections, flexibility and strengthening excercises, use of assistive devices, and activity modification.  Onset of symptoms was gradual starting 10 years ago with rapidlly worsening course since that time.The patient noted no past surgery on the right hip(s).  Patient currently rates pain in the right hip at 10 out of 10 with activity. Patient has night pain, worsening of pain with activity and weight bearing, trendelenberg gait, pain that interfers with activities of daily living, and pain with passive range of motion. Patient has evidence of subchondral cysts, subchondral sclerosis, periarticular osteophytes, and joint space narrowing by imaging studies. This condition presents safety issues increasing the risk of falls.  There is no current active infection.  Patient Active Problem List   Diagnosis Date Noted   Primary osteoarthritis of left hip 04/08/2023   Aortic atherosclerosis (HCC) 04/09/2022   Esophageal achalasia 01/09/2022   Chronic left-sided lumbar radiculopathy 03/27/2021   Lumbar radiculopathy, chronic 11/21/2019   Spondylolisthesis of lumbar region 07/04/2019   Spondylolisthesis at L4-L5 level 07/04/2019   Atypical lobular hyperplasia [facility]) of right breast 02/09/2017   Osteoarthritis of knee 01/15/2012   HYPOTHYROIDISM 07/26/2008   HYPERLIPIDEMIA 07/26/2008   HYPERTENSION 07/26/2008   DYSPNEA 07/26/2008   CARPAL TUNNEL SYNDROME, HX OF 07/26/2008   Past Medical History:  Diagnosis Date   Anemia    Anxiety    Arthritis    Breast cancer (HCC)    Right breast, had lumpectomy only no  chemo, radiation   Cancer (HCC) 10/2016   Breast right,  lumpectomy,  did not require chemo/xrt   DDD (degenerative disc disease), cervical    Depression    Dyspnea    Dysrhythmia    A Fib   Elevated cholesterol    GERD (gastroesophageal reflux disease)    H/O hiatal hernia    Headache    Hypertension    Hypothyroidism    Neuromuscular disorder (HCC)    neuropathy BLE and Feet numbness   Paroxysmal atrial fibrillation (HCC) 11/20/2020   PONV (postoperative nausea and vomiting)    Tremor    in hands and down left leg   Wears partial dentures    upper    Past Surgical History:  Procedure Laterality Date   ABDOMINAL HYSTERECTOMY  yrs ago   ANTERIOR LAT LUMBAR FUSION N/A 11/21/2019   Procedure: Lumbar two-three Lumbar three-four Anterolateral lumbar interbody fusion with lateral plate;  Surgeon: Colon Shove, MD;  Location: [facility] OR;  Service: Neurosurgery;  Laterality: N/A;   BIOPSY  11/23/2023   Procedure: BIOPSY;  Surgeon: Saintclair Jasper, MD;  Location: [facility];  Service: Gastroenterology;;   BOTOX  INJECTION N/A 11/23/2023   Procedure: BOTOX  INJECTION;  Surgeon: Saintclair Jasper, MD;  Location: [facility];  Service: Gastroenterology;  Laterality: N/A;   BREAST EXCISIONAL BIOPSY Right 11/2016   ALH and papillomas   BREAST LUMPECTOMY Right 2017   BREAST LUMPECTOMY WITH RADIOACTIVE SEED LOCALIZATION Right 12/15/2016   Procedure: RADIOACTIVE SEED X 2 GUIDED EXCISIONAL BREAST BIOPSY;  Surgeon: Donnice Bury, MD;  Location: [facility];  Service: General;  Laterality: Right;   CARPAL TUNNEL RELEASE Bilateral yrs ago   x 2  cervical neck fusion  yrs ago   C 3 and C 4    COLONOSCOPY     ESOPHAGOGASTRODUODENOSCOPY (EGD) WITH PROPOFOL  N/A 11/23/2023   Procedure: ESOPHAGOGASTRODUODENOSCOPY (EGD) WITH PROPOFOL ;  Surgeon: Saintclair Jasper, MD;  Location: [facility];  Service: Gastroenterology;  Laterality: N/A;   ESOPHAGOGASTRODUODENOSCOPY ENDOSCOPY N/A 01/09/2022    Procedure: INTRAOPERATIVE ENDOSCOPY;  Surgeon: Rubin Calamity, MD;  Location: [facility] OR;  Service: General;  Laterality: N/A;   EYE SURGERY Bilateral    Cataracts removed   left knee arthroscopy   last done 2008   x 3   left shoulder arthroscopy  yrs ago   lower back surgery  2005   right total knee replacement   2008   TOTAL HIP ARTHROPLASTY Left 04/08/2023   Procedure: TOTAL HIP ARTHROPLASTY ANTERIOR APPROACH;  Surgeon: Fidel Rogue, MD;  Location: [facility];  Service: Orthopedics;  Laterality: Left;  150   TOTAL KNEE ARTHROPLASTY  01/14/2012   Procedure: TOTAL KNEE ARTHROPLASTY;  Surgeon: Reyes JAYSON Billing, MD;  Location: [facility];  Service: Orthopedics;  Laterality: Left;  preop femoral nerve block   UPPER GI ENDOSCOPY     WISDOM TOOTH EXTRACTION      Current Outpatient Medications  Medication Sig Dispense Refill Last Dose/Taking   acetaminophen  (TYLENOL ) 500 MG tablet Take 500-1,000 mg by mouth every 6 (six) hours as needed (pain.).      apixaban  (ELIQUIS ) 5 MG TABS tablet Take 1 tablet (5 mg total) by mouth 2 (two) times daily. (Patient taking differently: Take 5 mg by mouth every evening.) 60 tablet 5    Camphor-Menthol -Methyl Sal (SALONPAS) 3.11-28-08 % PTCH Place 1 patch onto the skin daily as needed (left hip pain).      Cholecalciferol (D3 PO) Take 1 tablet by mouth daily.      colchicine  0.6 MG tablet Take 0.6 mg by mouth as needed (gout flare).      Cyanocobalamin  (VITAMIN B-12 PO) Take 1 tablet by mouth in the morning.      diltiazem  (CARDIZEM ) 30 MG tablet Take 1 tablet every 4 hours AS NEEDED for heart rate >100 as long as blood pressure >100. 30 tablet 1    DULoxetine  (CYMBALTA ) 30 MG capsule Take 30 mg by mouth every evening. 30 mg + 60 mg=90 mg      DULoxetine  (CYMBALTA ) 60 MG capsule Take 60 mg by mouth every evening. 60 mg + 30 mg=90 mg      furosemide  (LASIX ) 20 MG tablet TAKE 1 TABLET BY MOUTH DAILY AS NEEDED 30 tablet 11    gabapentin  (NEURONTIN ) 600 MG tablet Take 600 mg by  mouth 3 (three) times daily.      hydrochlorothiazide  (HYDRODIURIL ) 25 MG tablet Take 1 tablet by mouth once daily 30 tablet 12    levothyroxine  (SYNTHROID ) 200 MCG tablet Take 200 mcg by mouth daily before breakfast.      methocarbamol  (ROBAXIN ) 500 MG tablet Take 500 mg by mouth 4 (four) times daily.      metoprolol  succinate (TOPROL -XL) 100 MG 24 hr tablet TAKE 1 TABLET BY MOUTH DAILY WITH OR IMMEDIATLY FOLLOWING A MEAL 90 tablet 3    oxyCODONE -acetaminophen  (PERCOCET) 7.5-325 MG tablet Take 1 tablet by mouth every 6 (six) hours as needed (pain.).      pantoprazole  (PROTONIX ) 40 MG tablet Take 40 mg by mouth 2 (two) times daily.      No current facility-administered medications for this visit.   No Known Allergies  Social History  Tobacco Use   Smoking status: Never   Smokeless tobacco: Never  Substance Use Topics   Alcohol  use: Not Currently    Comment: occasional wine    Family History  Problem Relation Age of Onset   Heart disease Father    Stroke Brother    Breast cancer Neg Hx      Review of Systems  Musculoskeletal:  Positive for arthralgias, back pain and gait problem.  All other systems reviewed and are negative.   Objective:  Physical Exam Constitutional:      Appearance: Normal appearance.  HENT:     Head: Normocephalic and atraumatic.     Nose: Nose normal.     Mouth/Throat:     Mouth: Mucous membranes are moist.     Pharynx: Oropharynx is clear.  Eyes:     Conjunctiva/sclera: Conjunctivae normal.  Cardiovascular:     Rate and Rhythm: Normal rate and regular rhythm.     Pulses: Normal pulses.     Heart sounds: Normal heart sounds.  Pulmonary:     Effort: Pulmonary effort is normal.     Breath sounds: Normal breath sounds.  Abdominal:     General: Abdomen is flat.     Palpations: Abdomen is soft.  Genitourinary:    Comments: Deferred. Musculoskeletal:     Cervical back: Normal range of motion and neck supple.     Comments: Examination of the  right hip reveals no skin wounds or lesions. Pain with flexion and rotation of the hip. Motion restriction noted. Trochanteric tenderness to palpation.   Sensory and motor function intact in LE bilaterally. Distal pedal pulses 2+ bilaterally.  No significant pedal edema. Calves soft and non-tender.  Skin:    General: Skin is warm and dry.     Capillary Refill: Capillary refill takes less than 2 seconds.  Neurological:     General: No focal deficit present.     Mental Status: She is alert and oriented to person, place, and time.  Psychiatric:        Mood and Affect: Mood normal.        Behavior: Behavior normal.        Thought Content: Thought content normal.        Judgment: Judgment normal.     Vital signs in last 24 hours: @VSRANGES @  Labs:   Estimated body mass index is 33.81 kg/m as calculated from the following:   Height as of 06/14/24: 5' 4 (1.626 m).   Weight as of 06/14/24: 89.4 kg.   Imaging Review Plain radiographs demonstrate severe degenerative joint disease of the right hip(s). The bone quality appears to be adequate for age and reported activity level.      Assessment/Plan:  End stage arthritis, right hip(s)  The patient history, physical examination, clinical judgement of the provider and imaging studies are consistent with end stage degenerative joint disease of the right hip(s) and total hip arthroplasty is deemed medically necessary. The treatment options including medical management, injection therapy, arthroscopy and arthroplasty were discussed at length. The risks and benefits of total hip arthroplasty were presented and reviewed. The risks due to aseptic loosening, infection, stiffness, dislocation/subluxation,  thromboembolic complications and other imponderables were discussed.  The patient acknowledged the explanation, agreed to proceed with the plan and consent was signed. Patient is being admitted for inpatient treatment for surgery, pain control,  PT, OT, prophylactic antibiotics, VTE prophylaxis, progressive ambulation and ADL's and discharge planning.The patient is planning to be discharged home with  HEP after an overnight stay.   Therapy Plans: HEP.  Disposition: Home with husband and daughter Planned DVT Prophylaxis: Eliquis  5mg  BID DME needed: Has rolling walker and cane.  PCP: Cleared.  Cardiology: Cleared. Hold Eliquis  3 days prior to surgery.  Hematology: Cleared.  TXA: IV Allergies: NDKA.  Anesthesia Concerns: Sometimes nauseated. Did not have issues last surgery. None.  BMI: 33.3 Last HgbA1c: Not diabetic Other: - A-fib, Eliquis , Need to stop 3 days prior to surgery date.  - Chronic low back pain. Oxycodone  7.5mg  3-4 times per day, gabapentin  600mg  TID, methocarbamol  BID/TID. Pain management [facility].  - No NSAIDS.  - Oxycodone  7.5mg , zofran .  - 05/10/24: Hgb 11.8. Hospital labs pending. Discussed hemoglobin has to be high enough for surgery. Supplementation iron, B12, vitamin D. - 06/14/24: Cr. 0.92, K+ 3.9, Hgb 12.8.  - Mupirocin.    Patient's anticipated LOS is less than 2 midnights, meeting these requirements: - Younger than 54 - Lives within 1 hour of care - Has a competent adult at home to recover with post-op recover - NO history of  - Chronic pain requiring opiods  - Diabetes  - Coronary Artery Disease  - Heart failure  - Heart attack  - Stroke  - DVT/VTE  - Cardiac arrhythmia  - Respiratory Failure/COPD  - Renal failure  - Anemia  - Advanced Liver disease

## 2024-06-16 NOTE — H&P (View-Only) (Signed)
 TOTAL HIP ADMISSION H&P  Patient is admitted for right total hip arthroplasty.  Subjective:  Chief Complaint: right hip pain  HPI: Kathryn [name], 68 y.o. female, has a history of pain and functional disability in the right hip(s) due to arthritis and patient has failed non-surgical conservative treatments for greater than 12 weeks to include NSAID's and/or analgesics, corticosteriod injections, flexibility and strengthening excercises, use of assistive devices, and activity modification.  Onset of symptoms was gradual starting 10 years ago with rapidlly worsening course since that time.The patient noted no past surgery on the right hip(s).  Patient currently rates pain in the right hip at 10 out of 10 with activity. Patient has night pain, worsening of pain with activity and weight bearing, trendelenberg gait, pain that interfers with activities of daily living, and pain with passive range of motion. Patient has evidence of subchondral cysts, subchondral sclerosis, periarticular osteophytes, and joint space narrowing by imaging studies. This condition presents safety issues increasing the risk of falls.  There is no current active infection.  Patient Active Problem List   Diagnosis Date Noted   Primary osteoarthritis of left hip 04/08/2023   Aortic atherosclerosis (HCC) 04/09/2022   Esophageal achalasia 01/09/2022   Chronic left-sided lumbar radiculopathy 03/27/2021   Lumbar radiculopathy, chronic 11/21/2019   Spondylolisthesis of lumbar region 07/04/2019   Spondylolisthesis at L4-L5 level 07/04/2019   Atypical lobular hyperplasia [facility]) of right breast 02/09/2017   Osteoarthritis of knee 01/15/2012   HYPOTHYROIDISM 07/26/2008   HYPERLIPIDEMIA 07/26/2008   HYPERTENSION 07/26/2008   DYSPNEA 07/26/2008   CARPAL TUNNEL SYNDROME, HX OF 07/26/2008   Past Medical History:  Diagnosis Date   Anemia    Anxiety    Arthritis    Breast cancer (HCC)    Right breast, had lumpectomy only no  chemo, radiation   Cancer (HCC) 10/2016   Breast right,  lumpectomy,  did not require chemo/xrt   DDD (degenerative disc disease), cervical    Depression    Dyspnea    Dysrhythmia    A Fib   Elevated cholesterol    GERD (gastroesophageal reflux disease)    H/O hiatal hernia    Headache    Hypertension    Hypothyroidism    Neuromuscular disorder (HCC)    neuropathy BLE and Feet numbness   Paroxysmal atrial fibrillation (HCC) 11/20/2020   PONV (postoperative nausea and vomiting)    Tremor    in hands and down left leg   Wears partial dentures    upper    Past Surgical History:  Procedure Laterality Date   ABDOMINAL HYSTERECTOMY  yrs ago   ANTERIOR LAT LUMBAR FUSION N/A 11/21/2019   Procedure: Lumbar two-three Lumbar three-four Anterolateral lumbar interbody fusion with lateral plate;  Surgeon: Colon Shove, MD;  Location: [facility] OR;  Service: Neurosurgery;  Laterality: N/A;   BIOPSY  11/23/2023   Procedure: BIOPSY;  Surgeon: Saintclair Jasper, MD;  Location: [facility];  Service: Gastroenterology;;   BOTOX  INJECTION N/A 11/23/2023   Procedure: BOTOX  INJECTION;  Surgeon: Saintclair Jasper, MD;  Location: [facility];  Service: Gastroenterology;  Laterality: N/A;   BREAST EXCISIONAL BIOPSY Right 11/2016   ALH and papillomas   BREAST LUMPECTOMY Right 2017   BREAST LUMPECTOMY WITH RADIOACTIVE SEED LOCALIZATION Right 12/15/2016   Procedure: RADIOACTIVE SEED X 2 GUIDED EXCISIONAL BREAST BIOPSY;  Surgeon: Donnice Bury, MD;  Location: [facility];  Service: General;  Laterality: Right;   CARPAL TUNNEL RELEASE Bilateral yrs ago   x 2  cervical neck fusion  yrs ago   C 3 and C 4    COLONOSCOPY     ESOPHAGOGASTRODUODENOSCOPY (EGD) WITH PROPOFOL  N/A 11/23/2023   Procedure: ESOPHAGOGASTRODUODENOSCOPY (EGD) WITH PROPOFOL ;  Surgeon: Saintclair Jasper, MD;  Location: [facility];  Service: Gastroenterology;  Laterality: N/A;   ESOPHAGOGASTRODUODENOSCOPY ENDOSCOPY N/A 01/09/2022    Procedure: INTRAOPERATIVE ENDOSCOPY;  Surgeon: Rubin Calamity, MD;  Location: [facility] OR;  Service: General;  Laterality: N/A;   EYE SURGERY Bilateral    Cataracts removed   left knee arthroscopy   last done 2008   x 3   left shoulder arthroscopy  yrs ago   lower back surgery  2005   right total knee replacement   2008   TOTAL HIP ARTHROPLASTY Left 04/08/2023   Procedure: TOTAL HIP ARTHROPLASTY ANTERIOR APPROACH;  Surgeon: Fidel Rogue, MD;  Location: [facility];  Service: Orthopedics;  Laterality: Left;  150   TOTAL KNEE ARTHROPLASTY  01/14/2012   Procedure: TOTAL KNEE ARTHROPLASTY;  Surgeon: Reyes JAYSON Billing, MD;  Location: [facility];  Service: Orthopedics;  Laterality: Left;  preop femoral nerve block   UPPER GI ENDOSCOPY     WISDOM TOOTH EXTRACTION      Current Outpatient Medications  Medication Sig Dispense Refill Last Dose/Taking   acetaminophen  (TYLENOL ) 500 MG tablet Take 500-1,000 mg by mouth every 6 (six) hours as needed (pain.).      apixaban  (ELIQUIS ) 5 MG TABS tablet Take 1 tablet (5 mg total) by mouth 2 (two) times daily. (Patient taking differently: Take 5 mg by mouth every evening.) 60 tablet 5    Camphor-Menthol -Methyl Sal (SALONPAS) 3.11-28-08 % PTCH Place 1 patch onto the skin daily as needed (left hip pain).      Cholecalciferol (D3 PO) Take 1 tablet by mouth daily.      colchicine  0.6 MG tablet Take 0.6 mg by mouth as needed (gout flare).      Cyanocobalamin  (VITAMIN B-12 PO) Take 1 tablet by mouth in the morning.      diltiazem  (CARDIZEM ) 30 MG tablet Take 1 tablet every 4 hours AS NEEDED for heart rate >100 as long as blood pressure >100. 30 tablet 1    DULoxetine  (CYMBALTA ) 30 MG capsule Take 30 mg by mouth every evening. 30 mg + 60 mg=90 mg      DULoxetine  (CYMBALTA ) 60 MG capsule Take 60 mg by mouth every evening. 60 mg + 30 mg=90 mg      furosemide  (LASIX ) 20 MG tablet TAKE 1 TABLET BY MOUTH DAILY AS NEEDED 30 tablet 11    gabapentin  (NEURONTIN ) 600 MG tablet Take 600 mg by  mouth 3 (three) times daily.      hydrochlorothiazide  (HYDRODIURIL ) 25 MG tablet Take 1 tablet by mouth once daily 30 tablet 12    levothyroxine  (SYNTHROID ) 200 MCG tablet Take 200 mcg by mouth daily before breakfast.      methocarbamol  (ROBAXIN ) 500 MG tablet Take 500 mg by mouth 4 (four) times daily.      metoprolol  succinate (TOPROL -XL) 100 MG 24 hr tablet TAKE 1 TABLET BY MOUTH DAILY WITH OR IMMEDIATLY FOLLOWING A MEAL 90 tablet 3    oxyCODONE -acetaminophen  (PERCOCET) 7.5-325 MG tablet Take 1 tablet by mouth every 6 (six) hours as needed (pain.).      pantoprazole  (PROTONIX ) 40 MG tablet Take 40 mg by mouth 2 (two) times daily.      No current facility-administered medications for this visit.   No Known Allergies  Social History  Tobacco Use   Smoking status: Never   Smokeless tobacco: Never  Substance Use Topics   Alcohol  use: Not Currently    Comment: occasional wine    Family History  Problem Relation Age of Onset   Heart disease Father    Stroke Brother    Breast cancer Neg Hx      Review of Systems  Musculoskeletal:  Positive for arthralgias, back pain and gait problem.  All other systems reviewed and are negative.   Objective:  Physical Exam Constitutional:      Appearance: Normal appearance.  HENT:     Head: Normocephalic and atraumatic.     Nose: Nose normal.     Mouth/Throat:     Mouth: Mucous membranes are moist.     Pharynx: Oropharynx is clear.  Eyes:     Conjunctiva/sclera: Conjunctivae normal.  Cardiovascular:     Rate and Rhythm: Normal rate and regular rhythm.     Pulses: Normal pulses.     Heart sounds: Normal heart sounds.  Pulmonary:     Effort: Pulmonary effort is normal.     Breath sounds: Normal breath sounds.  Abdominal:     General: Abdomen is flat.     Palpations: Abdomen is soft.  Genitourinary:    Comments: Deferred. Musculoskeletal:     Cervical back: Normal range of motion and neck supple.     Comments: Examination of the  right hip reveals no skin wounds or lesions. Pain with flexion and rotation of the hip. Motion restriction noted. Trochanteric tenderness to palpation.   Sensory and motor function intact in LE bilaterally. Distal pedal pulses 2+ bilaterally.  No significant pedal edema. Calves soft and non-tender.  Skin:    General: Skin is warm and dry.     Capillary Refill: Capillary refill takes less than 2 seconds.  Neurological:     General: No focal deficit present.     Mental Status: She is alert and oriented to person, place, and time.  Psychiatric:        Mood and Affect: Mood normal.        Behavior: Behavior normal.        Thought Content: Thought content normal.        Judgment: Judgment normal.     Vital signs in last 24 hours: @VSRANGES @  Labs:   Estimated body mass index is 33.81 kg/m as calculated from the following:   Height as of 06/14/24: 5' 4 (1.626 m).   Weight as of 06/14/24: 89.4 kg.   Imaging Review Plain radiographs demonstrate severe degenerative joint disease of the right hip(s). The bone quality appears to be adequate for age and reported activity level.      Assessment/Plan:  End stage arthritis, right hip(s)  The patient history, physical examination, clinical judgement of the provider and imaging studies are consistent with end stage degenerative joint disease of the right hip(s) and total hip arthroplasty is deemed medically necessary. The treatment options including medical management, injection therapy, arthroscopy and arthroplasty were discussed at length. The risks and benefits of total hip arthroplasty were presented and reviewed. The risks due to aseptic loosening, infection, stiffness, dislocation/subluxation,  thromboembolic complications and other imponderables were discussed.  The patient acknowledged the explanation, agreed to proceed with the plan and consent was signed. Patient is being admitted for inpatient treatment for surgery, pain control,  PT, OT, prophylactic antibiotics, VTE prophylaxis, progressive ambulation and ADL's and discharge planning.The patient is planning to be discharged home with  HEP after an overnight stay.   Therapy Plans: HEP.  Disposition: Home with husband and daughter Planned DVT Prophylaxis: Eliquis  5mg  BID DME needed: Has rolling walker and cane.  PCP: Cleared.  Cardiology: Cleared. Hold Eliquis  3 days prior to surgery.  Hematology: Cleared.  TXA: IV Allergies: NDKA.  Anesthesia Concerns: Sometimes nauseated. Did not have issues last surgery. None.  BMI: 33.3 Last HgbA1c: Not diabetic Other: - A-fib, Eliquis , Need to stop 3 days prior to surgery date.  - Chronic low back pain. Oxycodone  7.5mg  3-4 times per day, gabapentin  600mg  TID, methocarbamol  BID/TID. Pain management [facility].  - No NSAIDS.  - Oxycodone  7.5mg , zofran .  - 05/10/24: Hgb 11.8. Hospital labs pending. Discussed hemoglobin has to be high enough for surgery. Supplementation iron, B12, vitamin D. - 06/14/24: Cr. 0.92, K+ 3.9, Hgb 12.8.  - Mupirocin.    Patient's anticipated LOS is less than 2 midnights, meeting these requirements: - Younger than 54 - Lives within 1 hour of care - Has a competent adult at home to recover with post-op recover - NO history of  - Chronic pain requiring opiods  - Diabetes  - Coronary Artery Disease  - Heart failure  - Heart attack  - Stroke  - DVT/VTE  - Cardiac arrhythmia  - Respiratory Failure/COPD  - Renal failure  - Anemia  - Advanced Liver disease

## 2024-06-20 DIAGNOSIS — G894 Chronic pain syndrome: Secondary | ICD-10-CM | POA: Diagnosis not present

## 2024-06-20 DIAGNOSIS — F1120 Opioid dependence, uncomplicated: Secondary | ICD-10-CM | POA: Diagnosis not present

## 2024-06-21 ENCOUNTER — Ambulatory Visit (HOSPITAL_COMMUNITY): Payer: Self-pay | Admitting: Medical

## 2024-06-21 ENCOUNTER — Ambulatory Visit (HOSPITAL_COMMUNITY)

## 2024-06-21 ENCOUNTER — Ambulatory Visit (HOSPITAL_COMMUNITY)
Admission: RE | Admit: 2024-06-21 | Discharge: 2024-06-22 | Disposition: A | Discharge status: Home / Self Care | Attending: Orthopedic Surgery | Admitting: Orthopedic Surgery

## 2024-06-21 ENCOUNTER — Ambulatory Visit (HOSPITAL_BASED_OUTPATIENT_CLINIC_OR_DEPARTMENT_OTHER): Payer: Self-pay | Admitting: Anesthesiology

## 2024-06-21 ENCOUNTER — Encounter (HOSPITAL_COMMUNITY): Payer: Self-pay | Admitting: Orthopedic Surgery

## 2024-06-21 ENCOUNTER — Encounter (HOSPITAL_COMMUNITY)
Admission: RE | Disposition: A | Discharge status: Home / Self Care | Payer: Self-pay | Source: Home / Self Care | Attending: Orthopedic Surgery

## 2024-06-21 ENCOUNTER — Other Ambulatory Visit: Payer: Self-pay

## 2024-06-21 DIAGNOSIS — M25751 Osteophyte, right hip: Secondary | ICD-10-CM | POA: Diagnosis not present

## 2024-06-21 DIAGNOSIS — K219 Gastro-esophageal reflux disease without esophagitis: Secondary | ICD-10-CM | POA: Diagnosis not present

## 2024-06-21 DIAGNOSIS — M1611 Unilateral primary osteoarthritis, right hip: Secondary | ICD-10-CM

## 2024-06-21 DIAGNOSIS — Z96641 Presence of right artificial hip joint: Secondary | ICD-10-CM | POA: Diagnosis not present

## 2024-06-21 DIAGNOSIS — Z79899 Other long term (current) drug therapy: Secondary | ICD-10-CM | POA: Diagnosis not present

## 2024-06-21 DIAGNOSIS — K449 Diaphragmatic hernia without obstruction or gangrene: Secondary | ICD-10-CM | POA: Diagnosis not present

## 2024-06-21 DIAGNOSIS — M1612 Unilateral primary osteoarthritis, left hip: Secondary | ICD-10-CM

## 2024-06-21 DIAGNOSIS — F418 Other specified anxiety disorders: Secondary | ICD-10-CM | POA: Insufficient documentation

## 2024-06-21 DIAGNOSIS — E039 Hypothyroidism, unspecified: Secondary | ICD-10-CM | POA: Insufficient documentation

## 2024-06-21 DIAGNOSIS — I480 Paroxysmal atrial fibrillation: Secondary | ICD-10-CM | POA: Diagnosis not present

## 2024-06-21 DIAGNOSIS — Z7901 Long term (current) use of anticoagulants: Secondary | ICD-10-CM | POA: Diagnosis not present

## 2024-06-21 DIAGNOSIS — Z8249 Family history of ischemic heart disease and other diseases of the circulatory system: Secondary | ICD-10-CM | POA: Insufficient documentation

## 2024-06-21 DIAGNOSIS — I10 Essential (primary) hypertension: Secondary | ICD-10-CM | POA: Insufficient documentation

## 2024-06-21 DIAGNOSIS — Z471 Aftercare following joint replacement surgery: Secondary | ICD-10-CM | POA: Diagnosis not present

## 2024-06-21 DIAGNOSIS — N6091 Unspecified benign mammary dysplasia of right breast: Secondary | ICD-10-CM

## 2024-06-21 HISTORY — PX: TOTAL HIP ARTHROPLASTY: SHX124

## 2024-06-21 LAB — TYPE AND SCREEN
ABO/RH(D): A NEG
Antibody Screen: NEGATIVE

## 2024-06-21 SURGERY — ARTHROPLASTY, HIP, TOTAL, ANTERIOR APPROACH
Anesthesia: General | Site: Hip | Laterality: Right

## 2024-06-21 MED ORDER — PROPOFOL 1000 MG/100ML IV EMUL
INTRAVENOUS | Status: AC
Start: 2024-06-21 — End: 2024-06-21
  Filled 2024-06-21: qty 100

## 2024-06-21 MED ORDER — OXYCODONE HCL 5 MG PO TABS
5.0000 mg | ORAL_TABLET | ORAL | Status: DC | PRN
  Administered 2024-06-21 (×2): 5 mg via ORAL
  Administered 2024-06-22 (×2): 10 mg via ORAL
  Filled 2024-06-21: qty 1
  Filled 2024-06-21: qty 2
  Filled 2024-06-21: qty 1

## 2024-06-21 MED ORDER — MUPIROCIN 2 % EX OINT
TOPICAL_OINTMENT | Freq: Two times a day (BID) | CUTANEOUS | Status: DC
  Filled 2024-06-21: qty 22

## 2024-06-21 MED ORDER — PHENOL 1.4 % MT LIQD: 1.0000 | OROMUCOSAL | Status: DC | PRN

## 2024-06-21 MED ORDER — ALUM & MAG HYDROXIDE-SIMETH 200-200-20 MG/5ML PO SUSP: 30.0000 mL | ORAL | Status: DC | PRN

## 2024-06-21 MED ORDER — PHENYLEPHRINE HCL (PRESSORS) 10 MG/ML IV SOLN
INTRAVENOUS | Status: AC
  Filled 2024-06-21: qty 1

## 2024-06-21 MED ORDER — ONDANSETRON HCL 4 MG PO TABS: 4.0000 mg | ORAL_TABLET | Freq: Four times a day (QID) | ORAL | Status: DC | PRN

## 2024-06-21 MED ORDER — METHOCARBAMOL 1000 MG/10ML IJ SOLN
500.0000 mg | Freq: Four times a day (QID) | INTRAMUSCULAR | Status: DC | PRN
  Administered 2024-06-21: 500 mg via INTRAVENOUS

## 2024-06-21 MED ORDER — METHOCARBAMOL 500 MG PO TABS
500.0000 mg | ORAL_TABLET | Freq: Four times a day (QID) | ORAL | Status: DC | PRN
  Administered 2024-06-21 – 2024-06-22 (×2): 500 mg via ORAL
  Filled 2024-06-21 (×2): qty 1

## 2024-06-21 MED ORDER — ACETAMINOPHEN 325 MG PO TABS
650.0000 mg | ORAL_TABLET | ORAL | Status: AC
  Administered 2024-06-21: 650 mg via ORAL

## 2024-06-21 MED ORDER — DOCUSATE SODIUM 100 MG PO CAPS
100.0000 mg | ORAL_CAPSULE | Freq: Two times a day (BID) | ORAL | Status: DC
  Administered 2024-06-21 – 2024-06-22 (×3): 100 mg via ORAL
  Filled 2024-06-21 (×3): qty 1

## 2024-06-21 MED ORDER — MIDAZOLAM HCL 2 MG/2ML IJ SOLN
INTRAMUSCULAR | Status: AC
  Filled 2024-06-21: qty 2

## 2024-06-21 MED ORDER — HYDROMORPHONE HCL 1 MG/ML IJ SOLN: 0.5000 mg | INTRAMUSCULAR | Status: DC | PRN

## 2024-06-21 MED ORDER — MUPIROCIN 2 % EX OINT: 1.0000 | TOPICAL_OINTMENT | Freq: Two times a day (BID) | CUTANEOUS | 0 refills | Status: AC

## 2024-06-21 MED ORDER — ISOPROPYL ALCOHOL 70 % SOLN
Status: DC | PRN
  Administered 2024-06-21: 1 via TOPICAL

## 2024-06-21 MED ORDER — PROPOFOL 1000 MG/100ML IV EMUL
INTRAVENOUS | Status: AC
  Filled 2024-06-21: qty 100

## 2024-06-21 MED ORDER — CEFAZOLIN SODIUM-DEXTROSE 2-4 GM/100ML-% IV SOLN
2.0000 g | INTRAVENOUS | Status: AC
  Administered 2024-06-21: 2 g via INTRAVENOUS
  Filled 2024-06-21: qty 100

## 2024-06-21 MED ORDER — 0.9 % SODIUM CHLORIDE (POUR BTL) OPTIME
TOPICAL | Status: DC | PRN
  Administered 2024-06-21: 1000 mL

## 2024-06-21 MED ORDER — ACETAMINOPHEN 325 MG PO TABS
ORAL_TABLET | ORAL | Status: AC
  Filled 2024-06-21: qty 2

## 2024-06-21 MED ORDER — ROCURONIUM BROMIDE 100 MG/10ML IV SOLN
INTRAVENOUS | Status: DC | PRN
  Administered 2024-06-21: 30 mg via INTRAVENOUS
  Administered 2024-06-21: 10 mg via INTRAVENOUS

## 2024-06-21 MED ORDER — POVIDONE-IODINE 10 % EX SWAB: 2.0000 | Freq: Once | CUTANEOUS | Status: DC

## 2024-06-21 MED ORDER — BISACODYL 10 MG RE SUPP: 10.0000 mg | Freq: Every day | RECTAL | Status: DC | PRN

## 2024-06-21 MED ORDER — MENTHOL 3 MG MT LOZG: 1.0000 | LOZENGE | OROMUCOSAL | Status: DC | PRN

## 2024-06-21 MED ORDER — ONDANSETRON HCL 4 MG/2ML IJ SOLN: 4.0000 mg | Freq: Four times a day (QID) | INTRAMUSCULAR | Status: DC | PRN

## 2024-06-21 MED ORDER — LACTATED RINGERS IV SOLN: INTRAVENOUS | Status: DC

## 2024-06-21 MED ORDER — ORAL CARE MOUTH RINSE: 15.0000 mL | Freq: Once | OROMUCOSAL | Status: AC

## 2024-06-21 MED ORDER — PROPOFOL 10 MG/ML IV BOLUS
INTRAVENOUS | Status: DC | PRN
  Administered 2024-06-21: 140 mg via INTRAVENOUS

## 2024-06-21 MED ORDER — PHENYLEPHRINE HCL (PRESSORS) 10 MG/ML IV SOLN
INTRAVENOUS | Status: DC | PRN
  Administered 2024-06-21: 160 ug via INTRAVENOUS

## 2024-06-21 MED ORDER — MIDAZOLAM HCL 5 MG/5ML IJ SOLN
INTRAMUSCULAR | Status: DC | PRN
  Administered 2024-06-21: 1 mg via INTRAVENOUS

## 2024-06-21 MED ORDER — DILTIAZEM HCL 30 MG PO TABS: 30.0000 mg | ORAL_TABLET | ORAL | Status: DC | PRN

## 2024-06-21 MED ORDER — KETOROLAC TROMETHAMINE 30 MG/ML IJ SOLN
INTRAMUSCULAR | Status: AC
  Filled 2024-06-21: qty 1

## 2024-06-21 MED ORDER — SODIUM CHLORIDE 0.9 % IV SOLN: INTRAVENOUS | Status: DC

## 2024-06-21 MED ORDER — METHOCARBAMOL 1000 MG/10ML IJ SOLN
INTRAMUSCULAR | Status: AC
  Filled 2024-06-21: qty 10

## 2024-06-21 MED ORDER — SODIUM CHLORIDE 0.9 % IR SOLN
Status: DC | PRN
  Administered 2024-06-21: 1000 mL

## 2024-06-21 MED ORDER — FUROSEMIDE 20 MG PO TABS: 20.0000 mg | ORAL_TABLET | Freq: Every day | ORAL | Status: DC | PRN

## 2024-06-21 MED ORDER — DULOXETINE HCL 30 MG PO CPEP
30.0000 mg | ORAL_CAPSULE | Freq: Every evening | ORAL | Status: DC
  Administered 2024-06-21: 30 mg via ORAL
  Filled 2024-06-21: qty 1

## 2024-06-21 MED ORDER — ONDANSETRON HCL 4 MG/2ML IJ SOLN
INTRAMUSCULAR | Status: AC
  Filled 2024-06-21: qty 2

## 2024-06-21 MED ORDER — DROPERIDOL 2.5 MG/ML IJ SOLN
INTRAMUSCULAR | Status: AC
  Filled 2024-06-21: qty 2

## 2024-06-21 MED ORDER — METOPROLOL SUCCINATE ER 50 MG PO TB24
100.0000 mg | ORAL_TABLET | Freq: Every day | ORAL | Status: DC
  Administered 2024-06-22: 100 mg via ORAL
  Filled 2024-06-21: qty 2

## 2024-06-21 MED ORDER — POVIDONE-IODINE 10 % EX SWAB
2.0000 | Freq: Once | CUTANEOUS | Status: AC
  Administered 2024-06-21: 2 via TOPICAL

## 2024-06-21 MED ORDER — CHLORHEXIDINE GLUCONATE 4 % EX SOLN: 1.0000 | CUTANEOUS | 1 refills | Status: DC

## 2024-06-21 MED ORDER — FENTANYL CITRATE (PF) 100 MCG/2ML IJ SOLN
INTRAMUSCULAR | Status: DC | PRN
  Administered 2024-06-21 (×2): 50 ug via INTRAVENOUS

## 2024-06-21 MED ORDER — DULOXETINE HCL 60 MG PO CPEP
60.0000 mg | ORAL_CAPSULE | Freq: Every evening | ORAL | Status: DC
  Administered 2024-06-21: 60 mg via ORAL
  Filled 2024-06-21 (×2): qty 1

## 2024-06-21 MED ORDER — ISOPROPYL ALCOHOL 70 % SOLN
Status: AC
  Filled 2024-06-21: qty 480

## 2024-06-21 MED ORDER — APIXABAN 2.5 MG PO TABS
2.5000 mg | ORAL_TABLET | Freq: Two times a day (BID) | ORAL | Status: DC
  Administered 2024-06-22: 2.5 mg via ORAL
  Filled 2024-06-21: qty 1

## 2024-06-21 MED ORDER — SENNA 8.6 MG PO TABS
1.0000 | ORAL_TABLET | Freq: Two times a day (BID) | ORAL | Status: DC
  Administered 2024-06-21 – 2024-06-22 (×3): 8.6 mg via ORAL
  Filled 2024-06-21 (×3): qty 1

## 2024-06-21 MED ORDER — PHENYLEPHRINE HCL (PRESSORS) 10 MG/ML IV SOLN
INTRAVENOUS | Status: AC
Start: 2024-06-21 — End: 2024-06-21
  Filled 2024-06-21: qty 1

## 2024-06-21 MED ORDER — DEXAMETHASONE SODIUM PHOSPHATE 10 MG/ML IJ SOLN
INTRAMUSCULAR | Status: DC | PRN
  Administered 2024-06-21: 8 mg via INTRAVENOUS

## 2024-06-21 MED ORDER — ACETAMINOPHEN 500 MG PO TABS
1000.0000 mg | ORAL_TABLET | Freq: Four times a day (QID) | ORAL | Status: DC
  Administered 2024-06-21 – 2024-06-22 (×3): 1000 mg via ORAL
  Filled 2024-06-21 (×3): qty 2

## 2024-06-21 MED ORDER — OXYCODONE HCL 5 MG/5ML PO SOLN: 5.0000 mg | Freq: Once | ORAL | Status: DC | PRN

## 2024-06-21 MED ORDER — PROPOFOL 500 MG/50ML IV EMUL
INTRAVENOUS | Status: DC | PRN
  Administered 2024-06-21: 150 ug/kg/min via INTRAVENOUS

## 2024-06-21 MED ORDER — DROPERIDOL 2.5 MG/ML IJ SOLN
0.6250 mg | Freq: Once | INTRAMUSCULAR | Status: AC | PRN
  Administered 2024-06-21: 0.625 mg via INTRAVENOUS

## 2024-06-21 MED ORDER — ACETAMINOPHEN 500 MG PO TABS
1000.0000 mg | ORAL_TABLET | Freq: Once | ORAL | Status: DC
  Filled 2024-06-21: qty 2

## 2024-06-21 MED ORDER — ONDANSETRON HCL 4 MG/2ML IJ SOLN
INTRAMUSCULAR | Status: DC | PRN
  Administered 2024-06-21: 4 mg via INTRAVENOUS

## 2024-06-21 MED ORDER — CHLORHEXIDINE GLUCONATE 0.12 % MT SOLN
15.0000 mL | Freq: Once | OROMUCOSAL | Status: AC
  Administered 2024-06-21: 15 mL via OROMUCOSAL

## 2024-06-21 MED ORDER — OXYCODONE HCL 5 MG PO TABS: 5.0000 mg | ORAL_TABLET | Freq: Once | ORAL | Status: DC | PRN

## 2024-06-21 MED ORDER — LIDOCAINE HCL (PF) 2 % IJ SOLN
INTRAMUSCULAR | Status: AC
  Filled 2024-06-21: qty 5

## 2024-06-21 MED ORDER — PANTOPRAZOLE SODIUM 40 MG PO TBEC
40.0000 mg | DELAYED_RELEASE_TABLET | Freq: Two times a day (BID) | ORAL | Status: DC
  Administered 2024-06-21 – 2024-06-22 (×2): 40 mg via ORAL
  Filled 2024-06-21 (×2): qty 1

## 2024-06-21 MED ORDER — FERROUS SULFATE 325 (65 FE) MG PO TABS
325.0000 mg | ORAL_TABLET | Freq: Three times a day (TID) | ORAL | Status: DC
  Administered 2024-06-21 – 2024-06-22 (×2): 325 mg via ORAL
  Filled 2024-06-21 (×2): qty 1

## 2024-06-21 MED ORDER — FENTANYL CITRATE PF 50 MCG/ML IJ SOSY
PREFILLED_SYRINGE | INTRAMUSCULAR | Status: AC
  Filled 2024-06-21: qty 2

## 2024-06-21 MED ORDER — LEVOTHYROXINE SODIUM 100 MCG PO TABS
200.0000 ug | ORAL_TABLET | Freq: Every day | ORAL | Status: DC
  Administered 2024-06-22: 200 ug via ORAL
  Filled 2024-06-21: qty 2

## 2024-06-21 MED ORDER — LIDOCAINE HCL (CARDIAC) PF 100 MG/5ML IV SOSY
PREFILLED_SYRINGE | INTRAVENOUS | Status: DC | PRN
  Administered 2024-06-21: 100 mg via INTRAVENOUS

## 2024-06-21 MED ORDER — POLYETHYLENE GLYCOL 3350 17 G PO PACK: 17.0000 g | PACK | Freq: Every day | ORAL | Status: DC | PRN

## 2024-06-21 MED ORDER — ACETAMINOPHEN 500 MG PO TABS: 1000.0000 mg | ORAL_TABLET | Freq: Once | ORAL | Status: DC

## 2024-06-21 MED ORDER — SUGAMMADEX SODIUM 200 MG/2ML IV SOLN
INTRAVENOUS | Status: DC | PRN
  Administered 2024-06-21: 200 mg via INTRAVENOUS

## 2024-06-21 MED ORDER — ACETAMINOPHEN 325 MG PO TABS: 325.0000 mg | ORAL_TABLET | Freq: Four times a day (QID) | ORAL | Status: DC | PRN

## 2024-06-21 MED ORDER — GABAPENTIN 300 MG PO CAPS
600.0000 mg | ORAL_CAPSULE | Freq: Three times a day (TID) | ORAL | Status: DC
  Administered 2024-06-21 – 2024-06-22 (×3): 600 mg via ORAL
  Filled 2024-06-21 (×3): qty 2

## 2024-06-21 MED ORDER — KETOROLAC TROMETHAMINE 30 MG/ML IJ SOLN
INTRAMUSCULAR | Status: DC | PRN
  Administered 2024-06-21: 30 mg

## 2024-06-21 MED ORDER — TRANEXAMIC ACID-NACL 1000-0.7 MG/100ML-% IV SOLN
1000.0000 mg | INTRAVENOUS | Status: AC
  Administered 2024-06-21: 1000 mg via INTRAVENOUS
  Filled 2024-06-21: qty 100

## 2024-06-21 MED ORDER — DEXAMETHASONE SODIUM PHOSPHATE 10 MG/ML IJ SOLN
INTRAMUSCULAR | Status: AC
  Filled 2024-06-21: qty 1

## 2024-06-21 MED ORDER — SODIUM CHLORIDE (PF) 0.9 % IJ SOLN
INTRAMUSCULAR | Status: AC
  Filled 2024-06-21: qty 30

## 2024-06-21 MED ORDER — PHENYLEPHRINE HCL-NACL 20-0.9 MG/250ML-% IV SOLN
INTRAVENOUS | Status: DC | PRN
  Administered 2024-06-21: 50 ug/min via INTRAVENOUS

## 2024-06-21 MED ORDER — CEFAZOLIN SODIUM-DEXTROSE 2-4 GM/100ML-% IV SOLN
2.0000 g | Freq: Four times a day (QID) | INTRAVENOUS | Status: AC
  Administered 2024-06-21 (×2): 2 g via INTRAVENOUS
  Filled 2024-06-21 (×2): qty 100

## 2024-06-21 MED ORDER — METOCLOPRAMIDE HCL 5 MG PO TABS: 5.0000 mg | ORAL_TABLET | Freq: Three times a day (TID) | ORAL | Status: DC | PRN

## 2024-06-21 MED ORDER — BUPIVACAINE-EPINEPHRINE 0.25% -1:200000 IJ SOLN
INTRAMUSCULAR | Status: DC | PRN
  Administered 2024-06-21: 30 mL

## 2024-06-21 MED ORDER — OXYCODONE HCL 5 MG PO TABS
10.0000 mg | ORAL_TABLET | ORAL | Status: DC | PRN
  Filled 2024-06-21: qty 2

## 2024-06-21 MED ORDER — FENTANYL CITRATE PF 50 MCG/ML IJ SOSY
25.0000 ug | PREFILLED_SYRINGE | INTRAMUSCULAR | Status: DC | PRN
  Administered 2024-06-21 (×2): 50 ug via INTRAVENOUS

## 2024-06-21 MED ORDER — COLCHICINE 0.6 MG PO TABS: 0.6000 mg | ORAL_TABLET | ORAL | Status: DC | PRN

## 2024-06-21 MED ORDER — METOCLOPRAMIDE HCL 5 MG/ML IJ SOLN: 5.0000 mg | Freq: Three times a day (TID) | INTRAMUSCULAR | Status: DC | PRN

## 2024-06-21 MED ORDER — BUPIVACAINE-EPINEPHRINE (PF) 0.25% -1:200000 IJ SOLN
INTRAMUSCULAR | Status: AC
  Filled 2024-06-21: qty 30

## 2024-06-21 MED ORDER — DIPHENHYDRAMINE HCL 12.5 MG/5ML PO ELIX: 12.5000 mg | ORAL_SOLUTION | ORAL | Status: DC | PRN

## 2024-06-21 MED ORDER — SUCCINYLCHOLINE CHLORIDE 200 MG/10ML IV SOSY
PREFILLED_SYRINGE | INTRAVENOUS | Status: DC | PRN
  Administered 2024-06-21: 120 mg via INTRAVENOUS

## 2024-06-21 MED ORDER — SODIUM CHLORIDE (PF) 0.9 % IJ SOLN
INTRAMUSCULAR | Status: DC | PRN
  Administered 2024-06-21: 30 mL
  Administered 2024-06-21: 250 mL

## 2024-06-21 MED ORDER — FENTANYL CITRATE (PF) 100 MCG/2ML IJ SOLN
INTRAMUSCULAR | Status: AC
  Filled 2024-06-21: qty 2

## 2024-06-21 SURGICAL SUPPLY — 47 items
BAG COUNTER SPONGE SURGICOUNT (BAG) IMPLANT
BAG ZIPLOCK 12X15 (MISCELLANEOUS) IMPLANT
BLADE SAW RECIPROCATING 77.5 (BLADE) ×2 IMPLANT
CHLORAPREP W/TINT 26 (MISCELLANEOUS) ×2 IMPLANT
COVER PERINEAL POST (MISCELLANEOUS) ×2 IMPLANT
COVER SURGICAL LIGHT HANDLE (MISCELLANEOUS) ×2 IMPLANT
DERMABOND ADVANCED .7 DNX12 (GAUZE/BANDAGES/DRESSINGS) ×4 IMPLANT
DRAPE IMP U-DRAPE 54X76 (DRAPES) ×2 IMPLANT
DRAPE SHEET LG 3/4 BI-LAMINATE (DRAPES) ×6 IMPLANT
DRAPE STERI IOBAN 125X83 (DRAPES) ×2 IMPLANT
DRAPE U-SHAPE 47X51 STRL (DRAPES) ×4 IMPLANT
DRSG AQUACEL AG ADV 3.5X10 (GAUZE/BANDAGES/DRESSINGS) ×2 IMPLANT
ELECT REM PT RETURN 15FT ADLT (MISCELLANEOUS) ×2 IMPLANT
GAUZE SPONGE 4X4 12PLY STRL (GAUZE/BANDAGES/DRESSINGS) ×2 IMPLANT
GLOVE BIO SURGEON STRL SZ7 (GLOVE) ×2 IMPLANT
GLOVE BIO SURGEON STRL SZ8.5 (GLOVE) ×4 IMPLANT
GLOVE BIOGEL PI IND STRL 7.5 (GLOVE) ×2 IMPLANT
GLOVE BIOGEL PI IND STRL 8.5 (GLOVE) ×2 IMPLANT
GOWN SPEC L3 XXLG W/TWL (GOWN DISPOSABLE) ×2 IMPLANT
GOWN STRL REUS W/ TWL XL LVL3 (GOWN DISPOSABLE) ×2 IMPLANT
HEAD CERAMIC BIOLOX 36 T1 STD (Head) IMPLANT
HOLDER FOLEY CATH W/STRAP (MISCELLANEOUS) ×2 IMPLANT
HOOD PEEL AWAY T7 (MISCELLANEOUS) ×6 IMPLANT
KIT TURNOVER KIT A (KITS) ×2 IMPLANT
LINER ACETAB ~~LOC~~ D 36 (Liner) IMPLANT
MANIFOLD NEPTUNE II (INSTRUMENTS) ×2 IMPLANT
MARKER SKIN DUAL TIP RULER LAB (MISCELLANEOUS) ×2 IMPLANT
NDL SAFETY ECLIPSE 18X1.5 (NEEDLE) ×2 IMPLANT
NDL SPNL 18GX3.5 QUINCKE PK (NEEDLE) ×2 IMPLANT
NEEDLE SPNL 18GX3.5 QUINCKE PK (NEEDLE) ×1 IMPLANT
PACK ANTERIOR HIP CUSTOM (KITS) ×2 IMPLANT
PENCIL SMOKE EVACUATOR (MISCELLANEOUS) ×2 IMPLANT
SEALER BIPOLAR AQUA 6.0 (INSTRUMENTS) ×2 IMPLANT
SET HNDPC FAN SPRY TIP SCT (DISPOSABLE) ×2 IMPLANT
SHELL ACET G7 3H 50 SZD (Shell) IMPLANT
SOLUTION PRONTOSAN WOUND 350ML (IRRIGATION / IRRIGATOR) ×2 IMPLANT
SPIKE FLUID TRANSFER (MISCELLANEOUS) ×2 IMPLANT
STEM FEM REDUCE DISTAL 11X107 (Stem) IMPLANT
SUT MNCRL AB 3-0 PS2 18 (SUTURE) ×2 IMPLANT
SUT MON AB 2-0 CT1 36 (SUTURE) ×2 IMPLANT
SUT STRATAFIX 14 PDO 48 VLT (SUTURE) ×2 IMPLANT
SUT VIC AB 2-0 CT1 TAPERPNT 27 (SUTURE) IMPLANT
SYR 3ML LL SCALE MARK (SYRINGE) ×2 IMPLANT
TOWEL GREEN STERILE FF (TOWEL DISPOSABLE) ×2 IMPLANT
TRAY FOLEY MTR SLVR 16FR STAT (SET/KITS/TRAYS/PACK) IMPLANT
TUBE SUCTION HIGH CAP CLEAR NV (SUCTIONS) ×2 IMPLANT
WATER STERILE IRR 1000ML POUR (IV SOLUTION) ×2 IMPLANT

## 2024-06-21 NOTE — Interval H&P Note (Signed)
 History and Physical Interval Note:  06/21/2024 8:20 AM  Kathryn [name]  has presented today for surgery, with the diagnosis of Right hip osteoarthritis.  The various methods of treatment have been discussed with the patient and family. After consideration of risks, benefits and other options for treatment, the patient has consented to  Procedure(s): ARTHROPLASTY, HIP, TOTAL, ANTERIOR APPROACH (Right) as a surgical intervention.  The patient's history has been reviewed, patient examined, no change in status, stable for surgery.  I have reviewed the patient's chart and labs.  Questions were answered to the patient's satisfaction.     Redell PARAS Elizabth Palka

## 2024-06-21 NOTE — Plan of Care (Signed)
  Problem: Activity: Goal: Risk for activity intolerance will decrease Outcome: Progressing   Problem: Safety: Goal: Ability to remain free from injury will improve Outcome: Progressing   Problem: Pain Management: Goal: Pain level will decrease with appropriate interventions Outcome: Progressing

## 2024-06-21 NOTE — Discharge Instructions (Signed)

## 2024-06-21 NOTE — Anesthesia Procedure Notes (Signed)
 Procedure Name: Intubation Date/Time: 06/21/2024 10:11 AM  Performed by: Erick Fitz, CRNAPre-anesthesia Checklist: Patient identified, Emergency Drugs available, Suction available, Patient being monitored and Timeout performed Patient Re-evaluated:Patient Re-evaluated prior to induction Oxygen Delivery Method: Circle system utilized Preoxygenation: Pre-oxygenation with 100% oxygen Induction Type: Rapid sequence, IV induction and Cricoid Pressure applied Laryngoscope Size: Mac and 3 Grade View: Grade II Tube type: Oral Tube size: 7.0 mm Number of attempts: 1 Airway Equipment and Method: Stylet Placement Confirmation: ETT inserted through vocal cords under direct vision, positive ETCO2, CO2 detector and breath sounds checked- equal and bilateral Secured at: 22 cm Tube secured with: Tape (secured with pink Hy-tape) Dental Injury: Teeth and Oropharynx as per pre-operative assessment  Comments: RSI ; Dr. Paul present for induction, RSI

## 2024-06-21 NOTE — Anesthesia Postprocedure Evaluation (Signed)
 Anesthesia Post Note  Patient: Kathryn [name]  Procedure(s) Performed: ARTHROPLASTY, HIP, TOTAL, ANTERIOR APPROACH (Right: Hip)     Patient location during evaluation: PACU Anesthesia Type: General Level of consciousness: awake and alert Pain management: pain level controlled Vital Signs Assessment: post-procedure vital signs reviewed and stable Respiratory status: spontaneous breathing, nonlabored ventilation and respiratory function stable Cardiovascular status: blood pressure returned to baseline Postop Assessment: no apparent nausea or vomiting Anesthetic complications: no   No notable events documented.  Last Vitals:  Vitals:   06/21/24 1315 06/21/24 1330  BP: 97/67 107/60  Pulse: 65 67  Resp: 10 10  Temp:    SpO2: 100% 100%    Last Pain:  Vitals:   06/21/24 1330  TempSrc:   PainSc: Asleep                 [name]

## 2024-06-21 NOTE — Evaluation (Signed)
 Physical Therapy Evaluation Patient Details Name: Kathryn [name] MRN: [id number] DOB: 1956/02/20 Today's Date: 06/21/2024  History of Present Illness  68 yo female presents to therapy s/p R THA, anterior approach due to failure of conservative measures. Pt PMH includes but is not limited to: aortic atherosclerosis, esophageal achalasia, lumbar radiculopathy and spondylolisthesis, OA, hypothyroidism, HLD, HTN, dyspnea, carpal tunnel syndrome, neuropathy B LE, HA, GERD, cervical DDD, A-fib, anemia, anxiety, ba ca, lumbar and cervical surgeries, L THA, and B TKA.  Clinical Impression      [name] is a 68 y.o. female POD 0 s/p R THA. Patient reports mod I  with mobility and required A for lower body dressing and bathing at baseline. Patient is now limited by functional impairments (see PT problem list below) and requires mod A x 2 for bed mobility and min/mod A x 2 for transfers. Patient was able to ambulate 3 feet with RW and min A level of assist. Patient instructed in exercise to facilitate ROM and circulation to manage edema. Patient will benefit from continued skilled PT interventions to address impairments and progress towards PLOF. Acute PT will follow to progress mobility and stair training in preparation for safe discharge home with family support and HEP.     If plan is discharge home, recommend the following: A little help with walking and/or transfers;A little help with bathing/dressing/bathroom;Assistance with cooking/housework;Assist for transportation;Help with stairs or ramp for entrance   Can travel by private vehicle        Equipment Recommendations None recommended by PT  Recommendations for Other Services       Functional Status Assessment Patient has had a recent decline in their functional status and demonstrates the ability to make significant improvements in function in a reasonable and predictable amount of time.     Precautions / Restrictions  Precautions Precautions: Fall Restrictions Weight Bearing Restrictions Per Provider Order: No      Mobility  Bed Mobility Overal bed mobility: Needs Assistance Bed Mobility: Supine to Sit, Sit to Supine     Supine to sit: Mod assist, +2 for physical assistance, +2 for safety/equipment, HOB elevated, Used rails Sit to supine: Mod assist, +2 for physical assistance, +2 for safety/equipment   General bed mobility comments: min cues, increased time A for B LE and trunk with transitioning    Transfers Overall transfer level: Needs assistance Equipment used: Rolling walker (2 wheels) Transfers: Sit to/from Stand Sit to Stand: Min assist, Mod assist, +2 physical assistance, +2 safety/equipment, Via lift equipment           General transfer comment: min x 1 and mod A x 1 for sit to stand from EOB with cues and encouragement, once standing cues for eyes open and pt able to maintain static standing balance with 1 UE support    Ambulation/Gait Ambulation/Gait assistance: Min assist Gait Distance (Feet): 3 Feet Assistive device: Rolling walker (2 wheels) Gait Pattern/deviations: Step-to pattern, Antalgic, Trunk flexed, Decreased stance time - right Gait velocity: decreased     General Gait Details: PT guided pt though side stepping to the L toward [facility] for improved positioning once returning to supine, pt required CGA for safety and balance and min A for RW management cues for weight shifting and occational assist for R LE adduction with side stepping pattern  Stairs            Wheelchair Mobility     Tilt Bed    Modified Rankin (Stroke Patients Only)  Balance Overall balance assessment: Needs assistance Sitting-balance support: Feet supported Sitting balance-Leahy Scale: Fair     Standing balance support: Bilateral upper extremity supported, During functional activity, Reliant on assistive device for balance Standing balance-Leahy Scale: Zero                                Pertinent Vitals/Pain Pain Assessment Pain Assessment: Faces Faces Pain Scale: Hurts even more Pain Location: R hip and LE Pain Descriptors / Indicators: Aching, Constant, Discomfort, Dull, Grimacing, Operative site guarding Pain Intervention(s): Limited activity within patient's tolerance, Monitored during session, Premedicated before session, Repositioned, Ice applied    Home Living Family/patient expects to be discharged to:: Private residence Living Arrangements: Spouse/significant other Available Help at Discharge: Family Type of Home: House Home Access: Stairs to enter Entrance Stairs-Rails: Right;Left;Can reach both Entrance Stairs-Number of Steps: 3   Home Layout: Two level;Able to live on main level with bedroom/bathroom Home Equipment: Rolling Walker (2 wheels);Shower seat - built in;Grab bars - tub/shower;Adaptive equipment      Prior Function Prior Level of Function : Needs assist       Physical Assist : ADLs (physical)   ADLs (physical): Dressing;Bathing;IADLs Mobility Comments: mod I with intermittent use of SPC ADLs Comments: pt required some assist for lower body bathing and dressing     Extremity/Trunk Assessment        Lower Extremity Assessment Lower Extremity Assessment: RLE deficits/detail RLE Deficits / Details: ankle DF/PF 5/5 RLE Sensation: history of peripheral neuropathy    Cervical / Trunk Assessment Cervical / Trunk Assessment: Neck Surgery;Back Surgery  Communication   Communication Communication: No apparent difficulties    Cognition Arousal: Lethargic, Suspect due to medications Behavior During Therapy: Flat affect   PT - Cognitive impairments: No apparent impairments                       PT - Cognition Comments: pt required cues to open eyes, stated very sleepy and limited rest last night compounded by medications Following commands: Impaired Following commands impaired: Follows one  step commands with increased time     Cueing       General Comments      Exercises Total Joint Exercises Ankle Circles/Pumps: AROM, Both, 5 reps   Assessment/Plan    PT Assessment Patient needs continued PT services  PT Problem List Decreased strength;Decreased range of motion;Decreased activity tolerance;Decreased balance;Decreased mobility;Decreased coordination;Pain       PT Treatment Interventions DME instruction;Gait training;Stair training;Functional mobility training;Therapeutic activities;Therapeutic exercise;Balance training;Neuromuscular re-education;Patient/family education;Modalities    PT Goals (Current goals can be found in the Care Plan section)  Acute Rehab PT Goals Patient Stated Goal: get some sleep, get stronger and be able to go to the beach with daughter PT Goal Formulation: With patient Time For Goal Achievement: 07/06/24 Potential to Achieve Goals: Good    Frequency 7X/week     Co-evaluation               AM-PAC PT 6 Clicks Mobility  Outcome Measure Help needed turning from your back to your side while in a flat bed without using bedrails?: A Little Help needed moving from lying on your back to sitting on the side of a flat bed without using bedrails?: A Lot Help needed moving to and from a bed to a chair (including a wheelchair)?: A Lot Help needed standing up from a chair using your arms (  e.g., wheelchair or bedside chair)?: A Lot Help needed to walk in hospital room?: A Lot Help needed climbing 3-5 steps with a railing? : Total 6 Click Score: 12    End of Session Equipment Utilized During Treatment: Gait belt Activity Tolerance: Patient limited by lethargy Patient left: in bed;with call bell/phone within reach;with family/visitor present Nurse Communication: Mobility status PT Visit Diagnosis: Unsteadiness on feet (R26.81);Other abnormalities of gait and mobility (R26.89);Muscle weakness (generalized) (M62.81);Difficulty in walking,  not elsewhere classified (R26.2);Pain Pain - Right/Left: Right Pain - part of body: Leg;Hip    Time: 8461-8441 PT Time Calculation (min) (ACUTE ONLY): 20 min   Charges:   PT Evaluation $PT Eval Low Complexity: 1 Low   PT General Charges $$ ACUTE PT VISIT: 1 Visit         [place], PT Acute Rehab   [place] VEAR Drone 06/21/2024, 5:07 PM

## 2024-06-21 NOTE — Transfer of Care (Signed)
 Immediate Anesthesia Transfer of Care Note  Patient: Kathryn [name]  Procedure(s) Performed: ARTHROPLASTY, HIP, TOTAL, ANTERIOR APPROACH (Right: Hip)  Patient Location: PACU  Anesthesia Type:General  Level of Consciousness: awake, alert , oriented, and patient cooperative  Airway & Oxygen Therapy: Patient Spontanous Breathing and Patient connected to face mask oxygen  Post-op Assessment: Report given to RN and Post -op Vital signs reviewed and stable  Post vital signs: Reviewed and stable  Last Vitals:  Vitals Value Taken Time  BP 125/71 06/21/24 12:47  Temp 36.6 C 06/21/24 12:47  Pulse 60 06/21/24 12:51  Resp 14 06/21/24 12:51  SpO2 100 % 06/21/24 12:51  Vitals shown include unfiled device data.  Last Pain:  Vitals:   06/21/24 1247  TempSrc:   PainSc: 0-No pain         Complications: No notable events documented.

## 2024-06-22 ENCOUNTER — Telehealth (HOSPITAL_COMMUNITY): Payer: Self-pay | Admitting: Pharmacy Technician

## 2024-06-22 ENCOUNTER — Other Ambulatory Visit: Payer: Self-pay

## 2024-06-22 ENCOUNTER — Other Ambulatory Visit (HOSPITAL_COMMUNITY): Payer: Self-pay

## 2024-06-22 ENCOUNTER — Encounter (HOSPITAL_COMMUNITY): Payer: Self-pay | Admitting: Orthopedic Surgery

## 2024-06-22 DIAGNOSIS — K449 Diaphragmatic hernia without obstruction or gangrene: Secondary | ICD-10-CM | POA: Diagnosis not present

## 2024-06-22 DIAGNOSIS — F418 Other specified anxiety disorders: Secondary | ICD-10-CM | POA: Diagnosis not present

## 2024-06-22 DIAGNOSIS — I10 Essential (primary) hypertension: Secondary | ICD-10-CM | POA: Diagnosis not present

## 2024-06-22 DIAGNOSIS — Z8249 Family history of ischemic heart disease and other diseases of the circulatory system: Secondary | ICD-10-CM | POA: Diagnosis not present

## 2024-06-22 DIAGNOSIS — Z7901 Long term (current) use of anticoagulants: Secondary | ICD-10-CM | POA: Diagnosis not present

## 2024-06-22 DIAGNOSIS — I480 Paroxysmal atrial fibrillation: Secondary | ICD-10-CM | POA: Diagnosis not present

## 2024-06-22 DIAGNOSIS — E039 Hypothyroidism, unspecified: Secondary | ICD-10-CM | POA: Diagnosis not present

## 2024-06-22 DIAGNOSIS — M1611 Unilateral primary osteoarthritis, right hip: Secondary | ICD-10-CM | POA: Diagnosis not present

## 2024-06-22 DIAGNOSIS — M25751 Osteophyte, right hip: Secondary | ICD-10-CM | POA: Diagnosis not present

## 2024-06-22 DIAGNOSIS — Z79899 Other long term (current) drug therapy: Secondary | ICD-10-CM | POA: Diagnosis not present

## 2024-06-22 DIAGNOSIS — K219 Gastro-esophageal reflux disease without esophagitis: Secondary | ICD-10-CM | POA: Diagnosis not present

## 2024-06-22 LAB — BASIC METABOLIC PANEL WITH GFR
Anion gap: 12 (ref 5–15)
BUN: 19 mg/dL (ref 8–23)
CO2: 25 mmol/L (ref 22–32)
Calcium: 8.8 mg/dL — ABNORMAL LOW (ref 8.9–10.3)
Chloride: 98 mmol/L (ref 98–111)
Creatinine, Ser: 0.74 mg/dL (ref 0.44–1.00)
GFR, Estimated: >60 mL/min (ref >60)
Glucose, Bld: 125 mg/dL — ABNORMAL HIGH (ref 70–99)
Potassium: 4.2 mmol/L (ref 3.5–5.1)
Sodium: 135 mmol/L (ref 135–145)

## 2024-06-22 LAB — CBC
HCT: 34.4 % — ABNORMAL LOW (ref 36.0–46.0)
Hemoglobin: 10.6 g/dL — ABNORMAL LOW (ref 12.0–15.0)
MCH: 28.5 pg (ref 26.0–34.0)
MCHC: 30.8 g/dL (ref 30.0–36.0)
MCV: 92.5 fL (ref 80.0–100.0)
Platelets: 245 K/uL (ref 150–400)
RBC: 3.72 MIL/uL — ABNORMAL LOW (ref 3.87–5.11)
RDW: 18.4 % — ABNORMAL HIGH (ref 11.5–15.5)
WBC: 11.8 K/uL — ABNORMAL HIGH (ref 4.0–10.5)
nRBC: 0 % (ref 0.0–0.2)

## 2024-06-22 MED ORDER — ONDANSETRON HCL 4 MG PO TABS: 4.0000 mg | ORAL_TABLET | Freq: Three times a day (TID) | ORAL | 0 refills | Status: DC | PRN

## 2024-06-22 MED ORDER — OXYCODONE HCL 7.5 MG PO TABS: 7.5000 mg | ORAL_TABLET | ORAL | 0 refills | Status: DC | PRN

## 2024-06-22 MED ORDER — SENNA 8.6 MG PO TABS: 2.0000 | ORAL_TABLET | Freq: Every day | ORAL | 0 refills | Status: AC

## 2024-06-22 MED ORDER — POLYETHYLENE GLYCOL 3350 17 G PO PACK: 17.0000 g | PACK | Freq: Every day | ORAL | 0 refills | Status: AC | PRN

## 2024-06-22 MED ORDER — DOCUSATE SODIUM 100 MG PO CAPS: 100.0000 mg | ORAL_CAPSULE | Freq: Two times a day (BID) | ORAL | 0 refills | Status: AC

## 2024-06-22 NOTE — Progress Notes (Signed)
    Subjective:  Patient reports pain as mild to moderate.  Denies N/V/CP/SOB/Abd pain. She reports some soreness in her thigh.  Denies tingling or numbness in LE bilaterally.  She reports she had I&O overnight but is voiding without difficulty now.  Husband at bedside.   Objective:   VITALS:   Vitals:   06/21/24 1720 06/21/24 2103 06/22/24 0119 06/22/24 0509  BP: (!) 96/59 107/87 110/74 127/83  Pulse: 63 71 75 69  Resp: 16 18 18 18   Temp: 97.7 F (36.5 C) 97.7 F (36.5 C) (!) 97.5 F (36.4 C) 97.9 F (36.6 C)  TempSrc:  Oral Oral Oral  SpO2: 99% 91% 100% 99%  Weight:      Height:        NAD Neurologically intact ABD soft Neurovascular intact Sensation intact distally Intact pulses distally Dorsiflexion/Plantar flexion intact Incision: dressing C/D/I No cellulitis present Compartment soft   Lab Results  Component Value Date   WBC 11.8 (H) 06/22/2024   HGB 10.6 (L) 06/22/2024   HCT 34.4 (L) 06/22/2024   MCV 92.5 06/22/2024   PLT 245 06/22/2024   BMET    Component Value Date/Time   NA 135 06/22/2024 0327   NA 138 01/28/2023 1012   NA 142 02/09/2017 1502   K 4.2 06/22/2024 0327   K 3.9 02/09/2017 1502   CL 98 06/22/2024 0327   CO2 25 06/22/2024 0327   CO2 26 02/09/2017 1502   GLUCOSE 125 (H) 06/22/2024 0327   GLUCOSE 103 02/09/2017 1502   BUN 19 06/22/2024 0327   BUN 15 01/28/2023 1012   BUN 12.3 02/09/2017 1502   CREATININE 0.74 06/22/2024 0327   CREATININE 1.14 (H) 06/08/2022 1308   CREATININE 0.9 02/09/2017 1502   CALCIUM 8.8 (L) 06/22/2024 0327   CALCIUM 9.6 02/09/2017 1502   EGFR 74 01/28/2023 1012   GFRNONAA >60 06/22/2024 0327     Assessment/Plan: 1 Day Post-Op   Principal Problem:   Osteoarthritis of right hip Active Problems:   S/P total right hip arthroplasty  ABLA. Hemoglobin 10.6. Continue to monitor.   WBAT with walker DVT ppx: Eliquis , SCDs, TEDS PO pain control PT/OT: 3 feet with PT yesterday. Continue today.  Dispo:   - D/c home with HEP once cleared with PT.    Kathryn [name] Potters 06/22/2024, 7:38 AM   [facility]  [place] [address], [place], [place] [address] Phone: [phone] [email or web address] Facebook  [employer]

## 2024-06-22 NOTE — Plan of Care (Signed)
   Problem: Activity: Goal: Risk for activity intolerance will decrease Outcome: Progressing   Problem: Pain Managment: Goal: General experience of comfort will improve and/or be controlled Outcome: Progressing   Problem: Safety: Goal: Ability to remain free from injury will improve Outcome: Progressing

## 2024-06-22 NOTE — Telephone Encounter (Signed)
 Patient Product/process development scientist completed.    The patient is insured through [facility]. Patient has Medicare and is not eligible for a copay card, but may be able to apply for patient assistance or Medicare RX Payment Plan (Patient Must reach out to their plan, if eligible for payment plan), if available.    Ran test claim for Eliquis  5 mg and the current 30 day co-pay is $317.86 due to a deductible.  Will be $45.00 once deductible is met.   This test claim was processed through [facility]- copay amounts may vary at other pharmacies due to pharmacy/plan contracts, or as the patient moves through the different stages of their insurance plan.     Reyes Sharps, CPHT Pharmacy Technician III Certified Patient Advocate [facility] Patient Advocate Team Direct Number: [phone]  Fax: [phone]

## 2024-06-22 NOTE — Plan of Care (Signed)

## 2024-06-22 NOTE — Progress Notes (Signed)
 Physical Therapy Treatment Patient Details Name: Kathryn ESTUPINAN MRN: [id number] DOB: 03-Apr-1956 Today's Date: 06/22/2024   History of Present Illness 68 yo female presents to therapy s/p R THA, anterior approach due to failure of conservative measures. Pt PMH includes but is not limited to: aortic atherosclerosis, esophageal achalasia, lumbar radiculopathy and spondylolisthesis, OA, hypothyroidism, HLD, HTN, dyspnea, carpal tunnel syndrome, neuropathy B LE, HA, GERD, cervical DDD, A-fib, anemia, anxiety, ba ca, lumbar and cervical surgeries, L THA, and B TKA.    PT Comments  Pt is mobilizing well, she ambulated 100' with RW, no loss of balance. Stair training completed. Reviewed THA HEP, pt/spouse demonstrate good understanding. She is ready to DC home from a PT standpoint.      If plan is discharge home, recommend the following: A little help with bathing/dressing/bathroom;Assistance with cooking/housework;Assist for transportation;Help with stairs or ramp for entrance   Can travel by private vehicle        Equipment Recommendations  None recommended by PT    Recommendations for Other Services       Precautions / Restrictions Precautions Precautions: Fall Recall of Precautions/Restrictions: Intact Restrictions Weight Bearing Restrictions Per Provider Order: No RLE Weight Bearing Per Provider Order: Weight bearing as tolerated     Mobility  Bed Mobility Overal bed mobility: Modified Independent Bed Mobility: Supine to Sit     Supine to sit: Supervision, HOB elevated, Used rails     General bed mobility comments: gait belt used as RLE lifter    Transfers Overall transfer level: Needs assistance Equipment used: Rolling walker (2 wheels) Transfers: Sit to/from Stand Sit to Stand: Supervision           General transfer comment: VCs hand placement    Ambulation/Gait Ambulation/Gait assistance: Supervision, Modified independent (Device/Increase time) Gait Distance  (Feet): 100 Feet Assistive device: Rolling walker (2 wheels) Gait Pattern/deviations: Step-to pattern, Antalgic, Trunk flexed, Decreased stance time - right Gait velocity: decreased     General Gait Details: VCs posture, good sequencing, no LOB   Stairs Stairs: Yes Stairs assistance: Contact guard assist Stair Management: Two rails, Step to pattern, Forwards Number of Stairs: 3 General stair comments: VCs sequencing   Wheelchair Mobility     Tilt Bed    Modified Rankin (Stroke Patients Only)       Balance Overall balance assessment: Needs assistance Sitting-balance support: Feet supported Sitting balance-Leahy Scale: Good     Standing balance support: Bilateral upper extremity supported, During functional activity, Reliant on assistive device for balance Standing balance-Leahy Scale: Fair                              Hotel manager: No apparent difficulties  Cognition Arousal: Alert Behavior During Therapy: WFL for tasks assessed/performed   PT - Cognitive impairments: No apparent impairments                         Following commands: Intact Following commands impaired: Only follows one step commands consistently    Cueing    Exercises Total Joint Exercises Ankle Circles/Pumps: AROM, Both, 10 reps, Seated Quad Sets: AROM, Both, 5 reps, Supine Short Arc Quad: AROM, Right, 5 reps, Supine Heel Slides: AAROM, Right, 5 reps, Supine Hip ABduction/ADduction: AAROM, Right, 5 reps, Supine Long Arc Quad: AROM, Right, 5 reps, Seated    General Comments        Pertinent Vitals/Pain Pain Assessment Pain Assessment:  0-10 Pain Score: 5  Pain Location: R hip and thigh Pain Descriptors / Indicators: Aching, Constant, Discomfort, Dull, Grimacing, Operative site guarding Pain Intervention(s): Limited activity within patient's tolerance, Monitored during session, Premedicated before session, Ice applied, Repositioned     Home Living                          Prior Function            PT Goals (current goals can now be found in the care plan section) Acute Rehab PT Goals Patient Stated Goal: get some sleep, get stronger and be able to go to the beach with daughter PT Goal Formulation: With patient/family Time For Goal Achievement: 07/06/24 Potential to Achieve Goals: Good Progress towards PT goals: Goals met/education completed, patient discharged from PT    Frequency    7X/week      PT Plan      Co-evaluation              AM-PAC PT 6 Clicks Mobility   Outcome Measure  Help needed turning from your back to your side while in a flat bed without using bedrails?: None Help needed moving from lying on your back to sitting on the side of a flat bed without using bedrails?: A Little Help needed moving to and from a bed to a chair (including a wheelchair)?: None Help needed standing up from a chair using your arms (e.g., wheelchair or bedside chair)?: None Help needed to walk in hospital room?: None Help needed climbing 3-5 steps with a railing? : A Little 6 Click Score: 22    End of Session Equipment Utilized During Treatment: Gait belt Activity Tolerance: Patient tolerated treatment well Patient left: in chair;with call bell/phone within reach;with family/visitor present Nurse Communication: Mobility status PT Visit Diagnosis: Other abnormalities of gait and mobility (R26.89);Muscle weakness (generalized) (M62.81);Difficulty in walking, not elsewhere classified (R26.2);Pain Pain - Right/Left: Right Pain - part of body: Hip     Time: 0900-0940 PT Time Calculation (min) (ACUTE ONLY): 40 min  Charges:    $Gait Training: 8-22 mins $Therapeutic Exercise: 8-22 mins $Therapeutic Activity: 8-22 mins PT General Charges $$ ACUTE PT VISIT: 1 Visit                     Sylvan Delon Copp PT 06/22/2024  Acute Rehabilitation Services  Office  [phone]

## 2024-06-22 NOTE — TOC Transition Note (Signed)
 Transition of Care [facility]) - Discharge Note   Patient Details  Name: Kathryn [name] MRN: [id number] Date of Birth: 1956-11-21  Transition of Care [facility]) CM/SW Contact:  NORMAN ASPEN, LCSW Phone Number: 06/22/2024, 10:08 AM   Clinical Narrative:     Met with pt who confirms she has all needed DME in the home.  Plan for HEP.  No further TOC needs.  Final next level of care: Home/Self Care Barriers to Discharge: No Barriers Identified   Patient Goals and CMS Choice Patient states their goals for this hospitalization and ongoing recovery are:: return home          Discharge Placement                       Discharge Plan and Services Additional resources added to the After Visit Summary for                  DME Arranged: N/A DME Agency: NA                  Social Drivers of Health (SDOH) Interventions SDOH Screenings   Food Insecurity: No Food Insecurity (06/21/2024)  Housing: Low Risk  (06/21/2024)  Transportation Needs: No Transportation Needs (06/21/2024)  Utilities: Not At Risk (06/21/2024)  Financial Resource Strain: Medium Risk (05/07/2024)   Received from [facility]  Physical Activity: Unknown (05/07/2024)   Received from [facility]  Social Connections: Socially Integrated (06/21/2024)  Stress: Stress Concern Present (05/07/2024)   Received from [facility]  Tobacco Use: Low Risk  (06/21/2024)     Readmission Risk Interventions     No data to display

## 2024-06-23 NOTE — Op Note (Signed)
 OPERATIVE REPORT  SURGEON: Redell Shoals, MD   ASSISTANT: Valery Potters, PA-C.  PREOPERATIVE DIAGNOSIS: Right hip arthritis.   POSTOPERATIVE DIAGNOSIS: Right hip arthritis.   PROCEDURE: Right total hip arthroplasty, anterior approach.   IMPLANTS: Biomet Taperloc Complete Microplasty stem, size 11 x 107.5 mm, high offset. Biomet G7 OsseoTi Cup, size 52 mm. Biomet Vivacit-E liner, size 36 mm, E,  neutral. Biomet Biolox ceramic head ball, size 36 + 0 mm.  ANESTHESIA:  General  ESTIMATED BLOOD LOSS:-150 mL    ANTIBIOTICS: 2g Ancef .  DRAINS: None.  COMPLICATIONS: None.   CONDITION: PACU - hemodynamically stable.   BRIEF CLINICAL NOTE: Kathryn [name] is a 67 y.o. female with a long-standing history of Right hip arthritis. After failing conservative management, the patient was indicated for total hip arthroplasty. The risks, benefits, and alternatives to the procedure were explained, and the patient elected to proceed.  PROCEDURE IN DETAIL: Surgical site was marked by myself in the pre-op holding area. Once inside the operating room, spinal anesthesia was obtained, and a foley catheter was inserted. The patient was then positioned on the Hana table.  All bony prominences were well padded.  The hip was prepped and draped in the normal sterile surgical fashion.  A time-out was called verifying side and site of surgery. The patient received IV antibiotics within 60 minutes of beginning the procedure.   Bikini incision was made, and superficial dissection was performed lateral to the ASIS. The direct anterior approach to the hip was performed through the Hueter interval.  Lateral femoral circumflex vessels were treated with the Auqumantys. The anterior capsule was exposed and an inverted T capsulotomy was made. The femoral neck cut was made to the level of the templated cut.  A corkscrew was placed into the head and the head was removed.  The femoral head was found to have eburnated  bone. The head was passed to the back table and was measured. Pubofemoral ligament was released off of the calcar, taking care to stay on bone. Superior capsule was released from the greater trochanter, taking care to stay lateral to the posterior border of the femoral neck in order to preserve the short external rotators.   Acetabular exposure was achieved, and the pulvinar and labrum were excised. Sequential reaming of the acetabulum was then performed up to a size 51 mm reamer. A 52 mm cup was then opened and impacted into place at approximately 40 degrees of abduction and 20 degrees of anteversion. The final polyethylene liner was impacted into place and acetabular osteophytes were removed.    I then gained femoral exposure taking care to protect the abductors and greater trochanter.  This was performed using standard external rotation, extension, and adduction.  A cookie cutter was used to enter the femoral canal, and then the femoral canal finder was placed.  Sequential broaching was performed up to a size 11.  Calcar planer was used on the femoral neck remnant.  I placed a high offset neck and a trial head ball.  The hip was reduced.  Leg lengths and offset were checked fluoroscopically.  The hip was dislocated and trial components were removed.  The final implants were placed, and the hip was reduced.  Fluoroscopy was used to confirm component position and leg lengths.  At 90 degrees of external rotation and full extension, the hip was stable to an anterior directed force.   The wound was copiously irrigated with Prontosan solution and normal saline using pulse lavage.  Marcaine   solution was injected into the periarticular soft tissue.  The wound was closed in layers using #1 Vicryl and V-Loc for the fascia, 2-0 Vicryl for the subcutaneous fat, 2-0 Monocryl for the deep dermal layer, 3-0 running Monocryl subcuticular stitch, and Dermabond for the skin.  Once the glue was fully dried, an Aquacell Ag  dressing was applied.  The patient was transported to the recovery room in stable condition.  Sponge, needle, and instrument counts were correct at the end of the case x2.  The patient tolerated the procedure well and there were no known complications.  Please note that a surgical assistant was a medical necessity for this procedure to perform it in a safe and expeditious manner. Assistant was necessary to provide appropriate retraction of vital neurovascular structures, to prevent femoral fracture, and to allow for anatomic placement of the prosthesis.

## 2024-07-07 DIAGNOSIS — Z471 Aftercare following joint replacement surgery: Secondary | ICD-10-CM | POA: Diagnosis not present

## 2024-07-07 DIAGNOSIS — Z96641 Presence of right artificial hip joint: Secondary | ICD-10-CM | POA: Diagnosis not present

## 2024-07-18 DIAGNOSIS — L299 Pruritus, unspecified: Secondary | ICD-10-CM | POA: Diagnosis not present

## 2024-07-18 DIAGNOSIS — I10 Essential (primary) hypertension: Secondary | ICD-10-CM | POA: Diagnosis not present

## 2024-08-08 DIAGNOSIS — E039 Hypothyroidism, unspecified: Secondary | ICD-10-CM | POA: Diagnosis not present

## 2024-08-08 DIAGNOSIS — I480 Paroxysmal atrial fibrillation: Secondary | ICD-10-CM | POA: Diagnosis not present

## 2024-08-09 LAB — LAB REPORT - SCANNED: EGFR: 54

## 2024-09-04 DIAGNOSIS — D509 Iron deficiency anemia, unspecified: Secondary | ICD-10-CM | POA: Diagnosis not present

## 2024-09-18 DIAGNOSIS — H02831 Dermatochalasis of right upper eyelid: Secondary | ICD-10-CM | POA: Diagnosis not present

## 2024-09-19 DIAGNOSIS — G894 Chronic pain syndrome: Secondary | ICD-10-CM | POA: Diagnosis not present

## 2024-09-19 DIAGNOSIS — F1120 Opioid dependence, uncomplicated: Secondary | ICD-10-CM | POA: Diagnosis not present

## 2024-09-25 ENCOUNTER — Telehealth: Payer: Self-pay | Admitting: Pharmacy Technician

## 2024-09-25 ENCOUNTER — Other Ambulatory Visit (HOSPITAL_COMMUNITY): Payer: Self-pay

## 2024-09-25 ENCOUNTER — Telehealth: Payer: Self-pay | Admitting: Cardiology

## 2024-09-25 NOTE — Telephone Encounter (Signed)
  Pt c/o medication issue:  1. Name of Medication:   apixaban  (ELIQUIS ) 5 MG TABS tablet    2. How are you currently taking this medication (dosage and times per day)? Take 1 tablet (5 mg total) by mouth 2 (two) times daily.Patient taking differently: Take 5 mg by mouth every evening.   3. Are you having a reaction (difficulty breathing--STAT)? No   4. What is your medication issue? Patient is requesting if she can enroll for a grant to help her with the cost for her Eliquis . She is running out of medication and can't get a refill because it was too expensive.

## 2024-09-25 NOTE — Telephone Encounter (Addendum)
   She does not have cardiomyopathy so she does not qualify for a grant.   Per test claim: eliquis  copay is 252.87 Generic Xarelto  copay is 55.20-She did have some bleeding issues on xarelto  per notes but the patient said that is not something she experienced  Generic dabigatran copay is 81.30-never discussed. Cheaper on goodrx 55.16   She is asking to be changed to generic xarelto  or generic pradaxa. If she qualified for bms now that would only be until 11/22/24 and she would have to re-apply and meet the 3% deductible.    Sent message in other encounter

## 2024-09-26 MED ORDER — RIVAROXABAN 20 MG PO TABS: 20.0000 mg | ORAL_TABLET | Freq: Every day | ORAL | 3 refills | Status: DC

## 2024-10-12 ENCOUNTER — Other Ambulatory Visit: Payer: Self-pay

## 2024-10-12 DIAGNOSIS — R1319 Other dysphagia: Secondary | ICD-10-CM | POA: Diagnosis not present

## 2024-10-12 DIAGNOSIS — R10829 Rebound abdominal tenderness, unspecified site: Secondary | ICD-10-CM

## 2024-10-12 DIAGNOSIS — Z86018 Personal history of other benign neoplasm: Secondary | ICD-10-CM | POA: Diagnosis not present

## 2024-10-12 DIAGNOSIS — R634 Abnormal weight loss: Secondary | ICD-10-CM | POA: Diagnosis not present

## 2024-10-12 DIAGNOSIS — I4891 Unspecified atrial fibrillation: Secondary | ICD-10-CM | POA: Diagnosis not present

## 2024-10-12 DIAGNOSIS — R109 Unspecified abdominal pain: Secondary | ICD-10-CM | POA: Diagnosis not present

## 2024-10-12 DIAGNOSIS — K5900 Constipation, unspecified: Secondary | ICD-10-CM | POA: Diagnosis not present

## 2024-10-12 DIAGNOSIS — K259 Gastric ulcer, unspecified as acute or chronic, without hemorrhage or perforation: Secondary | ICD-10-CM | POA: Diagnosis not present

## 2024-10-12 DIAGNOSIS — D649 Anemia, unspecified: Secondary | ICD-10-CM | POA: Diagnosis not present

## 2024-10-16 ENCOUNTER — Ambulatory Visit
Admission: RE | Admit: 2024-10-16 | Discharge: 2024-10-16 | Disposition: A | Discharge status: Home / Self Care | Source: Ambulatory Visit

## 2024-10-16 DIAGNOSIS — R109 Unspecified abdominal pain: Secondary | ICD-10-CM

## 2024-10-16 DIAGNOSIS — R10829 Rebound abdominal tenderness, unspecified site: Secondary | ICD-10-CM

## 2024-10-16 DIAGNOSIS — I700 Atherosclerosis of aorta: Secondary | ICD-10-CM | POA: Diagnosis not present

## 2024-10-16 DIAGNOSIS — R110 Nausea: Secondary | ICD-10-CM | POA: Diagnosis not present

## 2024-10-16 MED ORDER — IOPAMIDOL (ISOVUE-300) INJECTION 61%
100.0000 mL | Freq: Once | INTRAVENOUS | Status: AC | PRN
  Administered 2024-10-16: 100 mL via INTRAVENOUS

## 2024-10-18 ENCOUNTER — Telehealth: Payer: Self-pay

## 2024-10-18 NOTE — Telephone Encounter (Signed)
   Pre-operative Risk Assessment    Patient Name: Kathryn [name]  DOB: [birth year] MRN: [id number]   Date of last office visit: 02/10/2024 Dr.Lambert Date of next office visit: 10/23/2024 Dr. Cindie   Request for Surgical Clearance    Procedure:  colonoscopy/endoscopy  Date of Surgery:  Clearance TBD                                Surgeon:  Dr. Saintclair Jasper Surgeon's Group or Practice Name:  [facility] Phone number:  [phone] Fax number:  [phone]   Type of Clearance Requested:   - Medical  - Pharmacy:  Hold Apixaban  (Eliquis ) and Rivaroxaban  (Xarelto ) per request   Type of Anesthesia:  propofol    Additional requests/questions:    [name]   10/18/2024, 3:25 PM

## 2024-10-18 NOTE — Telephone Encounter (Signed)
   Name: Kathryn [name]  DOB: 1956-03-25  MRN: [id number]  Primary Cardiologist: Candyce Reek, MD  Chart reviewed as part of pre-operative protocol coverage. The patient has an upcoming visit scheduled with Dr. Cindie on 10/23/24 at which time clearance can be addressed in case there are any issues that would impact surgical recommendations.  Colonoscopy/endoscopy is not scheduled as below. I added preop FYI to appointment note so that provider is aware to address at time of outpatient visit.  Per office protocol the cardiology provider should forward their finalized clearance decision and recommendations regarding antiplatelet therapy to the requesting party below.    This message will also be routed to pharmacy pool for input on holding Xarelto  as requested below so that this information is available to the clearing provider at time of patient's appointment.   I will route this message as FYI to requesting party and remove this message from the preop box as separate preop APP input not needed at this time.   Please call with any questions.  Xadrian Craighead D Jahzaria Vary, NP  10/18/2024, 3:38 PM

## 2024-10-22 NOTE — Progress Notes (Unsigned)
  Electrophysiology Office Follow up Visit Note:    Date:  10/23/2024   ID:  Kathryn [name], DOB [birth year], MRN [id number]  PCP:  Nichole Senior, MD  [facility] Cardiologist:  Candyce Reek, MD  [facility] Electrophysiologist:  OLE ONEIDA HOLTS, MD    Interval History:     Kathryn [name] is a 68 y.o. female who presents for a follow up visit.   I last saw the patient in March 2025.  She is on Eliquis .  In the past we discussed atrial fibrillation ablation and left atrial appendage occlusion given a history of anemia.  The patient was admitted to an outside hospital in September of this year while visiting her daughter.  That hospitalization was atrial fibrillation with rapid ventricular rates.  TEE guided cardioversion was pursued.  She is with her husband today in clinic.  She reports continued episodes of symptomatic atrial fibrillation.  No sustained episodes since September.      Past medical, surgical, social and family history were reviewed.  ROS:   Please see the history of present illness.    All other systems reviewed and are negative.  EKGs/Labs/Other Studies Reviewed:    The following studies were reviewed today:          Physical Exam:    VS:  BP 138/86   Pulse (!) 59   Ht 5' 4 (1.626 m)   Wt 195 lb 1.6 oz (88.5 kg)   LMP  (LMP Unknown)   SpO2 95%   BMI 33.49 kg/m     Wt Readings from Last 3 Encounters:  10/23/24 195 lb 1.6 oz (88.5 kg)  06/21/24 197 lb (89.4 kg)  06/14/24 197 lb (89.4 kg)     GEN: no distress CARD: RRR, No MRG RESP: No IWOB. CTAB.      ASSESSMENT:    1. PAF (paroxysmal atrial fibrillation) (HCC)    PLAN:     In order of problems listed above:  #Atrial fibrillation Recent recurrence requiring hospitalization.  On metoprolol  scheduled and diltiazem  as needed On Eliquis  for stroke prophylaxis I discussed treatment options for her atrial fibrillation including continuing with a conservative management  strategy, pursuing antiarrhythmic drug therapy or catheter ablation.  Will start flecainide  50 mg by mouth twice daily.  I discussed this with pharmacy who felt like this should be a safe option with her Cymbalta .  Will bring back for EKG in 7 to 10 days.  Her baseline PR and QRS durations are acceptable for initiating flecainide .  #Preop restratification The patient is at acceptable risk to undergo orthopedic surgery.  She is okay to hold her Eliquis  for 2 to 3 days prior to the surgery and restart when felt safe from a postop surgical perspective.  The risks of stopping anticoagulation in the perioperative period have been discussed with the patient during today's clinic appointment and she is interested in proceeding with this strategy.  I discussed my upcoming departure from [facility] during today's clinic appointment.  The patient will continue to follow-up with one of my EP partners moving forward.    Signed, Ole Holts, MD, [facility], [facility] 10/23/2024 11:32 AM    Electrophysiology [facility]

## 2024-10-22 NOTE — Telephone Encounter (Signed)
 Patient with diagnosis of atrial fibrillation on Xarelto  for anticoagulation.   (Unsure if on Xarelto  or Eliquis , appears to be determining best priced product)  Procedure:  colonoscopy/endoscopy   Date of Surgery:  Clearance TBD        CHA2DS2-VASc Score = 3   This indicates a 3.2% annual risk of stroke. The patient's score is based upon: CHF History: 0 HTN History: 1 Diabetes History: 0 Stroke History: 0 Vascular Disease History: 0 Age Score: 1 Gender Score: 1    CrCl 100 Platelet count 279  Patient has not had an Afib/aflutter ablation in the last 3 months, DCCV within the last 4 weeks or a watchman implanted in the last 45 days    Per office protocol, patient can hold Xarelto  (or Eliquis ) for 2 days prior to procedure.   Patient will not need bridging with Lovenox  (enoxaparin ) around procedure.  **This guidance is not considered finalized until pre-operative APP has relayed final recommendations.**

## 2024-10-23 ENCOUNTER — Ambulatory Visit: Attending: Cardiology | Admitting: Cardiology

## 2024-10-23 ENCOUNTER — Encounter: Payer: Self-pay | Admitting: Cardiology

## 2024-10-23 VITALS — BP 138/86 | HR 59 | Ht 64.0 in | Wt 195.1 lb

## 2024-10-23 DIAGNOSIS — I480 Paroxysmal atrial fibrillation: Secondary | ICD-10-CM

## 2024-10-23 MED ORDER — FLECAINIDE ACETATE 50 MG PO TABS: 50.0000 mg | ORAL_TABLET | Freq: Two times a day (BID) | ORAL | 3 refills | Status: DC

## 2024-10-23 NOTE — Patient Instructions (Signed)
 Medication Instructions:  Your physician has recommended you make the following change in your medication:  1) START taking flecainide 50 mg twice daily  *If you need a refill on your cardiac medications before your next appointment, please call your pharmacy*  Follow-Up: At [facility], you and your health needs are our priority.  As part of our continuing mission to provide you with exceptional heart care, our providers are all part of one team.  This team includes your primary Cardiologist (physician) and Advanced Practice Providers or APPs (Physician Assistants and Nurse Practitioners) who all work together to provide you with the care you need, when you need it.  EKG - in 7-10 days   Your next appointment:   3 months  Provider:   You will see one of the following Advanced Practice Providers on your designated Care Team:   Charlies Arthur, [place] Ozell Jodie Passey, PA-C Suzann Riddle, NP Daphne Barrack, NP Artist Pouch, PA-C

## 2024-11-02 ENCOUNTER — Ambulatory Visit

## 2024-11-03 ENCOUNTER — Ambulatory Visit: Attending: Cardiology

## 2024-11-03 VITALS — HR 70 | Ht 64.0 in

## 2024-11-03 DIAGNOSIS — I480 Paroxysmal atrial fibrillation: Secondary | ICD-10-CM | POA: Diagnosis not present

## 2024-11-03 NOTE — Progress Notes (Unsigned)
° °  Nurse Visit   Date of Encounter: 11/03/2024 ID: Kathryn [name], DOB March 09, 1956, MRN [id number]  PCP:  Nichole Senior, MD   [facility] Providers Cardiologist:  Candyce Reek, MD Electrophysiologist:  OLE ONEIDA HOLTS, MD      Visit Details   VS:  LMP  (LMP Unknown)  , BMI There is no height or weight on file to calculate BMI.  Wt Readings from Last 3 Encounters:  10/23/24 195 lb 1.6 oz (88.5 kg)  06/21/24 197 lb (89.4 kg)  06/14/24 197 lb (89.4 kg)     Reason for visit: EKG for flecainide  start Performed today: Vitals and EKG Changes (medications, testing, etc.) : None Length of Visit: 15 minutes   Reviewed with Dr. Kennyth. Advised to continue on current dose of flecainide  for now.  Medications Adjustments/Labs and Tests Ordered: Orders Placed This Encounter  Procedures   EKG 12-Lead   EKG 12-Lead   No orders of the defined types were placed in this encounter.    Signed, Shalini Mair Chauvigne, RN  11/03/2024 12:40 PM

## 2024-11-28 ENCOUNTER — Emergency Department (HOSPITAL_COMMUNITY)

## 2024-11-28 ENCOUNTER — Encounter (HOSPITAL_COMMUNITY): Payer: Self-pay

## 2024-11-28 ENCOUNTER — Other Ambulatory Visit: Payer: Self-pay

## 2024-11-28 ENCOUNTER — Inpatient Hospital Stay (HOSPITAL_COMMUNITY)
Admission: EM | Admit: 2024-11-28 | Discharge: 2024-12-01 | DRG: 308 | Disposition: A | Discharge status: Home / Self Care | Attending: Internal Medicine | Admitting: Internal Medicine

## 2024-11-28 DIAGNOSIS — K220 Achalasia of cardia: Secondary | ICD-10-CM | POA: Diagnosis present

## 2024-11-28 DIAGNOSIS — R000 Tachycardia, unspecified: Secondary | ICD-10-CM

## 2024-11-28 DIAGNOSIS — R197 Diarrhea, unspecified: Secondary | ICD-10-CM | POA: Diagnosis present

## 2024-11-28 DIAGNOSIS — G629 Polyneuropathy, unspecified: Secondary | ICD-10-CM | POA: Diagnosis present

## 2024-11-28 DIAGNOSIS — E7800 Pure hypercholesterolemia, unspecified: Secondary | ICD-10-CM | POA: Diagnosis present

## 2024-11-28 DIAGNOSIS — Z96643 Presence of artificial hip joint, bilateral: Secondary | ICD-10-CM | POA: Diagnosis present

## 2024-11-28 DIAGNOSIS — Z6833 Body mass index (BMI) 33.0-33.9, adult: Secondary | ICD-10-CM

## 2024-11-28 DIAGNOSIS — I10 Essential (primary) hypertension: Secondary | ICD-10-CM | POA: Diagnosis present

## 2024-11-28 DIAGNOSIS — Z823 Family history of stroke: Secondary | ICD-10-CM

## 2024-11-28 DIAGNOSIS — Z8249 Family history of ischemic heart disease and other diseases of the circulatory system: Secondary | ICD-10-CM

## 2024-11-28 DIAGNOSIS — Z981 Arthrodesis status: Secondary | ICD-10-CM

## 2024-11-28 DIAGNOSIS — B9789 Other viral agents as the cause of diseases classified elsewhere: Secondary | ICD-10-CM | POA: Diagnosis present

## 2024-11-28 DIAGNOSIS — Z91138 Patient's unintentional underdosing of medication regimen for other reason: Secondary | ICD-10-CM

## 2024-11-28 DIAGNOSIS — M5416 Radiculopathy, lumbar region: Secondary | ICD-10-CM | POA: Diagnosis present

## 2024-11-28 DIAGNOSIS — E039 Hypothyroidism, unspecified: Secondary | ICD-10-CM | POA: Diagnosis present

## 2024-11-28 DIAGNOSIS — I952 Hypotension due to drugs: Secondary | ICD-10-CM | POA: Diagnosis present

## 2024-11-28 DIAGNOSIS — T45516A Underdosing of anticoagulants, initial encounter: Secondary | ICD-10-CM | POA: Diagnosis present

## 2024-11-28 DIAGNOSIS — Z853 Personal history of malignant neoplasm of breast: Secondary | ICD-10-CM

## 2024-11-28 DIAGNOSIS — I350 Nonrheumatic aortic (valve) stenosis: Secondary | ICD-10-CM | POA: Diagnosis present

## 2024-11-28 DIAGNOSIS — Z1152 Encounter for screening for COVID-19: Secondary | ICD-10-CM

## 2024-11-28 DIAGNOSIS — Z96651 Presence of right artificial knee joint: Secondary | ICD-10-CM | POA: Diagnosis present

## 2024-11-28 DIAGNOSIS — Z79899 Other long term (current) drug therapy: Secondary | ICD-10-CM

## 2024-11-28 DIAGNOSIS — I2510 Atherosclerotic heart disease of native coronary artery without angina pectoris: Secondary | ICD-10-CM | POA: Diagnosis present

## 2024-11-28 DIAGNOSIS — J9601 Acute respiratory failure with hypoxia: Principal | ICD-10-CM | POA: Diagnosis present

## 2024-11-28 DIAGNOSIS — Z90710 Acquired absence of both cervix and uterus: Secondary | ICD-10-CM

## 2024-11-28 DIAGNOSIS — Z7901 Long term (current) use of anticoagulants: Secondary | ICD-10-CM

## 2024-11-28 DIAGNOSIS — I4891 Unspecified atrial fibrillation: Secondary | ICD-10-CM | POA: Diagnosis not present

## 2024-11-28 DIAGNOSIS — I110 Hypertensive heart disease with heart failure: Secondary | ICD-10-CM | POA: Diagnosis present

## 2024-11-28 DIAGNOSIS — Z79890 Hormone replacement therapy (postmenopausal): Secondary | ICD-10-CM

## 2024-11-28 DIAGNOSIS — T462X6A Underdosing of other antidysrhythmic drugs, initial encounter: Secondary | ICD-10-CM | POA: Diagnosis present

## 2024-11-28 DIAGNOSIS — I480 Paroxysmal atrial fibrillation: Principal | ICD-10-CM | POA: Diagnosis present

## 2024-11-28 DIAGNOSIS — E871 Hypo-osmolality and hyponatremia: Secondary | ICD-10-CM | POA: Diagnosis not present

## 2024-11-28 DIAGNOSIS — R011 Cardiac murmur, unspecified: Secondary | ICD-10-CM | POA: Diagnosis present

## 2024-11-28 DIAGNOSIS — E876 Hypokalemia: Secondary | ICD-10-CM | POA: Diagnosis present

## 2024-11-28 DIAGNOSIS — F32A Depression, unspecified: Secondary | ICD-10-CM | POA: Diagnosis present

## 2024-11-28 DIAGNOSIS — T447X6A Underdosing of beta-adrenoreceptor antagonists, initial encounter: Secondary | ICD-10-CM | POA: Diagnosis present

## 2024-11-28 DIAGNOSIS — E66811 Obesity, class 1: Secondary | ICD-10-CM | POA: Diagnosis present

## 2024-11-28 DIAGNOSIS — I080 Rheumatic disorders of both mitral and aortic valves: Secondary | ICD-10-CM | POA: Diagnosis present

## 2024-11-28 DIAGNOSIS — K219 Gastro-esophageal reflux disease without esophagitis: Secondary | ICD-10-CM | POA: Diagnosis present

## 2024-11-28 DIAGNOSIS — T461X5A Adverse effect of calcium-channel blockers, initial encounter: Secondary | ICD-10-CM | POA: Diagnosis present

## 2024-11-28 DIAGNOSIS — I5033 Acute on chronic diastolic (congestive) heart failure: Secondary | ICD-10-CM | POA: Diagnosis present

## 2024-11-28 DIAGNOSIS — J988 Other specified respiratory disorders: Secondary | ICD-10-CM | POA: Diagnosis present

## 2024-11-28 LAB — I-STAT VENOUS BLOOD GAS, ED
Acid-Base Excess: 5 mmol/L — ABNORMAL HIGH (ref 0.0–2.0)
Bicarbonate: 28.1 mmol/L — ABNORMAL HIGH (ref 20.0–28.0)
Calcium, Ion: 1.01 mmol/L — ABNORMAL LOW (ref 1.15–1.40)
HCT: 47 % — ABNORMAL HIGH (ref 36.0–46.0)
Hemoglobin: 16 g/dL — ABNORMAL HIGH (ref 12.0–15.0)
O2 Saturation: 92 %
Potassium: 2.5 mmol/L — CL (ref 3.5–5.1)
Sodium: 134 mmol/L — ABNORMAL LOW (ref 135–145)
TCO2: 29 mmol/L (ref 22–32)
pCO2, Ven: 35.8 mmHg — ABNORMAL LOW (ref 44–60)
pH, Ven: 7.503 — ABNORMAL HIGH (ref 7.25–7.43)
pO2, Ven: 59 mmHg — ABNORMAL HIGH (ref 32–45)

## 2024-11-28 LAB — I-STAT CHEM 8, ED
BUN: 21 mg/dL (ref 8–23)
Calcium, Ion: 1.01 mmol/L — ABNORMAL LOW (ref 1.15–1.40)
Chloride: 94 mmol/L — ABNORMAL LOW (ref 98–111)
Creatinine, Ser: 1 mg/dL (ref 0.44–1.00)
Glucose, Bld: 162 mg/dL — ABNORMAL HIGH (ref 70–99)
HCT: 48 % — ABNORMAL HIGH (ref 36.0–46.0)
Hemoglobin: 16.3 g/dL — ABNORMAL HIGH (ref 12.0–15.0)
Potassium: 2.4 mmol/L — CL (ref 3.5–5.1)
Sodium: 134 mmol/L — ABNORMAL LOW (ref 135–145)
TCO2: 26 mmol/L (ref 22–32)

## 2024-11-28 LAB — CBC
HCT: 48 % — ABNORMAL HIGH (ref 36.0–46.0)
Hemoglobin: 16.7 g/dL — ABNORMAL HIGH (ref 12.0–15.0)
MCH: 30.6 pg (ref 26.0–34.0)
MCHC: 34.8 g/dL (ref 30.0–36.0)
MCV: 88.1 fL (ref 80.0–100.0)
Platelets: 250 K/uL (ref 150–400)
RBC: 5.45 MIL/uL — ABNORMAL HIGH (ref 3.87–5.11)
RDW: 14.6 % (ref 11.5–15.5)
WBC: 8.3 K/uL (ref 4.0–10.5)
nRBC: 0 % (ref 0.0–0.2)

## 2024-11-28 LAB — RESP PANEL BY RT-PCR (RSV, FLU A&B, COVID)  RVPGX2
Influenza A by PCR: NEGATIVE
Influenza B by PCR: NEGATIVE
Resp Syncytial Virus by PCR: NEGATIVE
SARS Coronavirus 2 by RT PCR: NEGATIVE

## 2024-11-28 LAB — COMPREHENSIVE METABOLIC PANEL WITH GFR
ALT: 12 U/L (ref 0–44)
AST: 25 U/L (ref 15–41)
Albumin: 3.8 g/dL (ref 3.5–5.0)
Alkaline Phosphatase: 99 U/L (ref 38–126)
Anion gap: 20 — ABNORMAL HIGH (ref 5–15)
BUN: 20 mg/dL (ref 8–23)
CO2: 24 mmol/L (ref 22–32)
Calcium: 9.1 mg/dL (ref 8.9–10.3)
Chloride: 91 mmol/L — ABNORMAL LOW (ref 98–111)
Creatinine, Ser: 0.91 mg/dL (ref 0.44–1.00)
GFR, Estimated: >60 mL/min
Glucose, Bld: 158 mg/dL — ABNORMAL HIGH (ref 70–99)
Potassium: 2.4 mmol/L — CL (ref 3.5–5.1)
Sodium: 135 mmol/L (ref 135–145)
Total Bilirubin: 0.8 mg/dL (ref 0.0–1.2)
Total Protein: 7 g/dL (ref 6.5–8.1)

## 2024-11-28 LAB — TROPONIN T, HIGH SENSITIVITY
Troponin T High Sensitivity: 16 ng/L (ref 0–19)
Troponin T High Sensitivity: 19 ng/L (ref 0–19)

## 2024-11-28 LAB — PRO BRAIN NATRIURETIC PEPTIDE: Pro Brain Natriuretic Peptide: 2408 pg/mL — ABNORMAL HIGH

## 2024-11-28 MED ORDER — POTASSIUM CHLORIDE 10 MEQ/100ML IV SOLN
10.0000 meq | INTRAVENOUS | Status: AC
  Administered 2024-11-28 (×2): 10 meq via INTRAVENOUS
  Filled 2024-11-28 (×2): qty 100

## 2024-11-28 MED ORDER — IPRATROPIUM-ALBUTEROL 0.5-2.5 (3) MG/3ML IN SOLN
RESPIRATORY_TRACT | Status: AC
  Administered 2024-11-28: 3 mL
  Filled 2024-11-28: qty 3

## 2024-11-28 MED ORDER — IOHEXOL 350 MG/ML SOLN
75.0000 mL | Freq: Once | INTRAVENOUS | Status: AC | PRN
  Administered 2024-11-28: 75 mL via INTRAVENOUS

## 2024-11-28 MED ORDER — IPRATROPIUM-ALBUTEROL 0.5-2.5 (3) MG/3ML IN SOLN
3.0000 mL | Freq: Once | RESPIRATORY_TRACT | Status: AC
  Administered 2024-11-28: 3 mL via RESPIRATORY_TRACT
  Filled 2024-11-28: qty 3

## 2024-11-28 MED ORDER — METHYLPREDNISOLONE SODIUM SUCC 125 MG IJ SOLR
125.0000 mg | Freq: Once | INTRAMUSCULAR | Status: AC
  Administered 2024-11-28: 125 mg via INTRAVENOUS
  Filled 2024-11-28: qty 2

## 2024-11-28 MED ORDER — LORAZEPAM 2 MG/ML IJ SOLN
1.0000 mg | Freq: Once | INTRAMUSCULAR | Status: AC
  Administered 2024-11-28: 1 mg via INTRAVENOUS
  Filled 2024-11-28: qty 1

## 2024-11-28 MED ORDER — AMIODARONE LOAD VIA INFUSION
150.0000 mg | Freq: Once | INTRAVENOUS | Status: AC
  Administered 2024-11-28: 150 mg via INTRAVENOUS
  Filled 2024-11-28: qty 83.34

## 2024-11-28 MED ORDER — SODIUM CHLORIDE 0.9 % IV SOLN
500.0000 mg | Freq: Once | INTRAVENOUS | Status: AC
  Administered 2024-11-28: 500 mg via INTRAVENOUS
  Filled 2024-11-28: qty 5

## 2024-11-28 MED ORDER — LORAZEPAM 2 MG/ML IJ SOLN
INTRAMUSCULAR | Status: AC
  Filled 2024-11-28: qty 1

## 2024-11-28 MED ORDER — AMIODARONE HCL IN DEXTROSE 360-4.14 MG/200ML-% IV SOLN
60.0000 mg/h | INTRAVENOUS | Status: DC
  Administered 2024-11-28: 60 mg/h via INTRAVENOUS
  Filled 2024-11-28 (×2): qty 200

## 2024-11-28 MED ORDER — APIXABAN 5 MG PO TABS
5.0000 mg | ORAL_TABLET | Freq: Two times a day (BID) | ORAL | Status: DC
  Administered 2024-11-28 – 2024-12-01 (×6): 5 mg via ORAL
  Filled 2024-11-28 (×6): qty 1

## 2024-11-28 MED ORDER — SODIUM CHLORIDE 0.9 % IV SOLN
1.0000 g | Freq: Once | INTRAVENOUS | Status: AC
  Administered 2024-11-28: 1 g via INTRAVENOUS
  Filled 2024-11-28: qty 10

## 2024-11-28 MED ORDER — DILTIAZEM HCL-DEXTROSE 125-5 MG/125ML-% IV SOLN (PREMIX)
5.0000 mg/h | INTRAVENOUS | Status: DC
  Administered 2024-11-28: 5 mg/h via INTRAVENOUS
  Filled 2024-11-28: qty 125

## 2024-11-28 MED ORDER — AMIODARONE HCL IN DEXTROSE 360-4.14 MG/200ML-% IV SOLN
30.0000 mg/h | INTRAVENOUS | Status: DC
  Administered 2024-11-29 – 2024-12-01 (×5): 30 mg/h via INTRAVENOUS
  Filled 2024-11-28 (×5): qty 200

## 2024-11-28 MED ORDER — ALBUTEROL SULFATE (2.5 MG/3ML) 0.083% IN NEBU
5.0000 mg | INHALATION_SOLUTION | Freq: Once | RESPIRATORY_TRACT | Status: AC
  Administered 2024-11-28: 5 mg via RESPIRATORY_TRACT
  Filled 2024-11-28: qty 6

## 2024-11-28 MED ORDER — MAGNESIUM SULFATE 2 GM/50ML IV SOLN
2.0000 g | INTRAVENOUS | Status: AC
  Administered 2024-11-28: 2 g via INTRAVENOUS
  Filled 2024-11-28: qty 50

## 2024-11-28 MED ORDER — ALBUTEROL SULFATE (2.5 MG/3ML) 0.083% IN NEBU
INHALATION_SOLUTION | RESPIRATORY_TRACT | Status: AC
  Administered 2024-11-28: 2.5 mg
  Filled 2024-11-28: qty 3

## 2024-11-28 MED ORDER — AMIODARONE IV BOLUS ONLY 150 MG/100ML
INTRAVENOUS | Status: AC
  Administered 2024-11-28: 150 mg
  Filled 2024-11-28: qty 100

## 2024-11-28 NOTE — ED Notes (Signed)
 Cardiology at bedside.

## 2024-11-28 NOTE — Consult Note (Incomplete)
 "  Cardiology Consultation   Patient ID: Kathryn [name] MRN: [id number]; DOB: Oct 25, 1956  Admit date: 11/28/2024 Date of Consult: 11/28/2024  PCP:  Kathryn Senior, MD   [facility] Providers Cardiologist:  Kathryn Reek, MD  Electrophysiologist:  Kathryn ONEIDA HOLTS, MD  { Click here to update MD or APP on Care Team, Refresh:1}     Patient Profile: Kathryn [name] is a 69 y.o. female with a hx of atrial fibrillation, anemia who is being seen 11/28/2024 for the evaluation of atrial fibrillation at the request of the Emergency Department.  History of Present Illness: Kathryn [name] was exerpiencing significant shortness of breath during my encounter and could not answer questions. The patient's husband was present and anserwed questions.  The husband, patient was in her usual state of health, when she developed upper respiratory symptoms a couple days ago.  She also had a loss of appetite and was not eating for the past 2 days .  She was also unable to take her medicines for the past 2 days.  On the day of ER visit she developed shortness of breath in the morning that lingered throughout the day.  This prompted an ER visit.  Denied chest pain, syncope.   Patient follows up with lecture physiology for management of atrial fibrillation.  She is on Eliquis  for stroke reduction.  She was recently started on flecainide  50 mg twice daily for management of her atrial fibrillation.  She is on metoprolol  succinate 100 mg twice daily, and diltiazem  30 mg every 4 hours as needed.  TTE in 2024 showed an EF of 6065%, normal RV function, normal LA size, no evidence of TR.     Notable labs include K 2.4, serum creatinine 1.0, high-sensitivity troponin 16 -> 19, proBNP 2408, hemoglobin 16.3,  Past Medical History:  Diagnosis Date   Anemia    Anxiety    Arthritis    Breast cancer (HCC)    Right breast, had lumpectomy only no chemo, radiation   Cancer (HCC) 10/2016   Breast right,   lumpectomy,  did not require chemo/xrt   DDD (degenerative disc disease), cervical    Depression    Dyspnea    Dysrhythmia    A Fib   Elevated cholesterol    GERD (gastroesophageal reflux disease)    H/O hiatal hernia    Headache    Hypertension    Hypothyroidism    Neuromuscular disorder (HCC)    neuropathy BLE and Feet numbness   Paroxysmal atrial fibrillation (HCC) 11/20/2020   PONV (postoperative nausea and vomiting)    Tremor    in hands and down left leg   Wears partial dentures    upper    Past Surgical History:  Procedure Laterality Date   ABDOMINAL HYSTERECTOMY  yrs ago   ANTERIOR LAT LUMBAR FUSION N/A 11/21/2019   Procedure: Lumbar two-three Lumbar three-four Anterolateral lumbar interbody fusion with lateral plate;  Surgeon: Colon Shove, MD;  Location: [facility];  Service: Neurosurgery;  Laterality: N/A;   BIOPSY  11/23/2023   Procedure: BIOPSY;  Surgeon: Saintclair Jasper, MD;  Location: [facility];  Service: Gastroenterology;;   BOTOX  INJECTION N/A 11/23/2023   Procedure: BOTOX  INJECTION;  Surgeon: Saintclair Jasper, MD;  Location: [facility];  Service: Gastroenterology;  Laterality: N/A;   BREAST EXCISIONAL BIOPSY Right 11/2016   ALH and papillomas   BREAST LUMPECTOMY Right 2017   BREAST LUMPECTOMY WITH RADIOACTIVE SEED LOCALIZATION Right 12/15/2016   Procedure: RADIOACTIVE SEED X 2  GUIDED EXCISIONAL BREAST BIOPSY;  Surgeon: Donnice Bury, MD;  Location: [facility];  Service: General;  Laterality: Right;   CARPAL TUNNEL RELEASE Bilateral yrs ago   x 2   cervical neck fusion  yrs ago   C 3 and C 4    COLONOSCOPY     ESOPHAGOGASTRODUODENOSCOPY (EGD) WITH PROPOFOL  N/A 11/23/2023   Procedure: ESOPHAGOGASTRODUODENOSCOPY (EGD) WITH PROPOFOL ;  Surgeon: Saintclair Jasper, MD;  Location: [facility];  Service: Gastroenterology;  Laterality: N/A;   ESOPHAGOGASTRODUODENOSCOPY ENDOSCOPY N/A 01/09/2022   Procedure: INTRAOPERATIVE ENDOSCOPY;   Surgeon: Rubin Calamity, MD;  Location: [facility] OR;  Service: General;  Laterality: N/A;   EYE SURGERY Bilateral    Cataracts removed   left knee arthroscopy   last done 2008   x 3   left shoulder arthroscopy  yrs ago   lower back surgery  2005   right total knee replacement   2008   TOTAL HIP ARTHROPLASTY Left 04/08/2023   Procedure: TOTAL HIP ARTHROPLASTY ANTERIOR APPROACH;  Surgeon: Fidel Rogue, MD;  Location: [facility];  Service: Orthopedics;  Laterality: Left;  150   TOTAL HIP ARTHROPLASTY Right 06/21/2024   Procedure: ARTHROPLASTY, HIP, TOTAL, ANTERIOR APPROACH;  Surgeon: Fidel Rogue, MD;  Location: [facility];  Service: Orthopedics;  Laterality: Right;   TOTAL KNEE ARTHROPLASTY  01/14/2012   Procedure: TOTAL KNEE ARTHROPLASTY;  Surgeon: Reyes JAYSON Billing, MD;  Location: [facility];  Service: Orthopedics;  Laterality: Left;  preop femoral nerve block   UPPER GI ENDOSCOPY     WISDOM TOOTH EXTRACTION       Home Medications:  Prior to Admission medications  Medication Sig Start Date End Date Taking? Authorizing Provider  acetaminophen  (TYLENOL ) 500 MG tablet Take 500-1,000 mg by mouth every 6 (six) hours as needed (pain.).    [provider]  buPROPion (WELLBUTRIN XL) 150 MG 24 hr tablet Take 150 mg by mouth every morning. 09/20/24   [provider]  Camphor-Menthol -Methyl Sal (SALONPAS) 3.11-28-08 % PTCH Place 1 patch onto the skin daily as needed (left hip pain).    [provider]  chlorhexidine  (HIBICLENS ) 4 % external liquid Apply 15 mLs (1 Application total) topically as directed for 30 doses. Use as directed daily for 5 days every other week for 6 weeks. 06/21/24   Leigh Valery RAMAN, PA-C  Cholecalciferol (D3 PO) Take 1 tablet by mouth daily.    [provider]  colchicine  0.6 MG tablet Take 0.6 mg by mouth as needed (gout flare). 09/04/21   [provider]  Cyanocobalamin  (VITAMIN B-12 PO) Take 1 tablet by mouth in the morning.    [provider]  diltiazem  (CARDIZEM  CD) 120 MG 24 hr capsule Take 120 mg by mouth. 08/18/24   [provider]  diltiazem  (CARDIZEM ) 30 MG tablet Take 1 tablet every 4 hours AS NEEDED for heart rate >100 as long as blood pressure >100. 10/06/23   Terra Fairy PARAS, PA-C  DULoxetine  (CYMBALTA ) 30 MG capsule Take 30 mg by mouth every evening. 30 mg + 60 mg=90 mg 09/08/22   [provider]  DULoxetine  (CYMBALTA ) 60 MG capsule Take 60 mg by mouth every evening. 60 mg + 30 mg=90 mg    [provider]  ELIQUIS  5 MG TABS tablet Take 5 mg by mouth 2 (two) times daily. 09/27/24   [provider]  flecainide  (TAMBOCOR ) 50 MG tablet Take 1 tablet (50 mg total) by mouth 2 (two) times daily. 10/23/24   Cindie,  Kathryn DASEN, MD  furosemide  (LASIX ) 20 MG tablet TAKE 1 TABLET BY MOUTH DAILY AS NEEDED 02/17/23   Cindie Kathryn DASEN, MD  gabapentin  (NEURONTIN ) 600 MG tablet Take 600 mg by mouth 3 (three) times daily. 08/03/22   [provider]  hydrochlorothiazide  (HYDRODIURIL ) 25 MG tablet Take 1 tablet by mouth once daily 06/01/24   Cindie Kathryn DASEN, MD  levothyroxine  (SYNTHROID ) 200 MCG tablet Take 200 mcg by mouth daily before breakfast.    [provider]  methocarbamol  (ROBAXIN ) 500 MG tablet Take 500 mg by mouth 4 (four) times daily.    [provider]  metoprolol  succinate (TOPROL -XL) 100 MG 24 hr tablet TAKE 1 TABLET BY MOUTH DAILY WITH OR IMMEDIATLY FOLLOWING A MEAL 03/03/24   Cindie Kathryn DASEN, MD  MOVANTIK 25 MG TABS tablet Take 25 mg by mouth every morning. 10/12/24   [provider]  omeprazole  (PRILOSEC ) 40 MG capsule Take 40 mg by mouth 2 (two) times daily. 10/13/24   [provider]  oxyCODONE  HCl 7.5 MG TABS Take 7.5 mg by mouth every 4 (four) hours as needed (severe postoperative pain). 06/22/24   Hill, Valery RAMAN, PA-C  oxyCODONE -acetaminophen  (PERCOCET) 7.5-325 MG tablet Take 1 tablet by mouth every 8 (eight) hours as needed.     [provider]  pantoprazole  (PROTONIX ) 40 MG tablet Take 40 mg by mouth 2 (two) times daily. Patient not taking: Reported on 10/23/2024 02/09/24   [provider]  rivaroxaban  (XARELTO ) 20 MG TABS tablet Take 1 tablet (20 mg total) by mouth daily with supper. Patient not taking: Reported on 10/23/2024 09/26/24   Cindie Kathryn T, MD    Scheduled Meds:  Continuous Infusions:  azithromycin  500 mg (11/28/24 1941)   diltiazem  (CARDIZEM ) infusion 15 mg/hr (11/28/24 1930)   potassium chloride  10 mEq (11/28/24 1943)   PRN Meds:   Allergies:   Allergies[1]  Social History:   Social History   Socioeconomic History   Marital status: Married    Spouse name: Not on file   Number of children: Not on file   Years of education: Not on file   Highest education level: Not on file  Occupational History   Not on file  Tobacco Use   Smoking status: Never   Smokeless tobacco: Never  Vaping Use   Vaping status: Never Used  Substance and Sexual Activity   Alcohol  use: Not Currently    Comment: occasional wine   Drug use: No   Sexual activity: Not Currently    Birth control/protection: Post-menopausal    Comment: Hysterectormy  Other Topics Concern   Not on file  Social History Narrative   Lives in [place] with spouse.   Retired marine scientist   Social Drivers of Health   Tobacco Use: Low Risk (10/23/2024)   Patient History    Smoking Tobacco Use: Never    Smokeless Tobacco Use: Never    Passive Exposure: Not on file  Financial Resource Strain: Low Risk (08/18/2024)   Received [facility]   Overall Financial Resource Strain (CARDIA)    How hard is it for you to pay for the very basics like food, housing, medical care, and heating?: Not very hard  Food Insecurity: No Food Insecurity (08/18/2024)   Received from [facility]   Epic    Within the past 12 months, you worried that your food would run out before you got the money  to buy more.: Never true    Within the  past 12 months, the food you bought just didn't last and you didn't have money to get more.: Never true  Transportation Needs: No Transportation Needs (08/18/2024)   Received from [facility]   Epic    In the past 12 months, has lack of transportation kept you from medical appointments or from getting medications?: No    In the past 12 months, has lack of transportation kept you from meetings, work, or from getting things needed for daily living?: No  Physical Activity: Insufficiently Active (08/18/2024)   Received [facility]   Exercise Vital Sign    On average, how many days per week do you engage in moderate to strenuous exercise (like a brisk walk)?: 3 days    On average, how many minutes do you engage in exercise at this level?: 30 min  Stress: No Stress Concern Present (08/18/2024)   Received from [facility] - Occupational Stress Questionnaire    Do you feel stress - tense, restless, nervous, or anxious, or unable to sleep at night because your mind is troubled all the time - these days?: Only a little  Social Connections: Socially Integrated (08/18/2024)   Received from [facility]   Social Connection and Isolation Panel    In a typical week, how many times do you talk on the phone with family, friends, or neighbors?: Three times a week    How often do you get together with friends or relatives?: Twice a week    How often do you attend church or religious services?: 1 to 4 times per year    Do you belong to any clubs or organizations such as church groups, unions, fraternal or athletic groups, or school groups?: Yes    How often do you attend meetings of the clubs or organizations you belong to?: 1 to 4 times per year    Are you married, widowed, divorced, separated, never married, or living with a partner?: Married  Intimate Partner Violence: Not At Risk (08/18/2024)   Received  from [facility]   Epic    Within the last year, have you been afraid of your partner or ex-partner?: No    Within the last year, have you been humiliated or emotionally abused in other ways by your partner or ex-partner?: No    Within the last year, have you been kicked, hit, slapped, or otherwise physically hurt by your partner or ex-partner?: No    Within the last year, have you been raped or forced to have any kind of sexual activity by your partner or ex-partner?: No  Depression (PHQ2-9): Not on file  Alcohol  Screen: Not on file  Housing: Low Risk (08/18/2024)   Received from [facility]   Epic    At any time in the past 12 months, were you homeless or living in a shelter (including now)?: No    In the past 12 months, how many times have you moved where you were living?: 0    In the last 12 months, was there a time when you were not able to pay the mortgage or rent on time?: No  Utilities: Not At Risk (08/18/2024)   Received from [facility]    In the past 12 months has the electric, gas, oil, or water  company threatened to shut off services in your home?: No  Health Literacy: Adequate Health Literacy (08/18/2024)   Received from [facility]   B1300 Health Literacy  How often do you need to have someone help you when you read instructions, pamphlets, or other written material from your doctor or pharmacy?: Never    Family History:    Family History  Problem Relation Age of Onset   Heart disease Father    Stroke Brother    Breast cancer Neg Hx      ROS:  Please see the history of present illness.   All other ROS reviewed and negative.     Physical Exam/Data: Vitals:   11/28/24 1940 11/28/24 1945 11/28/24 1950 11/28/24 1952  BP: (!) 104/94 91/61 111/79   Pulse: 80 (!) 160 (!) 133   Resp: (!) 21 15 15    Temp:    97.6 F (36.4 C)  TempSrc:    Oral  SpO2: 100% 100% 97%     Intake/Output Summary (Last 24 hours) at 11/28/2024 2015 Last data  filed at 11/28/2024 1930 Gross per 24 hour  Intake 157.32 ml  Output --  Net 157.32 ml      10/23/2024   11:24 AM 06/21/2024    8:12 AM 06/14/2024   11:23 AM  Last 3 Weights  Weight (lbs) 195 lb 1.6 oz 197 lb 197 lb  Weight (kg) 88.497 kg 89.359 kg 89.359 kg     There is no height or weight on file to calculate BMI.  General:  Well nourished, well developed, in no acute distress*** HEENT: normal Neck: no JVD Vascular: No carotid bruits; Distal pulses 2+ bilaterally Cardiac:  normal S1, S2; RRR; no murmur *** Lungs:  clear to auscultation bilaterally, no wheezing, rhonchi or rales  Abd: soft, nontender, no hepatomegaly  Ext: no edema Musculoskeletal:  No deformities, BUE and BLE strength normal and equal Skin: warm and dry  Neuro:  CNs 2-12 intact, no focal abnormalities noted Psych:  Normal affect   EKG:  The EKG was personally reviewed and demonstrates: Atrial fibrillation with rapid ventricular rates Telemetry:  Telemetry was personally reviewed and demonstrates:  ***  Relevant CV Studies: ***  Laboratory Data: High Sensitivity Troponin:  No results for input(s): TROPONINIHS in the last 720 hours.  Recent Labs  Lab 11/28/24 1811  TRNPT 16      Chemistry Recent Labs  Lab 11/28/24 1753 11/28/24 1809 11/28/24 1812  NA 134* 135 134*  K 2.5* 2.4* 2.4*  CL  --  91* 94*  CO2  --  24  --   GLUCOSE  --  158* 162*  BUN  --  20 21  CREATININE  --  0.91 1.00  CALCIUM  --  9.1  --   GFRNONAA  --  >60  --   ANIONGAP  --  20*  --     Recent Labs  Lab 11/28/24 1809  PROT 7.0  ALBUMIN  3.8  AST 25  ALT 12  ALKPHOS 99  BILITOT 0.8   Lipids No results for input(s): CHOL, TRIG, HDL, LABVLDL, LDLCALC, CHOLHDL in the last 168 hours.  Hematology Recent Labs  Lab 11/28/24 1646 11/28/24 1753 11/28/24 1812  WBC 8.3  --   --   RBC 5.45*  --   --   HGB 16.7* 16.0* 16.3*  HCT 48.0* 47.0* 48.0*  MCV 88.1  --   --   MCH 30.6  --   --   MCHC 34.8  --   --    RDW 14.6  --   --   PLT 250  --   --    Thyroid  No  results for input(s): TSH, FREET4 in the last 168 hours.  BNP Recent Labs  Lab 11/28/24 1811  PROBNP 2,408.0*    DDimer No results for input(s): DDIMER in the last 168 hours.  Radiology/Studies:  DG Chest Port 1 View Result Date: 11/28/2024 CLINICAL DATA:  Shortness of breath, respiratory distress and hypoxia. EXAM: PORTABLE CHEST 1 VIEW COMPARISON:  11/20/2020 FINDINGS: The heart size and mediastinal contours are within normal limits. There is no evidence of pulmonary edema, consolidation, pneumothorax or pleural fluid. The visualized skeletal structures are unremarkable. IMPRESSION: No active disease. Electronically Signed   By: Marcey Moan M.D.   On: 11/28/2024 17:07     Assessment and Plan: ***   Risk Assessment/Risk Scores: {Complete the following score calculators/questions to meet required metrics.  Press F2         :[id number]}   {Is the patient being seen for unstable angina, ACS, NSTEMI or STEMI?:[id number]} {Does this patient have CHF or CHF symptoms?      :[id number]} {Does this patient have ATRIAL FIBRILLATION?:[phone]}  {Are we signing off today?:[id number]}  For questions or updates, please contact [facility] Please consult [email or web address] for contact info under    {TIP  Split Shared Billing  Do NOT delete any part of this including brackets If split shared billing is based upon MDM, disregard If billing will be based upon TIME you MUST document the number of minutes and a detailed list of what was done in that time in the following format Example - I spent ** minutes seeing this patient. During that time I reviewed their history, evaluated their symptoms, reviewed available labs, EKGs, studies, performed an exam and formulated an assessment and plan   :1} {Select this only if you need to document critical care time (Optional):[phone]} Signed, Laurene DELENA Pacini, MD  11/28/2024 8:15 PM         [1] No Known Allergies "

## 2024-11-28 NOTE — ED Notes (Signed)
 Transported pt to CT & back to treatment room

## 2024-11-28 NOTE — ED Notes (Signed)
 Pt too unstable at this time to attempt a rectal temp.

## 2024-11-28 NOTE — Progress Notes (Signed)
 Pt arrived in distress with expiratory wheezing. NRB 15L placed, 2 duonebs and 2 additional albuterol  treatments given per ED PA. Bipap attempted but pt became very anxious when mask was placed. Settings adjusted but she still felt like she was suffocating. Bipap removed and pt placed back on 15L HFNC. MD made aware. Orders given for RN to administer ativan  and retry bipap when pt is more able to tolerate.

## 2024-11-28 NOTE — Consult Note (Signed)
 "  Cardiology Consultation   Patient ID: Kathryn [name] MRN: [id number]; DOB: 12-29-1955  Admit date: 11/28/2024 Date of Consult: 11/28/2024  PCP:  Nichole Senior, MD   [facility] Providers Cardiologist:  Candyce Reek, MD  Electrophysiologist:  OLE ONEIDA HOLTS, MD  { Click here to update MD or APP on Care Team, Refresh:1}     Patient Profile: Kathryn [name] is a 69 y.o. female with a hx of atrial fibrillation, anemia who is being seen 11/28/2024 for the evaluation of *** at the request of the Emergency Department.  History of Present Illness: [name] ***   Patient follows up with lecture physiology for management of atrial fibrillation.  She is on Eliquis  for stroke reduction.  She was recently started on flecainide  50 mg twice daily for management of her atrial fibrillation.  She is on metoprolol  succinate 100 mg twice daily, and diltiazem  30 mg every 4 hours as needed.  TTE in 2024 showed an EF of 6065%, normal RV function, normal LA size, no evidence of TR.     Notable labs include K 2.4, serum creatinine 1.0, high-sensitivity troponin 16 -> 19, proBNP 2408, hemoglobin 16.3,  Past Medical History:  Diagnosis Date   Anemia    Anxiety    Arthritis    Breast cancer (HCC)    Right breast, had lumpectomy only no chemo, radiation   Cancer (HCC) 10/2016   Breast right,  lumpectomy,  did not require chemo/xrt   DDD (degenerative disc disease), cervical    Depression    Dyspnea    Dysrhythmia    A Fib   Elevated cholesterol    GERD (gastroesophageal reflux disease)    H/O hiatal hernia    Headache    Hypertension    Hypothyroidism    Neuromuscular disorder (HCC)    neuropathy BLE and Feet numbness   Paroxysmal atrial fibrillation (HCC) 11/20/2020   PONV (postoperative nausea and vomiting)    Tremor    in hands and down left leg   Wears partial dentures    upper    Past Surgical History:  Procedure Laterality Date   ABDOMINAL HYSTERECTOMY  yrs  ago   ANTERIOR LAT LUMBAR FUSION N/A 11/21/2019   Procedure: Lumbar two-three Lumbar three-four Anterolateral lumbar interbody fusion with lateral plate;  Surgeon: Colon Shove, MD;  Location: [facility] OR;  Service: Neurosurgery;  Laterality: N/A;   BIOPSY  11/23/2023   Procedure: BIOPSY;  Surgeon: Saintclair Jasper, MD;  Location: [facility];  Service: Gastroenterology;;   BOTOX  INJECTION N/A 11/23/2023   Procedure: BOTOX  INJECTION;  Surgeon: Saintclair Jasper, MD;  Location: [facility];  Service: Gastroenterology;  Laterality: N/A;   BREAST EXCISIONAL BIOPSY Right 11/2016   ALH and papillomas   BREAST LUMPECTOMY Right 2017   BREAST LUMPECTOMY WITH RADIOACTIVE SEED LOCALIZATION Right 12/15/2016   Procedure: RADIOACTIVE SEED X 2 GUIDED EXCISIONAL BREAST BIOPSY;  Surgeon: Donnice Bury, MD;  Location: [facility];  Service: General;  Laterality: Right;   CARPAL TUNNEL RELEASE Bilateral yrs ago   x 2   cervical neck fusion  yrs ago   C 3 and C 4    COLONOSCOPY     ESOPHAGOGASTRODUODENOSCOPY (EGD) WITH PROPOFOL  N/A 11/23/2023   Procedure: ESOPHAGOGASTRODUODENOSCOPY (EGD) WITH PROPOFOL ;  Surgeon: Saintclair Jasper, MD;  Location: [facility];  Service: Gastroenterology;  Laterality: N/A;   ESOPHAGOGASTRODUODENOSCOPY ENDOSCOPY N/A 01/09/2022   Procedure: INTRAOPERATIVE ENDOSCOPY;  Surgeon: Rubin Calamity, MD;  Location: [facility] OR;  Service:  General;  Laterality: N/A;   EYE SURGERY Bilateral    Cataracts removed   left knee arthroscopy   last done 2008   x 3   left shoulder arthroscopy  yrs ago   lower back surgery  2005   right total knee replacement   2008   TOTAL HIP ARTHROPLASTY Left 04/08/2023   Procedure: TOTAL HIP ARTHROPLASTY ANTERIOR APPROACH;  Surgeon: Fidel Rogue, MD;  Location: [facility];  Service: Orthopedics;  Laterality: Left;  150   TOTAL HIP ARTHROPLASTY Right 06/21/2024   Procedure: ARTHROPLASTY, HIP, TOTAL, ANTERIOR APPROACH;  Surgeon: Fidel Rogue, MD;  Location: [facility];   Service: Orthopedics;  Laterality: Right;   TOTAL KNEE ARTHROPLASTY  01/14/2012   Procedure: TOTAL KNEE ARTHROPLASTY;  Surgeon: Reyes JAYSON Billing, MD;  Location: [facility];  Service: Orthopedics;  Laterality: Left;  preop femoral nerve block   UPPER GI ENDOSCOPY     WISDOM TOOTH EXTRACTION       Home Medications:  Prior to Admission medications  Medication Sig Start Date End Date Taking? Authorizing Provider  acetaminophen  (TYLENOL ) 500 MG tablet Take 500-1,000 mg by mouth every 6 (six) hours as needed (pain.).    [provider]  buPROPion (WELLBUTRIN XL) 150 MG 24 hr tablet Take 150 mg by mouth every morning. 09/20/24   [provider]  Camphor-Menthol -Methyl Sal (SALONPAS) 3.11-28-08 % PTCH Place 1 patch onto the skin daily as needed (left hip pain).    [provider]  chlorhexidine  (HIBICLENS ) 4 % external liquid Apply 15 mLs (1 Application total) topically as directed for 30 doses. Use as directed daily for 5 days every other week for 6 weeks. 06/21/24   Leigh Valery RAMAN, PA-C  Cholecalciferol (D3 PO) Take 1 tablet by mouth daily.    [provider]  colchicine  0.6 MG tablet Take 0.6 mg by mouth as needed (gout flare). 09/04/21   [provider]  Cyanocobalamin  (VITAMIN B-12 PO) Take 1 tablet by mouth in the morning.    [provider]  diltiazem  (CARDIZEM  CD) 120 MG 24 hr capsule Take 120 mg by mouth. 08/18/24   [provider]  diltiazem  (CARDIZEM ) 30 MG tablet Take 1 tablet every 4 hours AS NEEDED for heart rate >100 as long as blood pressure >100. 10/06/23   Terra Fairy PARAS, PA-C  DULoxetine  (CYMBALTA ) 30 MG capsule Take 30 mg by mouth every evening. 30 mg + 60 mg=90 mg 09/08/22   [provider]  DULoxetine  (CYMBALTA ) 60 MG capsule Take 60 mg by mouth every evening. 60 mg + 30 mg=90 mg    [provider]  ELIQUIS  5 MG TABS tablet Take 5 mg by mouth 2 (two) times daily. 09/27/24   [provider]  flecainide   (TAMBOCOR ) 50 MG tablet Take 1 tablet (50 mg total) by mouth 2 (two) times daily. 10/23/24   Cindie Ole DASEN, MD  furosemide  (LASIX ) 20 MG tablet TAKE 1 TABLET BY MOUTH DAILY AS NEEDED 02/17/23   Cindie Ole DASEN, MD  gabapentin  (NEURONTIN ) 600 MG tablet Take 600 mg by mouth 3 (three) times daily. 08/03/22   [provider]  hydrochlorothiazide  (HYDRODIURIL ) 25 MG tablet Take 1 tablet by mouth once daily 06/01/24   Cindie Ole DASEN, MD  levothyroxine  (SYNTHROID ) 200 MCG tablet Take 200 mcg by mouth daily before breakfast.    [provider]  methocarbamol  (ROBAXIN ) 500 MG tablet Take 500 mg by mouth 4 (four) times daily.    [provider]  metoprolol  succinate (TOPROL -XL) 100 MG 24 hr tablet TAKE 1 TABLET BY MOUTH DAILY WITH OR IMMEDIATLY FOLLOWING A MEAL 03/03/24   Cindie Ole DASEN, MD  MOVANTIK 25 MG TABS tablet Take 25 mg by mouth every morning. 10/12/24   [provider]  omeprazole  (PRILOSEC ) 40 MG capsule Take 40 mg by mouth 2 (two) times daily. 10/13/24   [provider]  oxyCODONE  HCl 7.5 MG TABS Take 7.5 mg by mouth every 4 (four) hours as needed (severe postoperative pain). 06/22/24   Hill, Valery RAMAN, PA-C  oxyCODONE -acetaminophen  (PERCOCET) 7.5-325 MG tablet Take 1 tablet by mouth every 8 (eight) hours as needed.    [provider]  pantoprazole  (PROTONIX ) 40 MG tablet Take 40 mg by mouth 2 (two) times daily. Patient not taking: Reported on 10/23/2024 02/09/24   [provider]  rivaroxaban  (XARELTO ) 20 MG TABS tablet Take 1 tablet (20 mg total) by mouth daily with supper. Patient not taking: Reported on 10/23/2024 09/26/24   Cindie Ole T, MD    Scheduled Meds:  Continuous Infusions:  azithromycin  500 mg (11/28/24 1941)   diltiazem  (CARDIZEM ) infusion 15 mg/hr (11/28/24 1930)   potassium chloride  10 mEq (11/28/24 1943)   PRN Meds:   Allergies:   Allergies[1]  Social History:   Social History   Socioeconomic  History   Marital status: Married    Spouse name: Not on file   Number of children: Not on file   Years of education: Not on file   Highest education level: Not on file  Occupational History   Not on file  Tobacco Use   Smoking status: Never   Smokeless tobacco: Never  Vaping Use   Vaping status: Never Used  Substance and Sexual Activity   Alcohol  use: Not Currently    Comment: occasional wine   Drug use: No   Sexual activity: Not Currently    Birth control/protection: Post-menopausal    Comment: Hysterectormy  Other Topics Concern   Not on file  Social History Narrative   Lives in [place] with spouse.   Retired marine scientist   Social Drivers of Health   Tobacco Use: Low Risk (10/23/2024)   Patient History    Smoking Tobacco Use: Never    Smokeless Tobacco Use: Never    Passive Exposure: Not on file  Financial Resource Strain: Low Risk (08/18/2024)   Received [facility]   Overall Financial Resource Strain (CARDIA)    How hard is it for you to pay for the very basics like food, housing, medical care, and heating?: Not very hard  Food Insecurity: No Food Insecurity (08/18/2024)   Received from [facility]   Epic    Within the past 12 months, you worried that your food would run out before you got the money to buy more.: Never true    Within the past 12 months, the food you bought just didn't last and you didn't have money to get more.: Never true  Transportation Needs: No Transportation Needs (08/18/2024)   Received from [facility]   Epic    In the past 12 months, has lack of transportation kept you from medical appointments or from getting medications?: No    In the past 12 months, has lack of transportation kept you from meetings, work, or from getting things needed for daily living?: No  Physical Activity: Insufficiently Active (08/18/2024)   Received from [facility]   Exercise Vital Sign    On average, how many  days per week do you engage  in moderate to strenuous exercise (like a brisk walk)?: 3 days    On average, how many minutes do you engage in exercise at this level?: 30 min  Stress: No Stress Concern Present (08/18/2024)   Received from [facility] - Occupational Stress Questionnaire    Do you feel stress - tense, restless, nervous, or anxious, or unable to sleep at night because your mind is troubled all the time - these days?: Only a little  Social Connections: Socially Integrated (08/18/2024)   Received from [facility]   Social Connection and Isolation Panel    In a typical week, how many times do you talk on the phone with family, friends, or neighbors?: Three times a week    How often do you get together with friends or relatives?: Twice a week    How often do you attend church or religious services?: 1 to 4 times per year    Do you belong to any clubs or organizations such as church groups, unions, fraternal or athletic groups, or school groups?: Yes    How often do you attend meetings of the clubs or organizations you belong to?: 1 to 4 times per year    Are you married, widowed, divorced, separated, never married, or living with a partner?: Married  Intimate Partner Violence: Not At Risk (08/18/2024)   Received from [facility]   Epic    Within the last year, have you been afraid of your partner or ex-partner?: No    Within the last year, have you been humiliated or emotionally abused in other ways by your partner or ex-partner?: No    Within the last year, have you been kicked, hit, slapped, or otherwise physically hurt by your partner or ex-partner?: No    Within the last year, have you been raped or forced to have any kind of sexual activity by your partner or ex-partner?: No  Depression (PHQ2-9): Not on file  Alcohol  Screen: Not on file  Housing: Low Risk (08/18/2024)   Received from [facility]   Epic    At any time in the past 12 months, were you homeless  or living in a shelter (including now)?: No    In the past 12 months, how many times have you moved where you were living?: 0    In the last 12 months, was there a time when you were not able to pay the mortgage or rent on time?: No  Utilities: Not At Risk (08/18/2024)   Received from [facility]    In the past 12 months has the electric, gas, oil, or water  company threatened to shut off services in your home?: No  Health Literacy: Adequate Health Literacy (08/18/2024)   Received from [facility]   B1300 Health Literacy    How often do you need to have someone help you when you read instructions, pamphlets, or other written material from your doctor or pharmacy?: Never    Family History:    Family History  Problem Relation Age of Onset   Heart disease Father    Stroke Brother    Breast cancer Neg Hx      ROS:  Please see the history of present illness.   All other ROS reviewed and negative.     Physical Exam/Data: Vitals:   11/28/24 1940 11/28/24 1945 11/28/24 1950 11/28/24 1952  BP: (!) 104/94 91/61 111/79  Pulse: 80 (!) 160 (!) 133   Resp: (!) 21 15 15    Temp:    97.6 F (36.4 C)  TempSrc:    Oral  SpO2: 100% 100% 97%     Intake/Output Summary (Last 24 hours) at 11/28/2024 2015 Last data filed at 11/28/2024 1930 Gross per 24 hour  Intake 157.32 ml  Output --  Net 157.32 ml      10/23/2024   11:24 AM 06/21/2024    8:12 AM 06/14/2024   11:23 AM  Last 3 Weights  Weight (lbs) 195 lb 1.6 oz 197 lb 197 lb  Weight (kg) 88.497 kg 89.359 kg 89.359 kg     There is no height or weight on file to calculate BMI.  General:  Well nourished, well developed, in no acute distress*** HEENT: normal Neck: no JVD Vascular: No carotid bruits; Distal pulses 2+ bilaterally Cardiac:  normal S1, S2; RRR; no murmur *** Lungs:  clear to auscultation bilaterally, no wheezing, rhonchi or rales  Abd: soft, nontender, no hepatomegaly  Ext: no edema Musculoskeletal:  No deformities,  BUE and BLE strength normal and equal Skin: warm and dry  Neuro:  CNs 2-12 intact, no focal abnormalities noted Psych:  Normal affect   EKG:  The EKG was personally reviewed and demonstrates: Atrial fibrillation with rapid ventricular rates Telemetry:  Telemetry was personally reviewed and demonstrates:  ***  Relevant CV Studies: ***  Laboratory Data: High Sensitivity Troponin:  No results for input(s): TROPONINIHS in the last 720 hours.  Recent Labs  Lab 11/28/24 1811  TRNPT 16      Chemistry Recent Labs  Lab 11/28/24 1753 11/28/24 1809 11/28/24 1812  NA 134* 135 134*  K 2.5* 2.4* 2.4*  CL  --  91* 94*  CO2  --  24  --   GLUCOSE  --  158* 162*  BUN  --  20 21  CREATININE  --  0.91 1.00  CALCIUM  --  9.1  --   GFRNONAA  --  >60  --   ANIONGAP  --  20*  --     Recent Labs  Lab 11/28/24 1809  PROT 7.0  ALBUMIN  3.8  AST 25  ALT 12  ALKPHOS 99  BILITOT 0.8   Lipids No results for input(s): CHOL, TRIG, HDL, LABVLDL, LDLCALC, CHOLHDL in the last 168 hours.  Hematology Recent Labs  Lab 11/28/24 1646 11/28/24 1753 11/28/24 1812  WBC 8.3  --   --   RBC 5.45*  --   --   HGB 16.7* 16.0* 16.3*  HCT 48.0* 47.0* 48.0*  MCV 88.1  --   --   MCH 30.6  --   --   MCHC 34.8  --   --   RDW 14.6  --   --   PLT 250  --   --    Thyroid  No results for input(s): TSH, FREET4 in the last 168 hours.  BNP Recent Labs  Lab 11/28/24 1811  PROBNP 2,408.0*    DDimer No results for input(s): DDIMER in the last 168 hours.  Radiology/Studies:  DG Chest Port 1 View Result Date: 11/28/2024 CLINICAL DATA:  Shortness of breath, respiratory distress and hypoxia. EXAM: PORTABLE CHEST 1 VIEW COMPARISON:  11/20/2020 FINDINGS: The heart size and mediastinal contours are within normal limits. There is no evidence of pulmonary edema, consolidation, pneumothorax or pleural fluid. The visualized skeletal structures are unremarkable. IMPRESSION: No active disease.  Electronically Signed   By: Marcey Moan  M.D.   On: 11/28/2024 17:07     Assessment and Plan: ***   Risk Assessment/Risk Scores: {Complete the following score calculators/questions to meet required metrics.  Press F2         :[id number]}   {Is the patient being seen for unstable angina, ACS, NSTEMI or STEMI?:[phone]} {Does this patient have CHF or CHF symptoms?      :[id number]} {Does this patient have ATRIAL FIBRILLATION?:[phone]}  {Are we signing off today?:[id number]}  For questions or updates, please contact  [facility] Please consult [email or web address] for contact info under    {TIP  Split Shared Billing  Do NOT delete any part of this including brackets If split shared billing is based upon MDM, disregard If billing will be based upon TIME you MUST document the number of minutes and a detailed list of what was done in that time in the following format Example - I spent ** minutes seeing this patient. During that time I reviewed their history, evaluated their symptoms, reviewed available labs, EKGs, studies, performed an exam and formulated an assessment and plan   :1} {Select this only if you need to document critical care time (Optional):[phone]} Signed, Laurene DELENA Pacini, MD  11/28/2024 8:15 PM      [1] No Known Allergies  "

## 2024-11-28 NOTE — ED Notes (Signed)
 Unsuccessful IV attempt by this RN. Awaiting US  IV placement.

## 2024-11-28 NOTE — ED Notes (Signed)
 Pt's bp decreasing, pt seems physically agitated with complaints of restless leg syndrome. Pt repositioned & EDP made aware of pt's request for medication to treat restless leg syndrome. No new orders at this time.

## 2024-11-28 NOTE — ED Triage Notes (Signed)
 PT bibems from home. Respiratory distress. 76% on RA upon EMS arrival. Per EMS pt was gray and blue in the face. O2 increase to 88% on nonrebreather. HR 280. 6 then 12 of adenosine  given. 20 total of cardizem  given on scene. Hx of afib.  BP 130/100 HR 80-130

## 2024-11-28 NOTE — ED Notes (Signed)
 EDP made aware that pt's HR sustaining 150-160bpm, Cardizem  drip was increased to 15mg /hour per order however pt's bp not able to maintain within parameters. Notified that Cardizem  was decreased back to 10mg /hour. No new orders at this time.

## 2024-11-28 NOTE — ED Notes (Signed)
 EDP called & notified of pts VS & not being able to reach therapeutic range with current orders. No new orders at this time, was notified by EDP cardiology had been consulted, they were on the way & would advise further.

## 2024-11-28 NOTE — ED Provider Notes (Signed)
 " [facility] Provider Note   CSN: [id number] Arrival date & time: 11/28/24  1529     Patient presents with: Respiratory Distress   Kathryn [name] is a 69 y.o. female who arrives with respiratory distress.  History is gathered at bedside from the patient and her husband.  He reports that she has been wheezing for the past several days.  This is new.  Has been progressively worsened since Sunday.  He reports that she does not have a history of asthma.  She has had some diarrhea and bodyaches and poor appetite. Today she appeared to be in severe respiratory distress was barely able to speak.  EMS reports that when they arrived she had a heart rate above 200 that appeared to be SVT she was given adenosine  x 2 with improvement in her right.  She was also given Cardizem  IV 20 mg because she has not taken any of her medication in the past 2 days.  Patient also is on Eliquis  and has not taken her Eliquis  for 2 days. No further history is able to be obtained from the patient at this time due to her respiratory status.  Placed on BiPAP.   HPI     Prior to Admission medications  Medication Sig Start Date End Date Taking? Authorizing Provider  acetaminophen  (TYLENOL ) 500 MG tablet Take 500-1,000 mg by mouth every 6 (six) hours as needed (pain.).    [provider]  buPROPion (WELLBUTRIN XL) 150 MG 24 hr tablet Take 150 mg by mouth every morning. 09/20/24   [provider]  Camphor-Menthol -Methyl Sal (SALONPAS) 3.11-28-08 % PTCH Place 1 patch onto the skin daily as needed (left hip pain).    [provider]  chlorhexidine  (HIBICLENS ) 4 % external liquid Apply 15 mLs (1 Application total) topically as directed for 30 doses. Use as directed daily for 5 days every other week for 6 weeks. 06/21/24   Leigh Valery RAMAN, PA-C  Cholecalciferol (D3 PO) Take 1 tablet by mouth daily.    [provider]  colchicine  0.6 MG tablet Take 0.6  mg by mouth as needed (gout flare). 09/04/21   [provider]  Cyanocobalamin  (VITAMIN B-12 PO) Take 1 tablet by mouth in the morning.    [provider]  diltiazem  (CARDIZEM  CD) 120 MG 24 hr capsule Take 120 mg by mouth. 08/18/24   [provider]  diltiazem  (CARDIZEM ) 30 MG tablet Take 1 tablet every 4 hours AS NEEDED for heart rate >100 as long as blood pressure >100. 10/06/23   Terra Fairy PARAS, PA-C  DULoxetine  (CYMBALTA ) 30 MG capsule Take 30 mg by mouth every evening. 30 mg + 60 mg=90 mg 09/08/22   [provider]  DULoxetine  (CYMBALTA ) 60 MG capsule Take 60 mg by mouth every evening. 60 mg + 30 mg=90 mg    [provider]  ELIQUIS  5 MG TABS tablet Take 5 mg by mouth 2 (two) times daily. 09/27/24   [provider]  flecainide  (TAMBOCOR ) 50 MG tablet Take 1 tablet (50 mg total) by mouth 2 (two) times daily. 10/23/24   Cindie Ole DASEN, MD  furosemide  (LASIX ) 20 MG tablet TAKE 1 TABLET BY MOUTH DAILY AS NEEDED 02/17/23   Cindie Ole DASEN, MD  gabapentin  (NEURONTIN ) 600 MG tablet Take 600 mg by mouth 3 (three) times daily. 08/03/22   [provider]  hydrochlorothiazide  (HYDRODIURIL ) 25 MG tablet Take 1 tablet by mouth once daily 06/01/24  Cindie Ole DASEN, MD  levothyroxine  (SYNTHROID ) 200 MCG tablet Take 200 mcg by mouth daily before breakfast.    [provider]  methocarbamol  (ROBAXIN ) 500 MG tablet Take 500 mg by mouth 4 (four) times daily.    [provider]  metoprolol  succinate (TOPROL -XL) 100 MG 24 hr tablet TAKE 1 TABLET BY MOUTH DAILY WITH OR IMMEDIATLY FOLLOWING A MEAL 03/03/24   Cindie Ole DASEN, MD  MOVANTIK 25 MG TABS tablet Take 25 mg by mouth every morning. 10/12/24   [provider]  omeprazole  (PRILOSEC ) 40 MG capsule Take 40 mg by mouth 2 (two) times daily. 10/13/24   [provider]  oxyCODONE  HCl 7.5 MG TABS Take 7.5 mg by mouth every 4 (four) hours as needed (severe  postoperative pain). 06/22/24   Hill, Valery RAMAN, PA-C  oxyCODONE -acetaminophen  (PERCOCET) 7.5-325 MG tablet Take 1 tablet by mouth every 8 (eight) hours as needed.    [provider]  pantoprazole  (PROTONIX ) 40 MG tablet Take 40 mg by mouth 2 (two) times daily. Patient not taking: Reported on 10/23/2024 02/09/24   [provider]  rivaroxaban  (XARELTO ) 20 MG TABS tablet Take 1 tablet (20 mg total) by mouth daily with supper. Patient not taking: Reported on 10/23/2024 09/26/24   Cindie Ole DASEN, MD    Allergies: Patient has no known allergies.    Review of Systems  Updated Vital Signs LMP  (LMP Unknown)   SpO2 100%   Physical Exam Vitals and nursing note reviewed.  Constitutional:      General: She is not in acute distress.    Appearance: She is well-developed. She is not diaphoretic.  HENT:     Head: Normocephalic and atraumatic.     Right Ear: External ear normal.     Left Ear: External ear normal.     Nose: Nose normal.     Mouth/Throat:     Mouth: Mucous membranes are moist.  Eyes:     General: No scleral icterus.    Conjunctiva/sclera: Conjunctivae normal.  Cardiovascular:     Rate and Rhythm: Normal rate and regular rhythm.     Heart sounds: Normal heart sounds. No murmur heard.    No friction rub. No gallop.  Pulmonary:     Effort: Respiratory distress present.     Breath sounds: Wheezing present.     Comments: Prolonged expiratory phase, tachypnea, diffuse wheezing, poor air movement. Abdominal:     General: Bowel sounds are normal. There is no distension.     Palpations: Abdomen is soft. There is no mass.     Tenderness: There is no abdominal tenderness. There is no guarding.  Musculoskeletal:     Cervical back: Normal range of motion.  Skin:    General: Skin is warm and dry.  Neurological:     Mental Status: She is alert and oriented to person, place, and time.  Psychiatric:        Behavior: Behavior normal.     (all labs ordered are  listed, but only abnormal results are displayed) Labs Reviewed  RESP PANEL BY RT-PCR (RSV, FLU A&B, COVID)  [id number]  COMPREHENSIVE METABOLIC PANEL WITH GFR  CBC    EKG: EKG Interpretation Date/Time:  Tuesday November 28 2024 17:48:46 EST Ventricular Rate:  175 PR Interval:  161 QRS Duration:  127 QT Interval:  258 QTC Calculation: 441 R Axis:   58  Text Interpretation: Atrial fibrillation with rapid ventricular response Ventricular premature complex Left bundle branch block Non-specific ST-t  changes Confirmed by Bernard Drivers ([id number]) on 11/29/2024 3:30:16 PM  Radiology: No results found.   .Critical Care  Performed by: Arloa Chroman, PA-C Authorized by: Arloa Chroman, PA-C   Critical care provider statement:    Critical care time (minutes):  80   Critical care time was exclusive of:  Separately billable procedures and treating other patients   Critical care was necessary to treat or prevent imminent or life-threatening deterioration of the following conditions:  Respiratory failure and cardiac failure   Critical care was time spent personally by me on the following activities:  Development of treatment plan with patient or surrogate, discussions with consultants, evaluation of patient's response to treatment, examination of patient, interpretation of cardiac output measurements, obtaining history from patient or surrogate, ordering and performing treatments and interventions, ordering and review of laboratory studies, ordering and review of radiographic studies, pulse oximetry and re-evaluation of patient's condition   Care discussed with: admitting provider      Medications Ordered in the ED  ipratropium-albuterol  (DUONEB) 0.5-2.5 (3) MG/3ML nebulizer solution 3 mL (has no administration in time range)  albuterol  (PROVENTIL ) (2.5 MG/3ML) 0.083% nebulizer solution 5 mg (has no administration in time range)  methylPREDNISolone  sodium succinate (SOLU-MEDROL ) 125 mg/2 mL injection  125 mg (has no administration in time range)  magnesium  sulfate IVPB 2 g 50 mL (has no administration in time range)  LORazepam  (ATIVAN ) injection 1 mg (has no administration in time range)  albuterol  (PROVENTIL ) (2.5 MG/3ML) 0.083% nebulizer solution (2.5 mg  Given 11/28/24 1557)  ipratropium-albuterol  (DUONEB) 0.5-2.5 (3) MG/3ML nebulizer solution (3 mLs  Given 11/28/24 1556)    Clinical Course as of 11/29/24 0024  Tue Nov 28, 2024  1656 WBC: 8.3 [AH]  1706 Patient reevaluated at bedside.  She continues to have prolonged expiratory wheeze, not in significant respiratory distress but still with extremely labored breathing.  Heart rate is very tachycardic we will repeat an EKG. [AH]  1823 Potassium(!!): 2.4 [AH]  2251 Patient became hypotensive, Diltiazem  drip stopped and blood pressure came up. Remains tachycardic. Patient is still breathing 30+ times a minute - prolonged expiration and diffuse wheezing. Continues to Refuse bipap. Patient is more lethargic.  Seen at Bedside by Dr. Gail  who is reccommending amiodarone  bolus and drip. Patient does not appear volume overloaded.  Bedside echo performed shows no evidence of pericardial effusion.  [AH]    Clinical Course User Index [AH] Arloa Chroman, PA-C                                 Medical Decision Making Amount and/or Complexity of Data Reviewed Labs: ordered. Decision-making details documented in ED Course. Radiology: ordered.  Risk Prescription drug management. Decision regarding hospitalization.   This patient presents to the ED for concern of respiratory distress, this involves an extensive number of treatment options, and is a complaint that carries with it a high risk of complications and morbidity.  The emergent differential diagnosis for shortness of breath includes, but is not limited to, Pulmonary edema, bronchoconstriction, Pneumonia, Pulmonary embolism, Pneumotherax/ Hemothorax, Dysrythmia, ACS.    Co morbidities:    has a past medical history of Anemia, Anxiety, Arthritis, Breast cancer (HCC), Cancer (HCC) (10/2016), DDD (degenerative disc disease), cervical, Depression, Dyspnea, Dysrhythmia, Elevated cholesterol, GERD (gastroesophageal reflux disease), H/O hiatal hernia, Headache, Hypertension, Hypothyroidism, Neuromuscular disorder (HCC), Paroxysmal atrial fibrillation (HCC) (11/20/2020), PONV (postoperative nausea and vomiting), Tremor, and Wears partial dentures.  Social Determinants of Health:   SDOH Screenings   Food Insecurity: No Food Insecurity (11/29/2024)  Housing: Low Risk (11/29/2024)  Transportation Needs: No Transportation Needs (11/29/2024)  Utilities: Not At Risk (11/29/2024)  Financial Resource Strain: Low Risk (08/18/2024)   Received from [facility]  Physical Activity: Insufficiently Active (08/18/2024)   Received from [facility]  Social Connections: Socially Integrated (11/29/2024)  Stress: No Stress Concern Present (08/18/2024)   Received from [facility]  Tobacco Use: Low Risk (11/28/2024)  Health Literacy: Adequate Health Literacy (08/18/2024)   Received from [facility]     Additional history:  {Additional history obtained from husband/ ems   Lab Tests:  I Ordered, and personally interpreted labs.  The pertinent results include:   Troponin within normal limits, respiratory panel is negative for COVID or influenza.  Patient's potassium is 2.5 may be secondary to multiple rounds of albuterol  versus GI loss from diarrhea. BNP is 2400.     Imaging Studies:  I ordered imaging studies including CT angiogram of the chest with PE study, portable 1 view chest x-ray I independently visualized and interpreted imaging which showed no acute findings I agree with the radiologist interpretation  Cardiac Monitoring/ECG:  The patient was maintained on a cardiac monitor.  I personally viewed and interpreted the cardiac monitored which showed an underlying rhythm of:  EKG shows  A-fib with aberrancy due to rapid ventricular rate.  Medicines ordered and prescription drug management:  I ordered medication including  Multiple rounds of albuterol  and ipratropium, IV magnesium , Solu-Medrol  125, Ativan  1 mg, diltiazem  drip, potassium run x 6, amiodarone  bolus and drip for respiratory distress, intolerance of BiPAP, coverage for community-acquired pneumonia, and rate control for A-fib Reevaluation of the patient after these medicines showed that the patient improved I have reviewed the patients home medicines and have made adjustments as needed  Test Considered:    Critical Interventions:   multiple reevaluations, IV potassium, rate control medications IV, BiPAP, high flow nasal cannula due to hypoxic respiratory failure.  Consultations Obtained: Discussed the case with Dr. Gail at bedside who helped with her rate control, Dr. Adefeso for admission, I considered speaking with our critical care team however  Problem List / ED Course:     ICD-10-CM   1. Acute respiratory failure with hypoxia (HCC)  J96.01     2. Tachycardia  R00.0       MDM: Patient here with respiratory distress.  She was in A-fib with RVR.  No previous history of COPD or asthma.  No evidence of a pulmonary embolus.  After consultation with Dr. Gail the current consideration is that she likely has unopposed flecainide  causing her tachycardia in the setting of hypokalemia.  She seemed to improve significantly with both her breathing rate and her tachycardia with the amiodarone  bolus and drip.  Patient is still in serious condition with acute hypoxic respiratory failure.  She will need admission.   Dispostion:  After consideration of the diagnostic results and the patients response to treatment, I feel that the patent would benefit from admission and further workup and monitoring.      Final diagnoses:  Acute respiratory failure with hypoxia [facility])  Tachycardia    ED Discharge Orders      None          Arloa Chroman, PA-C 11/30/24 1725  "

## 2024-11-29 ENCOUNTER — Inpatient Hospital Stay (HOSPITAL_COMMUNITY)

## 2024-11-29 DIAGNOSIS — T461X5A Adverse effect of calcium-channel blockers, initial encounter: Secondary | ICD-10-CM | POA: Diagnosis present

## 2024-11-29 DIAGNOSIS — F32A Depression, unspecified: Secondary | ICD-10-CM | POA: Diagnosis present

## 2024-11-29 DIAGNOSIS — E66811 Obesity, class 1: Secondary | ICD-10-CM

## 2024-11-29 DIAGNOSIS — I5031 Acute diastolic (congestive) heart failure: Secondary | ICD-10-CM

## 2024-11-29 DIAGNOSIS — M5416 Radiculopathy, lumbar region: Secondary | ICD-10-CM | POA: Diagnosis present

## 2024-11-29 DIAGNOSIS — I5033 Acute on chronic diastolic (congestive) heart failure: Secondary | ICD-10-CM

## 2024-11-29 DIAGNOSIS — R197 Diarrhea, unspecified: Secondary | ICD-10-CM | POA: Diagnosis present

## 2024-11-29 DIAGNOSIS — I350 Nonrheumatic aortic (valve) stenosis: Secondary | ICD-10-CM | POA: Diagnosis present

## 2024-11-29 DIAGNOSIS — B9789 Other viral agents as the cause of diseases classified elsewhere: Secondary | ICD-10-CM | POA: Diagnosis present

## 2024-11-29 DIAGNOSIS — K220 Achalasia of cardia: Secondary | ICD-10-CM | POA: Diagnosis present

## 2024-11-29 DIAGNOSIS — I480 Paroxysmal atrial fibrillation: Secondary | ICD-10-CM | POA: Diagnosis present

## 2024-11-29 DIAGNOSIS — E871 Hypo-osmolality and hyponatremia: Secondary | ICD-10-CM | POA: Diagnosis not present

## 2024-11-29 DIAGNOSIS — E039 Hypothyroidism, unspecified: Secondary | ICD-10-CM | POA: Diagnosis present

## 2024-11-29 DIAGNOSIS — I2510 Atherosclerotic heart disease of native coronary artery without angina pectoris: Secondary | ICD-10-CM | POA: Diagnosis present

## 2024-11-29 DIAGNOSIS — R011 Cardiac murmur, unspecified: Secondary | ICD-10-CM | POA: Diagnosis present

## 2024-11-29 DIAGNOSIS — Z7901 Long term (current) use of anticoagulants: Secondary | ICD-10-CM | POA: Diagnosis not present

## 2024-11-29 DIAGNOSIS — J9601 Acute respiratory failure with hypoxia: Secondary | ICD-10-CM | POA: Diagnosis present

## 2024-11-29 DIAGNOSIS — I952 Hypotension due to drugs: Secondary | ICD-10-CM | POA: Diagnosis present

## 2024-11-29 DIAGNOSIS — I110 Hypertensive heart disease with heart failure: Secondary | ICD-10-CM | POA: Diagnosis present

## 2024-11-29 DIAGNOSIS — K219 Gastro-esophageal reflux disease without esophagitis: Secondary | ICD-10-CM | POA: Diagnosis present

## 2024-11-29 DIAGNOSIS — R0602 Shortness of breath: Secondary | ICD-10-CM | POA: Diagnosis present

## 2024-11-29 DIAGNOSIS — J209 Acute bronchitis, unspecified: Secondary | ICD-10-CM

## 2024-11-29 DIAGNOSIS — I10 Essential (primary) hypertension: Secondary | ICD-10-CM | POA: Diagnosis not present

## 2024-11-29 DIAGNOSIS — E7800 Pure hypercholesterolemia, unspecified: Secondary | ICD-10-CM | POA: Diagnosis present

## 2024-11-29 DIAGNOSIS — E876 Hypokalemia: Secondary | ICD-10-CM

## 2024-11-29 DIAGNOSIS — G629 Polyneuropathy, unspecified: Secondary | ICD-10-CM | POA: Diagnosis present

## 2024-11-29 DIAGNOSIS — I080 Rheumatic disorders of both mitral and aortic valves: Secondary | ICD-10-CM | POA: Diagnosis present

## 2024-11-29 DIAGNOSIS — Z1152 Encounter for screening for COVID-19: Secondary | ICD-10-CM | POA: Diagnosis not present

## 2024-11-29 LAB — ECHOCARDIOGRAM COMPLETE
AR max vel: 1.77 cm2
AV Area VTI: 1.79 cm2
AV Area mean vel: 1.65 cm2
AV Mean grad: 15 mmHg
AV Peak grad: 25 mmHg
Ao pk vel: 2.5 m/s
Area-P 1/2: 3.54 cm2
Height: 64 in
S' Lateral: 3.6 cm
Weight: 3121.71 [oz_av]

## 2024-11-29 LAB — MAGNESIUM
Magnesium: 2.6 mg/dL — ABNORMAL HIGH (ref 1.7–2.4)
Magnesium: 2.5 mg/dL — ABNORMAL HIGH (ref 1.7–2.4)

## 2024-11-29 LAB — RESPIRATORY PANEL BY PCR

## 2024-11-29 LAB — COMPREHENSIVE METABOLIC PANEL WITH GFR
ALT: 8 U/L (ref 0–44)
AST: 20 U/L (ref 15–41)
Albumin: 3.8 g/dL (ref 3.5–5.0)
Alkaline Phosphatase: 87 U/L (ref 38–126)
Anion gap: 15 (ref 5–15)
BUN: 22 mg/dL (ref 8–23)
CO2: 25 mmol/L (ref 22–32)
Calcium: 8.9 mg/dL (ref 8.9–10.3)
Chloride: 93 mmol/L — ABNORMAL LOW (ref 98–111)
Creatinine, Ser: 0.8 mg/dL (ref 0.44–1.00)
GFR, Estimated: >60 mL/min
Glucose, Bld: 167 mg/dL — ABNORMAL HIGH (ref 70–99)
Potassium: 3.3 mmol/L — ABNORMAL LOW (ref 3.5–5.1)
Sodium: 133 mmol/L — ABNORMAL LOW (ref 135–145)
Total Bilirubin: 0.4 mg/dL (ref 0.0–1.2)
Total Protein: 6.7 g/dL (ref 6.5–8.1)

## 2024-11-29 LAB — PHOSPHORUS: Phosphorus: 2.7 mg/dL (ref 2.5–4.6)

## 2024-11-29 LAB — CBC
HCT: 41.5 % (ref 36.0–46.0)
Hemoglobin: 14.2 g/dL (ref 12.0–15.0)
MCH: 30.3 pg (ref 26.0–34.0)
MCHC: 34.2 g/dL (ref 30.0–36.0)
MCV: 88.7 fL (ref 80.0–100.0)
Platelets: 239 K/uL (ref 150–400)
RBC: 4.68 MIL/uL (ref 3.87–5.11)
RDW: 14.8 % (ref 11.5–15.5)
WBC: 6.7 K/uL (ref 4.0–10.5)
nRBC: 0 % (ref 0.0–0.2)

## 2024-11-29 LAB — HIV ANTIBODY (ROUTINE TESTING W REFLEX): HIV Screen 4th Generation wRfx: NONREACTIVE

## 2024-11-29 MED ORDER — PERFLUTREN LIPID MICROSPHERE
1.0000 mL | INTRAVENOUS | Status: AC | PRN
  Administered 2024-11-29: 2 mL via INTRAVENOUS

## 2024-11-29 MED ORDER — ACETAMINOPHEN 325 MG PO TABS
650.0000 mg | ORAL_TABLET | Freq: Four times a day (QID) | ORAL | Status: DC | PRN
  Administered 2024-11-29 – 2024-12-01 (×5): 650 mg via ORAL
  Filled 2024-11-29 (×5): qty 2

## 2024-11-29 MED ORDER — POTASSIUM CHLORIDE CRYS ER 20 MEQ PO TBCR: 40.0000 meq | EXTENDED_RELEASE_TABLET | ORAL | Status: AC

## 2024-11-29 MED ORDER — ROPINIROLE HCL 1 MG PO TABS
0.5000 mg | ORAL_TABLET | Freq: Once | ORAL | Status: AC
  Administered 2024-11-29: 0.5 mg via ORAL
  Filled 2024-11-29: qty 1

## 2024-11-29 MED ORDER — IPRATROPIUM BROMIDE 0.02 % IN SOLN: 0.5000 mg | RESPIRATORY_TRACT | Status: DC | PRN

## 2024-11-29 MED ORDER — ONDANSETRON HCL 4 MG/2ML IJ SOLN
4.0000 mg | Freq: Once | INTRAMUSCULAR | Status: AC
  Administered 2024-11-29: 4 mg via INTRAVENOUS
  Filled 2024-11-29: qty 2

## 2024-11-29 MED ORDER — LEVOTHYROXINE SODIUM 100 MCG PO TABS
200.0000 ug | ORAL_TABLET | Freq: Every day | ORAL | Status: DC
  Administered 2024-11-29 – 2024-12-01 (×3): 200 ug via ORAL
  Filled 2024-11-29 (×3): qty 2

## 2024-11-29 MED ORDER — AZITHROMYCIN 250 MG PO TABS
250.0000 mg | ORAL_TABLET | Freq: Every day | ORAL | Status: DC
  Administered 2024-11-29: 250 mg via ORAL
  Filled 2024-11-29: qty 1

## 2024-11-29 MED ORDER — DEXMEDETOMIDINE HCL IN NACL 400 MCG/100ML IV SOLN
0.0000 ug/kg/h | INTRAVENOUS | Status: DC
  Administered 2024-11-29: 0.4 ug/kg/h via INTRAVENOUS

## 2024-11-29 MED ORDER — ONDANSETRON HCL 4 MG PO TABS: 4.0000 mg | ORAL_TABLET | Freq: Four times a day (QID) | ORAL | Status: DC | PRN

## 2024-11-29 MED ORDER — BUDESONIDE 0.25 MG/2ML IN SUSP
0.2500 mg | Freq: Two times a day (BID) | RESPIRATORY_TRACT | Status: DC
  Administered 2024-11-29 – 2024-12-01 (×3): 0.25 mg via RESPIRATORY_TRACT
  Filled 2024-11-29 (×5): qty 2

## 2024-11-29 MED ORDER — ALBUTEROL SULFATE (2.5 MG/3ML) 0.083% IN NEBU: 2.5000 mg | INHALATION_SOLUTION | RESPIRATORY_TRACT | Status: DC | PRN

## 2024-11-29 MED ORDER — DM-GUAIFENESIN ER 30-600 MG PO TB12
1.0000 | ORAL_TABLET | Freq: Two times a day (BID) | ORAL | Status: DC
  Administered 2024-11-29 – 2024-12-01 (×5): 1 via ORAL
  Filled 2024-11-29 (×8): qty 1

## 2024-11-29 MED ORDER — SODIUM CHLORIDE 0.9 % IV BOLUS
500.0000 mL | Freq: Once | INTRAVENOUS | Status: AC
  Administered 2024-11-29: 500 mL via INTRAVENOUS

## 2024-11-29 MED ORDER — POTASSIUM CHLORIDE 10 MEQ/100ML IV SOLN
10.0000 meq | INTRAVENOUS | Status: AC
  Administered 2024-11-29 (×2): 10 meq via INTRAVENOUS
  Filled 2024-11-29 (×2): qty 100

## 2024-11-29 MED ORDER — ORAL CARE MOUTH RINSE: 15.0000 mL | OROMUCOSAL | Status: DC | PRN

## 2024-11-29 MED ORDER — ACETAMINOPHEN 650 MG RE SUPP: 650.0000 mg | Freq: Four times a day (QID) | RECTAL | Status: DC | PRN

## 2024-11-29 MED ORDER — POTASSIUM CHLORIDE 10 MEQ/100ML IV SOLN
10.0000 meq | INTRAVENOUS | Status: AC
  Administered 2024-11-29 (×4): 10 meq via INTRAVENOUS
  Filled 2024-11-29 (×4): qty 100

## 2024-11-29 MED ORDER — IPRATROPIUM-ALBUTEROL 0.5-2.5 (3) MG/3ML IN SOLN
3.0000 mL | Freq: Four times a day (QID) | RESPIRATORY_TRACT | Status: DC
  Administered 2024-11-29: 3 mL via RESPIRATORY_TRACT
  Filled 2024-11-29: qty 3

## 2024-11-29 MED ORDER — LORAZEPAM 2 MG/ML IJ SOLN
0.5000 mg | Freq: Once | INTRAMUSCULAR | Status: AC
  Administered 2024-11-29: 0.5 mg via INTRAVENOUS
  Filled 2024-11-29: qty 1

## 2024-11-29 MED ORDER — AZITHROMYCIN 250 MG PO TABS: 250.0000 mg | ORAL_TABLET | Freq: Every day | ORAL | Status: DC

## 2024-11-29 MED ORDER — PANTOPRAZOLE SODIUM 40 MG PO TBEC
40.0000 mg | DELAYED_RELEASE_TABLET | Freq: Every day | ORAL | Status: DC
  Administered 2024-11-29 – 2024-12-01 (×3): 40 mg via ORAL
  Filled 2024-11-29 (×3): qty 1

## 2024-11-29 MED ORDER — AMIODARONE IV BOLUS ONLY 150 MG/100ML
150.0000 mg | Freq: Once | INTRAVENOUS | Status: AC
  Administered 2024-11-29: 150 mg via INTRAVENOUS

## 2024-11-29 MED ORDER — IPRATROPIUM BROMIDE 0.02 % IN SOLN
0.5000 mg | Freq: Four times a day (QID) | RESPIRATORY_TRACT | Status: DC
  Administered 2024-11-29 (×3): 0.5 mg via RESPIRATORY_TRACT
  Filled 2024-11-29 (×3): qty 2.5

## 2024-11-29 MED ORDER — ONDANSETRON HCL 4 MG/2ML IJ SOLN: 4.0000 mg | Freq: Four times a day (QID) | INTRAMUSCULAR | Status: DC | PRN

## 2024-11-29 MED ORDER — AZITHROMYCIN 250 MG PO TABS: 500.0000 mg | ORAL_TABLET | Freq: Every day | ORAL | Status: DC

## 2024-11-29 MED ORDER — METHYLPREDNISOLONE SODIUM SUCC 40 MG IJ SOLR
40.0000 mg | Freq: Two times a day (BID) | INTRAMUSCULAR | Status: DC
  Administered 2024-11-29: 40 mg via INTRAVENOUS
  Filled 2024-11-29: qty 1

## 2024-11-29 NOTE — ED Notes (Signed)
Patient placed on zoll pads 

## 2024-11-29 NOTE — ED Notes (Addendum)
 MD Arrien notified about patient in a-fib HR 130-150.

## 2024-11-29 NOTE — Assessment & Plan Note (Addendum)
 Patient will continue metoprolol  for blood pressure control.  Diuretic therapy with hydrochlorothiazide .

## 2024-11-29 NOTE — Assessment & Plan Note (Addendum)
 Echocardiogram with preserved LV systolic function with EF 60 to 65%, grade II diastolic dysfunction with pseudo normalization EA, RV systolic function preserved, LA and RA with moderate dilatation, mild mitral valve regurgitation, mild aortic stenosis, not able to assess PA pressure   Patient was placed on amiodarone  for rate control atrial fibrillation Added low dose metoprolol  with good toleration. Continue furosemide  as needed for volume overload.   Acute hypoxemic respiratory failure, due to cardiogenic pulmonary edema (acute)  Positive for rhinovirus  Patient initially was placed on Bipap for respiratory support, then successfully transitioned to nasal cannula.  Patient was placed on bronchodilator therapy and inhaled corticosteroids during her hospitalization  At the time of her discharge her 02 saturation was 95% on room air.

## 2024-11-29 NOTE — Assessment & Plan Note (Addendum)
 Patient was placed on IV amiodarone  and po metoprolol , she converted to sinus rhythm.  Plan to continue low dose metoprolol  and oral amiodarone . Discontinue diltiazem  and flecainide   Continue anticoagulation with apixaban .

## 2024-11-29 NOTE — Progress Notes (Signed)
 Echocardiogram 2D Echocardiogram has been performed.  Kathryn [name] 11/29/2024, 1:42 PM

## 2024-11-29 NOTE — Assessment & Plan Note (Signed)
Calculated BMI is 33,0  ?

## 2024-11-29 NOTE — H&P (Signed)
 " History and Physical    Patient: Kathryn [name] FMW:[id number] DOB: 1956-08-05 DOA: 11/28/2024 DOS: the patient was seen and examined on 11/29/2024 PCP: Nichole Senior, MD  Patient coming from: Home  Chief Complaint:  Chief Complaint  Patient presents with   Respiratory Distress   HPI: Kathryn [name] is a 69 y.o. female with medical history significant of hypertension, hyperlipidemia, chronic diastolic heart failure, paroxysmal atrial fibrillation who presents to the emergency department via EMS in respiratory distress.  At bedside, patient was unable to provide history due to increased work of breathing, most of the history was obtained from ED PA and husband at bedside.  Per report, patient has been having flu symptoms associated with progressive and worsening wheezes, body aches and diarrhea for the past 2 to 3 days.  Shortness of breath worsened yesterday was in respiratory distress, EMS was activated and on arrival of EMS, HR was greater than 200 and she appeared to be in SVT, so adenosine  x 2 (6 mg, and then 12 mg) with some improvement was given.  IV Cardizem  20 mg x 1 was given.  Patient has not been eating since last 3 days due to symptoms and she has not been taking her meds since last 3 days including Eliquis , metoprolol , flecainide .  O2 sat was 76% on room air, this increased to 88% on NRB and she was seen for the ED for further evaluation.  ED course In the emergency department, she was tachypneic, tachycardic.  O2 sat was 97 to 100% on HFNC at 15 LPM.  Workup in the ED showed normocytic anemia, K+ 2.4, chloride 94, blood glucose 158, proBNP 2,408, troponin 16 > 19. CT angiography of chest showed no pulmonary embolism. EKG personally reviewed showed atrial fibrillation with RVR Breathing treatment was provided, patient was started on IV Cardizem  drip.  She was empirically treated with IV ceftriaxone  and azithromycin  due to presumed CAP.  BP dropped to hypotensive range on Cardizem , so  cardiology was consulted and IV amiodarone  was started.  Potassium was replenished.     Review of Systems: As mentioned in the history of present illness. All other systems reviewed and are negative. Past Medical History:  Diagnosis Date   Anemia    Anxiety    Arthritis    Breast cancer (HCC)    Right breast, had lumpectomy only no chemo, radiation   Cancer (HCC) 10/2016   Breast right,  lumpectomy,  did not require chemo/xrt   DDD (degenerative disc disease), cervical    Depression    Dyspnea    Dysrhythmia    A Fib   Elevated cholesterol    GERD (gastroesophageal reflux disease)    H/O hiatal hernia    Headache    Hypertension    Hypothyroidism    Neuromuscular disorder (HCC)    neuropathy BLE and Feet numbness   Paroxysmal atrial fibrillation (HCC) 11/20/2020   PONV (postoperative nausea and vomiting)    Tremor    in hands and down left leg   Wears partial dentures    upper   Past Surgical History:  Procedure Laterality Date   ABDOMINAL HYSTERECTOMY  yrs ago   ANTERIOR LAT LUMBAR FUSION N/A 11/21/2019   Procedure: Lumbar two-three Lumbar three-four Anterolateral lumbar interbody fusion with lateral plate;  Surgeon: Colon Shove, MD;  Location: [facility];  Service: Neurosurgery;  Laterality: N/A;   BIOPSY  11/23/2023   Procedure: BIOPSY;  Surgeon: Saintclair Jasper, MD;  Location: [facility];  Service:  Gastroenterology;;   BOTOX  INJECTION N/A 11/23/2023   Procedure: BOTOX  INJECTION;  Surgeon: Saintclair Jasper, MD;  Location: [facility];  Service: Gastroenterology;  Laterality: N/A;   BREAST EXCISIONAL BIOPSY Right 11/2016   ALH and papillomas   BREAST LUMPECTOMY Right 2017   BREAST LUMPECTOMY WITH RADIOACTIVE SEED LOCALIZATION Right 12/15/2016   Procedure: RADIOACTIVE SEED X 2 GUIDED EXCISIONAL BREAST BIOPSY;  Surgeon: Donnice Bury, MD;  Location: [facility];  Service: General;  Laterality: Right;   CARPAL TUNNEL RELEASE Bilateral yrs ago   x 2   cervical  neck fusion  yrs ago   C 3 and C 4    COLONOSCOPY     ESOPHAGOGASTRODUODENOSCOPY (EGD) WITH PROPOFOL  N/A 11/23/2023   Procedure: ESOPHAGOGASTRODUODENOSCOPY (EGD) WITH PROPOFOL ;  Surgeon: Saintclair Jasper, MD;  Location: [facility];  Service: Gastroenterology;  Laterality: N/A;   ESOPHAGOGASTRODUODENOSCOPY ENDOSCOPY N/A 01/09/2022   Procedure: INTRAOPERATIVE ENDOSCOPY;  Surgeon: Rubin Calamity, MD;  Location: [facility];  Service: General;  Laterality: N/A;   EYE SURGERY Bilateral    Cataracts removed   left knee arthroscopy   last done 2008   x 3   left shoulder arthroscopy  yrs ago   lower back surgery  2005   right total knee replacement   2008   TOTAL HIP ARTHROPLASTY Left 04/08/2023   Procedure: TOTAL HIP ARTHROPLASTY ANTERIOR APPROACH;  Surgeon: Fidel Rogue, MD;  Location: [facility];  Service: Orthopedics;  Laterality: Left;  150   TOTAL HIP ARTHROPLASTY Right 06/21/2024   Procedure: ARTHROPLASTY, HIP, TOTAL, ANTERIOR APPROACH;  Surgeon: Fidel Rogue, MD;  Location: [facility];  Service: Orthopedics;  Laterality: Right;   TOTAL KNEE ARTHROPLASTY  01/14/2012   Procedure: TOTAL KNEE ARTHROPLASTY;  Surgeon: Reyes JAYSON Billing, MD;  Location: [facility];  Service: Orthopedics;  Laterality: Left;  preop femoral nerve block   UPPER GI ENDOSCOPY     WISDOM TOOTH EXTRACTION     Social History:  reports that she has never smoked. She has never used smokeless tobacco. She reports that she does not currently use alcohol . She reports that she does not use drugs.  Allergies[1]  Family History  Problem Relation Age of Onset   Heart disease Father    Stroke Brother    Breast cancer Neg Hx     Prior to Admission medications  Medication Sig Start Date End Date Taking? Authorizing Provider  acetaminophen  (TYLENOL ) 500 MG tablet Take 500-1,000 mg by mouth every 6 (six) hours as needed (pain.).    [provider]  buPROPion (WELLBUTRIN XL) 150 MG 24 hr tablet Take 150 mg by mouth every morning.  09/20/24   [provider]  Camphor-Menthol -Methyl Sal (SALONPAS) 3.11-28-08 % PTCH Place 1 patch onto the skin daily as needed (left hip pain).    [provider]  chlorhexidine  (HIBICLENS ) 4 % external liquid Apply 15 mLs (1 Application total) topically as directed for 30 doses. Use as directed daily for 5 days every other week for 6 weeks. 06/21/24   Leigh Valery RAMAN, PA-C  Cholecalciferol (D3 PO) Take 1 tablet by mouth daily.    [provider]  colchicine  0.6 MG tablet Take 0.6 mg by mouth as needed (gout flare). 09/04/21   [provider]  Cyanocobalamin  (VITAMIN B-12 PO) Take 1 tablet by mouth in the morning.    [provider]  diltiazem  (CARDIZEM  CD) 120 MG 24 hr capsule Take 120 mg by mouth. 08/18/24   [provider]  diltiazem  (CARDIZEM )  30 MG tablet Take 1 tablet every 4 hours AS NEEDED for heart rate >100 as long as blood pressure >100. 10/06/23   Terra Fairy PARAS, PA-C  DULoxetine  (CYMBALTA ) 30 MG capsule Take 30 mg by mouth every evening. 30 mg + 60 mg=90 mg 09/08/22   [provider]  DULoxetine  (CYMBALTA ) 60 MG capsule Take 60 mg by mouth every evening. 60 mg + 30 mg=90 mg    [provider]  ELIQUIS  5 MG TABS tablet Take 5 mg by mouth 2 (two) times daily. 09/27/24   [provider]  flecainide  (TAMBOCOR ) 50 MG tablet Take 1 tablet (50 mg total) by mouth 2 (two) times daily. 10/23/24   Cindie Ole DASEN, MD  furosemide  (LASIX ) 20 MG tablet TAKE 1 TABLET BY MOUTH DAILY AS NEEDED 02/17/23   Cindie Ole DASEN, MD  gabapentin  (NEURONTIN ) 600 MG tablet Take 600 mg by mouth 3 (three) times daily. 08/03/22   [provider]  hydrochlorothiazide  (HYDRODIURIL ) 25 MG tablet Take 1 tablet by mouth once daily 06/01/24   Cindie Ole DASEN, MD  levothyroxine  (SYNTHROID ) 200 MCG tablet Take 200 mcg by mouth daily before breakfast.    [provider]  methocarbamol  (ROBAXIN ) 500 MG tablet Take 500 mg by mouth  4 (four) times daily.    [provider]  metoprolol  succinate (TOPROL -XL) 100 MG 24 hr tablet TAKE 1 TABLET BY MOUTH DAILY WITH OR IMMEDIATLY FOLLOWING A MEAL 03/03/24   Cindie Ole DASEN, MD  MOVANTIK 25 MG TABS tablet Take 25 mg by mouth every morning. 10/12/24   [provider]  omeprazole  (PRILOSEC ) 40 MG capsule Take 40 mg by mouth 2 (two) times daily. 10/13/24   [provider]  oxyCODONE  HCl 7.5 MG TABS Take 7.5 mg by mouth every 4 (four) hours as needed (severe postoperative pain). 06/22/24   Hill, Valery RAMAN, PA-C  oxyCODONE -acetaminophen  (PERCOCET) 7.5-325 MG tablet Take 1 tablet by mouth every 8 (eight) hours as needed.    [provider]  pantoprazole  (PROTONIX ) 40 MG tablet Take 40 mg by mouth 2 (two) times daily. Patient not taking: Reported on 10/23/2024 02/09/24   [provider]  rivaroxaban  (XARELTO ) 20 MG TABS tablet Take 1 tablet (20 mg total) by mouth daily with supper. Patient not taking: Reported on 10/23/2024 09/26/24   Cindie Ole DASEN, MD    Physical Exam: Vitals:   11/28/24 2310 11/28/24 2330 11/28/24 2335 11/28/24 2345  BP:  92/79 114/72 133/82  Pulse:  74 78 (!) 155  Resp:  (!) 23 16 (!) 22  Temp: 98.3 F (36.8 C)     TempSrc: Rectal     SpO2:  100% 100% 96%  Weight:      Height:       General: Elderly female.  Ill appearing, awake and alert and oriented x3.  HEENT: NCAT.  PERRLA. EOMI. Sclerae anicteric.  Moist mucosal membranes. Neck: Neck supple without lymphadenopathy. No carotid bruits. No masses palpated.  Cardiovascular: Tachycardia.  Regular rate with normal S1-S2 sounds. No murmurs, rubs or gallops auscultated. No JVD.  Respiratory: Tachypnea.  Coarse breath sounds with expiratory wheezes.  No accessory muscle use. Abdomen: Soft, nontender, nondistended. Active bowel sounds. No masses or hepatosplenomegaly  Skin: No rashes, lesions, or ulcerations.  Dry, warm to touch. Musculoskeletal:  2+ dorsalis pedis and  radial pulses. Good ROM.  No contractures  Psychiatric: Intact judgment and insight.  Mood appropriate to current condition. Neurologic: No focal neurological deficits. Strength is  5/5 x 4.  CN II - XII grossly intact.  Assessment and Plan: Atrial fibrillation with RVR CHA2DS2-VASc score = 3 Continue telemetry Patient was started on IV amiodarone  drip Continue Eliquis  per home regimen Plan for TTE/cardioversion if converts back to AF by Cardiology Cardiology consult appreciated  Hypokalemia K+ 2.4, this is possibly due to electrolyte loss due to daily diarrhea.  This was replenished.   Goal K > 4, Mg > 2.  Magnesium  pending  Acute diarrhea Patient has not had bowel movement since arrival to the ED Consider C.Diff and GI stool panel if she continues to have diarrhea  Acute respiratory failure with hypoxia possibly due to bronchitis Patient with expiratory wheezes, coarse breath sounds and tachypnea on exam She has been having flu symptoms, but respiratory panel was negative Continue Atrovent , Mucinex , Solu-Medrol , azithromycin . Continue Protonix  to prevent steroid-induced ulcer Continue incentive spirometry and flutter valve Continue supplemental oxygen to maintain O2 sat > 92% with plan to wean patient off oxygen as tolerated  Elevated proBNP proBNP 2,408 Continue total input/output, daily weights and fluid restriction Continue heart healthy diet  Echocardiogram done in February 2024 showed EF of 60 to 65%.  No RWMA.  G1 DD.   Cardiology does not think this is due to fluid overload Echocardiogram in the morning   Acquired hypothyroidism Continue Synthroid    Advance Care Planning: Full code  Consults: Cardiology  Family Communication: Husband at bedside  Severity of Illness: The appropriate patient status for this patient is INPATIENT. Inpatient status is judged to be reasonable and necessary in order to provide the required intensity of service to ensure the patient's  safety. The patient's presenting symptoms, physical exam findings, and initial radiographic and laboratory data in the context of their chronic comorbidities is felt to place them at high risk for further clinical deterioration. Furthermore, it is not anticipated that the patient will be medically stable for discharge from the hospital within 2 midnights of admission.   * I certify that at the point of admission it is my clinical judgment that the patient will require inpatient hospital care spanning beyond 2 midnights from the point of admission due to high intensity of service, high risk for further deterioration and high frequency of surveillance required.*  Author: Dannica Bickham, DO 11/29/2024 12:17 AM  For on call review [email or web address].      [1] No Known Allergies  "

## 2024-11-29 NOTE — Progress Notes (Signed)
 Patient on 8L [place] no respiratory distress noted.  BiPAP not indicated at this time.

## 2024-11-29 NOTE — Progress Notes (Signed)
 Attempted to place patient on Bipap per MD request. Patient immediately began begging to take it off. I stayed at bedside with her for 20 minutes trying to get her to relax & try to tolerate the Bipap. Finally placed back on [place] [facility] [place] at 12L.

## 2024-11-29 NOTE — Progress Notes (Addendum)
 " Progress Note   Patient: Kathryn [name] FMW:[id number] DOB: 04/26/1956 DOA: 11/28/2024     0 DOS: the patient was seen and examined on 11/29/2024   Brief hospital course: Kathryn [name] was admitted to the hospital with the working diagnosis of atrial fibrillation.   69 yo female with the past medical history of hypertension, hyperlipidemia, heart failure, atrial fibrillation and obesity who presented with dyspnea.  Patient reported flu like symptoms for the last 2 to 3 days, with wheezing, diarrhea and body aches, that progressed to severe dyspnea. EMS was called and she was found in respiratory distress, cyanotic with 02 saturation 76% on room air, with heart rate of 280 bpm, she was placed on non re breather mask, and received adenosine  plus diltiazem , then transported to the ED.  On her initial physical examination her blood pressure was 92/79, HT 155, RR 23 and 02 saturation 96% Lungs with coarse breath sounds bilaterally and expiratory wheezing, heart with S1 and S2 present and tachycardic, irregularly irregular with no gallops, rubs or murmurs, abdomen soft and non tender, positive lower extremity edema ++  VBG 7,50/ 35.8/ 59/ 28/ 92%  Na 135, K 2.4 Cl 91 bicarbonate 24 glucose 158 bun 20 cr 0,91  AST 25 ALT 12 Pro BNP 2,408 High sensitive troponin 16 and 19  Wbc 8,3 hgb 16.7 plt 250   Influenza negative RSV negative Covid 19 negative   Chest radiograph with left rotation, positive cardiomegaly, bilateral hilar vascular congestion, with no infiltrates or effusions.   EKG 137 bpm, normal axis, normal intervals, qtc 399, atrial fibrillation with no significant ST segment or T wave changes.   Patient was placed on IV furosemide  for diuresis and IV amiodarone  for rate control atrial fibrillation.    Assessment and Plan: * Paroxysmal atrial fibrillation with RVR (HCC) Continue rate and rhythm control with IV amiodarone  Continue anticoagulation with with apixaban    Acute on chronic  diastolic CHF (congestive heart failure) (HCC) 2024 echocardiogram with preserved LV systolic function.   Follow up on new echocardiogram Continue rate control atrial fibrillation.  Holding on diuretic therapy for now   Acute hypoxemic respiratory failure  Continue supportive therapy with 02 per [place] Patient now off bipap.  Check viral respiratory panel  Hold on systemic corticosteroids Continue with bronchodilator therapy   Essential hypertension Continue blood pressure monitoring   Esophageal achalasia Monitor po intake, and request swallow evaluation   Hyponatremia Renal function with serum cr at 0,80 with K at 3,3 and serum bicarbonate at 25 Na 133  Plan to correct K with KCl 40 meq x2 and follow up renal function and electrolytes in am Holding diuretic therapy for now   Lumbar radiculopathy, chronic PT and OT Positive hardware in the spine  Hypothyroidism Continue levothyroxine    Obesity, class 1 Calculated BMI is 33,0         Subjective: Patient with improvement in dyspnea, but not back to baseline, no chest pain, this am is off bipap   Physical Exam: Vitals:   11/29/24 1400 11/29/24 1430 11/29/24 1445 11/29/24 1521  BP: 108/67 108/65 121/74 97/67  Pulse: 71 69 77 78  Resp: 17 16 16 15   Temp:    98.5 F (36.9 C)  TempSrc:    Oral  SpO2: 100% 100% 100% 99%  Weight:    87.3 kg  Height:    5' 4 (1.626 m)   Neurology awake and alert, deconditioned and ill looking appearing ENT with mild pallor with no  icterus Cardiovascular with S1 and S2 present, irregularly irregular, tachycardic no gallops, rubs or murmurs Respiratory with positive rales bilaterally with no wheezing or rhonchi  Abdomen protuberant, soft and non tender Positive lower extremity edema ++   Data Reviewed:    Family Communication: I spoke with patient's husband at the bedside, we talked in detail about patient's condition, plan of care and prognosis and all questions were  addressed.   Disposition: Status is: Inpatient Remains inpatient appropriate because: atrial fibrillation   Planned Discharge Destination: Home     Author: Elidia Toribio Furnace, MD 11/29/2024 3:45 PM  For on call review [email or web address].  "

## 2024-11-29 NOTE — Hospital Course (Addendum)
 Mrs. Sciara was admitted to the hospital with the working diagnosis of atrial fibrillation.   69 yo female with the past medical history of hypertension, hyperlipidemia, heart failure, atrial fibrillation and obesity who presented with dyspnea.  Patient reported flu like symptoms for the last 2 to 3 days, with wheezing, diarrhea and body aches, that progressed to severe dyspnea. EMS was called and she was found in respiratory distress, cyanotic with 02 saturation 76% on room air, with heart rate of 280 bpm, she was placed on non re breather mask, and received adenosine  plus diltiazem , then transported to the ED.  On her initial physical examination her blood pressure was 92/79, HR 155, RR 23 and 02 saturation 96% Lungs with coarse breath sounds bilaterally and expiratory wheezing, heart with S1 and S2 present and tachycardic, irregularly irregular with no gallops, rubs or murmurs, abdomen soft and non tender, positive lower extremity edema ++  VBG 7,50/ 35.8/ 59/ 28/ 92%  Na 135, K 2.4 Cl 91 bicarbonate 24 glucose 158 bun 20 cr 0,91  AST 25 ALT 12 Pro BNP 2,408 High sensitive troponin 16 and 19  Wbc 8,3 hgb 16.7 plt 250   Influenza negative RSV negative Covid 19 negative  Rhinovirus positive   Chest radiograph with left rotation, positive cardiomegaly, bilateral hilar vascular congestion, with no infiltrates or effusions.   CT chest with no pulmonary embolism, minimal atelectatic changes in the bilateral lower lobes.   EKG 137 bpm, normal axis, normal intervals, qtc 399, atrial fibrillation with no significant ST segment or T wave changes.   Patient was placed on IV furosemide  for diuresis and IV amiodarone  for rate control atrial fibrillation.  01/08 added metoprolol , patient converted to sinus rhythm.  01/09 patient clinically improved, plan to continue oral amiodarone  and follow up as outpatient.

## 2024-11-29 NOTE — Progress Notes (Addendum)
 "  Progress Note  Patient Name: Kathryn [name] Date of Encounter: 11/29/2024 [facility] Cardiologist: Candyce Reek, MD   Interval Summary   Admitted overnight for A-fib RVR.  No chest pain or shortness of breath.  Requesting to be removed from bedpan, nurse notified.  Vital Signs Vitals:   11/29/24 0720 11/29/24 0721 11/29/24 0745 11/29/24 0845  BP: 120/77  110/83   Pulse: 80  (!) 52 (!) 56  Resp: 20  18 17   Temp: 97.8 F (36.6 C)     TempSrc: Oral     SpO2: 100% 100% 100% 100%  Weight:      Height:        Intake/Output Summary (Last 24 hours) at 11/29/2024 0915 Last data filed at 11/29/2024 9391 Gross per 24 hour  Intake 1297.26 ml  Output --  Net 1297.26 ml      11/28/2024   11:00 PM 10/23/2024   11:24 AM 06/21/2024    8:12 AM  Last 3 Weights  Weight (lbs) 195 lb 1.7 oz 195 lb 1.6 oz 197 lb  Weight (kg) 88.5 kg 88.497 kg 89.359 kg      Telemetry/ECG  A-fib.  Heart rates fluctuating anywhere between 100-140- Personally Reviewed  Physical Exam  GEN: No acute distress.  Looks fatigued.  On supplemental oxygen. Neck: No JVD Cardiac: IRRR, 2 out of 6 murmur RSB  Respiratory: Clear to auscultation bilaterally.  Anteriorly. GI: Soft, nontender, non-distended  MS: Trace edema  Patient Profile Patient with past medical history significant for PAF, anemia, hypothyroidism.  Patient admitted for A-fib RVR in the setting of recent viral symptoms with negative viral panel here.  Assessment & Plan   PAF -07/2024 A-fib RVR admission at outside hospital, spontaneously converted to sinus rhythm. A-fib RVR in the setting of severe viral symptoms and a potassium of 2.4 on arrival secondary to diarrhea.  Heart rates fluctuating between 100-140.  Thinks that she has been in atrial fibrillation for the past week.  Reports she is aware when she is in it.  Volume status looks okay.  She reports she did miss her cardiac meds x 2 days PTA secondary to illness. Continue with  Eliquis  5 mg twice daily, IV amiodarone . Flecainide  being held. She may spontaneously convert once electrolytes are corrected and with treatment of her viral symptoms.  If not may need to pursue TEE/DCCV. Followed by EP with considerations of ablation and watchman given prior history of anemia although no GI bleeding.  Anemia seems resolved. 3 months ago TSH abnormal but T4 was normal. Continue to aggressively treat her potassium and other electrolyte abnormalities. Echocardiogram pending.  Murmur Echocardiogram pending possible aortic stenosis.  Nonobstructive CAD -01/2023 CCTA CAC score 1.22. No anginal complaints.  Viral symptoms Respiratory panel negative.  Severe diarrhea with hypokalemia.  Further management per primary team.   For questions or updates, please contact [facility] Please consult [email or web address] for contact info under       Signed, Thom LITTIE Sluder, PA-C     Patient seen and examined, note reviewed with the signed Advanced Practice Provider. I personally reviewed laboratory data, imaging studies and relevant notes. I independently examined the patient and formulated the important aspects of the plan. I have personally discussed the plan with the patient and/or family. Comments or changes to the note/plan are indicated below.  Paroxysmal atrial fibrillation Nonobstructive CAD Viral infection   Concerned that her recent respiratory illness is driving her A-fib RVR she also was noted  to have some diarrhea which led to electrolyte abnormalities.  Heart rate is fluctuating between the 130s to 150s.  By the bedside Amio bolus was given.  She will continue IV amiodarone  drip.  Agree with holding her flecainide  for now we will repeat her echocardiogram continue Eliquis .  One of the biggest issue I am concerned about is the recent viral infection we will discuss with the primary team may benefit from broad-spectrum antibiotics. I am hoping once her clinical picture  can improve in terms of her respiratory status, suspected infection her heart rate will also improve and hopefully spontaneously convert to sinus rhythm.  If not later this week we can consider TEE cardioversion.  Please replete electrolytes. Even transient hypotension she has been challenged with the fluid bolus.  Will monitor closely for need for diuretics as well.   Wanette Robison DO, MS [facility] Attending Cardiologist [facility]  [address] [place], [place] [address] [phone] Website: [email or web address]  "

## 2024-11-29 NOTE — Progress Notes (Signed)
 Echo attempted, RN states to attempt later  [facility]

## 2024-11-29 NOTE — Assessment & Plan Note (Addendum)
 Positive hardware in the spine Continue gabapentin , methocarbamol , and duloxetine  Continue bupropion and oxycodone 

## 2024-11-29 NOTE — Assessment & Plan Note (Signed)
"  Continue levothyroxine          "

## 2024-11-29 NOTE — Assessment & Plan Note (Addendum)
 Monitor po intake, patient tolerating po well.  Follow up as outpatient

## 2024-11-29 NOTE — Assessment & Plan Note (Addendum)
 Hypokalemia.   At the time of discharge her renal function is stable with serum cr at 0,76 with K at 3,5 and serum bicarbonate at 24 Na 134 and Mg 2.2   She will receive 40 meq Kcl prior to discharge and plan to follow up renal function as outpatient.

## 2024-11-30 ENCOUNTER — Other Ambulatory Visit: Payer: Self-pay

## 2024-11-30 DIAGNOSIS — E66811 Obesity, class 1: Secondary | ICD-10-CM | POA: Diagnosis not present

## 2024-11-30 DIAGNOSIS — I5033 Acute on chronic diastolic (congestive) heart failure: Secondary | ICD-10-CM

## 2024-11-30 DIAGNOSIS — E871 Hypo-osmolality and hyponatremia: Secondary | ICD-10-CM

## 2024-11-30 DIAGNOSIS — I10 Essential (primary) hypertension: Secondary | ICD-10-CM | POA: Diagnosis not present

## 2024-11-30 DIAGNOSIS — K220 Achalasia of cardia: Secondary | ICD-10-CM

## 2024-11-30 DIAGNOSIS — I480 Paroxysmal atrial fibrillation: Secondary | ICD-10-CM | POA: Diagnosis not present

## 2024-11-30 DIAGNOSIS — E039 Hypothyroidism, unspecified: Secondary | ICD-10-CM

## 2024-11-30 DIAGNOSIS — M5416 Radiculopathy, lumbar region: Secondary | ICD-10-CM

## 2024-11-30 LAB — BASIC METABOLIC PANEL WITH GFR
Anion gap: 14 (ref 5–15)
BUN: 18 mg/dL (ref 8–23)
CO2: 23 mmol/L (ref 22–32)
Calcium: 9.3 mg/dL (ref 8.9–10.3)
Chloride: 94 mmol/L — ABNORMAL LOW (ref 98–111)
Creatinine, Ser: 0.74 mg/dL (ref 0.44–1.00)
GFR, Estimated: >60 mL/min
Glucose, Bld: 103 mg/dL — ABNORMAL HIGH (ref 70–99)
Potassium: 3.4 mmol/L — ABNORMAL LOW (ref 3.5–5.1)
Sodium: 131 mmol/L — ABNORMAL LOW (ref 135–145)

## 2024-11-30 LAB — MAGNESIUM: Magnesium: 2.6 mg/dL — ABNORMAL HIGH (ref 1.7–2.4)

## 2024-11-30 MED ORDER — METOPROLOL TARTRATE 5 MG/5ML IV SOLN
5.0000 mg | INTRAVENOUS | Status: DC | PRN
  Administered 2024-11-30: 5 mg via INTRAVENOUS
  Filled 2024-11-30: qty 5

## 2024-11-30 MED ORDER — POTASSIUM CHLORIDE CRYS ER 20 MEQ PO TBCR
40.0000 meq | EXTENDED_RELEASE_TABLET | ORAL | Status: AC
  Administered 2024-11-30 (×2): 40 meq via ORAL
  Filled 2024-11-30 (×2): qty 2

## 2024-11-30 MED ORDER — ROPINIROLE HCL 0.5 MG PO TABS
0.5000 mg | ORAL_TABLET | Freq: Once | ORAL | Status: AC
  Administered 2024-11-30: 0.5 mg via ORAL
  Filled 2024-11-30: qty 1

## 2024-11-30 MED ORDER — SALINE SPRAY 0.65 % NA SOLN: 1.0000 | NASAL | Status: DC | PRN

## 2024-11-30 MED ORDER — METOPROLOL SUCCINATE ER 25 MG PO TB24
25.0000 mg | ORAL_TABLET | Freq: Every day | ORAL | Status: DC
  Administered 2024-11-30 – 2024-12-01 (×2): 25 mg via ORAL
  Filled 2024-11-30 (×2): qty 1

## 2024-11-30 MED ORDER — IPRATROPIUM-ALBUTEROL 0.5-2.5 (3) MG/3ML IN SOLN
3.0000 mL | Freq: Three times a day (TID) | RESPIRATORY_TRACT | Status: DC
  Filled 2024-11-30: qty 3

## 2024-11-30 NOTE — Progress Notes (Signed)
 "  Progress Note  Patient Name: Kathryn [name] Date of Encounter: 11/30/2024  Primary Cardiologist: Xena Propst, DO   Subjective   Patient seen and examined at her bedside. She was sitting in bed her husband by the bedside.   Inpatient Medications    Scheduled Meds:  apixaban   5 mg Oral BID   budesonide  (PULMICORT ) nebulizer solution  0.25 mg Nebulization BID   dextromethorphan -guaiFENesin   1 tablet Oral BID   ipratropium-albuterol   3 mL Nebulization TID   levothyroxine   200 mcg Oral QAC breakfast   pantoprazole   40 mg Oral Daily   potassium chloride   40 mEq Oral Q4H   Continuous Infusions:  amiodarone  30 mg/hr (11/30/24 0725)   PRN Meds: acetaminophen  **OR** acetaminophen , albuterol , metoprolol  tartrate, ondansetron  **OR** ondansetron  (ZOFRAN ) IV, mouth rinse, sodium chloride    Vital Signs    Vitals:   11/29/24 1922 11/29/24 1954 11/30/24 0022 11/30/24 0820  BP:  118/67 (!) 141/64 136/69  Pulse:  82 62 78  Resp:    20  Temp:   98 F (36.7 C) 97.6 F (36.4 C)  TempSrc:   Oral Oral  SpO2: 100% 100% 99% 97%  Weight:      Height:        Intake/Output Summary (Last 24 hours) at 11/30/2024 1141 Last data filed at 11/30/2024 0849 Gross per 24 hour  Intake 1124.04 ml  Output --  Net 1124.04 ml   Filed Weights   11/28/24 2300 11/29/24 1521  Weight: 88.5 kg 87.3 kg    Telemetry     Still will rvr - Personally Reviewed  ECG     - Personally Reviewed  Physical Exam    General: Comfortable Head: Atraumatic, normal size  Eyes: PEERLA, EOMI  Neck: Supple, normal JVD Cardiac: Normal S1, S2; RRR; no murmurs, rubs, or gallops Lungs: Clear to auscultation bilaterally Abd: Soft, nontender, no hepatomegaly  Ext: warm, no edema   Labs    Chemistry Recent Labs  Lab 11/28/24 1809 11/28/24 1812 11/29/24 0445 11/30/24 0552  NA 135 134* 133* 131*  K 2.4* 2.4* 3.3* 3.4*  CL 91* 94* 93* 94*  CO2 24  --  25 23  GLUCOSE 158* 162* 167* 103*  BUN 20 21 22 18    CREATININE 0.91 1.00 0.80 0.74  CALCIUM 9.1  --  8.9 9.3  PROT 7.0  --  6.7  --   ALBUMIN  3.8  --  3.8  --   AST 25  --  20  --   ALT 12  --  8  --   ALKPHOS 99  --  87  --   BILITOT 0.8  --  0.4  --   GFRNONAA >60  --  >60 >60  ANIONGAP 20*  --  15 14     Hematology Recent Labs  Lab 11/28/24 1646 11/28/24 1753 11/28/24 1812 11/29/24 0445  WBC 8.3  --   --  6.7  RBC 5.45*  --   --  4.68  HGB 16.7* 16.0* 16.3* 14.2  HCT 48.0* 47.0* 48.0* 41.5  MCV 88.1  --   --  88.7  MCH 30.6  --   --  30.3  MCHC 34.8  --   --  34.2  RDW 14.6  --   --  14.8  PLT 250  --   --  239    Cardiac EnzymesNo results for input(s): TROPONINI in the last 168 hours. No results for input(s): TROPIPOC in the last 168  hours.   BNP Recent Labs  Lab 11/28/24 1811  PROBNP 2,408.0*     DDimer No results for input(s): DDIMER in the last 168 hours.   Radiology    ECHOCARDIOGRAM COMPLETE Result Date: 11/29/2024    ECHOCARDIOGRAM REPORT   Patient Name:   Kathryn [name] [facility] Date of Exam: 11/29/2024 Medical Rec #:  [id number]        Height:       64.0 in Accession #:    [id number]       Weight:       195.1 lb Date of Birth:  [birth year]         BSA:          1.936 m Patient Age:    68 years         BP:           139/104 mmHg Patient Gender: F                HR:           107 bpm. Exam Location:  Inpatient Procedure: 2D Echo, Cardiac Doppler, Color Doppler and Intracardiac            Opacification Agent (Both Spectral and Color Flow Doppler were            utilized during procedure). Indications:    CHF- acute diastolic 150.31  History:        Patient has prior history of Echocardiogram examinations, most                 recent 01/12/2023. Arrythmias:Atrial Fibrillation; Risk                 Factors:Hypertension and Dyslipidemia.  Sonographer:    Merlynn Argyle Referring Phys: [id number] OLADAPO ADEFESO  Sonographer Comments: Image acquisition challenging due to patient body habitus and Image acquisition challenging due to  respiratory motion. IMPRESSIONS  1. Left ventricular ejection fraction, by estimation, is 60 to 65%. The left ventricle has normal function. The left ventricle has no regional wall motion abnormalities. Left ventricular diastolic parameters are consistent with Grade II diastolic dysfunction (pseudonormalization).  2. Right ventricular systolic function is normal. The right ventricular size is normal. Tricuspid regurgitation signal is inadequate for assessing PA pressure.  3. Left atrial size was moderately dilated.  4. Right atrial size was moderately dilated.  5. The mitral valve is normal in structure. Mild mitral valve regurgitation. No evidence of mitral stenosis.  6. The aortic valve is tricuspid. There is moderate calcification of the aortic valve. Aortic valve regurgitation is not visualized. Mild aortic valve stenosis. Aortic valve area, by VTI measures 1.79 cm. Aortic valve mean gradient measures 15.0 mmHg. Aortic valve Vmax measures 2.50 m/s.  7. The inferior vena cava is dilated in size with >50% respiratory variability, suggesting right atrial pressure of 8 mmHg. FINDINGS  Left Ventricle: Left ventricular ejection fraction, by estimation, is 60 to 65%. The left ventricle has normal function. The left ventricle has no regional wall motion abnormalities. Definity  contrast agent was given IV to delineate the left ventricular  endocardial borders. The left ventricular internal cavity size was normal in size. There is no left ventricular hypertrophy. Left ventricular diastolic parameters are consistent with Grade II diastolic dysfunction (pseudonormalization). Right Ventricle: The right ventricular size is normal. No increase in right ventricular wall thickness. Right ventricular systolic function is normal. Tricuspid regurgitation signal is inadequate for assessing PA pressure. Left Atrium: Left atrial size was moderately dilated.  Right Atrium: Right atrial size was moderately dilated. Pericardium: There is  no evidence of pericardial effusion. Mitral Valve: The mitral valve is normal in structure. Mild mitral annular calcification. Mild mitral valve regurgitation. No evidence of mitral valve stenosis. Tricuspid Valve: The tricuspid valve is normal in structure. Tricuspid valve regurgitation is trivial. No evidence of tricuspid stenosis. Aortic Valve: The aortic valve is tricuspid. There is moderate calcification of the aortic valve. Aortic valve regurgitation is not visualized. Mild aortic stenosis is present. Aortic valve mean gradient measures 15.0 mmHg. Aortic valve peak gradient measures 25.0 mmHg. Aortic valve area, by VTI measures 1.79 cm. Pulmonic Valve: The pulmonic valve was not well visualized. Pulmonic valve regurgitation is trivial. No evidence of pulmonic stenosis. Aorta: The aortic root is normal in size and structure. Venous: The inferior vena cava is dilated in size with greater than 50% respiratory variability, suggesting right atrial pressure of 8 mmHg. IAS/Shunts: No atrial level shunt detected by color flow Doppler.  LEFT VENTRICLE PLAX 2D LVIDd:         5.80 cm   Diastology LVIDs:         3.60 cm   LV e' medial:    6.84 cm/s LV PW:         1.00 cm   LV E/e' medial:  17.8 LV IVS:        0.90 cm   LV e' lateral:   8.86 cm/s LVOT diam:     2.00 cm   LV E/e' lateral: 13.8 LV SV:         98 LV SV Index:   50 LVOT Area:     3.14 cm  RIGHT VENTRICLE          IVC RV Basal diam:  2.90 cm  IVC diam: 2.30 cm TAPSE (M-mode): 2.1 cm LEFT ATRIUM             Index        RIGHT ATRIUM           Index LA diam:        4.20 cm 2.17 cm/m   RA Area:     17.00 cm LA Vol (A2C):   88.9 ml 45.92 ml/m  RA Volume:   40.30 ml  20.82 ml/m LA Vol (A4C):   46.4 ml 23.97 ml/m LA Biplane Vol: 64.3 ml 33.21 ml/m  AORTIC VALVE AV Area (Vmax):    1.77 cm AV Area (Vmean):   1.65 cm AV Area (VTI):     1.79 cm AV Vmax:           250.00 cm/s AV Vmean:          183.500 cm/s AV VTI:            0.546 m AV Peak Grad:      25.0  mmHg AV Mean Grad:      15.0 mmHg LVOT Vmax:         141.00 cm/s LVOT Vmean:        96.600 cm/s LVOT VTI:          0.311 m LVOT/AV VTI ratio: 0.57  AORTA Ao Root diam: 3.40 cm Ao Asc diam:  3.50 cm MITRAL VALVE MV Area (PHT): 3.54 cm     SHUNTS MV Decel Time: 214 msec     Systemic VTI:  0.31 m MV E velocity: 122.00 cm/s  Systemic Diam: 2.00 cm MV A velocity: 117.00 cm/s MV E/A ratio:  1.04 Toribio Fuel MD Electronically  signed by Toribio Fuel MD Signature Date/Time: 11/29/2024/10:20:49 PM    Final    CT Angio Chest PE W and/or Wo Contrast Result Date: 11/28/2024 EXAM: CTA CHEST 11/28/2024 09:40:52 PM TECHNIQUE: CTA of the chest was performed without and with the administration of 75 mL of iohexol  (OMNIPAQUE ) 350 MG/ML injection. Multiplanar reformatted images are provided for review. MIP images are provided for review. Automated exposure control, iterative reconstruction, and/or weight based adjustment of the mA/kV was utilized to reduce the radiation dose to as low as reasonably achievable. COMPARISON: None available. CLINICAL HISTORY: Pulmonary embolism (PE) suspected, low to intermediate prob, neg D-dimer. FINDINGS: PULMONARY ARTERIES: Pulmonary arteries are adequately opacified for evaluation. No acute pulmonary embolus. Main pulmonary artery is normal in caliber. MEDIASTINUM: The heart and pericardium demonstrate no acute abnormality. There are atherosclerotic calcifications throughout the aorta. LYMPH NODES: No mediastinal, hilar or axillary lymphadenopathy. LUNGS AND PLEURA: There are minimal atelectatic changes in the bilateral lower lobes. The lungs are otherwise clear. No focal consolidation or pulmonary edema. No evidence of pleural effusion or pneumothorax. UPPER ABDOMEN: Limited images of the upper abdomen are unremarkable. SOFT TISSUES AND BONES: Retuberoplasty and surgical changes are seen in the lower thoracic and lumbar region with severe degenerative changes, partially imaged. No acute  soft tissue abnormality. IMPRESSION: 1. No pulmonary embolism. 2. Minimal atelectatic changes in the bilateral lower lobes, with aortic atherosclerosis. Electronically signed by: Greig Pique MD 11/28/2024 09:48 PM EST RP Workstation: [id number]   DG Chest Port 1 View Result Date: 11/28/2024 CLINICAL DATA:  Shortness of breath, respiratory distress and hypoxia. EXAM: PORTABLE CHEST 1 VIEW COMPARISON:  11/20/2020 FINDINGS: The heart size and mediastinal contours are within normal limits. There is no evidence of pulmonary edema, consolidation, pneumothorax or pleural fluid. The visualized skeletal structures are unremarkable. IMPRESSION: No active disease. Electronically Signed   By: Marcey Moan M.D.   On: 11/28/2024 17:07    Cardiac Studies   Echo  Patient Profile     69 y.o. female  with PAF now in afib rvr   Assessment & Plan    Atrial fibrillation with rvr  Mild aortic stenosis  Nonobstructive CAD  Viral infection   Still in atrial fibrillation rvr, now on amiodarone  gtt,  will restart her home Toprol  xl but at a lower dose since blood pressure may not be able to tolerate 100 mg daily. We start at 25 mg daily and titrate up.  Her viral infection is driving this as well. I am hoping once her clinical picture can improve in terms of her respiratory status, heart rate will also improve and hopefully spontaneously convert to sinus rhythm.    If not later this week we can consider TEE cardioversion.   Hold flecainide  for now.  Please keep k > 4 and mag > 2   For questions or updates, please contact [facility] Please consult [email or web address] for contact info under Cardiology/STEMI.      Signed, Dove Gresham, DO  11/30/2024, 11:41 AM    "

## 2024-11-30 NOTE — Plan of Care (Signed)
   Problem: Education: Goal: Knowledge of General Education information will improve Description Including pain rating scale, medication(s)/side effects and non-pharmacologic comfort measures Outcome: Progressing

## 2024-11-30 NOTE — Progress Notes (Signed)
 " Progress Note   Patient: Kathryn [name] FMW:[id number] DOB: 01/30/1956 DOA: 11/28/2024     1 DOS: the patient was seen and examined on 11/30/2024   Brief hospital course: Kathryn [name] was admitted to the hospital with the working diagnosis of atrial fibrillation.   69 yo female with the past medical history of hypertension, hyperlipidemia, heart failure, atrial fibrillation and obesity who presented with dyspnea.  Patient reported flu like symptoms for the last 2 to 3 days, with wheezing, diarrhea and body aches, that progressed to severe dyspnea. EMS was called and she was found in respiratory distress, cyanotic with 02 saturation 76% on room air, with heart rate of 280 bpm, she was placed on non re breather mask, and received adenosine  plus diltiazem , then transported to the ED.  On her initial physical examination her blood pressure was 92/79, HT 155, RR 23 and 02 saturation 96% Lungs with coarse breath sounds bilaterally and expiratory wheezing, heart with S1 and S2 present and tachycardic, irregularly irregular with no gallops, rubs or murmurs, abdomen soft and non tender, positive lower extremity edema ++  VBG 7,50/ 35.8/ 59/ 28/ 92%  Na 135, K 2.4 Cl 91 bicarbonate 24 glucose 158 bun 20 cr 0,91  AST 25 ALT 12 Pro BNP 2,408 High sensitive troponin 16 and 19  Wbc 8,3 hgb 16.7 plt 250   Influenza negative RSV negative Covid 19 negative   Chest radiograph with left rotation, positive cardiomegaly, bilateral hilar vascular congestion, with no infiltrates or effusions.   CT chest with no pulmonary embolism, minimal atelectatic changes in the bilateral lower lobes.   EKG 137 bpm, normal axis, normal intervals, qtc 399, atrial fibrillation with no significant ST segment or T wave changes.   Patient was placed on IV furosemide  for diuresis and IV amiodarone  for rate control atrial fibrillation.  01/08 added metoprolol , patient converted to sinus rhythm.   Assessment and Plan: *  Paroxysmal atrial fibrillation with RVR (HCC) Continue rate and rhythm control with IV amiodarone  Adding metoprolol  succinate 25 mg  Continue anticoagulation with with apixaban     Acute on chronic diastolic CHF (congestive heart failure) (HCC) Echocardiogram with preserved LV systolic function with EF 60 to 65%, grade II diastolic dysfunction with pseudo normalization EA, RV systolic function preserved, LA and RA with moderate dilatation, mild mitral valve regurgitation, mild aortic stenosis, not able to assess PA pressure   Plan to continue amiodarone  for rate and rhythm control Adding metoprolol  succinate for B blockade.   Acute hypoxemic respiratory failure  Continue supportive therapy with 02 per [place] Patient now off bipap.  Positive for rhinovirus  Hold on systemic corticosteroids Continue with bronchodilator therapy   Essential hypertension Continue blood pressure monitoring   Esophageal achalasia Monitor po intake, patient tolerating po well.  Follow up as outpatient   Hyponatremia Hypokalemia.   Today renal function with serum cr at 0,74 with K at 3,4 and serum bicarbonate at 23 Na 131 and Mg 2.6   Plan to correct K with KCl 40 meq x2 and follow up renal function and electrolytes in am   Lumbar radiculopathy, chronic PT and OT Positive hardware in the spine  Hypothyroidism Continue levothyroxine    Obesity, class 1 Calculated BMI is 33,0      Subjective: Patient is feeling better, dyspnea is improving, continue very weak and deconditioned   Physical Exam: Vitals:   11/29/24 1954 11/30/24 0022 11/30/24 0820 11/30/24 1207  BP: 118/67 (!) 141/64 136/69 (!) 137/95  Pulse:  82 62 78 (!) 125  Resp:   20 16  Temp:  98 F (36.7 C) 97.6 F (36.4 C) 98.6 F (37 C)  TempSrc:  Oral Oral Oral  SpO2: 100% 99% 97% 95%  Weight:      Height:       Neurology awake and alert, deconditioned ENT with mild pallor  Cardiovascular with S1 and S2 present, irregularly  irregular with no gallops, rubs or murmurs Respiratory with rales bilaterally at bases with no wheezing, no rhonchi, poor inspiratory effort Abdomen with no distention, soft and non tender Trace lower extremity edema   Data Reviewed:    Family Communication: I spoke with patient's husband at the bedside, we talked in detail about patient's condition, plan of care and prognosis and all questions were addressed.   Disposition: Status is: Inpatient Remains inpatient appropriate because: IV amiodarone    Planned Discharge Destination: Home     Author: Elidia Toribio Furnace, MD 11/30/2024 12:47 PM  For on call review [email or web address].  "

## 2024-11-30 NOTE — Progress Notes (Signed)
" °   11/30/24 1207  Assess: MEWS Score  Temp 98.6 F (37 C)  BP (!) 137/95  MAP (mmHg) 109  Pulse Rate (!) 125  ECG Heart Rate (!) 130  Resp 16  SpO2 95 %  O2 Device Room Air  Assess: MEWS Score  MEWS Temp 0  MEWS Systolic 0  MEWS Pulse 3  MEWS RR 0  MEWS LOC 0  MEWS Score 3  MEWS Score Color Yellow  Assess: if the MEWS score is Yellow or Red  Were vital signs accurate and taken at a resting state? Yes  Does the patient meet 2 or more of the SIRS criteria? No  MEWS guidelines implemented  Yes, yellow  Treat  MEWS Interventions Considered administering scheduled or prn medications/treatments as ordered  Take Vital Signs  Increase Vital Sign Frequency  Yellow: Q2hr x1, continue Q4hrs until patient remains green for 12hrs  Escalate  MEWS: Escalate Yellow: Discuss with charge nurse and consider notifying provider and/or RRT  Notify: Charge Nurse/RN  Name of Charge Nurse/RN Notified harlene Makaelyn Aponte rn  Provider Notification  Provider Name/Title Dr Noralee and Dr Sheena  Date Provider Notified 11/30/24  Time Provider Notified 1244  Method of Notification Page (secure chat)  Notification Reason Other (Comment) (mews color change)  Provider response See new orders  Date of Provider Response 11/30/24  Time of Provider Response 1245  Assess: SIRS CRITERIA  SIRS Temperature  0  SIRS Respirations  0  SIRS Pulse 1  SIRS WBC 0  SIRS Score Sum  1    "

## 2024-11-30 NOTE — Plan of Care (Signed)

## 2024-11-30 NOTE — Evaluation (Signed)
 Physical Therapy Evaluation Patient Details Name: Kathryn [name] MRN: [id number] DOB: 31-Jan-1956 Today's Date: 11/30/2024  History of Present Illness  Pt is a 69 y.o. F presenting to [facility]24 for evaluation of A-fib. PMH is significant for Afib, anemia, arthritis, DDD, GERD, HTN, and hypothyroidism.  Clinical Impression  Prior to admittance, pt was independent with mobility and ADLs. Pt presents to evaluation with deficits in mobility, strength, power, activity tolerance, and balance. Pt was able to ambulate 200' without an AD and no physical assistance. Discussed stair negotiation with pt reporting she feels confident both pt's spouse and pt's daughter will be able to assist her if she needs it. PT will continue to treat pt while she is admitted. Anticipate no follow up PT needed at discharge.         If plan is discharge home, recommend the following: Assist for transportation;Help with stairs or ramp for entrance   Can travel by private vehicle        Equipment Recommendations None recommended by PT  Recommendations for Other Services       Functional Status Assessment Patient has had a recent decline in their functional status and demonstrates the ability to make significant improvements in function in a reasonable and predictable amount of time.     Precautions / Restrictions Precautions Precautions: Fall Recall of Precautions/Restrictions: Intact Restrictions Weight Bearing Restrictions Per Provider Order: No      Mobility  Bed Mobility Overal bed mobility: Modified Independent             General bed mobility comments: Pt completed supine <> sit on L side of bed without physical assistance. Increased time to complete.    Transfers Overall transfer level: Needs assistance Equipment used: None Transfers: Sit to/from Stand Sit to Stand: Supervision           General transfer comment: Pt completed STS from EOB w/out AD and no physical assistance.  Increased time to complete.    Ambulation/Gait Ambulation/Gait assistance: Supervision Gait Distance (Feet): 200 Feet Assistive device: None Gait Pattern/deviations: Step-through pattern, Decreased step length - left, Decreased stance time - left, Decreased stride length, Decreased weight shift to left Gait velocity: reduced Gait velocity interpretation: 1.31 - 2.62 ft/sec, indicative of limited community ambulator   General Gait Details: Pt ambualtes with reciprocal gait pattern and demonstrates a limp like pattern on LLE (from previous hip surgery per pt). No signs of instability or LOB noted.  Stairs            Wheelchair Mobility     Tilt Bed    Modified Rankin (Stroke Patients Only)       Balance                                             Pertinent Vitals/Pain Pain Assessment Pain Assessment: No/denies pain    Home Living Family/patient expects to be discharged to:: Private residence Living Arrangements: Spouse/significant other Available Help at Discharge: Family;Available 24 hours/day (pt's daughter is also coming to stay with her after discharge for a few days) Type of Home: House Home Access: Stairs to enter Entrance Stairs-Rails: Right;Left;Can reach both Entrance Stairs-Number of Steps: 3   Home Layout: Two level;Able to live on main level with bedroom/bathroom Home Equipment: Rolling Walker (2 wheels);Shower seat - built in;Grab bars - tub/shower      Prior  Function Prior Level of Function : Independent/Modified Independent             Mobility Comments: indep ADLs Comments: indep     Extremity/Trunk Assessment   Upper Extremity Assessment Upper Extremity Assessment: Overall WFL for tasks assessed    Lower Extremity Assessment Lower Extremity Assessment: Generalized weakness (grossly 4/5 bilaterally)    Cervical / Trunk Assessment Cervical / Trunk Assessment: Kyphotic  Communication    Communication Communication: No apparent difficulties    Cognition Arousal: Alert Behavior During Therapy: WFL for tasks assessed/performed   PT - Cognitive impairments: No apparent impairments                         Following commands: Intact       Cueing Cueing Techniques: Verbal cues, Gestural cues     General Comments General comments (skin integrity, edema, etc.): SpO2 98% on RA; HR 89 bpm    Exercises     Assessment/Plan    PT Assessment Patient needs continued PT services  PT Problem List Decreased strength;Decreased activity tolerance;Decreased balance;Decreased mobility       PT Treatment Interventions DME instruction;Gait training;Stair training;Functional mobility training;Therapeutic activities;Therapeutic exercise;Balance training    PT Goals (Current goals can be found in the Care Plan section)  Acute Rehab PT Goals Patient Stated Goal: to go home PT Goal Formulation: With patient/family Time For Goal Achievement: 12/14/24 Potential to Achieve Goals: Good    Frequency Min 1X/week     Co-evaluation               AM-PAC PT 6 Clicks Mobility  Outcome Measure Help needed turning from your back to your side while in a flat bed without using bedrails?: None Help needed moving from lying on your back to sitting on the side of a flat bed without using bedrails?: None Help needed moving to and from a bed to a chair (including a wheelchair)?: A Little Help needed standing up from a chair using your arms (e.g., wheelchair or bedside chair)?: A Little Help needed to walk in hospital room?: A Little Help needed climbing 3-5 steps with a railing? : A Lot 6 Click Score: 19    End of Session Equipment Utilized During Treatment: Gait belt Activity Tolerance: Patient tolerated treatment well Patient left: in bed;with call bell/phone within reach;with bed alarm set;with family/visitor present Nurse Communication: Mobility status PT Visit  Diagnosis: Other abnormalities of gait and mobility (R26.89);Muscle weakness (generalized) (M62.81)    Time: 8455-8389 PT Time Calculation (min) (ACUTE ONLY): 26 min   Charges:   PT Evaluation $PT Eval Low Complexity: 1 Low   PT General Charges $$ ACUTE PT VISIT: 1 Visit         Leontine Hilt DPT Acute Rehab Services [phone] Prefer contact via chat   Leontine NOVAK Teryn Boerema 11/30/2024, 4:24 PM

## 2024-11-30 NOTE — Progress Notes (Addendum)
 Patient ambulated to bathroom with nursing assistance, patient's HR increased to 180 per central monitoring.  Dr Noralee (on department) notified. New order for IV lopressor  received.

## 2024-12-01 ENCOUNTER — Other Ambulatory Visit (HOSPITAL_COMMUNITY): Payer: Self-pay

## 2024-12-01 ENCOUNTER — Telehealth: Payer: Self-pay | Admitting: Cardiology

## 2024-12-01 DIAGNOSIS — I10 Essential (primary) hypertension: Secondary | ICD-10-CM | POA: Diagnosis not present

## 2024-12-01 DIAGNOSIS — I5033 Acute on chronic diastolic (congestive) heart failure: Secondary | ICD-10-CM | POA: Diagnosis not present

## 2024-12-01 DIAGNOSIS — K220 Achalasia of cardia: Secondary | ICD-10-CM | POA: Diagnosis not present

## 2024-12-01 DIAGNOSIS — I480 Paroxysmal atrial fibrillation: Secondary | ICD-10-CM | POA: Diagnosis not present

## 2024-12-01 LAB — MAGNESIUM: Magnesium: 2.2 mg/dL (ref 1.7–2.4)

## 2024-12-01 LAB — BASIC METABOLIC PANEL WITH GFR
Anion gap: 13 (ref 5–15)
BUN: 16 mg/dL (ref 8–23)
CO2: 24 mmol/L (ref 22–32)
Calcium: 8.8 mg/dL — ABNORMAL LOW (ref 8.9–10.3)
Chloride: 96 mmol/L — ABNORMAL LOW (ref 98–111)
Creatinine, Ser: 0.76 mg/dL (ref 0.44–1.00)
GFR, Estimated: >60 mL/min
Glucose, Bld: 85 mg/dL (ref 70–99)
Potassium: 3.5 mmol/L (ref 3.5–5.1)
Sodium: 134 mmol/L — ABNORMAL LOW (ref 135–145)

## 2024-12-01 MED ORDER — METOPROLOL SUCCINATE ER 25 MG PO TB24
25.0000 mg | ORAL_TABLET | Freq: Every day | ORAL | 0 refills | Status: AC
  Filled 2024-12-01: qty 30, 30d supply, fill #0

## 2024-12-01 MED ORDER — AMIODARONE HCL 200 MG PO TABS: 200.0000 mg | ORAL_TABLET | Freq: Two times a day (BID) | ORAL | Status: DC

## 2024-12-01 MED ORDER — ELIQUIS 5 MG PO TABS
5.0000 mg | ORAL_TABLET | Freq: Two times a day (BID) | ORAL | 3 refills | Status: DC
  Filled 2024-12-01: qty 180, 90d supply, fill #0
  Filled 2024-12-01: qty 60, 30d supply, fill #0

## 2024-12-01 MED ORDER — AMIODARONE HCL 200 MG PO TABS
ORAL_TABLET | ORAL | 0 refills | Status: DC
  Filled 2024-12-01: qty 44, 37d supply, fill #0

## 2024-12-01 MED ORDER — POTASSIUM CHLORIDE CRYS ER 20 MEQ PO TBCR
40.0000 meq | EXTENDED_RELEASE_TABLET | ORAL | Status: AC
  Administered 2024-12-01 (×2): 40 meq via ORAL
  Filled 2024-12-01 (×2): qty 2

## 2024-12-01 MED ORDER — AMIODARONE HCL 200 MG PO TABS: 200.0000 mg | ORAL_TABLET | Freq: Every day | ORAL | Status: DC

## 2024-12-01 MED ORDER — AMIODARONE HCL 200 MG PO TABS
200.0000 mg | ORAL_TABLET | Freq: Two times a day (BID) | ORAL | Status: DC
  Administered 2024-12-01: 200 mg via ORAL
  Filled 2024-12-01: qty 1

## 2024-12-01 NOTE — Telephone Encounter (Signed)
 Pt c/o medication issue:  1. Name of Medication:  apixaban  (ELIQUIS ) tablet 5 mg  2. How are you currently taking this medication (dosage and times per day)? As written  3. Are you having a reaction (difficulty breathing--STAT)? no  4. What is your medication issue? Pt spouse called in, medication is too expensive and pt is completely out. Asking about grant for Eliquis  or another option.    Please advise.

## 2024-12-01 NOTE — Progress Notes (Signed)
 OT Cancellation Note  Patient Details Name: Kathryn [name] MRN: [id number] DOB: 06/01/1956   Cancelled Treatment:    Reason Eval/Treat Not Completed: OT screened, no needs identified, will sign off. Per secure chat with PT, pt at baseline with ADLs. Pt with appropriate family assistance available at d/c, and no DME needs. OT is signing off on this pt.  Daiana Vitiello C, OT  Acute Rehabilitation Services Office [phone] Secure chat preferred   Kathryn [name] Savers 12/01/2024, 7:49 AM

## 2024-12-01 NOTE — TOC CM/SW Note (Signed)
 Transition of Care [facility]) - Inpatient Brief Assessment   Patient Details  Name: Kathryn [name] MRN: [id number] Date of Birth: 1956/07/17  Transition of [facility] CM/SW Contact:    Sudie Erminio Deems, RN Phone Number: 12/01/2024, 12:34 PM   Clinical Narrative: Patient presented for shortness of breath. PTA patient was from home with spouse. Spouse was at the bedside during the visit. Patient states she has a cane and rolling walker; however, she does not use them. Patient has PCP and has no issues with transportation. No home needs identified at this time.   Transition of Care Asessment: Insurance and Status: Insurance coverage has been reviewed Patient has primary care physician: Yes Home environment has been reviewed: reviewed Prior level of function:: independent Prior/Current Home Services: No current home services Social Drivers of Health Review: SDOH reviewed no interventions necessary Readmission risk has been reviewed: Yes Transition of care needs: no transition of care needs at this time

## 2024-12-01 NOTE — Discharge Summary (Signed)
 " Physician Discharge Summary   Patient: Kathryn [name] MRN: [id number] DOB: Dec 17, 1955  Admit date:     11/28/2024  Discharge date: 12/01/2024  Discharge Physician: Kathryn [name]   PCP: Kathryn Ole DASEN, MD (Inactive)   Recommendations at discharge:    Patient has been placed on amiodarone  oral load 200 mg bid for 7 days and then continue with 200 mg daily, to keep sinus rhythm.  Discontinue diltiazem  and flecainide  Reduced metoprolol  to 25 mg succinate daily.  Follow up renal function and electrolytes in 7 days as outpatient Follow up with Primary Care in 7 to 10 days Follow up with Cardiology as scheduled.   Discharge Diagnoses: Principal Problem:   Paroxysmal atrial fibrillation with RVR (HCC) Active Problems:   Acute on chronic diastolic CHF (congestive heart failure) (HCC)   Essential hypertension   Esophageal achalasia   Lumbar radiculopathy, chronic   Hyponatremia   Hypothyroidism   Obesity, class 1  Resolved Problems:   * No resolved hospital problems. [facility] Course: Kathryn [name] was admitted to the hospital with the working diagnosis of atrial fibrillation.   69 yo female with the past medical history of hypertension, hyperlipidemia, heart failure, atrial fibrillation and obesity who presented with dyspnea.  Patient reported flu like symptoms for the last 2 to 3 days, with wheezing, diarrhea and body aches, that progressed to severe dyspnea. EMS was called and she was found in respiratory distress, cyanotic with 02 saturation 76% on room air, with heart rate of 280 bpm, she was placed on non re breather mask, and received adenosine  plus diltiazem , then transported to the ED.  On her initial physical examination her blood pressure was 92/79, HR 155, RR 23 and 02 saturation 96% Lungs with coarse breath sounds bilaterally and expiratory wheezing, heart with S1 and S2 present and tachycardic, irregularly irregular with no gallops, rubs or murmurs, abdomen soft  and non tender, positive lower extremity edema ++  VBG 7,50/ 35.8/ 59/ 28/ 92%  Na 135, K 2.4 Cl 91 bicarbonate 24 glucose 158 bun 20 cr 0,91  AST 25 ALT 12 Pro BNP 2,408 High sensitive troponin 16 and 19  Wbc 8,3 hgb 16.7 plt 250   Influenza negative RSV negative Covid 19 negative  Rhinovirus positive   Chest radiograph with left rotation, positive cardiomegaly, bilateral hilar vascular congestion, with no infiltrates or effusions.   CT chest with no pulmonary embolism, minimal atelectatic changes in the bilateral lower lobes.   EKG 137 bpm, normal axis, normal intervals, qtc 399, atrial fibrillation with no significant ST segment or T wave changes.   Patient was placed on IV furosemide  for diuresis and IV amiodarone  for rate control atrial fibrillation.  01/08 added metoprolol , patient converted to sinus rhythm.  01/09 patient clinically improved, plan to continue oral amiodarone  and follow up as outpatient.   Assessment and Plan: * Paroxysmal atrial fibrillation with RVR (HCC) Patient was placed on IV amiodarone  and po metoprolol , she converted to sinus rhythm.  Plan to continue low dose metoprolol  and oral amiodarone . Discontinue diltiazem  and flecainide   Continue anticoagulation with apixaban .   Acute on chronic diastolic CHF (congestive heart failure) (HCC) Echocardiogram with preserved LV systolic function with EF 60 to 65%, grade II diastolic dysfunction with pseudo normalization EA, RV systolic function preserved, LA and RA with moderate dilatation, mild mitral valve regurgitation, mild aortic stenosis, not able to assess PA pressure   Patient was placed on amiodarone  for rate control atrial fibrillation Added  low dose metoprolol  with good toleration. Continue furosemide  as needed for volume overload.   Acute hypoxemic respiratory failure, due to cardiogenic pulmonary edema (acute)  Positive for rhinovirus  Patient initially was placed on Bipap for respiratory  support, then successfully transitioned to nasal cannula.  Patient was placed on bronchodilator therapy and inhaled corticosteroids during her hospitalization  At the time of her discharge her 02 saturation was 95% on room air.   Essential hypertension Patient will continue metoprolol  for blood pressure control.  Diuretic therapy with hydrochlorothiazide .   Esophageal achalasia Monitor po intake, patient tolerating po well.  Follow up as outpatient   Hyponatremia Hypokalemia.   At the time of discharge her renal function is stable with serum cr at 0,76 with K at 3,5 and serum bicarbonate at 24 Na 134 and Mg 2.2   She will receive 40 meq Kcl prior to discharge and plan to follow up renal function as outpatient.   Lumbar radiculopathy, chronic Positive hardware in the spine Continue gabapentin , methocarbamol , and duloxetine  Continue bupropion and oxycodone    Hypothyroidism Continue levothyroxine    Obesity, class 1 Calculated BMI is 33,0        Consultants: Cardiology  Procedures performed: none   Disposition: Home Diet recommendation:  Cardiac diet DISCHARGE MEDICATION: Allergies as of 12/01/2024       Reactions   Warfarin Sodium  Other (See Comments)   Unknown        Medication List     STOP taking these medications    diltiazem  30 MG tablet Commonly known as: Cardizem    flecainide  50 MG tablet Commonly known as: TAMBOCOR    pantoprazole  40 MG tablet Commonly known as: PROTONIX    rivaroxaban  20 MG Tabs tablet Commonly known as: XARELTO        TAKE these medications    acetaminophen  500 MG tablet Commonly known as: TYLENOL  Take 500-1,000 mg by mouth every 6 (six) hours as needed (pain.).   amiodarone  200 MG tablet Commonly known as: PACERONE  Take 1 tablet (200 mg total) by mouth 2 (two) times daily for 7 days, THEN 1 tablet (200 mg total) daily. Start taking on: December 01, 2024   buPROPion 150 MG 24 hr tablet Commonly known as: WELLBUTRIN  XL Take 150 mg by mouth every morning.   colchicine  0.6 MG tablet Take 0.6 mg by mouth as needed (gout flare).   D3 PO Take 1 tablet by mouth daily.   dicyclomine 20 MG tablet Commonly known as: BENTYL Take 60 mg by mouth daily.   DULoxetine  60 MG capsule Commonly known as: CYMBALTA  Take 60 mg by mouth every evening. 60 mg + 30 mg=90 mg   Eliquis  5 MG Tabs tablet Generic drug: apixaban  Take 5 mg by mouth 2 (two) times daily.   furosemide  20 MG tablet Commonly known as: LASIX  TAKE 1 TABLET BY MOUTH DAILY AS NEEDED   gabapentin  600 MG tablet Commonly known as: NEURONTIN  Take 600 mg by mouth 3 (three) times daily.   hydrochlorothiazide  25 MG tablet Commonly known as: HYDRODIURIL  Take 1 tablet by mouth once daily   levothyroxine  200 MCG tablet Commonly known as: SYNTHROID  Take 200 mcg by mouth daily before breakfast.   methocarbamol  500 MG tablet Commonly known as: ROBAXIN  Take 500 mg by mouth 4 (four) times daily.   metoprolol  succinate 25 MG 24 hr tablet Commonly known as: TOPROL -XL Take 1 tablet (25 mg total) by mouth daily. Start taking on: December 02, 2024 What changed:  medication strength how much to  take how to take this when to take this additional instructions   Movantik 25 MG Tabs tablet Generic drug: naloxegol oxalate Take 25 mg by mouth daily as needed.   omeprazole  40 MG capsule Commonly known as: PRILOSEC  Take 40 mg by mouth 2 (two) times daily.   oxyCODONE -acetaminophen  7.5-325 MG tablet Commonly known as: PERCOCET Take 1 tablet by mouth every 8 (eight) hours as needed.   Salonpas 3.11-28-08 % Ptch Generic drug: Camphor-Menthol -Methyl Sal Place 1 patch onto the skin daily as needed (left hip pain).   VITAMIN B-12 PO Take 1 tablet by mouth in the morning.        Discharge Exam: Filed Weights   11/28/24 2300 11/29/24 1521  Weight: 88.5 kg 87.3 kg   BP (!) 169/84   Pulse 65   Temp 98 F (36.7 C)   Resp 18   Ht 5' 4 (1.626 m)    Wt 87.3 kg   LMP  (LMP Unknown)   SpO2 95%   BMI 33.04 kg/m   Patient with no chest pain or dyspnea, no cough, no PND, orthopnea or lower extremity edema.   Neurology awake and alert ENT with mild pallor with no icterus.  Cardiovascular with S1 and S2 present and regular with no gallops or rubs, positive systolic murmur at the apex Respiratory with no rales or wheezing, no rhonchi Abdomen with no distention No lower extremity edema   Condition at discharge: stable  The results of significant diagnostics from this hospitalization (including imaging, microbiology, ancillary and laboratory) are listed below for reference.   Imaging Studies: ECHOCARDIOGRAM COMPLETE Result Date: 11/29/2024    ECHOCARDIOGRAM REPORT   Patient Name:   DEVERA ENGLANDER [facility] Date of Exam: 11/29/2024 Medical Rec #:  [id number]        Height:       64.0 in Accession #:    [id number]       Weight:       195.1 lb Date of Birth:  [birth year]         BSA:          1.936 m Patient Age:    68 years         BP:           139/104 mmHg Patient Gender: F                HR:           107 bpm. Exam Location:  Inpatient Procedure: 2D Echo, Cardiac Doppler, Color Doppler and Intracardiac            Opacification Agent (Both Spectral and Color Flow Doppler were            utilized during procedure). Indications:    CHF- acute diastolic 150.31  History:        Patient has prior history of Echocardiogram examinations, most                 recent 01/12/2023. Arrythmias:Atrial Fibrillation; Risk                 Factors:Hypertension and Dyslipidemia.  Sonographer:    Merlynn Argyle Referring Phys: [id number] OLADAPO ADEFESO  Sonographer Comments: Image acquisition challenging due to patient body habitus and Image acquisition challenging due to respiratory motion. IMPRESSIONS  1. Left ventricular ejection fraction, by estimation, is 60 to 65%. The left ventricle has normal function. The left ventricle has no regional wall motion abnormalities. Left ventricular  diastolic parameters are consistent  with Grade II diastolic dysfunction (pseudonormalization).  2. Right ventricular systolic function is normal. The right ventricular size is normal. Tricuspid regurgitation signal is inadequate for assessing PA pressure.  3. Left atrial size was moderately dilated.  4. Right atrial size was moderately dilated.  5. The mitral valve is normal in structure. Mild mitral valve regurgitation. No evidence of mitral stenosis.  6. The aortic valve is tricuspid. There is moderate calcification of the aortic valve. Aortic valve regurgitation is not visualized. Mild aortic valve stenosis. Aortic valve area, by VTI measures 1.79 cm. Aortic valve mean gradient measures 15.0 mmHg. Aortic valve Vmax measures 2.50 m/s.  7. The inferior vena cava is dilated in size with >50% respiratory variability, suggesting right atrial pressure of 8 mmHg. FINDINGS  Left Ventricle: Left ventricular ejection fraction, by estimation, is 60 to 65%. The left ventricle has normal function. The left ventricle has no regional wall motion abnormalities. Definity  contrast agent was given IV to delineate the left ventricular  endocardial borders. The left ventricular internal cavity size was normal in size. There is no left ventricular hypertrophy. Left ventricular diastolic parameters are consistent with Grade II diastolic dysfunction (pseudonormalization). Right Ventricle: The right ventricular size is normal. No increase in right ventricular wall thickness. Right ventricular systolic function is normal. Tricuspid regurgitation signal is inadequate for assessing PA pressure. Left Atrium: Left atrial size was moderately dilated. Right Atrium: Right atrial size was moderately dilated. Pericardium: There is no evidence of pericardial effusion. Mitral Valve: The mitral valve is normal in structure. Mild mitral annular calcification. Mild mitral valve regurgitation. No evidence of mitral valve stenosis. Tricuspid Valve: The  tricuspid valve is normal in structure. Tricuspid valve regurgitation is trivial. No evidence of tricuspid stenosis. Aortic Valve: The aortic valve is tricuspid. There is moderate calcification of the aortic valve. Aortic valve regurgitation is not visualized. Mild aortic stenosis is present. Aortic valve mean gradient measures 15.0 mmHg. Aortic valve peak gradient measures 25.0 mmHg. Aortic valve area, by VTI measures 1.79 cm. Pulmonic Valve: The pulmonic valve was not well visualized. Pulmonic valve regurgitation is trivial. No evidence of pulmonic stenosis. Aorta: The aortic root is normal in size and structure. Venous: The inferior vena cava is dilated in size with greater than 50% respiratory variability, suggesting right atrial pressure of 8 mmHg. IAS/Shunts: No atrial level shunt detected by color flow Doppler.  LEFT VENTRICLE PLAX 2D LVIDd:         5.80 cm   Diastology LVIDs:         3.60 cm   LV e' medial:    6.84 cm/s LV PW:         1.00 cm   LV E/e' medial:  17.8 LV IVS:        0.90 cm   LV e' lateral:   8.86 cm/s LVOT diam:     2.00 cm   LV E/e' lateral: 13.8 LV SV:         98 LV SV Index:   50 LVOT Area:     3.14 cm  RIGHT VENTRICLE          IVC RV Basal diam:  2.90 cm  IVC diam: 2.30 cm TAPSE (M-mode): 2.1 cm LEFT ATRIUM             Index        RIGHT ATRIUM           Index LA diam:        4.20 cm 2.17  cm/m   RA Area:     17.00 cm LA Vol (A2C):   88.9 ml 45.92 ml/m  RA Volume:   40.30 ml  20.82 ml/m LA Vol (A4C):   46.4 ml 23.97 ml/m LA Biplane Vol: 64.3 ml 33.21 ml/m  AORTIC VALVE AV Area (Vmax):    1.77 cm AV Area (Vmean):   1.65 cm AV Area (VTI):     1.79 cm AV Vmax:           250.00 cm/s AV Vmean:          183.500 cm/s AV VTI:            0.546 m AV Peak Grad:      25.0 mmHg AV Mean Grad:      15.0 mmHg LVOT Vmax:         141.00 cm/s LVOT Vmean:        96.600 cm/s LVOT VTI:          0.311 m LVOT/AV VTI ratio: 0.57  AORTA Ao Root diam: 3.40 cm Ao Asc diam:  3.50 cm MITRAL VALVE MV Area  (PHT): 3.54 cm     SHUNTS MV Decel Time: 214 msec     Systemic VTI:  0.31 m MV E velocity: 122.00 cm/s  Systemic Diam: 2.00 cm MV A velocity: 117.00 cm/s MV E/A ratio:  1.04 Toribio Fuel MD Electronically signed by Toribio Fuel MD Signature Date/Time: 11/29/2024/10:20:49 PM    Final    CT Angio Chest PE W and/or Wo Contrast Result Date: 11/28/2024 EXAM: CTA CHEST 11/28/2024 09:40:52 PM TECHNIQUE: CTA of the chest was performed without and with the administration of 75 mL of iohexol  (OMNIPAQUE ) 350 MG/ML injection. Multiplanar reformatted images are provided for review. MIP images are provided for review. Automated exposure control, iterative reconstruction, and/or weight based adjustment of the mA/kV was utilized to reduce the radiation dose to as low as reasonably achievable. COMPARISON: None available. CLINICAL HISTORY: Pulmonary embolism (PE) suspected, low to intermediate prob, neg D-dimer. FINDINGS: PULMONARY ARTERIES: Pulmonary arteries are adequately opacified for evaluation. No acute pulmonary embolus. Main pulmonary artery is normal in caliber. MEDIASTINUM: The heart and pericardium demonstrate no acute abnormality. There are atherosclerotic calcifications throughout the aorta. LYMPH NODES: No mediastinal, hilar or axillary lymphadenopathy. LUNGS AND PLEURA: There are minimal atelectatic changes in the bilateral lower lobes. The lungs are otherwise clear. No focal consolidation or pulmonary edema. No evidence of pleural effusion or pneumothorax. UPPER ABDOMEN: Limited images of the upper abdomen are unremarkable. SOFT TISSUES AND BONES: Retuberoplasty and surgical changes are seen in the lower thoracic and lumbar region with severe degenerative changes, partially imaged. No acute soft tissue abnormality. IMPRESSION: 1. No pulmonary embolism. 2. Minimal atelectatic changes in the bilateral lower lobes, with aortic atherosclerosis. Electronically signed by: Greig Pique MD 11/28/2024 09:48 PM EST RP  Workstation: [id number]   DG Chest Port 1 View Result Date: 11/28/2024 CLINICAL DATA:  Shortness of breath, respiratory distress and hypoxia. EXAM: PORTABLE CHEST 1 VIEW COMPARISON:  11/20/2020 FINDINGS: The heart size and mediastinal contours are within normal limits. There is no evidence of pulmonary edema, consolidation, pneumothorax or pleural fluid. The visualized skeletal structures are unremarkable. IMPRESSION: No active disease. Electronically Signed   By: Marcey Moan M.D.   On: 11/28/2024 17:07    Microbiology: Results for orders placed or performed during the hospital encounter of 11/28/24  Resp panel by RT-PCR (RSV, Flu A&B, Covid) Anterior Nasal Swab     Status: None  Collection Time: 11/28/24  4:00 PM   Specimen: Anterior Nasal Swab  Result Value Ref Range Status   SARS Coronavirus 2 by RT PCR NEGATIVE NEGATIVE Final   Influenza A by PCR NEGATIVE NEGATIVE Final   Influenza B by PCR NEGATIVE NEGATIVE Final    Comment: (NOTE) The Xpert Xpress SARS-CoV-2/FLU/RSV plus assay is intended as an aid in the diagnosis of influenza from Nasopharyngeal swab specimens and should not be used as a sole basis for treatment. Nasal washings and aspirates are unacceptable for Xpert Xpress SARS-CoV-2/FLU/RSV testing.  Fact Sheet for Patients: bloggercourse.com  Fact Sheet for Healthcare Providers: seriousbroker.it  This test is not yet approved or cleared by the [place]  FDA and has been authorized for detection and/or diagnosis of SARS-CoV-2 by FDA under an Emergency Use Authorization (EUA). This EUA will remain in effect (meaning this test can be used) for the duration of the COVID-19 declaration under Section 564(b)(1) of the Act, 21 U.S.C. section 360bbb-3(b)(1), unless the authorization is terminated or revoked.     Resp Syncytial Virus by PCR NEGATIVE NEGATIVE Final    Comment: (NOTE) Fact Sheet for  Patients: bloggercourse.com  Fact Sheet for Healthcare Providers: seriousbroker.it  This test is not yet approved or cleared by the [place]  FDA and has been authorized for detection and/or diagnosis of SARS-CoV-2 by FDA under an Emergency Use Authorization (EUA). This EUA will remain in effect (meaning this test can be used) for the duration of the COVID-19 declaration under Section 564(b)(1) of the Act, 21 U.S.C. section 360bbb-3(b)(1), unless the authorization is terminated or revoked.  Performed at [facility], [address] [place], [place] [address]   Blood culture (routine x 2)     Status: None (Preliminary result)   Collection Time: 11/28/24  6:51 PM   Specimen: BLOOD RIGHT ARM  Result Value Ref Range Status   Specimen Description BLOOD RIGHT ARM UPPER  Final   Special Requests   Final    BOTTLES DRAWN AEROBIC AND ANAEROBIC Blood Culture adequate volume   Culture   Final    NO GROWTH 3 DAYS Performed at [facility], [address], [place], [place] [address]    Report Status PENDING  Incomplete  Blood culture (routine x 2)     Status: None (Preliminary result)   Collection Time: 11/29/24  4:45 AM   Specimen: BLOOD  Result Value Ref Range Status   Specimen Description BLOOD RIGHT ANTECUBITAL  Final   Special Requests   Final    BOTTLES DRAWN AEROBIC AND ANAEROBIC Blood Culture results may not be optimal due to an inadequate volume of blood received in culture bottles   Culture   Final    NO GROWTH 2 DAYS Performed at [facility], [address] [place], [place] [address]    Report Status PENDING  Incomplete  Respiratory (~20 pathogens) panel by PCR     Status: Abnormal   Collection Time: 11/29/24  5:12 PM   Specimen: Nasopharyngeal Swab; Respiratory  Result Value Ref Range Status   Adenovirus NOT DETECTED NOT DETECTED Final   Coronavirus 229E NOT DETECTED NOT DETECTED Final    Comment:  (NOTE) The Coronavirus on the Respiratory Panel, DOES NOT test for the novel  Coronavirus (2019 nCoV)    Coronavirus HKU1 NOT DETECTED NOT DETECTED Final   Coronavirus NL63 NOT DETECTED NOT DETECTED Final   Coronavirus OC43 NOT DETECTED NOT DETECTED Final   Metapneumovirus DETECTED (A) NOT DETECTED  Final   Rhinovirus / Enterovirus NOT DETECTED NOT DETECTED Final   Influenza A NOT DETECTED NOT DETECTED Final   Influenza B NOT DETECTED NOT DETECTED Final   Parainfluenza Virus 1 NOT DETECTED NOT DETECTED Final   Parainfluenza Virus 2 NOT DETECTED NOT DETECTED Final   Parainfluenza Virus 3 NOT DETECTED NOT DETECTED Final   Parainfluenza Virus 4 NOT DETECTED NOT DETECTED Final   Respiratory Syncytial Virus NOT DETECTED NOT DETECTED Final   Bordetella pertussis NOT DETECTED NOT DETECTED Final   Bordetella Parapertussis NOT DETECTED NOT DETECTED Final   Chlamydophila pneumoniae NOT DETECTED NOT DETECTED Final   Mycoplasma pneumoniae NOT DETECTED NOT DETECTED Final    Comment: Performed at [facility], [address], [place], [place] [address]    Labs: CBC: Recent Labs  Lab 11/28/24 1646 11/28/24 1753 11/28/24 1812 11/29/24 0445  WBC 8.3  --   --  6.7  HGB 16.7* 16.0* 16.3* 14.2  HCT 48.0* 47.0* 48.0* 41.5  MCV 88.1  --   --  88.7  PLT 250  --   --  239   Basic Metabolic Panel: Recent Labs  Lab 11/28/24 1809 11/28/24 1812 11/28/24 2034 11/29/24 0445 11/30/24 0552 12/01/24 0511  NA 135 134*  --  133* 131* 134*  K 2.4* 2.4*  --  3.3* 3.4* 3.5  CL 91* 94*  --  93* 94* 96*  CO2 24  --   --  25 23 24   GLUCOSE 158* 162*  --  167* 103* 85  BUN 20 21  --  22 18 16   CREATININE 0.91 1.00  --  0.80 0.74 0.76  CALCIUM 9.1  --   --  8.9 9.3 8.8*  MG  --   --  2.6* 2.5* 2.6* 2.2  PHOS  --   --   --  2.7  --   --    Liver Function Tests: Recent Labs  Lab 11/28/24 1809 11/29/24 0445  AST 25 20  ALT 12 8  ALKPHOS 99 87  BILITOT 0.8 0.4  PROT 7.0 6.7  ALBUMIN  3.8 3.8    CBG: No results for input(s): GLUCAP in the last 168 hours.  Discharge time spent: greater than 30 minutes.  Signed: Elidia Toribio Furnace, MD [facility] 12/01/2024 "

## 2024-12-01 NOTE — Progress Notes (Signed)
 "  Progress Note  Patient Name: Kathryn [name] Date of Encounter: 12/01/2024  Primary Cardiologist: Lamika Connolly, DO   Subjective   Patient seen and examined at her bedside. She was sitting in bed her husband by the bedside.   Inpatient Medications    Scheduled Meds:  apixaban   5 mg Oral BID   budesonide  (PULMICORT ) nebulizer solution  0.25 mg Nebulization BID   dextromethorphan -guaiFENesin   1 tablet Oral BID   levothyroxine   200 mcg Oral QAC breakfast   metoprolol  succinate  25 mg Oral Daily   pantoprazole   40 mg Oral Daily   potassium chloride   40 mEq Oral Q4H   Continuous Infusions:  amiodarone  30 mg/hr (12/01/24 0843)   PRN Meds: acetaminophen  **OR** acetaminophen , albuterol , metoprolol  tartrate, ondansetron  **OR** ondansetron  (ZOFRAN ) IV, mouth rinse, sodium chloride    Vital Signs    Vitals:   11/30/24 1946 12/01/24 0002 12/01/24 0412 12/01/24 0800  BP: 139/76 (!) 152/87 (!) 172/95 (!) 169/84  Pulse: 78 65 70 65  Resp: 16 18 16 18   Temp: 98.3 F (36.8 C) 98.3 F (36.8 C) 97.9 F (36.6 C)   TempSrc: Oral Oral Oral   SpO2: 99% 97% 94% 95%  Weight:      Height:        Intake/Output Summary (Last 24 hours) at 12/01/2024 1038 Last data filed at 12/01/2024 0843 Gross per 24 hour  Intake 924.39 ml  Output --  Net 924.39 ml   Filed Weights   11/28/24 2300 11/29/24 1521  Weight: 88.5 kg 87.3 kg    Telemetry     Sinus rhythm  - Personally Reviewed  ECG     - Personally Reviewed  Physical Exam    General: Comfortable Head: Atraumatic, normal size  Eyes: PEERLA, EOMI  Neck: Supple, normal JVD Cardiac: Normal S1, S2; RRR; no murmurs, rubs, or gallops Lungs: Clear to auscultation bilaterally Abd: Soft, nontender, no hepatomegaly  Ext: warm, no edema   Labs    Chemistry Recent Labs  Lab 11/28/24 1809 11/28/24 1812 11/29/24 0445 11/30/24 0552 12/01/24 0511  NA 135   < > 133* 131* 134*  K 2.4*   < > 3.3* 3.4* 3.5  CL 91*   < > 93* 94* 96*  CO2  24  --  25 23 24   GLUCOSE 158*   < > 167* 103* 85  BUN 20   < > 22 18 16   CREATININE 0.91   < > 0.80 0.74 0.76  CALCIUM 9.1  --  8.9 9.3 8.8*  PROT 7.0  --  6.7  --   --   ALBUMIN  3.8  --  3.8  --   --   AST 25  --  20  --   --   ALT 12  --  8  --   --   ALKPHOS 99  --  87  --   --   BILITOT 0.8  --  0.4  --   --   GFRNONAA >60  --  >60 >60 >60  ANIONGAP 20*  --  15 14 13    < > = values in this interval not displayed.     Hematology Recent Labs  Lab 11/28/24 1646 11/28/24 1753 11/28/24 1812 11/29/24 0445  WBC 8.3  --   --  6.7  RBC 5.45*  --   --  4.68  HGB 16.7* 16.0* 16.3* 14.2  HCT 48.0* 47.0* 48.0* 41.5  MCV 88.1  --   --  88.7  MCH 30.6  --   --  30.3  MCHC 34.8  --   --  34.2  RDW 14.6  --   --  14.8  PLT 250  --   --  239    Cardiac EnzymesNo results for input(s): TROPONINI in the last 168 hours. No results for input(s): TROPIPOC in the last 168 hours.   BNP Recent Labs  Lab 11/28/24 1811  PROBNP 2,408.0*     DDimer No results for input(s): DDIMER in the last 168 hours.   Radiology    ECHOCARDIOGRAM COMPLETE Result Date: 11/29/2024    ECHOCARDIOGRAM REPORT   Patient Name:   [name] [facility] Date of Exam: 11/29/2024 Medical Rec #:  [id number]        Height:       64.0 in Accession #:    [id number]       Weight:       195.1 lb Date of Birth:  Jul 15, 1956         BSA:          1.936 m Patient Age:    68 years         BP:           139/104 mmHg Patient Gender: F                HR:           107 bpm. Exam Location:  Inpatient Procedure: 2D Echo, Cardiac Doppler, Color Doppler and Intracardiac            Opacification Agent (Both Spectral and Color Flow Doppler were            utilized during procedure). Indications:    CHF- acute diastolic 150.31  History:        Patient has prior history of Echocardiogram examinations, most                 recent 01/12/2023. Arrythmias:Atrial Fibrillation; Risk                 Factors:Hypertension and Dyslipidemia.  Sonographer:     Merlynn Argyle Referring Phys: [id number] OLADAPO ADEFESO  Sonographer Comments: Image acquisition challenging due to patient body habitus and Image acquisition challenging due to respiratory motion. IMPRESSIONS  1. Left ventricular ejection fraction, by estimation, is 60 to 65%. The left ventricle has normal function. The left ventricle has no regional wall motion abnormalities. Left ventricular diastolic parameters are consistent with Grade II diastolic dysfunction (pseudonormalization).  2. Right ventricular systolic function is normal. The right ventricular size is normal. Tricuspid regurgitation signal is inadequate for assessing PA pressure.  3. Left atrial size was moderately dilated.  4. Right atrial size was moderately dilated.  5. The mitral valve is normal in structure. Mild mitral valve regurgitation. No evidence of mitral stenosis.  6. The aortic valve is tricuspid. There is moderate calcification of the aortic valve. Aortic valve regurgitation is not visualized. Mild aortic valve stenosis. Aortic valve area, by VTI measures 1.79 cm. Aortic valve mean gradient measures 15.0 mmHg. Aortic valve Vmax measures 2.50 m/s.  7. The inferior vena cava is dilated in size with >50% respiratory variability, suggesting right atrial pressure of 8 mmHg. FINDINGS  Left Ventricle: Left ventricular ejection fraction, by estimation, is 60 to 65%. The left ventricle has normal function. The left ventricle has no regional wall motion abnormalities. Definity  contrast agent was given IV to delineate the left ventricular  endocardial borders. The left  ventricular internal cavity size was normal in size. There is no left ventricular hypertrophy. Left ventricular diastolic parameters are consistent with Grade II diastolic dysfunction (pseudonormalization). Right Ventricle: The right ventricular size is normal. No increase in right ventricular wall thickness. Right ventricular systolic function is normal. Tricuspid regurgitation signal  is inadequate for assessing PA pressure. Left Atrium: Left atrial size was moderately dilated. Right Atrium: Right atrial size was moderately dilated. Pericardium: There is no evidence of pericardial effusion. Mitral Valve: The mitral valve is normal in structure. Mild mitral annular calcification. Mild mitral valve regurgitation. No evidence of mitral valve stenosis. Tricuspid Valve: The tricuspid valve is normal in structure. Tricuspid valve regurgitation is trivial. No evidence of tricuspid stenosis. Aortic Valve: The aortic valve is tricuspid. There is moderate calcification of the aortic valve. Aortic valve regurgitation is not visualized. Mild aortic stenosis is present. Aortic valve mean gradient measures 15.0 mmHg. Aortic valve peak gradient measures 25.0 mmHg. Aortic valve area, by VTI measures 1.79 cm. Pulmonic Valve: The pulmonic valve was not well visualized. Pulmonic valve regurgitation is trivial. No evidence of pulmonic stenosis. Aorta: The aortic root is normal in size and structure. Venous: The inferior vena cava is dilated in size with greater than 50% respiratory variability, suggesting right atrial pressure of 8 mmHg. IAS/Shunts: No atrial level shunt detected by color flow Doppler.  LEFT VENTRICLE PLAX 2D LVIDd:         5.80 cm   Diastology LVIDs:         3.60 cm   LV e' medial:    6.84 cm/s LV PW:         1.00 cm   LV E/e' medial:  17.8 LV IVS:        0.90 cm   LV e' lateral:   8.86 cm/s LVOT diam:     2.00 cm   LV E/e' lateral: 13.8 LV SV:         98 LV SV Index:   50 LVOT Area:     3.14 cm  RIGHT VENTRICLE          IVC RV Basal diam:  2.90 cm  IVC diam: 2.30 cm TAPSE (M-mode): 2.1 cm LEFT ATRIUM             Index        RIGHT ATRIUM           Index LA diam:        4.20 cm 2.17 cm/m   RA Area:     17.00 cm LA Vol (A2C):   88.9 ml 45.92 ml/m  RA Volume:   40.30 ml  20.82 ml/m LA Vol (A4C):   46.4 ml 23.97 ml/m LA Biplane Vol: 64.3 ml 33.21 ml/m  AORTIC VALVE AV Area (Vmax):    1.77 cm  AV Area (Vmean):   1.65 cm AV Area (VTI):     1.79 cm AV Vmax:           250.00 cm/s AV Vmean:          183.500 cm/s AV VTI:            0.546 m AV Peak Grad:      25.0 mmHg AV Mean Grad:      15.0 mmHg LVOT Vmax:         141.00 cm/s LVOT Vmean:        96.600 cm/s LVOT VTI:          0.311 m LVOT/AV VTI ratio:  0.57  AORTA Ao Root diam: 3.40 cm Ao Asc diam:  3.50 cm MITRAL VALVE MV Area (PHT): 3.54 cm     SHUNTS MV Decel Time: 214 msec     Systemic VTI:  0.31 m MV E velocity: 122.00 cm/s  Systemic Diam: 2.00 cm MV A velocity: 117.00 cm/s MV E/A ratio:  1.04 Toribio Fuel MD Electronically signed by Toribio Fuel MD Signature Date/Time: 11/29/2024/10:20:49 PM    Final     Cardiac Studies   Echo  Patient Profile     69 y.o. female  with PAF now in afib rvr   Assessment & Plan    Atrial fibrillation with rvr  Mild aortic stenosis  Nonobstructive CAD  Viral infection   Converted to sinus rhythm.  Will go ahead and transition her to p.o. Amio 200 mg twice a day for 7 days and then 200 mg daily.  Will hold off on restarting flecainide  until she is back into the office.   Continue her Eliquis   Please keep k > 4 and mag > 2  No anginal symptoms. From a cardiovascular standpoint she can be discharged to home   For questions or updates, please contact [facility] Please consult [email or web address] for contact info under Cardiology/STEMI.      Signed, Mikela Senn, DO  12/01/2024, 10:38 AM    "

## 2024-12-01 NOTE — Telephone Encounter (Signed)
 Spoke with the patient's husband and advised on information from med assist team. They will look into pradaxa vs. Xarelto . Patient is currently in the hospital and should be discharged today. He states he would like to try and use [place] [facility] for her prescriptions. I will send her eliquis  to our pharmacy and he will check to see if it is affordable.

## 2024-12-03 LAB — CULTURE, BLOOD (ROUTINE X 2)
Culture: NO GROWTH
Special Requests: ADEQUATE

## 2024-12-04 ENCOUNTER — Ambulatory Visit: Payer: Self-pay

## 2024-12-04 LAB — CULTURE, BLOOD (ROUTINE X 2): Culture: NO GROWTH

## 2024-12-15 ENCOUNTER — Telehealth (HOSPITAL_BASED_OUTPATIENT_CLINIC_OR_DEPARTMENT_OTHER): Payer: Self-pay

## 2024-12-15 NOTE — Telephone Encounter (Signed)
"  ° °  Pre-operative Risk Assessment    Patient Name: Kathryn [name]  DOB: 1956/03/30 MRN: [id number]   Date of last office visit: 10/23/24 with [facility] Date of next office visit: 01/23/25 with [facility]   Request for Surgical Clearance    Procedure:  not indicated - called office for clarification   Date of Surgery:  Clearance TBD                                 Surgeon:  Dr. Saintclair  Surgeon's Group or Practice Name:  [facility]  Phone number:  [phone] Fax number:  [phone]   Type of Clearance Requested:   - Medical  - Pharmacy:  Hold Apixaban  (Eliquis ) not indicated   Type of Anesthesia:  propofol    Additional requests/questions:    Kathryn [name]   12/15/2024, 3:24 PM   "

## 2024-12-28 ENCOUNTER — Telehealth: Payer: Self-pay | Admitting: Cardiology

## 2024-12-28 NOTE — Telephone Encounter (Signed)
 Husband is calling to see if the patient is suppose to stay on the medication that the patient received from the hospital. Please advise

## 2024-12-28 NOTE — Telephone Encounter (Signed)
 Spoke with pt's husband regarding medications. Husband stated the pt was discharged from the hospital with medications but is almost out of them. Husband mentioned only having a few tablets left of the metoprolol  and amiodarone . Husband also stated he will need assistance with paying for Eliquis . Husband was told that a message would be sent to [facility], NP regarding refills as the pt has an upcoming appointment with her on 3/3. A message will also be sent to our rx med assist team for help with Eliquis . Husband verbalized understanding. All questions if any were answered.

## 2024-12-29 MED ORDER — ELIQUIS 5 MG PO TABS: 5.0000 mg | ORAL_TABLET | Freq: Two times a day (BID) | ORAL | 3 refills | Status: AC

## 2024-12-29 MED ORDER — AMIODARONE HCL 200 MG PO TABS: 200.0000 mg | ORAL_TABLET | Freq: Every day | ORAL | 0 refills | Status: AC

## 2024-12-29 NOTE — Telephone Encounter (Signed)
 Aleck,    It does not look like EP saw her during the hospital admit.  I reviewed the last Cardiology note and it said to keep her on amiodarone  & Eliquis .       At this point it looks as if she need a new prescription for amiodarone  200 mg daily and refills on Eliquis  5mg  BID.     She should NOT be on Flecainide  and Amiodarone  at the same time. Please confirm she is not taking them together.      Thanks,  Daphne Barrack, NP-C, AGACNP-BC [facility] - Electrophysiology  12/29/2024, 7:51 AM  S/w the patient, she is not taking Flecainide , only taking Amiodarone .   Refills sent for Eliquis  and Amiodarone . She is aware of appt on 01/23/25.

## 2025-01-23 ENCOUNTER — Ambulatory Visit: Admitting: Pulmonary Disease
